# Patient Record
Sex: Male | Born: 1955 | Race: Black or African American | Hispanic: No | Marital: Single | Smoking: Former smoker
Health system: Southern US, Community
[De-identification: ages and names within clinical notes are randomized; demographics above are authoritative.]

## PROBLEM LIST (undated history)

## (undated) ENCOUNTER — Emergency Department (HOSPITAL_COMMUNITY): Admission: EM

## (undated) DIAGNOSIS — K219 Gastro-esophageal reflux disease without esophagitis: Secondary | ICD-10-CM

## (undated) DIAGNOSIS — G8929 Other chronic pain: Secondary | ICD-10-CM

## (undated) DIAGNOSIS — E785 Hyperlipidemia, unspecified: Secondary | ICD-10-CM

## (undated) DIAGNOSIS — R51 Headache: Secondary | ICD-10-CM

## (undated) DIAGNOSIS — E559 Vitamin D deficiency, unspecified: Secondary | ICD-10-CM

## (undated) DIAGNOSIS — M1990 Unspecified osteoarthritis, unspecified site: Secondary | ICD-10-CM

## (undated) DIAGNOSIS — I10 Essential (primary) hypertension: Secondary | ICD-10-CM

## (undated) DIAGNOSIS — E119 Type 2 diabetes mellitus without complications: Secondary | ICD-10-CM

## (undated) HISTORY — PX: LUMBAR FUSION: SHX111

## (undated) HISTORY — PX: BACK SURGERY: SHX140

## (undated) HISTORY — PX: ROTATOR CUFF REPAIR: SHX139

## (undated) HISTORY — DX: Vitamin D deficiency, unspecified: E55.9

## (undated) HISTORY — DX: Hyperlipidemia, unspecified: E78.5

---

## 1997-06-12 HISTORY — PX: CERVICAL DISC SURGERY: SHX588

## 1997-11-17 ENCOUNTER — Observation Stay (HOSPITAL_COMMUNITY): Admission: RE | Admit: 1997-11-17 | Discharge: 1997-11-18 | Payer: Self-pay | Admitting: Neurosurgery

## 1998-01-11 ENCOUNTER — Ambulatory Visit (HOSPITAL_COMMUNITY): Admission: RE | Admit: 1998-01-11 | Discharge: 1998-01-11 | Payer: Self-pay | Admitting: Neurosurgery

## 1998-04-30 ENCOUNTER — Encounter: Payer: Self-pay | Admitting: Neurosurgery

## 1998-04-30 ENCOUNTER — Ambulatory Visit (HOSPITAL_COMMUNITY): Admission: RE | Admit: 1998-04-30 | Discharge: 1998-04-30 | Payer: Self-pay | Admitting: Neurosurgery

## 1998-12-07 ENCOUNTER — Ambulatory Visit (HOSPITAL_COMMUNITY): Admission: RE | Admit: 1998-12-07 | Discharge: 1998-12-07 | Payer: Self-pay | Admitting: *Deleted

## 1998-12-16 ENCOUNTER — Ambulatory Visit (HOSPITAL_COMMUNITY): Admission: RE | Admit: 1998-12-16 | Discharge: 1998-12-16 | Payer: Self-pay | Admitting: *Deleted

## 1999-05-30 ENCOUNTER — Emergency Department (HOSPITAL_COMMUNITY): Admission: EM | Admit: 1999-05-30 | Discharge: 1999-05-30 | Payer: Self-pay | Admitting: Emergency Medicine

## 1999-05-31 ENCOUNTER — Encounter: Payer: Self-pay | Admitting: Emergency Medicine

## 1999-07-19 ENCOUNTER — Emergency Department (HOSPITAL_COMMUNITY): Admission: EM | Admit: 1999-07-19 | Discharge: 1999-07-19 | Payer: Self-pay | Admitting: Emergency Medicine

## 1999-10-24 ENCOUNTER — Emergency Department (HOSPITAL_COMMUNITY): Admission: EM | Admit: 1999-10-24 | Discharge: 1999-10-24 | Payer: Self-pay | Admitting: Emergency Medicine

## 2000-04-11 ENCOUNTER — Ambulatory Visit (HOSPITAL_COMMUNITY): Admission: RE | Admit: 2000-04-11 | Discharge: 2000-04-11 | Payer: Self-pay | Admitting: *Deleted

## 2000-10-06 ENCOUNTER — Emergency Department (HOSPITAL_COMMUNITY): Admission: EM | Admit: 2000-10-06 | Discharge: 2000-10-06 | Payer: Self-pay

## 2001-09-12 ENCOUNTER — Encounter: Payer: Self-pay | Admitting: Neurosurgery

## 2001-09-12 ENCOUNTER — Encounter: Admission: RE | Admit: 2001-09-12 | Discharge: 2001-09-12 | Payer: Self-pay | Admitting: Neurosurgery

## 2002-08-25 ENCOUNTER — Emergency Department (HOSPITAL_COMMUNITY): Admission: EM | Admit: 2002-08-25 | Discharge: 2002-08-25 | Payer: Self-pay | Admitting: Emergency Medicine

## 2002-12-06 ENCOUNTER — Emergency Department (HOSPITAL_COMMUNITY): Admission: EM | Admit: 2002-12-06 | Discharge: 2002-12-06 | Payer: Self-pay | Admitting: Emergency Medicine

## 2005-01-20 ENCOUNTER — Emergency Department (HOSPITAL_COMMUNITY): Admission: EM | Admit: 2005-01-20 | Discharge: 2005-01-20 | Payer: Self-pay | Admitting: Emergency Medicine

## 2006-10-03 ENCOUNTER — Emergency Department (HOSPITAL_COMMUNITY): Admission: EM | Admit: 2006-10-03 | Discharge: 2006-10-03 | Payer: Self-pay | Admitting: Family Medicine

## 2007-03-20 ENCOUNTER — Encounter: Admission: RE | Admit: 2007-03-20 | Discharge: 2007-03-20 | Payer: Self-pay | Admitting: Nephrology

## 2007-08-29 ENCOUNTER — Emergency Department (HOSPITAL_COMMUNITY): Admission: EM | Admit: 2007-08-29 | Discharge: 2007-08-29 | Payer: Self-pay | Admitting: Family Medicine

## 2008-05-23 ENCOUNTER — Emergency Department (HOSPITAL_COMMUNITY): Admission: EM | Admit: 2008-05-23 | Discharge: 2008-05-23 | Payer: Self-pay | Admitting: Emergency Medicine

## 2008-06-12 ENCOUNTER — Emergency Department (HOSPITAL_COMMUNITY): Admission: EM | Admit: 2008-06-12 | Discharge: 2008-06-12 | Payer: Self-pay | Admitting: Emergency Medicine

## 2009-06-30 ENCOUNTER — Encounter: Admission: RE | Admit: 2009-06-30 | Discharge: 2009-06-30 | Payer: Self-pay | Admitting: Nephrology

## 2010-04-07 ENCOUNTER — Emergency Department (HOSPITAL_COMMUNITY): Admission: EM | Admit: 2010-04-07 | Discharge: 2010-04-07 | Payer: Self-pay | Admitting: Emergency Medicine

## 2010-08-09 ENCOUNTER — Ambulatory Visit
Admission: RE | Admit: 2010-08-09 | Discharge: 2010-08-09 | Disposition: A | Discharge status: Home / Self Care | Payer: Medicare Other | Source: Ambulatory Visit | Attending: Nephrology | Admitting: Nephrology

## 2010-08-09 ENCOUNTER — Other Ambulatory Visit: Payer: Self-pay | Admitting: Nephrology

## 2010-08-09 DIAGNOSIS — F17200 Nicotine dependence, unspecified, uncomplicated: Secondary | ICD-10-CM

## 2011-03-07 ENCOUNTER — Ambulatory Visit
Admission: RE | Admit: 2011-03-07 | Discharge: 2011-03-07 | Disposition: A | Discharge status: Home / Self Care | Payer: Medicare Other | Source: Ambulatory Visit | Attending: Nephrology | Admitting: Nephrology

## 2011-03-07 ENCOUNTER — Other Ambulatory Visit: Payer: Self-pay | Admitting: Nephrology

## 2011-03-07 DIAGNOSIS — R52 Pain, unspecified: Secondary | ICD-10-CM

## 2011-10-31 ENCOUNTER — Other Ambulatory Visit: Payer: Self-pay | Admitting: Nephrology

## 2011-10-31 ENCOUNTER — Ambulatory Visit
Admission: RE | Admit: 2011-10-31 | Discharge: 2011-10-31 | Disposition: A | Discharge status: Home / Self Care | Payer: Medicare Other | Source: Ambulatory Visit | Attending: Nephrology | Admitting: Nephrology

## 2011-10-31 DIAGNOSIS — I10 Essential (primary) hypertension: Secondary | ICD-10-CM

## 2012-04-11 ENCOUNTER — Other Ambulatory Visit: Payer: Self-pay | Admitting: Neurosurgery

## 2012-04-11 DIAGNOSIS — M48061 Spinal stenosis, lumbar region without neurogenic claudication: Secondary | ICD-10-CM

## 2012-04-18 ENCOUNTER — Ambulatory Visit
Admission: RE | Admit: 2012-04-18 | Discharge: 2012-04-18 | Disposition: A | Discharge status: Home / Self Care | Payer: Medicare Other | Source: Ambulatory Visit | Attending: Neurosurgery | Admitting: Neurosurgery

## 2012-04-18 DIAGNOSIS — M48061 Spinal stenosis, lumbar region without neurogenic claudication: Secondary | ICD-10-CM

## 2012-08-07 ENCOUNTER — Other Ambulatory Visit: Payer: Self-pay | Admitting: Neurosurgery

## 2012-08-07 ENCOUNTER — Encounter (HOSPITAL_COMMUNITY): Payer: Self-pay | Admitting: Pharmacy Technician

## 2012-08-12 ENCOUNTER — Encounter (HOSPITAL_COMMUNITY): Payer: Self-pay

## 2012-08-12 ENCOUNTER — Encounter (HOSPITAL_COMMUNITY): Payer: Self-pay | Admitting: Vascular Surgery

## 2012-08-12 ENCOUNTER — Encounter (HOSPITAL_COMMUNITY)
Admission: RE | Admit: 2012-08-12 | Discharge: 2012-08-12 | Disposition: A | Discharge status: Home / Self Care | Payer: Medicare Other | Source: Ambulatory Visit | Attending: Neurosurgery | Admitting: Neurosurgery

## 2012-08-12 DIAGNOSIS — Z01810 Encounter for preprocedural cardiovascular examination: Secondary | ICD-10-CM | POA: Insufficient documentation

## 2012-08-12 DIAGNOSIS — Z01818 Encounter for other preprocedural examination: Secondary | ICD-10-CM | POA: Insufficient documentation

## 2012-08-12 DIAGNOSIS — Z01812 Encounter for preprocedural laboratory examination: Secondary | ICD-10-CM | POA: Insufficient documentation

## 2012-08-12 HISTORY — DX: Essential (primary) hypertension: I10

## 2012-08-12 HISTORY — DX: Unspecified osteoarthritis, unspecified site: M19.90

## 2012-08-12 HISTORY — DX: Type 2 diabetes mellitus without complications: E11.9

## 2012-08-12 HISTORY — DX: Headache: R51

## 2012-08-12 LAB — CBC WITH DIFFERENTIAL/PLATELET
Basophils Absolute: 0 10*3/uL (ref 0.0–0.1)
Basophils Relative: 1 % (ref 0–1)
Eosinophils Absolute: 0.2 10*3/uL (ref 0.0–0.7)
Eosinophils Relative: 3 % (ref 0–5)
HCT: 43 % (ref 39.0–52.0)
Hemoglobin: 14.8 g/dL (ref 13.0–17.0)
Lymphocytes Relative: 23 % (ref 12–46)
Lymphs Abs: 1.7 10*3/uL (ref 0.7–4.0)
MCH: 29.9 pg (ref 26.0–34.0)
MCHC: 34.4 g/dL (ref 30.0–36.0)
MCV: 86.9 fL (ref 78.0–100.0)
Monocytes Absolute: 0.6 10*3/uL (ref 0.1–1.0)
Monocytes Relative: 8 % (ref 3–12)
Neutro Abs: 4.9 10*3/uL (ref 1.7–7.7)
Neutrophils Relative %: 66 % (ref 43–77)
Platelets: 164 10*3/uL (ref 150–400)
RBC: 4.95 MIL/uL (ref 4.22–5.81)
RDW: 12.7 % (ref 11.5–15.5)
WBC: 7.3 10*3/uL (ref 4.0–10.5)

## 2012-08-12 LAB — BASIC METABOLIC PANEL
BUN: 13 mg/dL (ref 6–23)
CO2: 29 mEq/L (ref 19–32)
Calcium: 9.7 mg/dL (ref 8.4–10.5)
Chloride: 94 mEq/L — ABNORMAL LOW (ref 96–112)
Creatinine, Ser: 0.92 mg/dL (ref 0.50–1.35)
GFR calc Af Amer: >90 mL/min (ref >90)
GFR calc non Af Amer: >90 mL/min (ref >90)
Glucose, Bld: 338 mg/dL — ABNORMAL HIGH (ref 70–99)
Potassium: 3.5 mEq/L (ref 3.5–5.1)
Sodium: 135 mEq/L (ref 135–145)

## 2012-08-12 LAB — SURGICAL PCR SCREEN
MRSA, PCR: NEGATIVE
Staphylococcus aureus: NEGATIVE

## 2012-08-12 MED ORDER — DEXAMETHASONE SODIUM PHOSPHATE 10 MG/ML IJ SOLN: 10.0000 mg | INTRAMUSCULAR | Status: DC

## 2012-08-12 MED ORDER — CEFAZOLIN SODIUM-DEXTROSE 2-3 GM-% IV SOLR: 2.0000 g | INTRAVENOUS | Status: DC

## 2012-08-12 NOTE — Pre-Procedure Instructions (Addendum)
Riley Clarke  08/12/2012   Your procedure is scheduled on:  Tuesday August 13, 2012  Report to [facility] Stay [facility] at 5:30AM.  Call this number if you have problems the morning of surgery: [phone]   Remember:   Do not eat food or drink liquids after midnight.   Take these medicines the morning of surgery with A SIP OF WATER: nexium, tramadol   Do not wear jewelry, make-up or nail polish.  Do not wear lotions, powders, or perfumes.  Do not shave 48 hours prior to surgery. Men may shave face and neck.  Do not bring valuables to the hospital.  Contacts, dentures or bridgework may not be worn into surgery.  Leave suitcase in the car. After surgery it may be brought to your room.  For patients admitted to the hospital, checkout time is 11:00 AM the day of  discharge.   Patients discharged the day of surgery will not be allowed to drive  home.  Name and phone number of your driver: family / friend  Special Instructions: Shower using CHG 2 nights before surgery and the night before surgery.  If you shower the day of surgery use CHG.  Use special wash - you have one bottle of CHG for all showers.  You should use approximately 1/3 of the bottle for each shower.   Please read over the following fact sheets that you were given: Pain Booklet, Coughing and Deep Breathing, Blood Transfusion Information, MRSA Information and Surgical Site Infection Prevention

## 2012-08-12 NOTE — Progress Notes (Signed)
Anesthesia Chart Review:  Patient is a 57 year old male posted for a one level PLIF by Dr. Jordan Likes on 08/13/12.  His PAT appointment was earlier today.  (Procedure was posted last week.)  I was not asked to evaluate patient at his PAT visit, but was given his EKG to review after he had already left his PAT visit.  History includes HTN, DM2, obesity, smoking, arthritis, cervical disk surgery.  PCP is Dr. Jeri Cos, whose office is closed for the day.    Preoperative labs noted.  Non-fasting glucose is elevated at 338.  Cr 0.92.  CBC WNL.  T&S is in process.  CXR on 10/31/11 showed no acute cardiopulmonary findings.  EKG on 08/12/12 showed NSR, inferior T wave abnormality, consider ischemia--which was confirmed by the reading cardiologist Dr. Mayford Knife.  (He is not followed by a cardiologist, however.) Currently there are no comparison EKGs available in Epic or Muse.  His PCP office is closed today.  I reviewed history and EKG with Anesthesiologist Dr. Michelle Piper.  Recommend cardiology evaluation pre-operatively due to abnormal EKG and multiple risk factors for CAD.  I have notified Erie Noe at [facility].  I also told her his glucose was > 300 today.  She will let me know if she finds out any additional information, otherwise she will arrange a pre-operative cardiology evaluation.  Shonna Chock, PA-C 08/12/12 1409

## 2012-08-13 ENCOUNTER — Encounter (HOSPITAL_COMMUNITY): Admission: RE | Payer: Self-pay | Source: Ambulatory Visit

## 2012-08-13 ENCOUNTER — Inpatient Hospital Stay (HOSPITAL_COMMUNITY): Admission: RE | Admit: 2012-08-13 | Payer: Medicare Other | Source: Ambulatory Visit | Admitting: Neurosurgery

## 2012-08-13 SURGERY — POSTERIOR LUMBAR FUSION 1 LEVEL
Anesthesia: General | Site: Back

## 2012-08-16 LAB — TYPE AND SCREEN
ABO/RH(D): AB POS
Antibody Screen: POSITIVE
DAT, IgG: NEGATIVE
Donor AG Type: NEGATIVE
Donor AG Type: NEGATIVE
PT AG Type: NEGATIVE
Unit division: 0
Unit division: 0

## 2012-08-21 ENCOUNTER — Other Ambulatory Visit (HOSPITAL_COMMUNITY): Payer: Self-pay | Admitting: Cardiovascular Disease

## 2012-08-21 DIAGNOSIS — Z01818 Encounter for other preprocedural examination: Secondary | ICD-10-CM

## 2012-08-23 ENCOUNTER — Ambulatory Visit (HOSPITAL_COMMUNITY)
Admission: RE | Admit: 2012-08-23 | Discharge: 2012-08-23 | Disposition: A | Discharge status: Home / Self Care | Payer: Medicare Other | Source: Ambulatory Visit | Attending: Cardiovascular Disease | Admitting: Cardiovascular Disease

## 2012-08-23 DIAGNOSIS — Z01818 Encounter for other preprocedural examination: Secondary | ICD-10-CM

## 2012-08-23 DIAGNOSIS — Z01810 Encounter for preprocedural cardiovascular examination: Secondary | ICD-10-CM | POA: Insufficient documentation

## 2012-08-23 MED ORDER — TECHNETIUM TC 99M SESTAMIBI GENERIC - CARDIOLITE
10.0000 | Freq: Once | INTRAVENOUS | Status: AC | PRN
  Administered 2012-08-23: 10 via INTRAVENOUS

## 2012-08-23 MED ORDER — TECHNETIUM TC 99M SESTAMIBI GENERIC - CARDIOLITE
30.0000 | Freq: Once | INTRAVENOUS | Status: AC | PRN
  Administered 2012-08-23: 30 via INTRAVENOUS

## 2012-08-23 MED ORDER — REGADENOSON 0.4 MG/5ML IV SOLN
0.4000 mg | Freq: Once | INTRAVENOUS | Status: AC
  Administered 2012-08-23: 0.4 mg via INTRAVENOUS

## 2012-08-23 NOTE — Procedures (Addendum)
[facility] [address] [place] [address] [phone]  Cardiology Nuclear Med Study  Riley Clarke is a 57 y.o. male     MRN : [id number]     DOB: 30-Dec-1955  Procedure Date: 08/23/2012  Nuclear Med Background Indication for Stress Test:  Surgical Clearance History:  NO PRIOR CARDIAC HISTORY Cardiac Risk Factors: Family History - CAD, Hypertension, NIDDM, Overweight and Smoker  Symptoms:  Dizziness and Light-Headedness   Nuclear Pre-Procedure Caffeine/Decaff Intake:  12:00am NPO After: 10 AM   IV Site: R Hand  IV 0.9% NS with Angio Cath:  22g  Chest Size (in):  46" IV Started by: Emmit Pomfret, RN  Height: 6\' 1"  (1.854 m)  Cup Size: n/a  BMI:  Body mass index is 32.2 kg/(m^2). Weight:  244 lb (110.678 kg)   Tech Comments:  N/A    Nuclear Med Study 1 or 2 day study: 1 day  Stress Test Type:  Lexiscan  Order Authorizing Provider:  Obie Dredge   Resting Radionuclide: Technetium 4m Sestamibi  Resting Radionuclide Dose: 10.9 mCi   Stress Radionuclide:  Technetium 37m Sestamibi  Stress Radionuclide Dose: 30.8 mCi           Stress Protocol Rest HR: 70 Stress HR:105  Rest BP: 144/91 Stress BP: 127/71  Exercise Time (min): n/a METS: n/a          Dose of Adenosine (mg):  n/a Dose of Lexiscan: 0.4 mg  Dose of Atropine (mg): n/a Dose of Dobutamine: n/a mcg/kg/min (at max HR)  Stress Test Technologist: Ernestene Mention, CCT Nuclear Technologist: Koren Shiver, CNMT   Rest Procedure:  Myocardial perfusion imaging was performed at rest 45 minutes following the intravenous administration of Technetium 53m Sestamibi. Stress Procedure:  The patient received IV Lexiscan 0.4 mg over 15-seconds.  Technetium 50m Sestamibi injected at 30-seconds.  There were no significant changes with Lexiscan.  Quantitative spect images were obtained after a 45 minute delay.  Transient Ischemic Dilatation (Normal <1.22):   1.20 Lung/Heart Ratio (Normal <0.45):  0.31 QGS EDV:  81 ml QGS ESV:  32 ml LV Ejection Fraction: 61%  Rest ECG: NSR - Normal EKG  Stress ECG: No significant change from baseline ECG  QPS Raw Data Images:  There is interference from nuclear activity from structures below the diaphragm. This does not affect the ability to read the study. Stress Images:  Normal homogeneous uptake in all areas of the myocardium. Rest Images:  Normal homogeneous uptake in all areas of the myocardium. Subtraction (SDS):  No evidence of ischemia.  Impression Exercise Capacity:  Lexiscan with no exercise. BP Response:  Hypotensive blood pressure response. Clinical Symptoms:  Mild dyspnea ECG Impression:  No significant ECG changes with Lexiscan. Comparison with Prior Nuclear Study: No previous nuclear study performed  Overall Impression:  Normal stress nuclear study.  LV Wall Motion:  NL LV Function; NL Wall Motion  Chrystie Nose, MD, [facility] Board Certified in Nuclear Cardiology Attending Cardiologist [facility]  Chrystie Nose, MD  08/24/2012 2:41 PM

## 2012-09-02 ENCOUNTER — Other Ambulatory Visit: Payer: Self-pay | Admitting: Neurosurgery

## 2012-09-02 ENCOUNTER — Encounter (HOSPITAL_COMMUNITY): Payer: Self-pay | Admitting: Pharmacy Technician

## 2012-09-05 NOTE — Progress Notes (Signed)
Anesthesia Note: See my note from 08/12/12.  Since then patient's PLIF has been rescheduled for 09/10/12.  He has seen cardiologist Dr. Allyson Sabal [facility]) who ordered a stress test preoperatively.  He felt patient could have PRN cardiology follow-up and be cleared for surgery if it was low risk--which it was.  Nuclear stress test on 08/25/11 showed: Normal stress nuclear study. LV Wall Motion: NL LV Function; EF 61%. NL Wall Motion.  Preoperative labs are pending his PAT appointment on 09/06/12.  Velna Ochs [facility]/Anesthesiology Phone [phone] 09/05/2012 2:06 PM

## 2012-09-06 ENCOUNTER — Institutional Professional Consult (permissible substitution): Payer: Medicare Other | Admitting: Cardiology

## 2012-09-06 ENCOUNTER — Encounter (HOSPITAL_COMMUNITY): Payer: Self-pay

## 2012-09-06 ENCOUNTER — Encounter (HOSPITAL_COMMUNITY)
Admission: RE | Admit: 2012-09-06 | Discharge: 2012-09-06 | Disposition: A | Discharge status: Home / Self Care | Payer: Medicare Other | Source: Ambulatory Visit | Attending: Neurosurgery | Admitting: Neurosurgery

## 2012-09-06 HISTORY — DX: Gastro-esophageal reflux disease without esophagitis: K21.9

## 2012-09-06 LAB — BASIC METABOLIC PANEL
BUN: 14 mg/dL (ref 6–23)
CO2: 22 mEq/L (ref 19–32)
Calcium: 9.2 mg/dL (ref 8.4–10.5)
Chloride: 95 mEq/L — ABNORMAL LOW (ref 96–112)
Creatinine, Ser: 0.74 mg/dL (ref 0.50–1.35)
GFR calc Af Amer: >90 mL/min (ref >90)
GFR calc non Af Amer: >90 mL/min (ref >90)
Glucose, Bld: 380 mg/dL — ABNORMAL HIGH (ref 70–99)
Potassium: 3.9 mEq/L (ref 3.5–5.1)
Sodium: 131 mEq/L — ABNORMAL LOW (ref 135–145)

## 2012-09-06 LAB — CBC WITH DIFFERENTIAL/PLATELET
Basophils Absolute: 0 10*3/uL (ref 0.0–0.1)
Basophils Relative: 0 % (ref 0–1)
Eosinophils Absolute: 0.2 10*3/uL (ref 0.0–0.7)
Eosinophils Relative: 2 % (ref 0–5)
HCT: 41.7 % (ref 39.0–52.0)
Hemoglobin: 14.6 g/dL (ref 13.0–17.0)
Lymphocytes Relative: 24 % (ref 12–46)
Lymphs Abs: 2 10*3/uL (ref 0.7–4.0)
MCH: 30.5 pg (ref 26.0–34.0)
MCHC: 35 g/dL (ref 30.0–36.0)
MCV: 87.1 fL (ref 78.0–100.0)
Monocytes Absolute: 0.5 10*3/uL (ref 0.1–1.0)
Monocytes Relative: 6 % (ref 3–12)
Neutro Abs: 5.9 10*3/uL (ref 1.7–7.7)
Neutrophils Relative %: 68 % (ref 43–77)
Platelets: 177 10*3/uL (ref 150–400)
RBC: 4.79 MIL/uL (ref 4.22–5.81)
RDW: 12.4 % (ref 11.5–15.5)
WBC: 8.7 10*3/uL (ref 4.0–10.5)

## 2012-09-06 NOTE — Progress Notes (Signed)
Primary Physician - Dr. Jeri Cos Cardiologist - Dr. Allyson Sabal EKG and stress test in chart

## 2012-09-06 NOTE — Pre-Procedure Instructions (Signed)
Fount Bahe Maricle  09/06/2012   Your procedure is scheduled on:  Tuesday, April 1st  Report to [facility] at 0845 AM.  Call this number if you have problems the morning of surgery: [phone]   Remember:   Do not eat food or drink liquids after midnight.    Take these medicines the morning of surgery with A SIP OF WATER: Nexium, Ultram if needed   Do not wear jewelry.  Do not wear lotions, powders, or perfumes,deodorant.  Do not shave 48 hours prior to surgery. Men may shave face and neck.   Do not bring valuables to the hospital.  Contacts, dentures or bridgework may not be worn into surgery.  Leave suitcase in the car. After surgery it may be brought to your room.  For patients admitted to the hospital, checkout time is 11:00 AM the day of discharge.   Patients discharged the day of surgery will not be allowed to drive home.   Special Instructions: Shower using CHG 2 nights before surgery and the night before surgery.  If you shower the day of surgery use CHG.  Use special wash - you have one bottle of CHG for all showers.  You should use approximately 1/3 of the bottle for each shower.   Please read over the following fact sheets that you were given: Pain Booklet, Coughing and Deep Breathing, Blood Transfusion Information, MRSA Information and Surgical Site Infection Prevention

## 2012-09-09 MED ORDER — CEFAZOLIN SODIUM-DEXTROSE 2-3 GM-% IV SOLR
2.0000 g | INTRAVENOUS | Status: AC
  Administered 2012-09-10: 2 g via INTRAVENOUS

## 2012-09-10 ENCOUNTER — Encounter (HOSPITAL_COMMUNITY): Payer: Self-pay | Admitting: Anesthesiology

## 2012-09-10 ENCOUNTER — Inpatient Hospital Stay (HOSPITAL_COMMUNITY)
Admission: RE | Admit: 2012-09-10 | Discharge: 2012-09-12 | DRG: 460 | Disposition: A | Discharge status: Home / Self Care | Payer: Medicare Other | Source: Ambulatory Visit | Attending: Neurosurgery | Admitting: Neurosurgery

## 2012-09-10 ENCOUNTER — Encounter (HOSPITAL_COMMUNITY): Payer: Self-pay | Admitting: Vascular Surgery

## 2012-09-10 ENCOUNTER — Encounter (HOSPITAL_COMMUNITY)
Admission: RE | Disposition: A | Discharge status: Home / Self Care | Payer: Self-pay | Source: Ambulatory Visit | Attending: Neurosurgery

## 2012-09-10 ENCOUNTER — Inpatient Hospital Stay (HOSPITAL_COMMUNITY): Payer: Medicare Other

## 2012-09-10 ENCOUNTER — Inpatient Hospital Stay (HOSPITAL_COMMUNITY): Payer: Medicare Other | Admitting: Anesthesiology

## 2012-09-10 DIAGNOSIS — I10 Essential (primary) hypertension: Secondary | ICD-10-CM | POA: Diagnosis present

## 2012-09-10 DIAGNOSIS — K219 Gastro-esophageal reflux disease without esophagitis: Secondary | ICD-10-CM | POA: Diagnosis present

## 2012-09-10 DIAGNOSIS — Z01812 Encounter for preprocedural laboratory examination: Secondary | ICD-10-CM

## 2012-09-10 DIAGNOSIS — F17200 Nicotine dependence, unspecified, uncomplicated: Secondary | ICD-10-CM | POA: Diagnosis present

## 2012-09-10 DIAGNOSIS — M48061 Spinal stenosis, lumbar region without neurogenic claudication: Secondary | ICD-10-CM | POA: Diagnosis present

## 2012-09-10 DIAGNOSIS — Z79899 Other long term (current) drug therapy: Secondary | ICD-10-CM

## 2012-09-10 DIAGNOSIS — E119 Type 2 diabetes mellitus without complications: Secondary | ICD-10-CM | POA: Diagnosis present

## 2012-09-10 DIAGNOSIS — M48062 Spinal stenosis, lumbar region with neurogenic claudication: Principal | ICD-10-CM | POA: Diagnosis present

## 2012-09-10 LAB — GLUCOSE, CAPILLARY
Glucose-Capillary: 281 mg/dL — ABNORMAL HIGH (ref 70–99)
Glucose-Capillary: 280 mg/dL — ABNORMAL HIGH (ref 70–99)
Glucose-Capillary: 270 mg/dL — ABNORMAL HIGH (ref 70–99)
Glucose-Capillary: 229 mg/dL — ABNORMAL HIGH (ref 70–99)

## 2012-09-10 SURGERY — POSTERIOR LUMBAR FUSION 2 LEVEL
Anesthesia: General | Site: Back | Wound class: Clean

## 2012-09-10 MED ORDER — HYDROCHLOROTHIAZIDE 25 MG PO TABS
25.0000 mg | ORAL_TABLET | Freq: Every day | ORAL | Status: DC
Start: 2012-09-10 — End: 2012-09-12
  Administered 2012-09-10 – 2012-09-12 (×3): 25 mg via ORAL
  Filled 2012-09-10 (×3): qty 1

## 2012-09-10 MED ORDER — HYDROMORPHONE HCL PF 1 MG/ML IJ SOLN
INTRAMUSCULAR | Status: AC
  Filled 2012-09-10: qty 1

## 2012-09-10 MED ORDER — DEXAMETHASONE SODIUM PHOSPHATE 10 MG/ML IJ SOLN: 10.0000 mg | INTRAMUSCULAR | Status: DC

## 2012-09-10 MED ORDER — HYDROCODONE-ACETAMINOPHEN 5-325 MG PO TABS: 1.0000 | ORAL_TABLET | ORAL | Status: DC | PRN

## 2012-09-10 MED ORDER — BUPIVACAINE HCL (PF) 0.25 % IJ SOLN
INTRAMUSCULAR | Status: DC | PRN
  Administered 2012-09-10: 30 mL

## 2012-09-10 MED ORDER — MENTHOL 3 MG MT LOZG: 1.0000 | LOZENGE | OROMUCOSAL | Status: DC | PRN

## 2012-09-10 MED ORDER — ROCURONIUM BROMIDE 100 MG/10ML IV SOLN
INTRAVENOUS | Status: DC | PRN
  Administered 2012-09-10: 50 mg via INTRAVENOUS

## 2012-09-10 MED ORDER — CEFAZOLIN SODIUM-DEXTROSE 2-3 GM-% IV SOLR
INTRAVENOUS | Status: AC
  Filled 2012-09-10: qty 50

## 2012-09-10 MED ORDER — DEXAMETHASONE SODIUM PHOSPHATE 10 MG/ML IJ SOLN
INTRAMUSCULAR | Status: AC
  Filled 2012-09-10: qty 1

## 2012-09-10 MED ORDER — POLYETHYLENE GLYCOL 3350 17 G PO PACK
17.0000 g | PACK | Freq: Every day | ORAL | Status: DC | PRN
  Filled 2012-09-10: qty 1

## 2012-09-10 MED ORDER — HYDROMORPHONE HCL PF 1 MG/ML IJ SOLN
0.5000 mg | INTRAMUSCULAR | Status: DC | PRN
  Administered 2012-09-10: 1 mg via INTRAVENOUS
  Filled 2012-09-10: qty 1

## 2012-09-10 MED ORDER — FENTANYL CITRATE 0.05 MG/ML IJ SOLN
INTRAMUSCULAR | Status: DC | PRN
  Administered 2012-09-10 (×6): 50 ug via INTRAVENOUS
  Administered 2012-09-10: 100 ug via INTRAVENOUS
  Administered 2012-09-10: 150 ug via INTRAVENOUS

## 2012-09-10 MED ORDER — LOSARTAN POTASSIUM-HCTZ 100-25 MG PO TABS: 1.0000 | ORAL_TABLET | Freq: Every day | ORAL | Status: DC

## 2012-09-10 MED ORDER — INSULIN ASPART 100 UNIT/ML ~~LOC~~ SOLN
0.0000 [IU] | Freq: Three times a day (TID) | SUBCUTANEOUS | Status: DC
  Administered 2012-09-10: 11 [IU] via SUBCUTANEOUS
  Administered 2012-09-11: 7 [IU] via SUBCUTANEOUS
  Administered 2012-09-11: 15 [IU] via SUBCUTANEOUS
  Administered 2012-09-11 – 2012-09-12 (×3): 11 [IU] via SUBCUTANEOUS

## 2012-09-10 MED ORDER — DIAZEPAM 5 MG PO TABS
5.0000 mg | ORAL_TABLET | Freq: Four times a day (QID) | ORAL | Status: DC | PRN
  Administered 2012-09-10 – 2012-09-12 (×5): 10 mg via ORAL
  Administered 2012-09-12: 5 mg via ORAL
  Filled 2012-09-10 (×3): qty 2
  Filled 2012-09-10: qty 1
  Filled 2012-09-10 (×2): qty 2

## 2012-09-10 MED ORDER — ACETAMINOPHEN 325 MG PO TABS: 650.0000 mg | ORAL_TABLET | ORAL | Status: DC | PRN

## 2012-09-10 MED ORDER — LACTATED RINGERS IV SOLN
INTRAVENOUS | Status: DC | PRN
  Administered 2012-09-10 (×3): via INTRAVENOUS

## 2012-09-10 MED ORDER — SODIUM CHLORIDE 0.9 % IV SOLN
INTRAVENOUS | Status: AC
  Filled 2012-09-10: qty 500

## 2012-09-10 MED ORDER — ONDANSETRON HCL 4 MG/2ML IJ SOLN: 4.0000 mg | Freq: Once | INTRAMUSCULAR | Status: DC | PRN

## 2012-09-10 MED ORDER — ALUM & MAG HYDROXIDE-SIMETH 200-200-20 MG/5ML PO SUSP: 30.0000 mL | Freq: Four times a day (QID) | ORAL | Status: DC | PRN

## 2012-09-10 MED ORDER — MEPERIDINE HCL 25 MG/ML IJ SOLN: 6.2500 mg | INTRAMUSCULAR | Status: DC | PRN

## 2012-09-10 MED ORDER — BISACODYL 10 MG RE SUPP: 10.0000 mg | Freq: Every day | RECTAL | Status: DC | PRN

## 2012-09-10 MED ORDER — ONDANSETRON HCL 4 MG/2ML IJ SOLN: 4.0000 mg | INTRAMUSCULAR | Status: DC | PRN

## 2012-09-10 MED ORDER — INSULIN ASPART 100 UNIT/ML ~~LOC~~ SOLN: 0.0000 [IU] | Freq: Three times a day (TID) | SUBCUTANEOUS | Status: DC

## 2012-09-10 MED ORDER — 0.9 % SODIUM CHLORIDE (POUR BTL) OPTIME
TOPICAL | Status: DC | PRN
  Administered 2012-09-10: 1000 mL

## 2012-09-10 MED ORDER — OXYCODONE HCL 5 MG/5ML PO SOLN: 5.0000 mg | Freq: Once | ORAL | Status: DC | PRN

## 2012-09-10 MED ORDER — FLEET ENEMA 7-19 GM/118ML RE ENEM
1.0000 | ENEMA | Freq: Once | RECTAL | Status: AC | PRN
  Filled 2012-09-10: qty 1

## 2012-09-10 MED ORDER — LIDOCAINE HCL (CARDIAC) 20 MG/ML IV SOLN
INTRAVENOUS | Status: DC | PRN
  Administered 2012-09-10: 100 mg via INTRAVENOUS

## 2012-09-10 MED ORDER — HYDROMORPHONE HCL PF 1 MG/ML IJ SOLN
0.2500 mg | INTRAMUSCULAR | Status: DC | PRN
  Administered 2012-09-10 (×4): 0.5 mg via INTRAVENOUS

## 2012-09-10 MED ORDER — OXYCODONE-ACETAMINOPHEN 5-325 MG PO TABS
1.0000 | ORAL_TABLET | ORAL | Status: DC | PRN
  Administered 2012-09-10 – 2012-09-12 (×9): 2 via ORAL
  Filled 2012-09-10 (×9): qty 2

## 2012-09-10 MED ORDER — LOSARTAN POTASSIUM 50 MG PO TABS
100.0000 mg | ORAL_TABLET | Freq: Every day | ORAL | Status: DC
  Administered 2012-09-10 – 2012-09-12 (×3): 100 mg via ORAL
  Filled 2012-09-10 (×3): qty 2

## 2012-09-10 MED ORDER — PHENOL 1.4 % MT LIQD: 1.0000 | OROMUCOSAL | Status: DC | PRN

## 2012-09-10 MED ORDER — THROMBIN 20000 UNITS EX SOLR
CUTANEOUS | Status: DC | PRN
  Administered 2012-09-10: 13:00:00 via TOPICAL

## 2012-09-10 MED ORDER — PROPOFOL 10 MG/ML IV BOLUS
INTRAVENOUS | Status: DC | PRN
  Administered 2012-09-10: 200 mg via INTRAVENOUS
  Administered 2012-09-10: 50 mg via INTRAVENOUS

## 2012-09-10 MED ORDER — ARTIFICIAL TEARS OP OINT
TOPICAL_OINTMENT | OPHTHALMIC | Status: DC | PRN
  Administered 2012-09-10: 1 via OPHTHALMIC

## 2012-09-10 MED ORDER — PANTOPRAZOLE SODIUM 40 MG PO TBEC
80.0000 mg | DELAYED_RELEASE_TABLET | Freq: Every day | ORAL | Status: DC
  Administered 2012-09-11: 80 mg via ORAL
  Filled 2012-09-10: qty 2

## 2012-09-10 MED ORDER — BACITRACIN 50000 UNITS IM SOLR
INTRAMUSCULAR | Status: AC
  Filled 2012-09-10: qty 1

## 2012-09-10 MED ORDER — ACETAMINOPHEN 650 MG RE SUPP: 650.0000 mg | RECTAL | Status: DC | PRN

## 2012-09-10 MED ORDER — ACETAMINOPHEN 10 MG/ML IV SOLN
1000.0000 mg | Freq: Once | INTRAVENOUS | Status: AC
  Administered 2012-09-10: 1000 mg via INTRAVENOUS

## 2012-09-10 MED ORDER — NEOSTIGMINE METHYLSULFATE 1 MG/ML IJ SOLN
INTRAMUSCULAR | Status: DC | PRN
  Administered 2012-09-10: 4 mg via INTRAVENOUS

## 2012-09-10 MED ORDER — SODIUM CHLORIDE 0.9 % IJ SOLN: 3.0000 mL | INTRAMUSCULAR | Status: DC | PRN

## 2012-09-10 MED ORDER — ACETAMINOPHEN 10 MG/ML IV SOLN
INTRAVENOUS | Status: AC
  Filled 2012-09-10: qty 100

## 2012-09-10 MED ORDER — LINAGLIPTIN 5 MG PO TABS
5.0000 mg | ORAL_TABLET | Freq: Every day | ORAL | Status: DC
  Administered 2012-09-10 – 2012-09-12 (×3): 5 mg via ORAL
  Filled 2012-09-10 (×3): qty 1

## 2012-09-10 MED ORDER — SENNA 8.6 MG PO TABS
1.0000 | ORAL_TABLET | Freq: Two times a day (BID) | ORAL | Status: DC
  Administered 2012-09-10 – 2012-09-12 (×4): 8.6 mg via ORAL
  Filled 2012-09-10 (×5): qty 1

## 2012-09-10 MED ORDER — GLYCOPYRROLATE 0.2 MG/ML IJ SOLN
INTRAMUSCULAR | Status: DC | PRN
  Administered 2012-09-10: .7 mg via INTRAVENOUS

## 2012-09-10 MED ORDER — ZOLPIDEM TARTRATE 5 MG PO TABS: 5.0000 mg | ORAL_TABLET | Freq: Every evening | ORAL | Status: DC | PRN

## 2012-09-10 MED ORDER — CEFAZOLIN SODIUM 1-5 GM-% IV SOLN
1.0000 g | Freq: Three times a day (TID) | INTRAVENOUS | Status: AC
  Administered 2012-09-10 – 2012-09-11 (×2): 1 g via INTRAVENOUS
  Filled 2012-09-10 (×2): qty 50

## 2012-09-10 MED ORDER — SODIUM CHLORIDE 0.9 % IJ SOLN
3.0000 mL | Freq: Two times a day (BID) | INTRAMUSCULAR | Status: DC
  Administered 2012-09-11 – 2012-09-12 (×3): 3 mL via INTRAVENOUS

## 2012-09-10 MED ORDER — OXYCODONE HCL 5 MG PO TABS: 5.0000 mg | ORAL_TABLET | Freq: Once | ORAL | Status: DC | PRN

## 2012-09-10 MED ORDER — SODIUM CHLORIDE 0.9 % IR SOLN
Status: DC | PRN
  Administered 2012-09-10: 13:00:00

## 2012-09-10 MED ORDER — VECURONIUM BROMIDE 10 MG IV SOLR
INTRAVENOUS | Status: DC | PRN
  Administered 2012-09-10: 4 mg via INTRAVENOUS
  Administered 2012-09-10: 2 mg via INTRAVENOUS
  Administered 2012-09-10: 3 mg via INTRAVENOUS
  Administered 2012-09-10: 2 mg via INTRAVENOUS

## 2012-09-10 MED ORDER — SULINDAC 200 MG PO TABS
200.0000 mg | ORAL_TABLET | Freq: Two times a day (BID) | ORAL | Status: DC
  Administered 2012-09-10 – 2012-09-12 (×4): 200 mg via ORAL
  Filled 2012-09-10 (×5): qty 1

## 2012-09-10 MED ORDER — ONDANSETRON HCL 4 MG/2ML IJ SOLN
INTRAMUSCULAR | Status: DC | PRN
  Administered 2012-09-10: 4 mg via INTRAVENOUS

## 2012-09-10 MED ORDER — MIDAZOLAM HCL 5 MG/5ML IJ SOLN
INTRAMUSCULAR | Status: DC | PRN
  Administered 2012-09-10: 2 mg via INTRAVENOUS

## 2012-09-10 SURGICAL SUPPLY — 69 items
ADH SKN CLS APL DERMABOND .7 (GAUZE/BANDAGES/DRESSINGS) ×1
APL SKNCLS STERI-STRIP NONHPOA (GAUZE/BANDAGES/DRESSINGS) ×1
BAG DECANTER FOR FLEXI CONT (MISCELLANEOUS) ×2 IMPLANT
BENZOIN TINCTURE PRP APPL 2/3 (GAUZE/BANDAGES/DRESSINGS) ×2 IMPLANT
BLADE SURG ROTATE 9660 (MISCELLANEOUS) IMPLANT
BRUSH SCRUB EZ PLAIN DRY (MISCELLANEOUS) ×2 IMPLANT
BUR MATCHSTICK NEURO 3.0 LAGG (BURR) ×2 IMPLANT
CAGE 10X22 (Cage) ×2 IMPLANT
CANISTER SUCTION 2500CC (MISCELLANEOUS) ×2 IMPLANT
CAP LCK SPNE (Orthopedic Implant) ×6 IMPLANT
CAP LOCK SPINE RADIUS (Orthopedic Implant) IMPLANT
CAP LOCKING (Orthopedic Implant) ×12 IMPLANT
CLOTH BEACON ORANGE TIMEOUT ST (SAFETY) ×2 IMPLANT
CONT SPEC 4OZ CLIKSEAL STRL BL (MISCELLANEOUS) ×4 IMPLANT
COVER BACK TABLE 24X17X13 BIG (DRAPES) IMPLANT
COVER TABLE BACK 60X90 (DRAPES) ×2 IMPLANT
CROSSLINK VARIABLE M-A (Orthopedic Implant) ×1 IMPLANT
DECANTER SPIKE VIAL GLASS SM (MISCELLANEOUS) ×2 IMPLANT
DERMABOND ADVANCED (GAUZE/BANDAGES/DRESSINGS) ×1
DERMABOND ADVANCED .7 DNX12 (GAUZE/BANDAGES/DRESSINGS) ×1 IMPLANT
DRAPE C-ARM 42X72 X-RAY (DRAPES) ×4 IMPLANT
DRAPE LAPAROTOMY 100X72X124 (DRAPES) ×2 IMPLANT
DRAPE POUCH INSTRU U-SHP 10X18 (DRAPES) ×2 IMPLANT
DRAPE PROXIMA HALF (DRAPES) IMPLANT
DRAPE SURG 17X23 STRL (DRAPES) ×8 IMPLANT
DURAPREP 26ML APPLICATOR (WOUND CARE) ×2 IMPLANT
ELECT REM PT RETURN 9FT ADLT (ELECTROSURGICAL) ×2
ELECTRODE REM PT RTRN 9FT ADLT (ELECTROSURGICAL) ×1 IMPLANT
EVACUATOR 1/8 PVC DRAIN (DRAIN) ×2 IMPLANT
GAUZE SPONGE 4X4 16PLY XRAY LF (GAUZE/BANDAGES/DRESSINGS) IMPLANT
GLOVE BIOGEL M 8.0 STRL (GLOVE) ×1 IMPLANT
GLOVE BIOGEL PI IND STRL 7.5 (GLOVE) IMPLANT
GLOVE BIOGEL PI INDICATOR 7.5 (GLOVE) ×1
GLOVE ECLIPSE 8.5 STRL (GLOVE) ×4 IMPLANT
GLOVE EXAM NITRILE LRG STRL (GLOVE) IMPLANT
GLOVE EXAM NITRILE MD LF STRL (GLOVE) IMPLANT
GLOVE EXAM NITRILE XL STR (GLOVE) IMPLANT
GLOVE EXAM NITRILE XS STR PU (GLOVE) IMPLANT
GLOVE SS BIOGEL STRL SZ 6.5 (GLOVE) IMPLANT
GLOVE SUPERSENSE BIOGEL SZ 6.5 (GLOVE) ×3
GOWN BRE IMP SLV AUR LG STRL (GOWN DISPOSABLE) ×2 IMPLANT
GOWN BRE IMP SLV AUR XL STRL (GOWN DISPOSABLE) ×4 IMPLANT
GOWN STRL REIN 2XL LVL4 (GOWN DISPOSABLE) IMPLANT
KIT BASIN OR (CUSTOM PROCEDURE TRAY) ×2 IMPLANT
KIT ROOM TURNOVER OR (KITS) ×2 IMPLANT
MILL MEDIUM DISP (BLADE) ×1 IMPLANT
NEEDLE HYPO 22GX1.5 SAFETY (NEEDLE) ×2 IMPLANT
NS IRRIG 1000ML POUR BTL (IV SOLUTION) ×2 IMPLANT
PACK LAMINECTOMY NEURO (CUSTOM PROCEDURE TRAY) ×2 IMPLANT
ROD 5.5X60MM GREEN (Rod) ×1 IMPLANT
ROD 70MM (Rod) ×2 IMPLANT
ROD SPNL 70X5.5XNS TI RDS (Rod) IMPLANT
SCREW 6.75X40MM (Screw) ×2 IMPLANT
SCREW 6.75X45MM (Screw) ×2 IMPLANT
SCREW 6.75X50MM (Screw) ×2 IMPLANT
SPONGE GAUZE 4X4 12PLY (GAUZE/BANDAGES/DRESSINGS) ×2 IMPLANT
SPONGE SURGIFOAM ABS GEL 100 (HEMOSTASIS) ×2 IMPLANT
STRIP CLOSURE SKIN 1/2X4 (GAUZE/BANDAGES/DRESSINGS) ×3 IMPLANT
SUT VIC AB 0 CT1 18XCR BRD8 (SUTURE) ×2 IMPLANT
SUT VIC AB 0 CT1 8-18 (SUTURE) ×6
SUT VIC AB 2-0 CT1 18 (SUTURE) ×2 IMPLANT
SUT VIC AB 3-0 SH 8-18 (SUTURE) ×5 IMPLANT
SYR 20ML ECCENTRIC (SYRINGE) ×2 IMPLANT
TAPE CLOTH SURG 4X10 WHT LF (GAUZE/BANDAGES/DRESSINGS) ×1 IMPLANT
TOWEL OR 17X24 6PK STRL BLUE (TOWEL DISPOSABLE) ×2 IMPLANT
TOWEL OR 17X26 10 PK STRL BLUE (TOWEL DISPOSABLE) ×2 IMPLANT
TRAY FOLEY CATH 14FRSI W/METER (CATHETERS) ×2 IMPLANT
WATER STERILE IRR 1000ML POUR (IV SOLUTION) ×2 IMPLANT
WEDGE TANGENT 10X26MM ×2 IMPLANT

## 2012-09-10 NOTE — Brief Op Note (Signed)
09/10/2012  3:51 PM  PATIENT:  Riley Clarke  57 y.o. male  PRE-OPERATIVE DIAGNOSIS:  stenosis  POST-OPERATIVE DIAGNOSIS:  stenosis  PROCEDURE:  Procedure(s) with comments: POSTERIOR LUMBAR FUSION 2 LEVEL (N/A) - Lumbar four-five, lumbar five-sacral one decompression lumbar laminectomy, posterior lumbar interbody fusion, tangent/telamon cage, posterior lateral arthrodesis with pedicle screws  SURGEON:  Surgeon(s) and Role:    * Temple Pacini, MD - Primary    * Karn Cassis, MD - Assisting  PHYSICIAN ASSISTANT:   ASSISTANTS:    ANESTHESIA:   general  EBL:  Total I/O In: 2230 [I.V.:2000; Blood:230] Out: 825 [Urine:225; Blood:600]  BLOOD ADMINISTERED:none  DRAINS: (Medium) Hemovact drain(s) in the Epidural space with  Suction Open   LOCAL MEDICATIONS USED:  MARCAINE     SPECIMEN:  No Specimen  DISPOSITION OF SPECIMEN:  N/A  COUNTS:  YES  TOURNIQUET:  * No tourniquets in log *  DICTATION: .Dragon Dictation  PLAN OF CARE: Admit to inpatient   PATIENT DISPOSITION:  PACU - hemodynamically stable.   Delay start of Pharmacological VTE agent (>24hrs) due to surgical blood loss or risk of bleeding: yes

## 2012-09-10 NOTE — H&P (Signed)
Riley Clarke is an 57 y.o. male.   Chief Complaint: Back and bilateral leg pain HPI: 57 year old male with severe back and bilateral leg pain left greater than right. Symptoms have failed conservative management. 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 Patient symptoms are worsened by standing or ambulation are and are quite consistent with neurogenic claudication. Patient has been counseled as to his options. He is failed conservative management. He presents now for decompression and fusion surgery in hopes of improving his symptoms.  Past Medical History  Diagnosis Date  . Hypertension   . Diabetes mellitus without complication   . Headache   . Arthritis   . GERD (gastroesophageal reflux disease)     Past Surgical History  Procedure Laterality Date  . Cervical disc surgery Left 99    No family history on file. Social History:  reports that he has been smoking Cigarettes.  He has a 6.25 pack-year smoking history. He does not have any smokeless tobacco history on file. He reports that he drinks about 2.4 ounces of alcohol per week. He reports that he does not use illicit drugs.  Allergies: No Known Allergies  Medications Prior to Admission  Medication Sig Dispense Refill  . esomeprazole (NEXIUM) 40 MG capsule Take 40 mg by mouth daily before breakfast.      . linagliptin (TRADJENTA) 5 MG TABS tablet Take 5 mg by mouth daily.      Marland Kitchen losartan-hydrochlorothiazide (HYZAAR) 100-25 MG per tablet Take 1 tablet by mouth daily.      . sulindac (CLINORIL) 200 MG tablet Take 200 mg by mouth 2 (two) times daily.      . traMADol (ULTRAM) 50 MG tablet Take 50 mg by mouth every 6 (six) hours as needed for pain.        Results for orders placed during the hospital encounter of 09/10/12 (from the past 48 hour(s))   GLUCOSE, CAPILLARY     Status: Abnormal   Collection Time    09/10/12  9:04 AM      Result Value Range   Glucose-Capillary 281 (*) 70 - 99 mg/dL   No results found.  Review of Systems  Constitutional: Negative.   HENT: Negative.   Eyes: Negative.   Respiratory: Negative.   Cardiovascular: Negative.   Gastrointestinal: Negative.   Genitourinary: Negative.   Musculoskeletal: Negative.   Skin: Negative.   Neurological: Negative.   Endo/Heme/Allergies: Negative.   Psychiatric/Behavioral: Negative.     Blood pressure 157/95, pulse 81, temperature 98.3 F (36.8 C), temperature source Oral, resp. rate 20, SpO2 97.00%. Physical Exam  Constitutional: He is oriented to person, place, and time. He appears well-developed and well-nourished. No distress.  HENT:  Head: Normocephalic and atraumatic.  Right Ear: External ear normal.  Left Ear: External ear normal.  Mouth/Throat: Oropharynx is clear and moist. No oropharyngeal exudate.  Eyes: Conjunctivae and EOM are normal. Pupils are equal, round, and reactive to light. Right eye exhibits no discharge. Left eye exhibits no discharge.  Neck: Normal range of motion. Neck supple. No tracheal deviation present. No thyromegaly present.  Cardiovascular: Normal rate, regular rhythm, normal heart sounds and intact distal pulses.  Exam reveals no friction rub.   No murmur heard. Respiratory: Effort normal and breath sounds normal. No respiratory distress. He has no wheezes.  GI: Soft. Bowel sounds are  normal. He exhibits no distension. There is no tenderness.  Musculoskeletal: Normal range of motion. He exhibits no edema and no tenderness.  Neurological: He is alert and oriented to person, place, and time. He has normal reflexes. No cranial nerve deficit. Coordination normal.  Skin: Skin is warm and dry. No rash noted. He is not diaphoretic. No erythema. No pallor.  Psychiatric: He has a normal mood and affect. His behavior is normal. Judgment and  thought content normal.     Assessment/Plan L4-5 and L5-S1 stenosis with neurogenic claudication affecting the L4-L5 and S1 nerve roots bilaterally. Plan L4-5 and L5-S1 decompressive laminectomy with foraminotomies followed by posterior lumbar interbody fusion utilizing tangent interbody allograft wedge Telamon interbody peek cage and local autograft. This will be coupled with posterior lateral arthrodesis utilizing nonsegmental pedicle screw instrumentation and local autograft. Risks and benefits have been explained. Patient wishes to proceed.  Riley Clarke A 09/10/2012, 11:29 AM

## 2012-09-10 NOTE — Anesthesia Preprocedure Evaluation (Addendum)
Anesthesia Evaluation  Patient identified by MRN, date of birth, ID band Patient awake    Reviewed: Allergy & Precautions, H&P , NPO status , Patient's Chart, lab work & pertinent test results  Airway Mallampati: I TM Distance: >3 FB Neck ROM: Full    Dental  (+) Edentulous Upper, Dental Advisory Given and Missing   Pulmonary          Cardiovascular hypertension, Pt. on medications     Neuro/Psych    GI/Hepatic GERD-  Medicated and Controlled,  Endo/Other  diabetes, Type 2, Oral Hypoglycemic Agents  Renal/GU      Musculoskeletal   Abdominal   Peds  Hematology   Anesthesia Other Findings   Reproductive/Obstetrics                          Anesthesia Physical Anesthesia Plan  ASA: II  Anesthesia Plan: General   Post-op Pain Management:    Induction: Intravenous  Airway Management Planned: Oral ETT  Additional Equipment:   Intra-op Plan:   Post-operative Plan: Extubation in OR  Informed Consent: I have reviewed the patients History and Physical, chart, labs and discussed the procedure including the risks, benefits and alternatives for the proposed anesthesia with the patient or authorized representative who has indicated his/her understanding and acceptance.     Plan Discussed with: CRNA and Surgeon  Anesthesia Plan Comments:         Anesthesia Quick Evaluation

## 2012-09-10 NOTE — Preoperative (Signed)
Beta Blockers   Reason not to administer Beta Blockers:Not Applicable 

## 2012-09-10 NOTE — Op Note (Signed)
Date of procedure: 09/10/2012  Date of dictation: Same  Service: Neurosurgery  Preoperative diagnosis: L4-5 and L5-S1 stenosis with neurogenic claudication affecting bilateral L4-L5 and S1 nerve roots.  Postoperative diagnosis: Same  Procedure Name: L4-5 and L5-S1 decompressive laminectomy with bilateral L4, L5, S1 decompressive foraminotomies, more than would be required for simple interbody fusion alone  L4-5, L5-S1 posterior lumbar interbody fusion utilizing tangent interbody allograft wedge Telamon interbody peek cage and local autograft  L4-5 S1 posterior lateral arthrodesis utilizing segmental pedicle screw station and local autograft.   Surgeon:Sabrinna Yearwood A.Crispin Vogel, M.D.  Asst. Surgeon:Botero   Anesthesia: General  Indication: 57 year old male with severe back and lower chamois pain consistent with neurogenic claudication and radiculopathy. Workup demonstrates evidence of severe stenosis at the L4-5 and L5-S1 levels. Patient presents now for decompression and fusion surgery in hopes of improving his symptoms.  Operative note: After induction of anesthesia, patient positioned prone on the Wilson frame and appropriately padded. Incision made overlying L4-5 and S1 and dissection proceeded in a subperiosteal fashion bilaterally exposing lamina and facet joints L4-5 and S1. Self-retaining traction placed intraoperative fluoroscopy used levels were confirmed. Decompressive laminectomies then performed using Leksell rongeurs Kerrison years high-speed drill to remove the entire lamina of L4 interlaminar of L5 superior aspect of lamina of S1. Inferior facetectomies of L3 and L4 performed bilaterally. Superior facetectomies of L4 and L5-4 bilateral. Ligament flavum was elevated resected piecemeal fashion. Wide decompressive foraminotomies were then performed on course exiting L4-L5 and S1 nerve roots. Bilateral discectomies and performed at L4-5 and L5-S1. Disc space and distracted starting first at  L4-5. A 10 mm distractor less than patient's left side thecal sac or respect on the right side. The spaces and reamed and cut with 10 mm tangent instruments on the right side. Soft tissues removed and interspace. A 10 x 20 6 mm tangent wedges and packed into place recessed roughly 1 mm from the posterior cortical margin of L4. Distractor was removed and patient's left side. Thecal sac or respect to the side. The space was again reamed and cut with 10 mm tangent instruments. Soft tissue interspace. The spaces further curettage using curettes. Morselized autograft was packed in the interspace for later fusion. A 10 x 22 mm Telamon cage was then impacted in place and recessed roughly 1-2 mm and posterior cortical margin of L4. Procedure then repeated at L5-S1 again without complication. Pedicles of L4-5 and S1 were then identified using surface landmarks and intraoperative fluoroscopy or temperature bone overlying the pedicles and removed using high-speed drill pedicles and probed using pedicle awl each pedicle awl track was then tapped with a 5.25 m screw tap. Each screw tap hole was probed and found to be solidly within bone. 6.75 mm radius screws were placed bilaterally at L4-L5 and S1. Transverse processes of L4-5 and S1 were then decorticated using high-speed drill. Morselized autograft packed posterior laterally for later fusion. Short segment of titanium rods and posterior the screw is L4-5 and S1. Locking caps and posterior screws were locking caps and engaged with the construct under compression. Transverse connector was placed. Final images real good position bone graft and hardware proper upper level with normal alignment of the spine. Gelfoam was placed off for hemostasis. Medium Hemovac drain was left at per space. Wounds and close in layers with Vicryl sutures. Steri-Strips and sterile dressing were applied. There were no apparent complications. Patient tolerated the procedure well and he returns they're  covering postop.

## 2012-09-10 NOTE — Anesthesia Postprocedure Evaluation (Signed)
Anesthesia Post Note  Patient: Riley Clarke  Procedure(s) Performed: Procedure(s) (LRB): POSTERIOR LUMBAR FUSION 2 LEVEL (N/A)  Anesthesia type: general  Patient location: PACU  Post pain: Pain level controlled  Post assessment: Patient's Cardiovascular Status Stable  Last Vitals:  Filed Vitals:   09/10/12 1655  BP:   Pulse: 80  Temp:   Resp: 20    Post vital signs: Reviewed and stable  Level of consciousness: sedated  Complications: No apparent anesthesia complications

## 2012-09-10 NOTE — Transfer of Care (Signed)
Immediate Anesthesia Transfer of Care Note  Patient: Riley Clarke  Procedure(s) Performed: Procedure(s) with comments: POSTERIOR LUMBAR FUSION 2 LEVEL (N/A) - Lumbar four-five, lumbar five-sacral one decompression lumbar laminectomy, posterior lumbar interbody fusion, tangent/telamon cage, posterior lateral arthrodesis with pedicle screws  Patient Location: PACU  Anesthesia Type:General  Level of Consciousness: awake, alert  and oriented  Airway & Oxygen Therapy: Patient Spontanous Breathing and Patient connected to nasal cannula oxygen  Post-op Assessment: Report given to PACU RN, Post -op Vital signs reviewed and stable and Patient moving all extremities  Post vital signs: Reviewed and stable  Complications: No apparent anesthesia complications

## 2012-09-10 NOTE — Anesthesia Procedure Notes (Signed)
Procedure Name: Intubation Date/Time: 09/10/2012 12:03 PM Performed by: Lovie Chol Pre-anesthesia Checklist: Patient identified, Emergency Drugs available, Suction available, Patient being monitored and Timeout performed Patient Re-evaluated:Patient Re-evaluated prior to inductionOxygen Delivery Method: Circle system utilized Preoxygenation: Pre-oxygenation with 100% oxygen Intubation Type: IV induction Ventilation: Mask ventilation without difficulty Laryngoscope Size: Miller and 2 Grade View: Grade I Tube type: Oral Tube size: 7.5 mm Number of attempts: 1 Airway Equipment and Method: Stylet Placement Confirmation: ETT inserted through vocal cords under direct vision,  positive ETCO2,  CO2 detector and breath sounds checked- equal and bilateral Secured at: 22 cm Tube secured with: Tape Dental Injury: Teeth and Oropharynx as per pre-operative assessment

## 2012-09-10 NOTE — Plan of Care (Signed)
Problem: Consults Goal: Diagnosis - Spinal Surgery Outcome: Completed/Met Date Met:  09/10/12 Thoraco/Lumbar Spine Fusion

## 2012-09-11 LAB — GLUCOSE, CAPILLARY
Glucose-Capillary: 259 mg/dL — ABNORMAL HIGH (ref 70–99)
Glucose-Capillary: 317 mg/dL — ABNORMAL HIGH (ref 70–99)
Glucose-Capillary: 246 mg/dL — ABNORMAL HIGH (ref 70–99)
Glucose-Capillary: 239 mg/dL — ABNORMAL HIGH (ref 70–99)

## 2012-09-11 NOTE — Progress Notes (Signed)
Inpatient Diabetes Program Recommendations  AACE/ADA: New Consensus Statement on Inpatient Glycemic Control (2013)  Target Ranges:  Prepandial:   less than 140 mg/dL      Peak postprandial:   less than 180 mg/dL (1-2 hours)      Critically ill patients:  140 - 180 mg/dL  Results for JERRARD, BRADBURN (MRN [id number]) as of 09/11/2012 15:22  Ref. Range 09/10/2012 16:16 09/10/2012 19:25 09/10/2012 21:46 09/11/2012 07:58 09/11/2012 11:51  Glucose-Capillary Latest Range: 70-99 mg/dL 409 (H) 811 (H) 914 (H) 259 (H) 317 (H)   Inpatient Diabetes Program Recommendations Insulin - Basal: start basal insulin Levemir 10 units while in the hospital  HgbA1C: order to assess prehospital glucose control Thank you  Piedad Climes BSN, RN,CDE Inpatient Diabetes Coordinator [phone] (team pager)

## 2012-09-11 NOTE — Care Management Note (Signed)
CARE MANAGEMENT NOTE 09/11/2012  Patient:  Riley Clarke, Riley Clarke   Account Number:  [id number]  Date Initiated:  09/11/2012  Documentation initiated by:  Vance Peper  Subjective/Objective Assessment:   57 yr old male s/p L4-5, L5-S1 decompression laminectomy with foraminotomies.     Action/Plan:   patient will need RW. No HH needs identified.   Anticipated DC Date:  09/11/2012   Anticipated DC Plan:  HOME/SELF CARE      DC Planning Services  CM consult      PAC Choice  DURABLE MEDICAL EQUIPMENT   Choice offered to / List presented to:     DME arranged  [facility] Lovina Reach      DME agency  [facility]     [facility] arranged  NA      Status of service:  Completed, signed off Medicare Important Message given?   (If response is "NO", the following Medicare IM given date fields will be blank) Date Medicare IM given:   Date Additional Medicare IM given:    Discharge Disposition:  HOME/SELF CARE  Per UR Regulation:    If discussed at Long Length of Stay Meetings, dates discussed:    Comments:

## 2012-09-11 NOTE — Evaluation (Signed)
Physical Therapy Evaluation Patient Details Name: Riley Clarke MRN: [id number] DOB: 11-03-1955 Today's Date: 09/11/2012 Time: 7829-5621 PT Time Calculation (min): 27 min  PT Assessment / Plan / Recommendation Clinical Impression  Pt s/p lumbar fusion.  Pt moving slowly but steadily with good understanding of precautions.  Should be able to return home.    PT Assessment  Patient needs continued PT services    Follow Up Recommendations  No PT follow up    Does the patient have the potential to tolerate intense rehabilitation      Barriers to Discharge        Equipment Recommendations  Rolling walker with 5" wheels    Recommendations for Other Services     Frequency Min 5X/week    Precautions / Restrictions Precautions Precautions: Back Required Braces or Orthoses: Spinal Brace Spinal Brace: Lumbar corset Restrictions Weight Bearing Restrictions: No   Pertinent Vitals/Pain 8/10 back pain      Mobility  Transfers Transfers: Sit to Stand;Stand to Sit Sit to Stand: 4: Min guard;With upper extremity assist;With armrests;From chair/3-in-1 Stand to Sit: 4: Min guard;With upper extremity assist;With armrests;To chair/3-in-1 Ambulation/Gait Ambulation/Gait Assistance: 5: Supervision Ambulation Distance (Feet): 175 Feet Assistive device: Rolling walker Ambulation/Gait Assistance Details: Slow but steady Gait Pattern: Step-through pattern;Decreased stride length;Decreased step length - left;Decreased step length - right Gait velocity: very slow    Exercises     PT Diagnosis: Difficulty walking;Acute pain  PT Problem List: Decreased mobility;Decreased knowledge of use of DME;Pain PT Treatment Interventions: DME instruction;Gait training;Patient/family education;Functional mobility training;Therapeutic activities   PT Goals Acute Rehab PT Goals PT Goal Formulation: With patient Time For Goal Achievement: 09/18/12 Potential to Achieve Goals: Good Pt will go  Supine/Side to Sit: with modified independence PT Goal: Supine/Side to Sit - Progress: Goal set today Pt will go Sit to Supine/Side: with modified independence PT Goal: Sit to Supine/Side - Progress: Goal set today Pt will go Sit to Stand: with modified independence PT Goal: Sit to Stand - Progress: Goal set today Pt will go Stand to Sit: with modified independence PT Goal: Stand to Sit - Progress: Goal set today Pt will Ambulate: 51 - 150 feet;with modified independence;with least restrictive assistive device PT Goal: Ambulate - Progress: Goal set today Pt will Go Up / Down Stairs: 1-2 stairs;with min assist PT Goal: Up/Down Stairs - Progress: Goal set today  Visit Information  Last PT Received On: 09/11/12 Assistance Needed: +1    Subjective Data  Subjective: Pt states he walked twice last night. Patient Stated Goal: Return home   Prior Functioning  Home Living Lives With: Alone Available Help at Discharge: Family;Personal care attendant;Available PRN/intermittently Home Access: Stairs to enter Entrance Stairs-Number of Steps: 2 Entrance Stairs-Rails: None Home Layout: One level Bathroom Shower/Tub: Engineer, manufacturing systems: Standard Home Adaptive Equipment: Grab bars in shower;Raised toilet seat with rails;Shower chair with back;Walker - standard Prior Function Able to Take Stairs?: Yes Vocation: On disability Communication Communication: No difficulties    Cognition  Cognition Overall Cognitive Status: Appears within functional limits for tasks assessed/performed Arousal/Alertness: Awake/alert Orientation Level: Appears intact for tasks assessed Behavior During Session: [facility] for tasks performed    Extremity/Trunk Assessment Right Lower Extremity Assessment RLE ROM/Strength/Tone: [facility] for tasks assessed Left Lower Extremity Assessment LLE ROM/Strength/Tone: [facility] for tasks assessed   Balance Balance Balance Assessed: Yes Static Standing Balance Static  Standing - Balance Support: Bilateral upper extremity supported Static Standing - Level of Assistance: 5: Stand by assistance  End  of Session PT - End of Session Equipment Utilized During Treatment: Gait belt;Back brace Activity Tolerance: Patient tolerated treatment well Patient left: in chair Nurse Communication: Mobility status  GP     [facility] 09/11/2012, 9:58 AM  [facility] PT [phone]

## 2012-09-11 NOTE — Progress Notes (Signed)
Occupational Therapy Discharge Patient Details Name: IGNACE MANDIGO MRN: [id number] DOB: 15-Mar-1956 Today's Date: 09/11/2012 Time: 4098-1191 OT Time Calculation (min): 12 min  Patient discharged from OT services secondary to goals met and no further OT needs identified.  Please see latest therapy progress note for current level of functioning and progress toward goals.    Progress and discharge plan discussed with patient and/or caregiver: Patient/Caregiver agrees with plan  GO     Harrel Carina [facility] 09/11/2012, 10:12 AM

## 2012-09-11 NOTE — Progress Notes (Signed)
UR COMPLETED  

## 2012-09-11 NOTE — Progress Notes (Signed)
Postop day 1.  No issues overnight. Back pain well controlled. Legs feel much better. No other complaints.  Afebrile. Vital stable. Urine Good. Drain output moderately high. Awake and alert. Oriented and appropriate. Motor and sensory function of the extremities normal. Dressing clean and dry. Chest and abdomen benign.  Overall doing well. Mobilize with therapy today. Possibly consider discharge as early as tomorrow.

## 2012-09-11 NOTE — Evaluation (Addendum)
Occupational Therapy Evaluation Patient Details Name: Riley Clarke MRN: [id number] DOB: Dec 12, 1955 Today's Date: 09/11/2012 Time: 6387-5643 OT Time Calculation (min): 12 min  OT Assessment / Plan / Recommendation Clinical Impression  57 yo male s/p L4-S1 PLIF that does not require skilled Ot acutely. Recommend HHOT for follow up. Ot to sign off    OT Assessment  All further OT needs can be met in the next venue of care    Follow Up Recommendations   NO Home health OT    Barriers to Discharge      Equipment Recommendations  None recommended by OT    Recommendations for Other Services    Frequency       Precautions / Restrictions Precautions Precautions: Back Required Braces or Orthoses: Spinal Brace Spinal Brace: Lumbar corset;Applied in sitting position Restrictions Weight Bearing Restrictions: No   Pertinent Vitals/Pain No pain reported during session    ADL  Eating/Feeding: Independent Where Assessed - Eating/Feeding: Chair Lower Body Dressing: Modified independent (with AE ; without AE will be total (A)) Where Assessed - Lower Body Dressing: Unsupported sitting (pt is unable to cross bil LE) Toilet Transfer: Min Pension scheme manager Method: Sit to Barista: Raised toilet seat with arms (or 3-in-1 over toilet) Transfers/Ambulation Related to ADLs: observed with PT session  ADL Comments: Pt educated on the use of Reacher for LB dressing and to pick up objects dropped on the floor when home alone. pt has aid to help with Adls. Pt concerned about exiting bed on the left side so bed mobility practiced. Pt has long handle sponge for LB bathing in the shower on a seat    OT Diagnosis:    OT Problem List:   OT Treatment Interventions:     OT Goals    Visit Information  Last OT Received On: 09/11/12 Assistance Needed: +1    Subjective Data  Subjective: "Oh can I get that where I got my shower seat?" - pt requesting where to purchase a  reacher Patient Stated Goal: to return home with aide assistance   Prior Functioning     Home Living Lives With: Alone Available Help at Discharge: Family;Personal care attendant;Available PRN/intermittently Home Access: Stairs to enter Entrance Stairs-Number of Steps: 2 Entrance Stairs-Rails: None Home Layout: One level Bathroom Shower/Tub: Engineer, manufacturing systems: Standard Home Adaptive Equipment: Grab bars in shower;Raised toilet seat with rails;Shower chair with back;Walker - standard Additional Comments: Aid (A) with tub transfers, shaving and meals if needed Prior Function Able to Take Stairs?: Yes Vocation: On disability Communication Communication: No difficulties Dominant Hand: Right         Vision/Perception Vision - History Baseline Vision: No visual deficits Patient Visual Report: No change from baseline   Cognition  Cognition Overall Cognitive Status: Appears within functional limits for tasks assessed/performed Arousal/Alertness: Awake/alert Orientation Level: Appears intact for tasks assessed Behavior During Session: [facility] for tasks performed    Extremity/Trunk Assessment Right Upper Extremity Assessment RUE ROM/Strength/Tone: WFL for tasks assessed;Due to precautions RUE Coordination: WFL - gross motor Left Upper Extremity Assessment LUE ROM/Strength/Tone: WFL for tasks assessed;Due to precautions LUE Coordination: WFL - gross motor Right Lower Extremity Assessment RLE ROM/Strength/Tone: WFL for tasks assessed Left Lower Extremity Assessment LLE ROM/Strength/Tone: [facility] for tasks assessed Trunk Assessment Trunk Assessment: Normal     Mobility Bed Mobility Bed Mobility: Supine to Sit;Sitting - Scoot to Edge of Bed;Sit to Supine Supine to Sit: 6: Modified independent (Device/Increase time);HOB flat Sitting - Scoot  to Edge of Bed: 6: Modified independent (Device/Increase time);With rail Sit to Supine: 6: Modified independent (Device/Increase  time);HOB flat Transfers Sit to Stand: 4: Min guard;With upper extremity assist;With armrests;From chair/3-in-1 Stand to Sit: 4: Min guard;With upper extremity assist;With armrests;To chair/3-in-1     Exercise     Balance Balance Balance Assessed: Yes Static Standing Balance Static Standing - Balance Support: Bilateral upper extremity supported Static Standing - Level of Assistance: 5: Stand by assistance   End of Session OT - End of Session Activity Tolerance: Patient tolerated treatment well Patient left: in chair;with call bell/phone within reach Nurse Communication: Mobility status;Precautions  GO  Pt demonstrated MOD I don / doff brace sitting unsupported. Pt educated on never on bare skin and never sleep in aspen brace.   Harrel Carina [facility] 09/11/2012, 10:11 AM Pager: [phone]

## 2012-09-12 DIAGNOSIS — E119 Type 2 diabetes mellitus without complications: Secondary | ICD-10-CM | POA: Diagnosis present

## 2012-09-12 LAB — GLUCOSE, CAPILLARY
Glucose-Capillary: 287 mg/dL — ABNORMAL HIGH (ref 70–99)
Glucose-Capillary: 271 mg/dL — ABNORMAL HIGH (ref 70–99)

## 2012-09-12 MED ORDER — OXYCODONE-ACETAMINOPHEN 5-325 MG PO TABS: 1.0000 | ORAL_TABLET | ORAL | Status: DC | PRN

## 2012-09-12 MED ORDER — DIAZEPAM 5 MG PO TABS: 5.0000 mg | ORAL_TABLET | Freq: Four times a day (QID) | ORAL | Status: DC | PRN

## 2012-09-12 MED FILL — Heparin Sodium (Porcine) Inj 1000 Unit/ML: INTRAMUSCULAR | Qty: 30 | Status: AC

## 2012-09-12 MED FILL — Sodium Chloride IV Soln 0.9%: INTRAVENOUS | Qty: 1000 | Status: AC

## 2012-09-12 NOTE — Discharge Summary (Signed)
Physician Discharge Summary  Patient ID: Riley Clarke MRN: [id number] DOB/AGE: 55/27/1957 55 y.o.  Admit date: 09/10/2012 Discharge date: 09/12/2012  Admission Diagnoses:  Discharge Diagnoses:  Principal Problem:   Spinal stenosis, lumbar region, with neurogenic claudication Active Problems:   Diabetes   Discharged Condition: good  Hospital Course: Patient admitted to the hospital where he underwent uncomplicated L4-5 and L5-S1 decompression and fusion with instrumentation. Postoperatively is done very well. Preoperative back and leg pain much improved. Mobilizing without difficulty ready for discharge home.  Consults:   Significant Diagnostic Studies:   Treatments:   Discharge Exam: Blood pressure 133/84, pulse 92, temperature 98.6 F (37 C), temperature source Oral, resp. rate 16, SpO2 96.00%. Awake and alert. Oriented and appropriate. Cranial nerve function intact. Motor and sensory function of the extremities normal. Wound clean and dry. Chest and abdomen benign.  Disposition:      Medication List    TAKE these medications       diazepam 5 MG tablet  Commonly known as:  VALIUM  Take 1-2 tablets (5-10 mg total) by mouth every 6 (six) hours as needed.     esomeprazole 40 MG capsule  Commonly known as:  NEXIUM  Take 40 mg by mouth daily before breakfast.     linagliptin 5 MG Tabs tablet  Commonly known as:  TRADJENTA  Take 5 mg by mouth daily.     losartan-hydrochlorothiazide 100-25 MG per tablet  Commonly known as:  HYZAAR  Take 1 tablet by mouth daily.     oxyCODONE-acetaminophen 5-325 MG per tablet  Commonly known as:  PERCOCET/ROXICET  Take 1-2 tablets by mouth every 4 (four) hours as needed.     sulindac 200 MG tablet  Commonly known as:  CLINORIL  Take 200 mg by mouth 2 (two) times daily.     traMADol 50 MG tablet  Commonly known as:  ULTRAM  Take 50 mg by mouth every 6 (six) hours as needed for pain.           Follow-up Information   Follow up with Mianna Iezzi A, MD. Call in 1 week. (Ask for Lurena Joiner)    Contact information:   [address] [place] [address] [phone]       Signed: Arihana Ambrocio A 09/12/2012, 1:10 PM

## 2012-09-12 NOTE — Progress Notes (Signed)
Pt doing very well. Pt given D/C instructions & Rx's with verbal feedback. Pt D/C'd home via wheelchair @ 1430 per MD order. Pt received RW prior to D/C. Rema Fendt, RN

## 2012-09-12 NOTE — Progress Notes (Signed)
Physical Therapy Discharge Patient Details Name: DARAN FAVARO MRN: [id number] DOB: 1955-08-01 Today's Date: 09/12/2012 Time: 4098-1191 PT Time Calculation (min): 17 min  Patient discharged from PT services secondary to goals met and no further PT needs identified.  Please see latest therapy progress note for current level of functioning and progress toward goals.    Progress and discharge plan discussed with patient and/or caregiver: Patient/Caregiver agrees with plan  GP     [facility] 09/12/2012, 9:38 AM

## 2012-09-12 NOTE — Progress Notes (Signed)
Physical Therapy Treatment Patient Details Name: Riley Clarke MRN: [id number] DOB: Jul 25, 1955 Today's Date: 09/12/2012 Time: 0865-7846 PT Time Calculation (min): 17 min  PT Assessment / Plan / Recommendation Comments on Treatment Session  Pt s/p lumbar fusion.  Pt doing well with mobility and ready for dc home from PT standpoint.  Discussed with pt to gradually decr use of walker as post-op pain and soreness decr.  Pt demonstrates/verbalizes good understanding of back precautions.    Follow Up Recommendations  No PT follow up     Does the patient have the potential to tolerate intense rehabilitation     Barriers to Discharge        Equipment Recommendations  Rolling walker with 5" wheels    Recommendations for Other Services    Frequency     Plan All goals met and education completed, patient dischaged from PT services    Precautions / Restrictions Precautions Precautions: Back Required Braces or Orthoses: Spinal Brace Spinal Brace: Lumbar corset;Applied in sitting position Restrictions Weight Bearing Restrictions: No   Pertinent Vitals/Pain 4/10 in back.    Mobility  Transfers Sit to Stand: 6: Modified independent (Device/Increase time);With upper extremity assist;With armrests;From chair/3-in-1 Stand to Sit: 6: Modified independent (Device/Increase time);With upper extremity assist;With armrests;To chair/3-in-1 Ambulation/Gait Ambulation/Gait Assistance: 6: Modified independent (Device/Increase time) Ambulation Distance (Feet): 800 Feet Assistive device: Rolling walker Gait Pattern: Step-through pattern;Decreased stride length Gait velocity: faster than yesterday Stairs: Yes Stairs Assistance: 4: Min assist Stairs Assistance Details (indicate cue type and reason): hand-held on rt. Stair Management Technique: Step to pattern;No rails;Forwards Number of Stairs: 2    Exercises     PT Diagnosis:    PT Problem List:   PT Treatment Interventions:     PT  Goals Acute Rehab PT Goals PT Goal: Supine/Side to Sit - Progress: Met (with OT yesterday) PT Goal: Sit to Supine/Side - Progress: Met (with OT yesterday) PT Goal: Sit to Stand - Progress: Met PT Goal: Stand to Sit - Progress: Met PT Goal: Ambulate - Progress: Met PT Goal: Up/Down Stairs - Progress: Met  Visit Information  Last PT Received On: 09/12/12 Assistance Needed: +1    Subjective Data  Subjective: Pt states he is doing okay.   Cognition  Cognition Overall Cognitive Status: Appears within functional limits for tasks assessed/performed Arousal/Alertness: Awake/alert Orientation Level: Appears intact for tasks assessed Behavior During Session: [facility] for tasks performed    Balance  Static Standing Balance Static Standing - Balance Support: No upper extremity supported Static Standing - Level of Assistance: 7: Independent  End of Session PT - End of Session Equipment Utilized During Treatment: Back brace Activity Tolerance: Patient tolerated treatment well Patient left: in chair Nurse Communication: Mobility status   GP     [facility] 09/12/2012, 9:37 AM  [facility] PT [phone]

## 2012-09-13 LAB — TYPE AND SCREEN
ABO/RH(D): AB POS
Antibody Screen: POSITIVE
DAT, IgG: NEGATIVE
Donor AG Type: NEGATIVE
Donor AG Type: NEGATIVE
Unit division: 0
Unit division: 0

## 2012-12-27 ENCOUNTER — Telehealth: Payer: Self-pay | Admitting: *Deleted

## 2012-12-27 NOTE — Telephone Encounter (Signed)
Form received asking for surgical clearance.  Dr Allyson Sabal gave cardiac clearance and form was faxed back to [facility]

## 2013-04-30 DIAGNOSIS — M5126 Other intervertebral disc displacement, lumbar region: Secondary | ICD-10-CM | POA: Insufficient documentation

## 2013-05-14 ENCOUNTER — Other Ambulatory Visit: Payer: Self-pay | Admitting: Neurosurgery

## 2013-05-14 DIAGNOSIS — M5126 Other intervertebral disc displacement, lumbar region: Secondary | ICD-10-CM

## 2013-05-23 ENCOUNTER — Ambulatory Visit
Admission: RE | Admit: 2013-05-23 | Discharge: 2013-05-23 | Disposition: A | Discharge status: Home / Self Care | Payer: Medicare HMO | Source: Ambulatory Visit | Attending: Neurosurgery | Admitting: Neurosurgery

## 2013-05-23 DIAGNOSIS — M5126 Other intervertebral disc displacement, lumbar region: Secondary | ICD-10-CM

## 2013-05-23 MED ORDER — GADOBENATE DIMEGLUMINE 529 MG/ML IV SOLN
20.0000 mL | Freq: Once | INTRAVENOUS | Status: AC | PRN
  Administered 2013-05-23: 20 mL via INTRAVENOUS

## 2014-08-04 ENCOUNTER — Encounter: Payer: Self-pay | Admitting: Dietician

## 2014-08-04 ENCOUNTER — Encounter: Payer: Medicare Other | Attending: Internal Medicine | Admitting: Dietician

## 2014-08-04 VITALS — Ht 73.0 in | Wt 240.0 lb

## 2014-08-04 DIAGNOSIS — E119 Type 2 diabetes mellitus without complications: Secondary | ICD-10-CM | POA: Diagnosis not present

## 2014-08-04 DIAGNOSIS — Z6831 Body mass index (BMI) 31.0-31.9, adult: Secondary | ICD-10-CM | POA: Insufficient documentation

## 2014-08-04 DIAGNOSIS — Z713 Dietary counseling and surveillance: Secondary | ICD-10-CM | POA: Diagnosis present

## 2014-08-04 DIAGNOSIS — I10 Essential (primary) hypertension: Secondary | ICD-10-CM | POA: Insufficient documentation

## 2014-08-04 NOTE — Patient Instructions (Signed)
Plan:  Aim for 4 Carb Choices per meal (60 grams) +/- 1 either way  Aim for 0-2 Carbs per snack if hungry  Include protein in moderation with your meals and snacks Consider reading food labels for Total Carbohydrate and portion sizes Continue to walk every day.   Consider checking BG at alternate times per day as directed by MD  Consider taking medication as directed by MD  Rethink what you drink.  Choose water, unsweetened tea, sugar sub for coffee, diet soda or avoid. Bake rather fry.  Lean meat/skinnless chicken

## 2014-08-04 NOTE — Progress Notes (Signed)
Diabetes Self-Management Education  Visit Type:  Type 2 DM with HTN Appt. Start Time: 1415 Appt. End Time: 1530  08/04/2014  Mr. Riley Clarke, identified by name and date of birth, is a 59 y.o. male with a diagnosis of Diabetes: Type 2.  Patient is here alone.  He lives alone and does his own shopping and cooking.  ASSESSMENT Height  (1.854 m), weight 240 lb (108.863 kg). Body mass index is 31.67 kg/(m^2).  Initial Visit Information: Are you currently following a meal plan?: No   Are you taking your medications as prescribed?: Yes (Meds also include Jardiance and Metformin, and Trulicity).  Patient reports that he ran out of the Trulicity and has not refilled this.  Are you checking your feet?: No   How often do you need to have someone help you when you read instructions, pamphlets, or other written materials from your doctor or pharmacy?: 1 - Never What is the last grade level you completed in school?: 11th grade of high school  Psychosocial:   Patient Belief/Attitude about Diabetes: Other (comment) (struggling with diabetes, Neuropathy in both legs are getting worse) Self-care barriers: None Self-management support: Doctor's office Other persons present: Patient Patient Concerns: Nutrition/Meal planning, Glycemic Control Special Needs: None Preferred Learning Style: Hands on Learning Readiness: Contemplating  Complications:  Last HgB A1C per patient/outside source: 11.1 mg/dL (1/61/09) How often do you check your blood sugar?: 1-2 times/day (before breakfast and after lunch) Fasting Blood glucose range (mg/dL): 604-540 Postprandial Blood glucose range (mg/dL): >981 Number of hypoglycemic episodes per month: 1 Can you tell when your blood sugar is low?: No Number of hyperglycemic episodes per week: 30 Can you tell when your blood sugar is high?: No Have you had a dilated eye exam in the past 12 months?: No Have you had a dental exam in the past 12 months?:  Yes  Diet Intake:  Breakfast: 1 cup grits, 2 eggs, 2 sausage, 2 white toast or biscuit with butter and jelly, coffee with 2 Tablespoons and whole milk OR 2 cups cornflakes with whole milk (2 pm because it takes that long to get hungry) Lunch: none Snack (afternoon): popcorn and/or fruit Dinner: baked or fried chicken, greens, brown or white rice (6 or 7) Snack (evening):  potato chips Beverage(s): Lipton peach sweet iced tea, coffee with 2 tablespoons of sugar and whole milk, water, rare regular pepsi, OJ occasionally  Exercise: Exercise: Light (walking / raking leaves) Light Exercise amount of time (min / week): 15  Individualized Plan for Diabetes Self-Management Training:   Learning Objective:  Patient will have a greater understanding of diabetes self-management.  Patient education plan per assessed needs and concerns is to attend individual sessions for     Education Topics Reviewed with Patient Today:  Definition of diabetes, type 1 and 2, and the diagnosis of diabetes Role of diet in the treatment of diabetes and the relationship between the three main macronutrients and blood glucose level, Food label reading, portion sizes and measuring food., Carbohydrate counting, Information on hints to eating out and maintain blood glucose control. Role of exercise on diabetes management, blood pressure control and cardiac health.         Role of stress on diabetes     PATIENTS GOALS/Plan (Developed by the patient):  Nutrition: Follow meal plan discussed, General guidelines for healthy choices and portions discussed Physical Activity: Exercise 3-5 times per week Medications: take my medication as prescribed  Plan:   Patient Instructions  Plan:  Aim for 4 Carb Choices per meal (60 grams) +/- 1 either way  Aim for 0-2 Carbs per snack if hungry  Include protein in moderation with your meals and snacks Consider reading food labels for Total Carbohydrate and portion  sizes Continue to walk every day.   Consider checking BG at alternate times per day as directed by MD  Consider taking medication as directed by MD  Rethink what you drink.  Choose water, unsweetened tea, sugar sub for coffee, diet soda or avoid. Bake rather fry.  Lean meat/skinnless chicken  Expected Outcomes:  Other (comment) (Interest in learning but outcome unknown at this time.)  Education material provided: Living Well with Diabetes, Food label handouts, A1C conversion sheet, Meal plan card, My Plate and Snack sheet  If problems or questions, patient to contact team via:  Phone and Email  Future DSME appointment: PRN

## 2014-09-06 ENCOUNTER — Emergency Department (HOSPITAL_COMMUNITY): Payer: Medicare Other

## 2014-09-06 ENCOUNTER — Emergency Department (HOSPITAL_COMMUNITY)
Admission: EM | Admit: 2014-09-06 | Discharge: 2014-09-06 | Disposition: A | Discharge status: Home / Self Care | Payer: Medicare Other | Attending: Emergency Medicine | Admitting: Emergency Medicine

## 2014-09-06 ENCOUNTER — Encounter (HOSPITAL_COMMUNITY): Payer: Self-pay | Admitting: Emergency Medicine

## 2014-09-06 DIAGNOSIS — Z720 Tobacco use: Secondary | ICD-10-CM | POA: Insufficient documentation

## 2014-09-06 DIAGNOSIS — S86819A Strain of other muscle(s) and tendon(s) at lower leg level, unspecified leg, initial encounter: Secondary | ICD-10-CM | POA: Diagnosis not present

## 2014-09-06 DIAGNOSIS — K219 Gastro-esophageal reflux disease without esophagitis: Secondary | ICD-10-CM | POA: Insufficient documentation

## 2014-09-06 DIAGNOSIS — Y929 Unspecified place or not applicable: Secondary | ICD-10-CM | POA: Diagnosis not present

## 2014-09-06 DIAGNOSIS — S86112A Strain of other muscle(s) and tendon(s) of posterior muscle group at lower leg level, left leg, initial encounter: Secondary | ICD-10-CM

## 2014-09-06 DIAGNOSIS — I10 Essential (primary) hypertension: Secondary | ICD-10-CM | POA: Insufficient documentation

## 2014-09-06 DIAGNOSIS — Y939 Activity, unspecified: Secondary | ICD-10-CM | POA: Insufficient documentation

## 2014-09-06 DIAGNOSIS — S81802A Unspecified open wound, left lower leg, initial encounter: Secondary | ICD-10-CM | POA: Insufficient documentation

## 2014-09-06 DIAGNOSIS — Z79899 Other long term (current) drug therapy: Secondary | ICD-10-CM | POA: Insufficient documentation

## 2014-09-06 DIAGNOSIS — W3400XA Accidental discharge from unspecified firearms or gun, initial encounter: Secondary | ICD-10-CM | POA: Diagnosis not present

## 2014-09-06 DIAGNOSIS — Z8739 Personal history of other diseases of the musculoskeletal system and connective tissue: Secondary | ICD-10-CM | POA: Diagnosis not present

## 2014-09-06 DIAGNOSIS — E119 Type 2 diabetes mellitus without complications: Secondary | ICD-10-CM | POA: Insufficient documentation

## 2014-09-06 DIAGNOSIS — Y999 Unspecified external cause status: Secondary | ICD-10-CM | POA: Diagnosis not present

## 2014-09-06 LAB — CBC WITH DIFFERENTIAL/PLATELET
Basophils Absolute: 0 10*3/uL (ref 0.0–0.1)
Basophils Relative: 0 % (ref 0–1)
Eosinophils Absolute: 0.3 10*3/uL (ref 0.0–0.7)
Eosinophils Relative: 3 % (ref 0–5)
HCT: 43 % (ref 39.0–52.0)
Hemoglobin: 14.3 g/dL (ref 13.0–17.0)
Lymphocytes Relative: 36 % (ref 12–46)
Lymphs Abs: 3.4 10*3/uL (ref 0.7–4.0)
MCH: 30.4 pg (ref 26.0–34.0)
MCHC: 33.3 g/dL (ref 30.0–36.0)
MCV: 91.3 fL (ref 78.0–100.0)
Monocytes Absolute: 0.6 10*3/uL (ref 0.1–1.0)
Monocytes Relative: 6 % (ref 3–12)
Neutro Abs: 5.2 10*3/uL (ref 1.7–7.7)
Neutrophils Relative %: 55 % (ref 43–77)
Platelets: 167 10*3/uL (ref 150–400)
RBC: 4.71 MIL/uL (ref 4.22–5.81)
RDW: 13.3 % (ref 11.5–15.5)
WBC: 9.5 10*3/uL (ref 4.0–10.5)

## 2014-09-06 LAB — COMPREHENSIVE METABOLIC PANEL
ALT: 34 U/L (ref 0–53)
AST: 32 U/L (ref 0–37)
Albumin: 4 g/dL (ref 3.5–5.2)
Alkaline Phosphatase: 94 U/L (ref 39–117)
Anion gap: 13 (ref 5–15)
BUN: 11 mg/dL (ref 6–23)
CO2: 19 mmol/L (ref 19–32)
Calcium: 9.1 mg/dL (ref 8.4–10.5)
Chloride: 104 mmol/L (ref 96–112)
Creatinine, Ser: 0.94 mg/dL (ref 0.50–1.35)
GFR calc Af Amer: >90 mL/min (ref >90)
GFR calc non Af Amer: >90 mL/min (ref >90)
Glucose, Bld: 148 mg/dL — ABNORMAL HIGH (ref 70–99)
Potassium: 3.7 mmol/L (ref 3.5–5.1)
Sodium: 136 mmol/L (ref 135–145)
Total Bilirubin: 0.6 mg/dL (ref 0.3–1.2)
Total Protein: 7 g/dL (ref 6.0–8.3)

## 2014-09-06 LAB — PROTIME-INR
INR: 1 (ref 0.00–1.49)
Prothrombin Time: 13.3 seconds (ref 11.6–15.2)

## 2014-09-06 LAB — I-STAT CG4 LACTIC ACID, ED
Lactic Acid, Venous: 3.54 mmol/L (ref 0.5–2.0)
Lactic Acid, Venous: 2.95 mmol/L (ref 0.5–2.0)

## 2014-09-06 LAB — SAMPLE TO BLOOD BANK

## 2014-09-06 LAB — ETHANOL: Alcohol, Ethyl (B): 274 mg/dL — ABNORMAL HIGH (ref 0–9)

## 2014-09-06 LAB — CDS SEROLOGY

## 2014-09-06 MED ORDER — SODIUM CHLORIDE 0.9 % IV BOLUS (SEPSIS)
1000.0000 mL | Freq: Once | INTRAVENOUS | Status: AC
  Administered 2014-09-06: 1000 mL via INTRAVENOUS

## 2014-09-06 MED ORDER — OXYCODONE-ACETAMINOPHEN 5-325 MG PO TABS: 1.0000 | ORAL_TABLET | Freq: Four times a day (QID) | ORAL | Status: DC | PRN

## 2014-09-06 MED ORDER — SODIUM CHLORIDE 0.9 % IV SOLN
INTRAVENOUS | Status: DC
  Administered 2014-09-06: 01:00:00 via INTRAVENOUS

## 2014-09-06 MED ORDER — IOHEXOL 350 MG/ML SOLN
100.0000 mL | Freq: Once | INTRAVENOUS | Status: AC | PRN
  Administered 2014-09-06: 100 mL via INTRAVENOUS

## 2014-09-06 MED ORDER — FENTANYL CITRATE 0.05 MG/ML IJ SOLN
50.0000 ug | Freq: Once | INTRAMUSCULAR | Status: AC
  Administered 2014-09-06: 50 ug via INTRAVENOUS
  Filled 2014-09-06: qty 2

## 2014-09-06 MED ORDER — TETANUS-DIPHTH-ACELL PERTUSSIS 5-2.5-18.5 LF-MCG/0.5 IM SUSP
0.5000 mL | Freq: Once | INTRAMUSCULAR | Status: AC
  Administered 2014-09-06: 0.5 mL via INTRAMUSCULAR
  Filled 2014-09-06: qty 0.5

## 2014-09-06 NOTE — ED Notes (Signed)
Dr Lynelle DoctorKnapp given a copy of lactic acid results 2.95

## 2014-09-06 NOTE — Progress Notes (Signed)
Orthopedic Tech Progress Note Patient Details:  Pam DrownBryant D Xxxbarrett 1956/04/20 [id number]  Ortho Devices Type of Ortho Device: CAM walker Ortho Device/Splint Interventions: Application   Haskell Flirtewsome, Janesa Dockery M 09/06/2014, 6:59 AM

## 2014-09-06 NOTE — ED Notes (Signed)
[employer] at bedside completing report

## 2014-09-06 NOTE — ED Notes (Signed)
Riley SearingJaclyn Smith [phone]
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
## 2014-09-06 NOTE — ED Notes (Signed)
Dr Lynelle DoctorKnapp given a copy of lactic acid 3.54
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
## 2014-09-06 NOTE — ED Notes (Signed)
Family at beside. Family given emotional support. 

## 2014-09-06 NOTE — ED Notes (Signed)
MD at bedside. 
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
## 2014-11-02 ENCOUNTER — Ambulatory Visit: Payer: Medicare HMO | Admitting: Dietician

## 2014-12-31 ENCOUNTER — Encounter: Payer: Self-pay | Admitting: *Deleted

## 2015-02-04 ENCOUNTER — Encounter: Payer: Self-pay | Admitting: Cardiovascular Disease

## 2015-02-25 DIAGNOSIS — M4806 Spinal stenosis, lumbar region: Secondary | ICD-10-CM | POA: Diagnosis not present

## 2015-02-25 DIAGNOSIS — Z6833 Body mass index (BMI) 33.0-33.9, adult: Secondary | ICD-10-CM | POA: Diagnosis not present

## 2015-04-01 ENCOUNTER — Ambulatory Visit (INDEPENDENT_AMBULATORY_CARE_PROVIDER_SITE_OTHER): Payer: Medicare Other | Admitting: Family Medicine

## 2015-04-01 ENCOUNTER — Encounter: Payer: Self-pay | Admitting: Family Medicine

## 2015-04-01 VITALS — BP 143/80 | HR 87 | Temp 98.2°F | Ht 73.0 in | Wt 248.0 lb

## 2015-04-01 DIAGNOSIS — Z1159 Encounter for screening for other viral diseases: Secondary | ICD-10-CM

## 2015-04-01 DIAGNOSIS — E138 Other specified diabetes mellitus with unspecified complications: Secondary | ICD-10-CM

## 2015-04-01 DIAGNOSIS — Z129 Encounter for screening for malignant neoplasm, site unspecified: Secondary | ICD-10-CM

## 2015-04-01 DIAGNOSIS — E559 Vitamin D deficiency, unspecified: Secondary | ICD-10-CM | POA: Diagnosis not present

## 2015-04-01 DIAGNOSIS — Z23 Encounter for immunization: Secondary | ICD-10-CM

## 2015-04-01 DIAGNOSIS — Z125 Encounter for screening for malignant neoplasm of prostate: Secondary | ICD-10-CM | POA: Diagnosis not present

## 2015-04-01 LAB — COMPLETE METABOLIC PANEL WITH GFR
ALT: 23 U/L (ref 9–46)
AST: 20 U/L (ref 10–35)
Albumin: 4.5 g/dL (ref 3.6–5.1)
Alkaline Phosphatase: 120 U/L — ABNORMAL HIGH (ref 40–115)
BUN: 13 mg/dL (ref 7–25)
CO2: 21 mmol/L (ref 20–31)
Calcium: 9.3 mg/dL (ref 8.6–10.3)
Chloride: 105 mmol/L (ref 98–110)
Creat: 0.89 mg/dL (ref 0.70–1.33)
GFR, Est African American: >89 mL/min (ref >=60)
GFR, Est Non African American: >89 mL/min (ref >=60)
Glucose, Bld: 147 mg/dL — ABNORMAL HIGH (ref 65–99)
Potassium: 4.2 mmol/L (ref 3.5–5.3)
Sodium: 138 mmol/L (ref 135–146)
Total Bilirubin: 0.3 mg/dL (ref 0.2–1.2)
Total Protein: 7.3 g/dL (ref 6.1–8.1)

## 2015-04-01 LAB — CBC WITH DIFFERENTIAL/PLATELET
Basophils Absolute: 0.1 10*3/uL (ref 0.0–0.1)
Basophils Relative: 1 % (ref 0–1)
Eosinophils Absolute: 0.4 10*3/uL (ref 0.0–0.7)
Eosinophils Relative: 5 % (ref 0–5)
HCT: 44.6 % (ref 39.0–52.0)
Hemoglobin: 15.4 g/dL (ref 13.0–17.0)
Lymphocytes Relative: 26 % (ref 12–46)
Lymphs Abs: 1.9 10*3/uL (ref 0.7–4.0)
MCH: 30.1 pg (ref 26.0–34.0)
MCHC: 34.5 g/dL (ref 30.0–36.0)
MCV: 87.1 fL (ref 78.0–100.0)
MPV: 10.7 fL (ref 8.6–12.4)
Monocytes Absolute: 0.6 10*3/uL (ref 0.1–1.0)
Monocytes Relative: 8 % (ref 3–12)
Neutro Abs: 4.4 10*3/uL (ref 1.7–7.7)
Neutrophils Relative %: 60 % (ref 43–77)
Platelets: 174 10*3/uL (ref 150–400)
RBC: 5.12 MIL/uL (ref 4.22–5.81)
RDW: 13.9 % (ref 11.5–15.5)
WBC: 7.3 10*3/uL (ref 4.0–10.5)

## 2015-04-01 LAB — LIPID PANEL
Cholesterol: 135 mg/dL (ref 125–200)
HDL: 52 mg/dL (ref >=40)
LDL Cholesterol: 51 mg/dL (ref <130)
Total CHOL/HDL Ratio: 2.6 Ratio (ref <=5.0)
Triglycerides: 160 mg/dL — ABNORMAL HIGH (ref <150)
VLDL: 32 mg/dL — ABNORMAL HIGH (ref <30)

## 2015-04-01 MED ORDER — GLUCOSE BLOOD VI STRP: ORAL_STRIP | Status: DC

## 2015-04-01 NOTE — Patient Instructions (Signed)
Continue with current medications. Let me know when you need refills Come back in 3 months for a a recheck. Be carefull of sweet foods as well as non sweet carbs like potatoes, rice, pasta in your diet. Eats lots of fruits and vegetables and lean meats that are baked, boiled for broiled. Try to walk daily.

## 2015-04-01 NOTE — Progress Notes (Signed)
Patient ID: Riley Clarke, male   DOB: April 23, 1956, 59 y.o.   MRN: [id number]   Riley Clarke, is a 59 y.o. male  [id number]:[id number]  [id number]:[id number]  DOB - Dec 13, 1955  CC:  Chief Complaint  Patient presents with  . Establish Care       HPI: Riley Clarke is a 59 y.o. male here to establish care. His primary care has been most recently managed at the [facility]  Has been mostly cared for at ED or urgent care.  He has a history of type II Diabetes and is on metformin 1000 tid and Tradjenta 5. It is unclear whether he might be on something else as well. He has hypertension and is on lisinopril 10 and losartan/hctz 100-25. He has a history of arthritis, GERD, headaches and Vitamin D deficiency. He has a number of health maintenance items that need addressing including influenza and tetanus updates, screening for prostate cancer. He reports having a colonoscopy within the last couple of years. He needs a diabetic eye exam, a diabetic foot exam, a urine micral and an A1C today.  He does not follow a diabetic diet and does not exercise regularly. He smokes 1-2 times a week and uses 40 ounces of alcohol a couple of times a week.   Allergies  Allergen Reactions  . Shrimp [Shellfish Allergy]     intolerant   Past Medical History  Diagnosis Date  . Hypertension   . Diabetes mellitus without complication (HCC)   . Headache(784.0)   . Arthritis   . GERD (gastroesophageal reflux disease)    Current Outpatient Prescriptions on File Prior to Visit  Medication Sig Dispense Refill  . esomeprazole (NEXIUM) 40 MG capsule Take 40 mg by mouth daily before breakfast.    . metFORMIN (GLUCOPHAGE) 1000 MG tablet Take 1,000 mg by mouth 2 (two) times daily with a meal.    . sulindac (CLINORIL) 200 MG tablet Take 200 mg by mouth 2 (two) times daily.    . diazepam (VALIUM) 5 MG tablet Take 1-2 tablets (5-10 mg total) by mouth every 6 (six) hours as needed. (Patient not taking: Reported on  04/01/2015) 60 tablet 1  . linagliptin (TRADJENTA) 5 MG TABS tablet Take 5 mg by mouth daily.    Marland Kitchen lisinopril (PRINIVIL,ZESTRIL) 10 MG tablet Take 10 mg by mouth daily.    Marland Kitchen losartan-hydrochlorothiazide (HYZAAR) 100-25 MG per tablet Take 1 tablet by mouth daily.    Marland Kitchen oxyCODONE-acetaminophen (PERCOCET/ROXICET) 5-325 MG per tablet Take 1 tablet by mouth every 6 (six) hours as needed for moderate pain or severe pain. (Patient not taking: Reported on 04/01/2015) 20 tablet 0  . traMADol (ULTRAM) 50 MG tablet Take 50 mg by mouth every 6 (six) hours as needed for pain.    . Vitamin D, Ergocalciferol, (DRISDOL) 50000 UNITS CAPS capsule Take 50,000 Units by mouth every 7 (seven) days.     No current facility-administered medications on file prior to visit.   Family History  Problem Relation Age of Onset  . Heart disease Mother   . Diabetes Mother    Social History   Social History  . Marital Status: Single    Spouse Name: N/A  . Number of Children: N/A  . Years of Education: N/A   Occupational History  . Not on file.   Social History Main Topics  . Smoking status: Current Every Day Smoker -- 0.25 packs/day for 25 years    Types: Cigarettes  . Smokeless tobacco: Not on  file     Comment:     . Alcohol Use: 2.4 oz/week    4 Cans of beer per week     Comment: twice a week  . Drug Use: No  . Sexual Activity: Not on file   Other Topics Concern  . Not on file   Social History Narrative    Review of Systems: Constitutional: Negative for fever, chills, appetite change, weight loss,  Fatigue. Skin: Negative for rashes or lesions of concern. HENT: Negative for ear pain, ear discharge.nose bleeds Eyes: Negative for pain, discharge, redness, itching and visual disturbance. Wears glasses Neck: Negative for pain, stiffness Respiratory: Negative for cough, shortness of breath. Has occassional swelling of feet and legs  Cardiovascular: Negative for chest pain, palpitations and leg  swelling. Gastrointestinal: Negative for abdominal pain, nausea, vomiting, diarrhea, constipations, heartburn. He sometimes feels that his food gets stuck. In the esophagtus Genitourinary: Negative for dysuria, urgency, frequency, hematuria,  Musculoskeletal: Positive for back pain, knee pain, lower leg and foot pain. Denies  joint  swelling, and gait problem.Negative for weakness. Neurological: Negative for tremors, seizures, syncope,   light-headedness, numbness. Reports occassional dizziness and headaches.  Hematological: Negative for easy bruising or bleeding Psychiatric/Behavioral: Negative for depression, anxiety, decreased concentration, confusion   Objective:   Filed Vitals:   04/01/15 1404  BP: 143/80  Pulse: 87  Temp: 98.2 F (36.8 C)    Physical Exam: Constitutional: Patient appears well-developed and well-nourished. No distress. HENT: Normocephalic, atraumatic, External right and left ear normal. Oropharynx is clear and moist.  Eyes: Conjunctivae and EOM are normal. PERRLA, no scleral icterus. Neck: Normal ROM. Neck supple. No lymphadenopathy, No thyromegaly. CVS: RRR, S1/S2 +, no murmurs, no gallops, no rubs Pulmonary: Effort and breath sounds normal, no stridor, rhonchi, wheezes, rales.  Abdominal: Soft. Normoactive BS,, no distension, tenderness, rebound or guarding.  Musculoskeletal: Normal range of motion. No edema and no tenderness.  Neuro: Alert.Normal muscle tone coordination. Non-focal Skin: Skin is warm and dry. No rash noted. Not diaphoretic. No erythema. No pallor. Psychiatric: Normal mood and affect. Behavior, judgment, thought content normal. Foot Exam: Gross normal. Monofilament within normal limits.  Lab Results  Component Value Date   WBC 7.3 04/01/2015   HGB 15.4 04/01/2015   HCT 44.6 04/01/2015   MCV 87.1 04/01/2015   PLT 174 04/01/2015   Lab Results  Component Value Date   CREATININE 0.89 04/01/2015   BUN 13 04/01/2015   NA 138 04/01/2015    K 4.2 04/01/2015   CL 105 04/01/2015   CO2 21 04/01/2015    Lab Results  Component Value Date   HGBA1C 7.3* 04/01/2015   Lipid Panel     Component Value Date/Time   CHOL 135 04/01/2015 1512   TRIG 160* 04/01/2015 1512   HDL 52 04/01/2015 1512   CHOLHDL 2.6 04/01/2015 1512   VLDL 32* 04/01/2015 1512   LDLCALC 51 04/01/2015 1512       Assessment and plan:   1. Diabetes mellitus of other type with complication (HCC) - COMPLETE METABOLIC PANEL WITH GFR - CBC with Differential - Lipid panel - Hemoglobin A1c - Microalbumin / creatinine urine ratio - TSH - Vitamin D 1,25 dihydroxy  2. Need for prophylactic vaccination with combined diphtheria-tetanus-pertussis (DTP) vaccine  - Tdap vaccine greater than or equal to 7yo IM - Pneumococcal conjugate vaccine 13-valent    3. Vitamin D deficiency  - Vitamin D 1,25 dihydroxy  4. Screening for prostate cancer  - PSA, Medicare  5. Screening for viral disease   6. Encounter for screening for other viral diseases   7. Need for immunization against influenza  - Flu Vaccine QUAD 36+ mos PF IM (Fluarix & Fluzone Quad PF)   Return in about 3 months (around 07/02/2015) for HTN, Diabetes, A1C.  The patient was given clear instructions to go to ER or return to medical center if symptoms don't improve, worsen or new problems develop. The patient verbalized understanding.    Henrietta Hoover FNP  04/05/2015, 7:34 AM

## 2015-04-02 LAB — PSA, MEDICARE: PSA: 0.64 ng/mL (ref <=4.00)

## 2015-04-02 LAB — MICROALBUMIN / CREATININE URINE RATIO
Creatinine, Urine: 93 mg/dL (ref 20–370)
Microalb Creat Ratio: 20 mcg/mg creat (ref <30)
Microalb, Ur: 1.9 mg/dL

## 2015-04-02 LAB — TSH: TSH: 1.115 u[IU]/mL (ref 0.350–4.500)

## 2015-04-02 LAB — HEMOGLOBIN A1C
Hgb A1c MFr Bld: 7.3 % — ABNORMAL HIGH (ref <5.7)
Mean Plasma Glucose: 163 mg/dL — ABNORMAL HIGH (ref <117)

## 2015-04-04 LAB — VITAMIN D 1,25 DIHYDROXY
Vitamin D 1, 25 (OH)2 Total: 67 pg/mL (ref 18–72)
Vitamin D2 1, 25 (OH)2: <8 pg/mL
Vitamin D3 1, 25 (OH)2: 67 pg/mL

## 2015-04-05 MED ORDER — GLUCOSE BLOOD VI STRP: ORAL_STRIP | Status: DC

## 2015-04-08 ENCOUNTER — Telehealth: Payer: Self-pay | Admitting: Family Medicine

## 2015-04-08 ENCOUNTER — Other Ambulatory Visit: Payer: Self-pay | Admitting: Family Medicine

## 2015-04-08 DIAGNOSIS — E138 Other specified diabetes mellitus with unspecified complications: Secondary | ICD-10-CM

## 2015-04-08 NOTE — Telephone Encounter (Signed)
Patient called requesting referral to podiatrist.

## 2015-04-08 NOTE — Telephone Encounter (Signed)
Bonita QuinLinda,  Did you need to refer patient to podiatry? Please advise. Thanks!

## 2015-04-30 ENCOUNTER — Ambulatory Visit (INDEPENDENT_AMBULATORY_CARE_PROVIDER_SITE_OTHER): Payer: Medicare Other

## 2015-04-30 ENCOUNTER — Encounter: Payer: Self-pay | Admitting: Podiatry

## 2015-04-30 ENCOUNTER — Ambulatory Visit (INDEPENDENT_AMBULATORY_CARE_PROVIDER_SITE_OTHER): Payer: Medicare Other | Admitting: Podiatry

## 2015-04-30 VITALS — BP 133/75 | HR 66 | Resp 16

## 2015-04-30 DIAGNOSIS — M722 Plantar fascial fibromatosis: Secondary | ICD-10-CM

## 2015-04-30 DIAGNOSIS — L309 Dermatitis, unspecified: Secondary | ICD-10-CM

## 2015-04-30 MED ORDER — TRIAMCINOLONE ACETONIDE 10 MG/ML IJ SUSP
10.0000 mg | Freq: Once | INTRAMUSCULAR | Status: AC
  Administered 2015-04-30: 10 mg

## 2015-04-30 NOTE — Patient Instructions (Signed)

## 2015-04-30 NOTE — Progress Notes (Signed)
   Subjective:    Patient ID: Riley Clarke, male    DOB: 1956/05/21, 59 y.o.   MRN: [id number]  HPI Comments: "I have heel pain in both feet and I'm diabetic"  Patient presents with: Foot Pain: Plantar heel bilateral (L>R) - aching for about 6 months-1 year, AM pain, he is diabetic and takes Lyrica    Foot Pain Associated symptoms include arthralgias, headaches, myalgias and numbness.      Review of Systems  Musculoskeletal: Positive for myalgias, back pain, arthralgias and gait problem.  Neurological: Positive for numbness and headaches.  Hematological: Bruises/bleeds easily.  All other systems reviewed and are negative.      Objective:   Physical Exam        Assessment & Plan:

## 2015-05-01 NOTE — Progress Notes (Signed)
Subjective:     Patient ID: Riley Clarke, male   DOB: 1955/09/14, 59 y.o.   MRN: [id number]  HPI patient states I'm having a lot of pain in both heels at the insertional point tendon calcaneus and I also have dry skin which can become irritated. States it's been present for around 6 months   Review of Systems  All other systems reviewed and are negative.      Objective:   Physical Exam  Constitutional: He is oriented to person, place, and time.  Cardiovascular: Intact distal pulses.   Musculoskeletal: Normal range of motion.  Neurological: He is oriented to person, place, and time.  Skin: Skin is warm.  Nursing note and vitals reviewed.  neurovascular status was found to be intact muscle strength was adequate with range of motion mildly diminished subtalar midtarsal joint and equinus condition noted bilateral. Patient is noted to have exquisite discomfort plantar aspect heel bilateral at the insertion tendon into the calcaneus is noted to have moderate dry skin formation plantar aspect both feet with no drainage or cracking noted. Good digital perfusion and patient well oriented 3     Assessment:     Acute plantar fasciitis bilateral heel with dermatitis-like condition that he needs to use moisturizer on    Plan:     H&P x-rays and condition reviewed with patient. Today injected the plantar fascia bilateral 3 mg Kenalog 5 mg Xylocaine and applied fascial brace bilateral with instructions on usage and along with physical therapy supportive shoes. We utilize Vaseline with Saran wrap on his feet 2 times a week and over-the-counter moisturizer and will be reevaluated in the next several weeks

## 2015-07-01 ENCOUNTER — Ambulatory Visit: Payer: Medicare Other | Admitting: Podiatry

## 2015-07-01 DIAGNOSIS — M4806 Spinal stenosis, lumbar region: Secondary | ICD-10-CM | POA: Diagnosis not present

## 2015-07-05 ENCOUNTER — Ambulatory Visit (INDEPENDENT_AMBULATORY_CARE_PROVIDER_SITE_OTHER): Payer: Medicare Other | Admitting: Family Medicine

## 2015-07-05 ENCOUNTER — Encounter: Payer: Self-pay | Admitting: Family Medicine

## 2015-07-05 VITALS — BP 135/74 | HR 66 | Temp 98.1°F | Ht 73.0 in | Wt 250.0 lb

## 2015-07-05 DIAGNOSIS — K219 Gastro-esophageal reflux disease without esophagitis: Secondary | ICD-10-CM

## 2015-07-05 DIAGNOSIS — R61 Generalized hyperhidrosis: Secondary | ICD-10-CM | POA: Diagnosis not present

## 2015-07-05 DIAGNOSIS — E138 Other specified diabetes mellitus with unspecified complications: Secondary | ICD-10-CM

## 2015-07-05 DIAGNOSIS — Z1159 Encounter for screening for other viral diseases: Secondary | ICD-10-CM | POA: Diagnosis not present

## 2015-07-05 DIAGNOSIS — Z125 Encounter for screening for malignant neoplasm of prostate: Secondary | ICD-10-CM

## 2015-07-05 LAB — COMPLETE METABOLIC PANEL WITH GFR
ALT: 20 U/L (ref 9–46)
AST: 16 U/L (ref 10–35)
Albumin: 4.1 g/dL (ref 3.6–5.1)
Alkaline Phosphatase: 116 U/L — ABNORMAL HIGH (ref 40–115)
BUN: 11 mg/dL (ref 7–25)
CO2: 20 mmol/L (ref 20–31)
Calcium: 9.1 mg/dL (ref 8.6–10.3)
Chloride: 105 mmol/L (ref 98–110)
Creat: 0.87 mg/dL (ref 0.70–1.33)
GFR, Est African American: >89 mL/min (ref >=60)
GFR, Est Non African American: >89 mL/min (ref >=60)
Glucose, Bld: 155 mg/dL — ABNORMAL HIGH (ref 65–99)
Potassium: 3.9 mmol/L (ref 3.5–5.3)
Sodium: 140 mmol/L (ref 135–146)
Total Bilirubin: 0.3 mg/dL (ref 0.2–1.2)
Total Protein: 6.6 g/dL (ref 6.1–8.1)

## 2015-07-05 LAB — LIPID PANEL
Cholesterol: 118 mg/dL — ABNORMAL LOW (ref 125–200)
HDL: 45 mg/dL (ref >=40)
LDL Cholesterol: 24 mg/dL (ref <130)
Total CHOL/HDL Ratio: 2.6 Ratio (ref <=5.0)
Triglycerides: 247 mg/dL — ABNORMAL HIGH (ref <150)
VLDL: 49 mg/dL — ABNORMAL HIGH (ref <30)

## 2015-07-05 LAB — CBC WITH DIFFERENTIAL/PLATELET
Basophils Absolute: 0 10*3/uL (ref 0.0–0.1)
Basophils Relative: 0 % (ref 0–1)
Eosinophils Absolute: 0.4 10*3/uL (ref 0.0–0.7)
Eosinophils Relative: 5 % (ref 0–5)
HCT: 42.8 % (ref 39.0–52.0)
Hemoglobin: 14.5 g/dL (ref 13.0–17.0)
Lymphocytes Relative: 35 % (ref 12–46)
Lymphs Abs: 3 10*3/uL (ref 0.7–4.0)
MCH: 29.2 pg (ref 26.0–34.0)
MCHC: 33.9 g/dL (ref 30.0–36.0)
MCV: 86.3 fL (ref 78.0–100.0)
MPV: 11.4 fL (ref 8.6–12.4)
Monocytes Absolute: 0.6 10*3/uL (ref 0.1–1.0)
Monocytes Relative: 7 % (ref 3–12)
Neutro Abs: 4.5 10*3/uL (ref 1.7–7.7)
Neutrophils Relative %: 53 % (ref 43–77)
Platelets: 216 10*3/uL (ref 150–400)
RBC: 4.96 MIL/uL (ref 4.22–5.81)
RDW: 13.9 % (ref 11.5–15.5)
WBC: 8.5 10*3/uL (ref 4.0–10.5)

## 2015-07-05 MED ORDER — LINAGLIPTIN 5 MG PO TABS: 5.0000 mg | ORAL_TABLET | Freq: Every day | ORAL | Status: DC

## 2015-07-05 MED ORDER — PREGABALIN 300 MG PO CAPS: 300.0000 mg | ORAL_CAPSULE | Freq: Two times a day (BID) | ORAL | Status: DC

## 2015-07-05 MED ORDER — DULAGLUTIDE 1.5 MG/0.5ML ~~LOC~~ SOAJ: 0.5000 mL | SUBCUTANEOUS | Status: DC

## 2015-07-05 MED ORDER — ESOMEPRAZOLE MAGNESIUM 40 MG PO CPDR: 40.0000 mg | DELAYED_RELEASE_CAPSULE | Freq: Every day | ORAL | Status: DC

## 2015-07-05 MED ORDER — METFORMIN HCL 1000 MG PO TABS: 1000.0000 mg | ORAL_TABLET | Freq: Two times a day (BID) | ORAL | Status: DC

## 2015-07-05 MED ORDER — LISINOPRIL 10 MG PO TABS: 10.0000 mg | ORAL_TABLET | Freq: Every day | ORAL | Status: DC

## 2015-07-06 LAB — HEPATITIS C ANTIBODY: HCV Ab: NEGATIVE

## 2015-07-06 LAB — TSH: TSH: 2.164 u[IU]/mL (ref 0.350–4.500)

## 2015-07-06 NOTE — Patient Instructions (Signed)
Continue current regimine Follow-up 3 months and as needed.

## 2015-07-06 NOTE — Progress Notes (Signed)
Patient ID: Riley Clarke, male   DOB: 1955/08/02, 60 y.o.   MRN: [id number]   Chadd Tollison, is a 60 y.o. male  [id number]  [id number]  DOB - Jun 19, 1955  CC:  Chief Complaint  Patient presents with  . Diabetes    follow up for HTN, and Diabetes glucose runnning betwween 175-245 B/P 130/74 most of the time at home       HPI: Riley Clarke is a 60 y.o. male here for follow-up chronic conditions, especially diabetes and hypertension. He reports doing well. He just needs refills and follow-up bloodwork. He does complain of unexplained night sweats and multiple joint pain. He reports his blood sugars at home generally run between 175-245. He reports taking medications as prescribed but does not practice a strict diabetic diet and does not exercise regularly.He is a little vague about what medications he actually takes and it took [place] a while to get this straightened out. He reports having been screened for HIV but not Hep C. His immunizations are up to date. He reports having a neg. Colonoscopy about 3 years ago. Allergies  Allergen Reactions  . Shrimp [Shellfish Allergy]     intolerant   Past Medical History  Diagnosis Date  . Hypertension   . Diabetes mellitus without complication (HCC)   . Headache(784.0)   . Arthritis   . GERD (gastroesophageal reflux disease)    Current Outpatient Prescriptions on File Prior to Visit  Medication Sig Dispense Refill  . glucose blood (ACCU-CHEK AVIVA) test strip Use as instructed 100 each 12  . oxycodone (OXY-IR) 5 MG capsule Take 5 mg by mouth every 4 (four) hours as needed. Reported on 07/05/2015    . traMADol (ULTRAM) 50 MG tablet Take 50 mg by mouth every 6 (six) hours as needed for pain.    . diazepam (VALIUM) 5 MG tablet Take 1-2 tablets (5-10 mg total) by mouth every 6 (six) hours as needed. (Patient not taking: Reported on 04/01/2015) 60 tablet 1  . oxyCODONE-acetaminophen (PERCOCET/ROXICET) 5-325 MG per tablet Take 1 tablet by  mouth every 6 (six) hours as needed for moderate pain or severe pain. (Patient not taking: Reported on 04/01/2015) 20 tablet 0  . sulindac (CLINORIL) 200 MG tablet Take 200 mg by mouth 2 (two) times daily. Reported on 07/05/2015    . Vitamin D, Ergocalciferol, (DRISDOL) 50000 UNITS CAPS capsule Take 50,000 Units by mouth every 7 (seven) days. Reported on 07/05/2015     No current facility-administered medications on file prior to visit.   Family History  Problem Relation Age of Onset  . Heart disease Mother   . Diabetes Mother    Social History   Social History  . Marital Status: Single    Spouse Name: N/A  . Number of Children: N/A  . Years of Education: N/A   Occupational History  . Not on file.   Social History Main Topics  . Smoking status: Current Every Day Smoker -- 0.25 packs/day for 25 years    Types: Cigarettes  . Smokeless tobacco: Not on file     Comment:     . Alcohol Use: 2.4 oz/week    4 Cans of beer per week     Comment: twice a week  . Drug Use: No  . Sexual Activity: Not on file   Other Topics Concern  . Not on file   Social History Narrative    Review of Systems: Constitutional: Negative for fever, chills, appetite change, weight loss,  Fatigue. Positive for night sweats Skin: Negative for rashes or lesions of concern. HENT: Negative for ear pain, ear discharge.nose bleeds Eyes: Negative for pain, discharge, redness, itching and visual disturbance. Neck: Negative for pain, stiffness Respiratory: Negative for cough, shortness of breath,   Cardiovascular: Negative for chest pain, palpitations and leg swelling. Gastrointestinal: Negative for abdominal pain, nausea, vomiting, diarrhea, constipations Genitourinary: Negative for dysuria, urgency, frequency, hematuria,  Musculoskeletal: Positive for multiple joint pain Neurological: Negative for dizziness, tremors, seizures, syncope,   light-headedness, numbness and headaches.  Hematological: Negative for  easy bruising or bleeding Psychiatric/Behavioral: Negative for depression, anxiety, decreased concentration, confusion   Objective:   Filed Vitals:   07/05/15 1511  BP: 135/74  Pulse: 66  Temp: 98.1 F (36.7 C)    Physical Exam: Constitutional: Patient appears well-developed and well-nourished. No distress. HENT: Normocephalic, atraumatic, External right and left ear normal. Oropharynx is clear and moist.  Eyes: Conjunctivae and EOM are normal. PERRLA, no scleral icterus. Neck: Normal ROM. Neck supple. No lymphadenopathy, No thyromegaly. CVS: RRR, S1/S2 +, no murmurs, no gallops, no rubs Pulmonary: Effort and breath sounds normal, no stridor, rhonchi, wheezes, rales.  Abdominal: Soft. Normoactive BS,, no distension, tenderness, rebound or guarding.  Musculoskeletal: Normal range of motion. No edema and no tenderness.  Neuro: Alert.Normal muscle tone coordination. Non-focal Skin: Skin is warm and dry. No rash noted. Not diaphoretic. No erythema. No pallor. Psychiatric: Normal mood and affect. Behavior, judgment, thought content normal.  Lab Results  Component Value Date   WBC 8.5 07/05/2015   HGB 14.5 07/05/2015   HCT 42.8 07/05/2015   MCV 86.3 07/05/2015   PLT 216 07/05/2015   Lab Results  Component Value Date   CREATININE 0.87 07/05/2015   BUN 11 07/05/2015   NA 140 07/05/2015   K 3.9 07/05/2015   CL 105 07/05/2015   CO2 20 07/05/2015    Lab Results  Component Value Date   HGBA1C 7.3* 04/01/2015   Lipid Panel     Component Value Date/Time   CHOL 118* 07/05/2015 1547   TRIG 247* 07/05/2015 1547   HDL 45 07/05/2015 1547   CHOLHDL 2.6 07/05/2015 1547   VLDL 49* 07/05/2015 1547   LDLCALC 24 07/05/2015 1547       Assessment and plan:   1. Diabetes mellitus of other type with complication (HCC)  - COMPLETE METABOLIC PANEL WITH GFR - CBC w/Diff - Lipid panel - metFORMIN (GLUCOPHAGE) 1000 MG tablet; Take 1 tablet (1,000 mg total) by mouth 2 (two) times  daily with a meal. Reported on 07/05/2015  Dispense: 180 tablet; Refill: 1 - lisinopril (PRINIVIL,ZESTRIL) 10 MG tablet; Take 1 tablet (10 mg total) by mouth daily.  Dispense: 90 tablet; Refill: 1 - pregabalin (LYRICA) 300 MG capsule; Take 1 capsule (300 mg total) by mouth 2 (two) times daily.  Dispense: 60 capsule; Refill: 3 - Dulaglutide (TRULICITY) 1.5 MG/0.5ML SOPN; Inject 0.5 mLs into the skin once a week.  Dispense: 4 pen; Refill: 2 - linagliptin (TRADJENTA) 5 MG TABS tablet; Take 1 tablet (5 mg total) by mouth daily.  Dispense: 30 tablet; Refill: 3  2. Need for hepatitis C screening test  - Hepatitis C Antibody  3. Night sweats  - TSH  5. Prostate cancer screening PSA  6. Gastroesophageal reflux disease, esophagitis presence not specified  - esomeprazole (NEXIUM) 40 MG capsule; Take 1 capsule (40 mg total) by mouth daily before breakfast.  Dispense: 90 capsule; Refill: 1   Return in about 3  months (around 10/03/2015) for HTN, Diabetes, A1C.  The patient was given clear instructions to go to ER or return to medical center if symptoms don't improve, worsen or new problems develop. The patient verbalized understanding.    Henrietta Hoover FNP  07/06/2015, 6:35 PM

## 2015-07-08 ENCOUNTER — Ambulatory Visit: Payer: Medicare Other | Admitting: Podiatry

## 2015-07-08 ENCOUNTER — Ambulatory Visit (INDEPENDENT_AMBULATORY_CARE_PROVIDER_SITE_OTHER): Payer: Medicare Other | Admitting: Podiatry

## 2015-07-08 ENCOUNTER — Encounter: Payer: Self-pay | Admitting: Podiatry

## 2015-07-08 VITALS — BP 155/77 | HR 96 | Resp 16

## 2015-07-08 DIAGNOSIS — M722 Plantar fascial fibromatosis: Secondary | ICD-10-CM | POA: Diagnosis not present

## 2015-07-08 MED ORDER — TRIAMCINOLONE ACETONIDE 10 MG/ML IJ SUSP
10.0000 mg | Freq: Once | INTRAMUSCULAR | Status: AC
  Administered 2015-07-08: 10 mg

## 2015-07-08 NOTE — Progress Notes (Signed)
Subjective:     Patient ID: Riley Clarke, male   DOB: March 24, 1956, 60 y.o.   MRN: [id number]  HPI patient states my heels are still hurting on both feet and I know my health is poor there is not a lot of other things we can do and I have bad back   Review of Systems     Objective:   Physical Exam Neurovascular status intact with continued discomfort plantar heel bilateral which did improve for several months followed by reoccurrence    Assessment:     Plantar fasciitis that does have a chronic nature to it bilateral    Plan:     I went ahead today and I injected the plantar fascia bilateral 3 mg Kenalog 5 mill grams Xylocaine and instructed on physical therapy. Reappoint to recheck

## 2015-07-16 ENCOUNTER — Other Ambulatory Visit (INDEPENDENT_AMBULATORY_CARE_PROVIDER_SITE_OTHER): Payer: Medicare Other

## 2015-07-16 DIAGNOSIS — R739 Hyperglycemia, unspecified: Secondary | ICD-10-CM | POA: Diagnosis not present

## 2015-07-16 DIAGNOSIS — R7309 Other abnormal glucose: Secondary | ICD-10-CM | POA: Diagnosis not present

## 2015-07-16 LAB — HEMOGLOBIN A1C
Hgb A1c MFr Bld: 8.1 % — ABNORMAL HIGH (ref <5.7)
Mean Plasma Glucose: 186 mg/dL — ABNORMAL HIGH (ref <117)

## 2015-08-04 ENCOUNTER — Encounter (HOSPITAL_COMMUNITY): Payer: Self-pay | Admitting: *Deleted

## 2015-08-04 DIAGNOSIS — E119 Type 2 diabetes mellitus without complications: Secondary | ICD-10-CM | POA: Diagnosis not present

## 2015-08-04 DIAGNOSIS — G43809 Other migraine, not intractable, without status migrainosus: Secondary | ICD-10-CM | POA: Diagnosis not present

## 2015-08-04 DIAGNOSIS — Z7984 Long term (current) use of oral hypoglycemic drugs: Secondary | ICD-10-CM | POA: Insufficient documentation

## 2015-08-04 DIAGNOSIS — Z79899 Other long term (current) drug therapy: Secondary | ICD-10-CM | POA: Diagnosis not present

## 2015-08-04 DIAGNOSIS — K219 Gastro-esophageal reflux disease without esophagitis: Secondary | ICD-10-CM | POA: Diagnosis not present

## 2015-08-04 DIAGNOSIS — M1990 Unspecified osteoarthritis, unspecified site: Secondary | ICD-10-CM | POA: Diagnosis not present

## 2015-08-04 DIAGNOSIS — H53149 Visual discomfort, unspecified: Secondary | ICD-10-CM | POA: Diagnosis present

## 2015-08-04 DIAGNOSIS — F17210 Nicotine dependence, cigarettes, uncomplicated: Secondary | ICD-10-CM | POA: Diagnosis not present

## 2015-08-04 DIAGNOSIS — I10 Essential (primary) hypertension: Secondary | ICD-10-CM | POA: Insufficient documentation

## 2015-08-04 NOTE — ED Notes (Signed)
Pt c/o migraine x 1 week. States he has taken ibuprofen.

## 2015-08-05 ENCOUNTER — Emergency Department (HOSPITAL_COMMUNITY)
Admission: EM | Admit: 2015-08-05 | Discharge: 2015-08-05 | Disposition: A | Discharge status: Home / Self Care | Payer: Medicare Other | Attending: Emergency Medicine | Admitting: Emergency Medicine

## 2015-08-05 DIAGNOSIS — G43809 Other migraine, not intractable, without status migrainosus: Secondary | ICD-10-CM

## 2015-08-05 MED ORDER — PROCHLORPERAZINE EDISYLATE 5 MG/ML IJ SOLN
10.0000 mg | Freq: Four times a day (QID) | INTRAMUSCULAR | Status: DC | PRN
  Administered 2015-08-05: 10 mg via INTRAVENOUS
  Filled 2015-08-05: qty 2

## 2015-08-05 MED ORDER — MORPHINE SULFATE (PF) 4 MG/ML IV SOLN
6.0000 mg | Freq: Once | INTRAVENOUS | Status: AC
  Administered 2015-08-05: 6 mg via INTRAVENOUS
  Filled 2015-08-05: qty 2

## 2015-08-05 MED ORDER — KETOROLAC TROMETHAMINE 30 MG/ML IJ SOLN
30.0000 mg | Freq: Once | INTRAMUSCULAR | Status: AC
  Administered 2015-08-05: 30 mg via INTRAVENOUS
  Filled 2015-08-05: qty 1

## 2015-08-05 MED ORDER — SODIUM CHLORIDE 0.9 % IV BOLUS (SEPSIS)
1000.0000 mL | Freq: Once | INTRAVENOUS | Status: AC
Start: 2015-08-05 — End: 2015-08-05
  Administered 2015-08-05: 1000 mL via INTRAVENOUS

## 2015-08-05 NOTE — ED Notes (Signed)
MD at bedside. 

## 2015-08-05 NOTE — ED Provider Notes (Signed)
CSN: [id number]     Arrival date & time 08/04/15  2219 History   First MD Initiated Contact with Patient 08/05/15 [phone]     Chief Complaint  Patient presents with  . Migraine     HPI Patient reports headache over the past week.  He states this feels much like his migraines he's had before in the past.  Spent several years has he had a migraine.  He denies nausea vomiting.  No diarrhea.  He does report photophobia and phonophobia.  His pain is moderate in severity.  He's tried over-the-counter pain medications without improvement in his symptoms.   Past Medical History  Diagnosis Date  . Hypertension   . Diabetes mellitus without complication (HCC)   . Headache(784.0)   . Arthritis   . GERD (gastroesophageal reflux disease)    Past Surgical History  Procedure Laterality Date  . Cervical disc surgery Left 99  . Back surgery     Family History  Problem Relation Age of Onset  . Heart disease Mother   . Diabetes Mother    Social History  Substance Use Topics  . Smoking status: Current Every Day Smoker -- 0.25 packs/day for 25 years    Types: Cigarettes  . Smokeless tobacco: None     Comment:     . Alcohol Use: 2.4 oz/week    4 Cans of beer per week     Comment: twice a week    Review of Systems  All other systems reviewed and are negative.     Allergies  Shrimp  Home Medications   Prior to Admission medications   Medication Sig Start Date End Date Taking? Authorizing Provider  diazepam (VALIUM) 5 MG tablet Take 1-2 tablets (5-10 mg total) by mouth every 6 (six) hours as needed. 09/12/12  Yes Julio Sicks, MD  Dulaglutide (TRULICITY) 1.5 MG/0.5ML SOPN Inject 0.5 mLs into the skin once a week. 07/05/15  Yes Henrietta Hoover, NP  esomeprazole (NEXIUM) 40 MG capsule Take 1 capsule (40 mg total) by mouth daily before breakfast. 07/05/15  Yes Henrietta Hoover, NP  glucose blood (ACCU-CHEK AVIVA) test strip Use as instructed 04/05/15   Henrietta Hoover, NP  linagliptin  (TRADJENTA) 5 MG TABS tablet Take 1 tablet (5 mg total) by mouth daily. 07/05/15  Yes Henrietta Hoover, NP  lisinopril (PRINIVIL,ZESTRIL) 10 MG tablet Take 1 tablet (10 mg total) by mouth daily. 07/05/15  Yes Henrietta Hoover, NP  metFORMIN (GLUCOPHAGE) 1000 MG tablet Take 1 tablet (1,000 mg total) by mouth 2 (two) times daily with a meal. Reported on 07/05/2015 07/05/15  Yes Henrietta Hoover, NP  oxycodone (OXY-IR) 5 MG capsule Take 5 mg by mouth every 4 (four) hours as needed. Reported on 07/05/2015   Yes Historical Provider, MD  pregabalin (LYRICA) 300 MG capsule Take 1 capsule (300 mg total) by mouth 2 (two) times daily. 07/05/15  Yes Henrietta Hoover, NP  sulindac (CLINORIL) 200 MG tablet Take 200 mg by mouth 2 (two) times daily. Reported on 07/05/2015   Yes Historical Provider, MD  traMADol (ULTRAM) 50 MG tablet Take 50 mg by mouth every 6 (six) hours as needed for pain.   Yes Historical Provider, MD  Vitamin D, Ergocalciferol, (DRISDOL) 50000 UNITS CAPS capsule Take 50,000 Units by mouth every 7 (seven) days. Reported on 07/05/2015   Yes Historical Provider, MD   BP 124/75 mmHg  Pulse 73  Temp(Src) 98.1 F (36.7 C) (Oral)  Resp 16  Ht  (1.854 m)  Wt 245 lb (111.131 kg)  BMI 32.33 kg/m2  SpO2 94% Physical Exam  Constitutional: He is oriented to person, place, and time. He appears well-developed and well-nourished.  HENT:  Head: Normocephalic and atraumatic.  Eyes: EOM are normal. Pupils are equal, round, and reactive to light.  Neck: Normal range of motion.  Cardiovascular: Normal rate, regular rhythm, normal heart sounds and intact distal pulses.   Pulmonary/Chest: Effort normal and breath sounds normal. No respiratory distress.  Abdominal: Soft. He exhibits no distension. There is no tenderness.  Musculoskeletal: Normal range of motion.  Neurological: He is alert and oriented to person, place, and time.  5/5 strength in major muscle groups of  bilateral upper and lower  extremities. Speech normal. No facial asymetry.   Skin: Skin is warm and dry.  Psychiatric: He has a normal mood and affect. Judgment normal.  Nursing note and vitals reviewed.   ED Course  Procedures (including critical care time) Labs Review Labs Reviewed - No data to display  Imaging Review No results found. I have personally reviewed and evaluated these images and lab results as part of my medical decision-making.   EKG Interpretation None      MDM   Final diagnoses:  None   Typical migraine.  Improved with headache cocktail.  Nonfocal neuro exam.  No indication for imaging.  Well appearing.  Discharge home in good condition.  Primary care follow-up.     Azalia Bilis, MD 08/05/15 [phone]

## 2015-08-05 NOTE — ED Notes (Signed)
Pt left at this time with all belongings.  

## 2015-09-01 ENCOUNTER — Encounter: Payer: Self-pay | Admitting: Podiatry

## 2015-09-01 ENCOUNTER — Ambulatory Visit (INDEPENDENT_AMBULATORY_CARE_PROVIDER_SITE_OTHER): Payer: Medicare Other | Admitting: Podiatry

## 2015-09-01 VITALS — BP 125/80 | HR 91 | Resp 16

## 2015-09-01 DIAGNOSIS — M722 Plantar fascial fibromatosis: Secondary | ICD-10-CM

## 2015-09-01 MED ORDER — TRIAMCINOLONE ACETONIDE 10 MG/ML IJ SUSP
10.0000 mg | Freq: Once | INTRAMUSCULAR | Status: AC
  Administered 2015-09-01: 10 mg

## 2015-09-02 NOTE — Progress Notes (Signed)
Subjective:     Patient ID: Riley Clarke D Bohall, male   DOB: 1955-06-23, 60 y.o.   MRN: [id number]  HPI patient states I been very active and it's flared up again and I'm desperate for medication. I know which earlier than usual but I need something to help   Review of Systems     Objective:   Physical Exam Neurovascular status intact with exquisite discomfort plantar aspect heel bilateral    Assessment:     Acute plantar fasciitis bilateral    Plan:     Injections administered 3 [place] Kenalog 5 g Xylocaine and discussed that like to wait 4-6 months between injections in future

## 2015-10-07 ENCOUNTER — Ambulatory Visit: Payer: Medicare Other | Admitting: Family Medicine

## 2015-10-14 ENCOUNTER — Encounter: Payer: Self-pay | Admitting: Family Medicine

## 2015-10-14 ENCOUNTER — Ambulatory Visit (INDEPENDENT_AMBULATORY_CARE_PROVIDER_SITE_OTHER): Payer: Medicare Other | Admitting: Family Medicine

## 2015-10-14 VITALS — BP 140/75 | HR 89 | Temp 98.4°F | Resp 16 | Ht 73.0 in | Wt 243.0 lb

## 2015-10-14 DIAGNOSIS — E138 Other specified diabetes mellitus with unspecified complications: Secondary | ICD-10-CM

## 2015-10-14 DIAGNOSIS — G629 Polyneuropathy, unspecified: Secondary | ICD-10-CM

## 2015-10-14 LAB — CBC WITH DIFFERENTIAL/PLATELET
Basophils Absolute: 0 cells/uL (ref 0–200)
Basophils Relative: 0 %
Eosinophils Absolute: 146 cells/uL (ref 15–500)
Eosinophils Relative: 2 %
HCT: 46 % (ref 38.5–50.0)
Hemoglobin: 15.6 g/dL (ref 13.2–17.1)
Lymphocytes Relative: 24 %
Lymphs Abs: 1752 cells/uL (ref 850–3900)
MCH: 29.5 pg (ref 27.0–33.0)
MCHC: 33.9 g/dL (ref 32.0–36.0)
MCV: 87.1 fL (ref 80.0–100.0)
MPV: 11 fL (ref 7.5–12.5)
Monocytes Absolute: 584 cells/uL (ref 200–950)
Monocytes Relative: 8 %
Neutro Abs: 4818 cells/uL (ref 1500–7800)
Neutrophils Relative %: 66 %
Platelets: 200 10*3/uL (ref 140–400)
RBC: 5.28 MIL/uL (ref 4.20–5.80)
RDW: 14.5 % (ref 11.0–15.0)
WBC: 7.3 10*3/uL (ref 3.8–10.8)

## 2015-10-14 LAB — COMPLETE METABOLIC PANEL WITH GFR
ALT: 38 U/L (ref 9–46)
AST: 34 U/L (ref 10–35)
Albumin: 4.4 g/dL (ref 3.6–5.1)
Alkaline Phosphatase: 107 U/L (ref 40–115)
BUN: 9 mg/dL (ref 7–25)
CO2: 24 mmol/L (ref 20–31)
Calcium: 9.9 mg/dL (ref 8.6–10.3)
Chloride: 100 mmol/L (ref 98–110)
Creat: 0.78 mg/dL (ref 0.70–1.33)
GFR, Est African American: >89 mL/min (ref >=60)
GFR, Est Non African American: >89 mL/min (ref >=60)
Glucose, Bld: 162 mg/dL — ABNORMAL HIGH (ref 65–99)
Potassium: 4 mmol/L (ref 3.5–5.3)
Sodium: 135 mmol/L (ref 135–146)
Total Bilirubin: 0.4 mg/dL (ref 0.2–1.2)
Total Protein: 7.2 g/dL (ref 6.1–8.1)

## 2015-10-14 LAB — LIPID PANEL
Cholesterol: 151 mg/dL (ref 125–200)
HDL: 53 mg/dL (ref >=40)
LDL Cholesterol: 42 mg/dL (ref <130)
Total CHOL/HDL Ratio: 2.8 Ratio (ref <=5.0)
Triglycerides: 280 mg/dL — ABNORMAL HIGH (ref <150)
VLDL: 56 mg/dL — ABNORMAL HIGH (ref <30)

## 2015-10-14 MED ORDER — GABAPENTIN 300 MG PO CAPS: 300.0000 mg | ORAL_CAPSULE | Freq: Three times a day (TID) | ORAL | Status: DC

## 2015-10-14 NOTE — Progress Notes (Signed)
Patient ID: Riley Clarke D Prest, male   DOB: 1955/10/24, 60 y.o.   MRN: [id number]   Riley NoseBryant Vitiello, is a 60 y.o. male  [id number]  [id number]  DOB - 1955/10/24  CC:  Chief Complaint  Patient presents with  . Diabetes       HPI: Riley Clarke is a 60 y.o. male here to follow-up on diabetes and hypertension. His complaints today are of lower back and pelvic pain, as well as pain in legs and feet.  He is seeing a podiatrist about is feet. He reports the numbness and tingling of his feet and legs is not being controlled with Lyrica. He says he has never been tried on gabapentin. His other complaint is of a frequent cough. He reports eating a health diet and walking daily.  Health Maintenance:  He is in need of diabetic eye exam, diabetic foot exam. Urine micral is UTD, colonoscopy is UTD, will need 2nd pneumonia in October. HIV will be done today  Allergies  Allergen Reactions  . Shrimp [Shellfish Allergy]     intolerant   Past Medical History  Diagnosis Date  . Hypertension   . Diabetes mellitus without complication (HCC)   . Headache(784.0)   . Arthritis   . GERD (gastroesophageal reflux disease)    Current Outpatient Prescriptions on File Prior to Visit  Medication Sig Dispense Refill  . diazepam (VALIUM) 5 MG tablet Take 1-2 tablets (5-10 mg total) by mouth every 6 (six) hours as needed. 60 tablet 1  . Dulaglutide (TRULICITY) 1.5 MG/0.5ML SOPN Inject 0.5 mLs into the skin once a week. 4 pen 2  . esomeprazole (NEXIUM) 40 MG capsule Take 1 capsule (40 mg total) by mouth daily before breakfast. 90 capsule 1  . glucose blood (ACCU-CHEK AVIVA) test strip Use as instructed 100 each 12  . linagliptin (TRADJENTA) 5 MG TABS tablet Take 1 tablet (5 mg total) by mouth daily. 30 tablet 3  . lisinopril (PRINIVIL,ZESTRIL) 10 MG tablet Take 1 tablet (10 mg total) by mouth daily. 90 tablet 1  . metFORMIN (GLUCOPHAGE) 1000 MG tablet Take 1 tablet (1,000 mg total) by mouth 2 (two) times  daily with a meal. Reported on 07/05/2015 180 tablet 1  . oxycodone (OXY-IR) 5 MG capsule Take 5 mg by mouth every 4 (four) hours as needed. Reported on 07/05/2015    . Vitamin D, Ergocalciferol, (DRISDOL) 50000 UNITS CAPS capsule Take 50,000 Units by mouth every 7 (seven) days. Reported on 07/05/2015    . sulindac (CLINORIL) 200 MG tablet Take 200 mg by mouth 2 (two) times daily. Reported on 10/14/2015    . traMADol (ULTRAM) 50 MG tablet Take 50 mg by mouth every 6 (six) hours as needed for pain. Reported on 10/14/2015     No current facility-administered medications on file prior to visit.   Family History  Problem Relation Age of Onset  . Heart disease Mother   . Diabetes Mother    Social History   Social History  . Marital Status: Single    Spouse Name: N/A  . Number of Children: N/A  . Years of Education: N/A   Occupational History  . Not on file.   Social History Main Topics  . Smoking status: Current Every Day Smoker -- 0.25 packs/day for 25 years    Types: Cigarettes  . Smokeless tobacco: Not on file     Comment:     . Alcohol Use: 2.4 oz/week    4 Cans of beer per week  Comment: twice a week  . Drug Use: No  . Sexual Activity: Not on file   Other Topics Concern  . Not on file   Social History Narrative    Review of Systems: Constitutional: Negative for fever, chills, appetite change, weight loss,  Fatigue. Skin: Negative for rashes or lesions of concern. HENT: Negative for ear pain, ear discharge.nose bleeds Eyes: Negative for pain, discharge, redness, itching and visual disturbance. Neck: Negative for pain, stiffness Respiratory: Negative for shortness of breath, wheezing. Positive for frequent cough  Cardiovascular: Negative for chest pain, palpitations. Some swelling of feet and legs. Gastrointestinal: Negative for abdominal pain, nausea, vomiting, diarrhea, constipations Genitourinary: Negative for dysuria, urgency, frequency, hematuria,  Musculoskeletal:  Positive for back pain, joint pain, joint  swelling, and gait problem.Negative for weakness.Positive for pain in feet and legs Neurological: Negative for dizziness, tremors, seizures, syncope,   light-headedness, numbness and headaches.  Hematological: Negative for easy bruising or bleeding Psychiatric/Behavioral: Negative for depression, anxiety, decreased concentration, confusion   Objective:   Filed Vitals:   10/14/15 1323  BP: 140/75  Pulse: 89  Temp: 98.4 F (36.9 C)  Resp: 16    Physical Exam: Constitutional: Patient appears well-developed and well-nourished. No distress. HENT: Normocephalic, atraumatic, External right and left ear normal. Oropharynx is clear and moist.  Eyes: Conjunctivae and EOM are normal. PERRLA, no scleral icterus. Neck: Normal ROM. Neck supple. No lymphadenopathy, No thyromegaly. CVS: RRR, S1/S2 +, no murmurs, no gallops, no rubs Pulmonary: Effort and breath sounds normal, no stridor, rhonchi, wheezes, rales.  Abdominal: Soft. Normoactive BS,, no distension, tenderness, rebound or guarding.  Musculoskeletal: Normal range of motion. No edema and no tenderness.  Neuro: Alert.Normal muscle tone coordination. Non-focal Skin: Skin is warm and dry. No rash noted. Not diaphoretic. No erythema. No pallor. Psychiatric: Normal mood and affect. Behavior, judgment, thought content normal.  Lab Results  Component Value Date   WBC 8.5 07/05/2015   HGB 14.5 07/05/2015   HCT 42.8 07/05/2015   MCV 86.3 07/05/2015   PLT 216 07/05/2015   Lab Results  Component Value Date   CREATININE 0.87 07/05/2015   BUN 11 07/05/2015   NA 140 07/05/2015   K 3.9 07/05/2015   CL 105 07/05/2015   CO2 20 07/05/2015    Lab Results  Component Value Date   HGBA1C 8.1* 07/16/2015   Lipid Panel     Component Value Date/Time   CHOL 118* 07/05/2015 1547   TRIG 247* 07/05/2015 1547   HDL 45 07/05/2015 1547   CHOLHDL 2.6 07/05/2015 1547   VLDL 49* 07/05/2015 1547   LDLCALC  24 07/05/2015 1547       Assessment and plan:   1. Diabetes mellitus of other type with complication (HCC)  - Hemoglobin A1c - COMPLETE METABOLIC PANEL WITH GFR - CBC with Differential - Lipid panel - HIV antibody (with reflex)  2. Neuropathy (HCC) -Switch from Lyrica to gabapentin. New prescription for 300 tid placed.   Return in about 3 months (around 01/14/2016) for HTN, Diabetes, A1C.  The patient was given clear instructions to go to ER or return to medical center if symptoms don't improve, worsen or new problems develop. The patient verbalized understanding.    Henrietta Hoover FNP  10/14/2015, 4:03 PM

## 2015-10-14 NOTE — Patient Instructions (Signed)
We will let you know if any change needed in diabetes treatment.

## 2015-10-15 LAB — HIV ANTIBODY (ROUTINE TESTING W REFLEX): HIV 1&2 Ab, 4th Generation: NONREACTIVE

## 2015-10-15 LAB — HEMOGLOBIN A1C
Hgb A1c MFr Bld: 7.7 % — ABNORMAL HIGH (ref <5.7)
Mean Plasma Glucose: 174 mg/dL

## 2015-10-20 ENCOUNTER — Telehealth: Payer: Self-pay | Admitting: *Deleted

## 2015-10-20 NOTE — Telephone Encounter (Signed)
Medical Assistant left message on patient's home and cell voicemail. Voicemail states to give a call back to Annah Jasko with [facility] at [phone].  

## 2015-10-20 NOTE — Telephone Encounter (Signed)
Patient returned nurse phone call.

## 2015-10-20 NOTE — Telephone Encounter (Signed)
-----   Message from Henrietta HooverLinda C Bernhardt, NP sent at 10/15/2015  8:15 AM EDT ----- HIV negative. A1C 7.7. Little better than 3 months ago. Are you taking trulicity, trajenda and metformin regularly as prescribed.Triglycerides 280 but not fasting.CBC

## 2015-11-01 ENCOUNTER — Other Ambulatory Visit: Payer: Self-pay | Admitting: Family Medicine

## 2015-11-10 DIAGNOSIS — M4806 Spinal stenosis, lumbar region: Secondary | ICD-10-CM | POA: Diagnosis not present

## 2015-11-18 ENCOUNTER — Ambulatory Visit (INDEPENDENT_AMBULATORY_CARE_PROVIDER_SITE_OTHER): Payer: Medicare Other | Admitting: Podiatry

## 2015-11-18 ENCOUNTER — Encounter: Payer: Self-pay | Admitting: Podiatry

## 2015-11-18 DIAGNOSIS — M722 Plantar fascial fibromatosis: Secondary | ICD-10-CM

## 2015-11-18 MED ORDER — TRIAMCINOLONE ACETONIDE 10 MG/ML IJ SUSP
10.0000 mg | Freq: Once | INTRAMUSCULAR | Status: AC
  Administered 2015-11-18: 10 mg

## 2015-11-19 NOTE — Progress Notes (Signed)
Subjective:     Patient ID: Riley Clarke, male   DOB: 1956/05/21, 60 y.o.   MRN: [id number]  HPI patient presents with significant pain in the plantar fascial bilateral. He would like to get 1 more injection prior to the summer and then is can to try to wait 4-6 months for next injection. Overall tolerates injections well with no issues   Review of Systems     Objective:   Physical Exam Neurovascular status intact with continued discomfort plantar heel region bilateral with good digital perfusion noted    Assessment:     Acute plantar fasciitis bilateral    Plan:     Reinjected the plantar fascia bilateral 3 mg Kenalog 5 mill grams Xylocaine advised on physical therapy shoe gear modification not going barefoot and will be seen back to recheck

## 2015-11-29 DIAGNOSIS — H2513 Age-related nuclear cataract, bilateral: Secondary | ICD-10-CM | POA: Diagnosis not present

## 2015-11-29 DIAGNOSIS — E119 Type 2 diabetes mellitus without complications: Secondary | ICD-10-CM | POA: Diagnosis not present

## 2015-11-29 DIAGNOSIS — H35033 Hypertensive retinopathy, bilateral: Secondary | ICD-10-CM | POA: Diagnosis not present

## 2015-11-29 DIAGNOSIS — H25013 Cortical age-related cataract, bilateral: Secondary | ICD-10-CM | POA: Diagnosis not present

## 2015-12-03 ENCOUNTER — Telehealth: Payer: Self-pay | Admitting: *Deleted

## 2015-12-03 NOTE — Telephone Encounter (Signed)
Medical Assistant left message on patient's home and cell voicemail. Voicemail states to give a call back to [place] [facility] at [phone].  !!!Communication letter has been sent!!!

## 2015-12-15 ENCOUNTER — Encounter: Payer: Self-pay | Admitting: Family Medicine

## 2016-01-14 ENCOUNTER — Ambulatory Visit: Payer: Medicare Other | Admitting: Family Medicine

## 2016-02-04 ENCOUNTER — Ambulatory Visit (INDEPENDENT_AMBULATORY_CARE_PROVIDER_SITE_OTHER): Payer: Medicare Other | Admitting: Family Medicine

## 2016-02-04 ENCOUNTER — Encounter: Payer: Self-pay | Admitting: Family Medicine

## 2016-02-04 VITALS — BP 139/72 | HR 90 | Temp 98.4°F | Resp 14 | Ht 73.0 in | Wt 231.0 lb

## 2016-02-04 DIAGNOSIS — E119 Type 2 diabetes mellitus without complications: Secondary | ICD-10-CM | POA: Diagnosis not present

## 2016-02-04 DIAGNOSIS — K219 Gastro-esophageal reflux disease without esophagitis: Secondary | ICD-10-CM

## 2016-02-04 DIAGNOSIS — Z794 Long term (current) use of insulin: Secondary | ICD-10-CM | POA: Diagnosis not present

## 2016-02-04 DIAGNOSIS — Z23 Encounter for immunization: Secondary | ICD-10-CM

## 2016-02-04 DIAGNOSIS — E138 Other specified diabetes mellitus with unspecified complications: Secondary | ICD-10-CM

## 2016-02-04 LAB — POCT GLYCOSYLATED HEMOGLOBIN (HGB A1C): Hemoglobin A1C: 6.6

## 2016-02-04 LAB — LIPID PANEL
Cholesterol: 116 mg/dL — ABNORMAL LOW (ref 125–200)
HDL: 59 mg/dL (ref >=40)
LDL Cholesterol: 32 mg/dL (ref <130)
Total CHOL/HDL Ratio: 2 Ratio (ref <=5.0)
Triglycerides: 124 mg/dL (ref <150)
VLDL: 25 mg/dL (ref <30)

## 2016-02-04 LAB — COMPLETE METABOLIC PANEL WITH GFR
ALT: 40 U/L (ref 9–46)
AST: 28 U/L (ref 10–35)
Albumin: 4 g/dL (ref 3.6–5.1)
Alkaline Phosphatase: 83 U/L (ref 40–115)
BUN: 12 mg/dL (ref 7–25)
CO2: 24 mmol/L (ref 20–31)
Calcium: 9.6 mg/dL (ref 8.6–10.3)
Chloride: 106 mmol/L (ref 98–110)
Creat: 0.76 mg/dL (ref 0.70–1.33)
GFR, Est African American: >89 mL/min (ref >=60)
GFR, Est Non African American: >89 mL/min (ref >=60)
Glucose, Bld: 121 mg/dL — ABNORMAL HIGH (ref 65–99)
Potassium: 4 mmol/L (ref 3.5–5.3)
Sodium: 140 mmol/L (ref 135–146)
Total Bilirubin: 0.3 mg/dL (ref 0.2–1.2)
Total Protein: 6.5 g/dL (ref 6.1–8.1)

## 2016-02-04 MED ORDER — LISINOPRIL 10 MG PO TABS: 10.0000 mg | ORAL_TABLET | Freq: Every day | ORAL | 1 refills | Status: DC

## 2016-02-04 MED ORDER — LINAGLIPTIN 5 MG PO TABS: 5.0000 mg | ORAL_TABLET | Freq: Every day | ORAL | 3 refills | Status: DC

## 2016-02-04 MED ORDER — DULAGLUTIDE 1.5 MG/0.5ML ~~LOC~~ SOAJ: SUBCUTANEOUS | 2 refills | Status: DC

## 2016-02-04 MED ORDER — ESOMEPRAZOLE MAGNESIUM 40 MG PO CPDR: 40.0000 mg | DELAYED_RELEASE_CAPSULE | Freq: Every day | ORAL | 1 refills | Status: DC

## 2016-02-04 MED ORDER — METFORMIN HCL 1000 MG PO TABS: 1000.0000 mg | ORAL_TABLET | Freq: Two times a day (BID) | ORAL | 1 refills | Status: DC

## 2016-02-07 ENCOUNTER — Ambulatory Visit (INDEPENDENT_AMBULATORY_CARE_PROVIDER_SITE_OTHER): Payer: Medicare Other | Admitting: Podiatry

## 2016-02-07 ENCOUNTER — Encounter: Payer: Self-pay | Admitting: Podiatry

## 2016-02-07 VITALS — BP 154/80 | HR 61 | Resp 16

## 2016-02-07 DIAGNOSIS — M722 Plantar fascial fibromatosis: Secondary | ICD-10-CM

## 2016-02-07 MED ORDER — TRIAMCINOLONE ACETONIDE 10 MG/ML IJ SUSP
10.0000 mg | Freq: Once | INTRAMUSCULAR | Status: AC
  Administered 2016-02-07: 10 mg

## 2016-02-07 NOTE — Progress Notes (Signed)
Riley Clarke, is a 60 y.o. male  [id number]:[id number]  ION:[id number]  DOB - Oct 17, 1955  CC:  Chief Complaint  Patient presents with  . Diabetes  . Peripheral Neuropathy       HPI: Riley Clarke is a 60 y.o. male here for follow-up diabetes. He is currently on metformin, Tradjenta and Truliant. His last A1C in May was 7.7. Today is 6.6. He reports watching carbs except for occassional sweets. He does a "little" walking. He does have back problems and walks with a cane. He reports smoking 1-2 cigarettes a week and having 1-2 alcoholic  drinks a week. He does need some refills today.  Health Maintenance:He is in need of flu shot. He reports having a diabetic eye exam earlier this year. He is in need of colon cancer screening and has not been screened for prostate cancer.  Allergies  Allergen Reactions  . Shrimp [Shellfish Allergy]     intolerant   Past Medical History:  Diagnosis Date  . Arthritis   . Diabetes mellitus without complication (HCC)   . GERD (gastroesophageal reflux disease)   . Headache(784.0)   . Hypertension    Current Outpatient Prescriptions on File Prior to Visit  Medication Sig Dispense Refill  . oxycodone (OXY-IR) 5 MG capsule Take 5 mg by mouth every 4 (four) hours as needed. Reported on 07/05/2015    . diazepam (VALIUM) 5 MG tablet Take 1-2 tablets (5-10 mg total) by mouth every 6 (six) hours as needed. (Patient not taking: Reported on 02/04/2016) 60 tablet 1  . gabapentin (NEURONTIN) 300 MG capsule Take 1 capsule (300 mg total) by mouth 3 (three) times daily. (Patient not taking: Reported on 02/04/2016) 90 capsule 3  . glucose blood (ACCU-CHEK AVIVA) test strip Use as instructed (Patient not taking: Reported on 02/04/2016) 100 each 12  . sulindac (CLINORIL) 200 MG tablet Take 200 mg by mouth 2 (two) times daily. Reported on 10/14/2015    . traMADol (ULTRAM) 50 MG tablet Take 50 mg by mouth every 6 (six) hours as needed for pain. Reported on 10/14/2015    . Vitamin  D, Ergocalciferol, (DRISDOL) 50000 UNITS CAPS capsule Take 50,000 Units by mouth every 7 (seven) days. Reported on 07/05/2015     No current facility-administered medications on file prior to visit.    Family History  Problem Relation Age of Onset  . Heart disease Mother   . Diabetes Mother    Social History   Social History  . Marital status: Single    Spouse name: N/A  . Number of children: N/A  . Years of education: N/A   Occupational History  . Not on file.   Social History Main Topics  . Smoking status: Current Every Day Smoker    Packs/day: 0.25    Years: 25.00    Types: Cigarettes  . Smokeless tobacco: Not on file     Comment:     . Alcohol use 2.4 oz/week    4 Cans of beer per week     Comment: twice a week  . Drug use: No  . Sexual activity: Not on file   Other Topics Concern  . Not on file   Social History Narrative  . No narrative on file    Review of Systems: Constitutional: Negative for fever, chills, appetite change, weight loss,  Fatigue. Skin: Negative for rashes or lesions of concern. HENT: Negative for ear pain, ear discharge.nose bleeds Eyes: Negative for pain, discharge, redness, itching and visual disturbance.  Neck: Negative for pain, stiffness Respiratory: Negative for cough, shortness of breath,   Cardiovascular: Negative for chest pain, palpitations and leg swelling. Gastrointestinal: Negative for abdominal pain, nausea, vomiting, diarrhea, constipations. Positive for heartburn Genitourinary: Negative for dysuria, urgency, frequency, hematuria,  Musculoskeletal: Positive for back pain, negative for other joint pain, joint  swelling, and gait problem.Negative for weakness. Neurological: Negative for dizziness, tremors, seizures, syncope,   light-headedness, numbness and headaches.  Hematological: Negative for easy bruising or bleeding Psychiatric/Behavioral: Negative for depression, anxiety, decreased concentration, confusion   Objective:    Vitals:   02/04/16 1311  BP: 139/72  Pulse: 90  Resp: 14  Temp: 98.4 F (36.9 C)    Physical Exam: Constitutional: Patient appears well-developed and well-nourished. No distress. HENT: Normocephalic, atraumatic, External right and left ear normal. Oropharynx is clear and moist.  Eyes: Conjunctivae and EOM are normal. PERRLA, no scleral icterus. Neck: Normal ROM. Neck supple. No lymphadenopathy, No thyromegaly. CVS: RRR, S1/S2 +, no murmurs, no gallops, no rubs Pulmonary: Effort and breath sounds normal, no stridor, rhonchi, wheezes, rales.  Abdominal: Soft. Normoactive BS,, no distension, tenderness, rebound or guarding.  Musculoskeletal: Normal range of motion. No edema and no tenderness.  Neuro: Alert.Normal muscle tone coordination. Non-focal Skin: Skin is warm and dry. No rash noted. Not diaphoretic. No erythema. No pallor. Psychiatric: Normal mood and affect. Behavior, judgment, thought content normal.  Lab Results  Component Value Date   WBC 7.3 10/14/2015   HGB 15.6 10/14/2015   HCT 46.0 10/14/2015   MCV 87.1 10/14/2015   PLT 200 10/14/2015   Lab Results  Component Value Date   CREATININE 0.76 02/04/2016   BUN 12 02/04/2016   NA 140 02/04/2016   K 4.0 02/04/2016   CL 106 02/04/2016   CO2 24 02/04/2016    Lab Results  Component Value Date   HGBA1C 6.6 02/04/2016   Lipid Panel     Component Value Date/Time   CHOL 116 (L) 02/04/2016 1351   TRIG 124 02/04/2016 1351   HDL 59 02/04/2016 1351   CHOLHDL 2.0 02/04/2016 1351   VLDL 25 02/04/2016 1351   LDLCALC 32 02/04/2016 1351       Assessment and plan:   1. Type 2 diabetes mellitus without complication, with long-term current use of insulin (HCC)  - HgB A1c - COMPLETE METABOLIC PANEL WITH GFR - Lipid panel  metFORMIN (GLUCOPHAGE) 1000 MG tablet; Take 1 tablet (1,000 mg total) by mouth 2 (two) times daily with a meal. Reported on 07/05/2015  Dispense: 180 tablet; Refill: 1 - lisinopril  (PRINIVIL,ZESTRIL) 10 MG tablet; Take 1 tablet (10 mg total) by mouth daily.  Dispense: 90 tablet; Refill: 1 - linagliptin (TRADJENTA) 5 MG TABS tablet; Take 1 tablet (5 mg total) by mouth daily.  Dispense: 30 tablet; Refill: 3   - 3. Gastroesophageal reflux disease, esophagitis presence not specified  - esomeprazole (NEXIUM) 40 MG capsule; Take 1 capsule (40 mg total) by mouth daily before breakfast.  Dispense: 90 capsule; Refill: 1  4. Encounter for immunization  - Was given flu shot tody.   No Follow-up on file.  The patient was given clear instructions to go to ER or return to medical center if symptoms don't improve, worsen or new problems develop. The patient verbalized understanding.    Henrietta Hoover FNP  02/07/2016, 8:32 AM

## 2016-02-07 NOTE — Patient Instructions (Signed)
Continue working on diet and exercise.

## 2016-02-09 NOTE — Progress Notes (Signed)
Subjective:     Patient ID: Riley Clarke, male   DOB: 10-Jan-1956, 60 y.o.   MRN: [id number]  HPI patient presents stating that his heels are bothering him again   Review of Systems     Objective:   Physical Exam Neurovascular status intact with pain in the plantar fascia both feet    Assessment:     Plantar fasciitis bilateral with inflammation    Plan:     Patient will need to go longer periods of time but since she's an acute pain and he needs to be on his feet I did go ahead today and I injected the plantar fascia bilateral 3 mg Kenalog 5 mg Xylocaine

## 2016-02-21 DIAGNOSIS — M4806 Spinal stenosis, lumbar region: Secondary | ICD-10-CM | POA: Diagnosis not present

## 2016-02-21 DIAGNOSIS — M47816 Spondylosis without myelopathy or radiculopathy, lumbar region: Secondary | ICD-10-CM | POA: Diagnosis not present

## 2016-02-21 DIAGNOSIS — M5416 Radiculopathy, lumbar region: Secondary | ICD-10-CM | POA: Diagnosis not present

## 2016-02-21 DIAGNOSIS — F1120 Opioid dependence, uncomplicated: Secondary | ICD-10-CM | POA: Insufficient documentation

## 2016-02-21 DIAGNOSIS — M5412 Radiculopathy, cervical region: Secondary | ICD-10-CM | POA: Insufficient documentation

## 2016-02-23 ENCOUNTER — Other Ambulatory Visit: Payer: Self-pay | Admitting: Physical Medicine and Rehabilitation

## 2016-02-23 DIAGNOSIS — M5412 Radiculopathy, cervical region: Secondary | ICD-10-CM

## 2016-02-28 ENCOUNTER — Other Ambulatory Visit: Payer: Self-pay | Admitting: Family Medicine

## 2016-02-28 MED ORDER — EMPAGLIFLOZIN 25 MG PO TABS: 25.0000 mg | ORAL_TABLET | Freq: Every day | ORAL | 1 refills | Status: DC

## 2016-03-01 DIAGNOSIS — Z5181 Encounter for therapeutic drug level monitoring: Secondary | ICD-10-CM | POA: Diagnosis not present

## 2016-03-01 DIAGNOSIS — Z79899 Other long term (current) drug therapy: Secondary | ICD-10-CM | POA: Diagnosis not present

## 2016-03-01 DIAGNOSIS — M5416 Radiculopathy, lumbar region: Secondary | ICD-10-CM | POA: Diagnosis not present

## 2016-03-01 DIAGNOSIS — M4806 Spinal stenosis, lumbar region: Secondary | ICD-10-CM | POA: Diagnosis not present

## 2016-03-02 ENCOUNTER — Ambulatory Visit
Admission: RE | Admit: 2016-03-02 | Discharge: 2016-03-02 | Disposition: A | Discharge status: Home / Self Care | Payer: Medicare Other | Source: Ambulatory Visit | Attending: Physical Medicine and Rehabilitation | Admitting: Physical Medicine and Rehabilitation

## 2016-03-02 DIAGNOSIS — M50222 Other cervical disc displacement at C5-C6 level: Secondary | ICD-10-CM | POA: Diagnosis not present

## 2016-03-02 DIAGNOSIS — M5412 Radiculopathy, cervical region: Secondary | ICD-10-CM

## 2016-03-09 DIAGNOSIS — M542 Cervicalgia: Secondary | ICD-10-CM | POA: Diagnosis not present

## 2016-03-21 DIAGNOSIS — M4802 Spinal stenosis, cervical region: Secondary | ICD-10-CM | POA: Diagnosis not present

## 2016-03-27 DIAGNOSIS — M542 Cervicalgia: Secondary | ICD-10-CM | POA: Diagnosis not present

## 2016-03-27 DIAGNOSIS — M47816 Spondylosis without myelopathy or radiculopathy, lumbar region: Secondary | ICD-10-CM | POA: Diagnosis not present

## 2016-03-27 DIAGNOSIS — M4802 Spinal stenosis, cervical region: Secondary | ICD-10-CM | POA: Diagnosis not present

## 2016-03-27 DIAGNOSIS — Z79899 Other long term (current) drug therapy: Secondary | ICD-10-CM | POA: Diagnosis not present

## 2016-05-02 DIAGNOSIS — R030 Elevated blood-pressure reading, without diagnosis of hypertension: Secondary | ICD-10-CM | POA: Diagnosis not present

## 2016-05-02 DIAGNOSIS — M4802 Spinal stenosis, cervical region: Secondary | ICD-10-CM | POA: Diagnosis not present

## 2016-05-08 ENCOUNTER — Encounter: Payer: Self-pay | Admitting: Family Medicine

## 2016-05-08 ENCOUNTER — Ambulatory Visit (INDEPENDENT_AMBULATORY_CARE_PROVIDER_SITE_OTHER): Payer: Medicare Other | Admitting: Family Medicine

## 2016-05-08 VITALS — BP 143/69 | HR 71 | Temp 97.7°F | Resp 12 | Ht 73.0 in | Wt 235.0 lb

## 2016-05-08 DIAGNOSIS — Z23 Encounter for immunization: Secondary | ICD-10-CM | POA: Diagnosis not present

## 2016-05-08 MED ORDER — PNEUMOCOCCAL VAC POLYVALENT 25 MCG/0.5ML IJ INJ
0.5000 mL | INJECTION | INTRAMUSCULAR | Status: AC | PRN
  Administered 2016-05-08: 0.5 mL via INTRAMUSCULAR

## 2016-05-08 MED ORDER — PNEUMOCOCCAL VAC POLYVALENT 25 MCG/0.5ML IJ INJ: 0.5000 mL | INJECTION | INTRAMUSCULAR | Status: DC

## 2016-05-08 MED ORDER — TRAMADOL HCL 50 MG PO TABS: 50.0000 mg | ORAL_TABLET | Freq: Two times a day (BID) | ORAL | 0 refills | Status: DC | PRN

## 2016-05-08 NOTE — Progress Notes (Signed)
Riley Clarke, is a 60 y.o. male  [id number]  [id number]:[id number]:[id number]  MRN:[id number]  DOB - 1955-06-27  CC:  Chief Complaint  Patient presents with  . follow up    3 month follow up for diabetes, blood sugar at home is no higher than 175, would like refill tramodol for back pain       HPI: Riley NoseBryant Hutchins is a 60 y.o. male here with a complaint of low back pain. He has had surgery in past and has 6 screws and a plate in his back. He reports being in daily pain and request a refill of Tramadol which he has used in the past. He was seen her about 3 months ago and his A1C was 6.6 so we will not address his diabetes today. He does need a pneumonia 23 and needs screening for colon cancer. He has a diabetic eye exam scheduled.    Allergies  Allergen Reactions  . Shrimp [Shellfish Allergy]     intolerant   Past Medical History:  Diagnosis Date  . Arthritis   . Diabetes mellitus without complication (HCC)   . GERD (gastroesophageal reflux disease)   . Headache(784.0)   . Hypertension    Current Outpatient Prescriptions on File Prior to Visit  Medication Sig Dispense Refill  . Dulaglutide (TRULICITY) 1.5 MG/0.5ML SOPN INJECT 0.5MLS INTO THE SKIN ONCE A WEEK 2 mL 2  . empagliflozin (JARDIANCE) 25 MG TABS tablet Take 25 mg by mouth daily. 90 tablet 1  . esomeprazole (NEXIUM) 40 MG capsule Take 1 capsule (40 mg total) by mouth daily before breakfast. 90 capsule 1  . gabapentin (NEURONTIN) 300 MG capsule Take 1 capsule (300 mg total) by mouth 3 (three) times daily. 90 capsule 3  . glucose blood (ACCU-CHEK AVIVA) test strip Use as instructed 100 each 12  . linagliptin (TRADJENTA) 5 MG TABS tablet Take 1 tablet (5 mg total) by mouth daily. 30 tablet 3  . lisinopril (PRINIVIL,ZESTRIL) 10 MG tablet Take 1 tablet (10 mg total) by mouth daily. 90 tablet 1  . metFORMIN (GLUCOPHAGE) 1000 MG tablet Take 1 tablet (1,000 mg total) by mouth 2 (two) times daily with a meal. Reported on 07/05/2015 180 tablet 1  .  oxycodone (OXY-IR) 5 MG capsule Take 5 mg by mouth every 4 (four) hours as needed. Reported on 07/05/2015    . sulindac (CLINORIL) 200 MG tablet Take 200 mg by mouth 2 (two) times daily. Reported on 10/14/2015    . Vitamin D, Ergocalciferol, (DRISDOL) 50000 UNITS CAPS capsule Take 50,000 Units by mouth every 7 (seven) days. Reported on 07/05/2015    . diazepam (VALIUM) 5 MG tablet Take 1-2 tablets (5-10 mg total) by mouth every 6 (six) hours as needed. (Patient not taking: Reported on 05/08/2016) 60 tablet 1   No current facility-administered medications on file prior to visit.    Family History  Problem Relation Age of Onset  . Heart disease Mother   . Diabetes Mother    Social History   Social History  . Marital status: Single    Spouse name: N/A  . Number of children: N/A  . Years of education: N/A   Occupational History  . Not on file.   Social History Main Topics  . Smoking status: Current Every Day Smoker    Packs/day: 0.25    Years: 25.00    Types: Cigarettes  . Smokeless tobacco: Never Used     Comment:     . Alcohol use 2.4 oz/week  4 Cans of beer per week     Comment: twice a week  . Drug use: No  . Sexual activity: Not on file   Other Topics Concern  . Not on file   Social History Narrative  . No narrative on file    Review of Systems: Constitutional: Negative Skin: Negative HENT: Negative  Eyes: Negative  Neck: Negative Respiratory: Negative Cardiovascular: Negative Gastrointestinal: Negative Genitourinary: Negative  Musculoskeletal: + for low back pain Neurological: Negative for Hematological: Negative  Psychiatric/Behavioral: Negative    Objective:   Vitals:   05/08/16 1500  BP: (!) 143/69  Pulse: 71  Resp: 12  Temp: 97.7 F (36.5 C)    Physical Exam: Constitutional: Patient appears well-developed and well-nourished. No distress. HENT: Normocephalic, atraumatic, External right and left ear normal. Oropharynx is clear and moist.   Eyes: Conjunctivae and EOM are normal. PERRLA, no scleral icterus. Neck: Normal ROM. Neck supple. No lymphadenopathy, No thyromegaly. CVS: RRR, S1/S2 +, no murmurs, no gallops, no rubs Pulmonary: Effort and breath sounds normal, no stridor, rhonchi, wheezes, rales.  Abdominal: Soft. Normoactive BS,, no distension, tenderness, rebound or guarding.  Musculoskeletal: Normal range of motion. No edema. There is tenderness over the lower back Neuro: Alert.Normal muscle tone coordination. Non-focal Skin: Skin is warm and dry. No rash noted. Not diaphoretic. No erythema. No pallor. Psychiatric: Normal mood and affect. Behavior, judgment, thought content normal.  Lab Results  Component Value Date   WBC 7.3 10/14/2015   HGB 15.6 10/14/2015   HCT 46.0 10/14/2015   MCV 87.1 10/14/2015   PLT 200 10/14/2015   Lab Results  Component Value Date   CREATININE 0.76 02/04/2016   BUN 12 02/04/2016   NA 140 02/04/2016   K 4.0 02/04/2016   CL 106 02/04/2016   CO2 24 02/04/2016    Lab Results  Component Value Date   HGBA1C 6.6 02/04/2016   Lipid Panel     Component Value Date/Time   CHOL 116 (L) 02/04/2016 1351   TRIG 124 02/04/2016 1351   HDL 59 02/04/2016 1351   CHOLHDL 2.0 02/04/2016 1351   VLDL 25 02/04/2016 1351   LDLCALC 32 02/04/2016 1351        Assessment and plan:   1. Need for prophylactic vaccination against Streptococcus pneumoniae (pneumococcus)  - pneumococcal 23 valent vaccine (PNU-IMMUNE) injection 0.5 mL; Inject 0.5 mLs into the muscle Prior to discharge for immunization.  2. Low back pain -Tramadom 50 mg, #60, one po bid prn. No refills.   Return in about 3 months (around 08/08/2016) for Diabetes, A1C.  The patient was given clear instructions to go to ER or return to medical center if symptoms don't improve, worsen or new problems develop. The patient verbalized understanding.    Henrietta HooverLinda C Bernhardt FNP  05/08/2016, 3:46 PM

## 2016-05-08 NOTE — Patient Instructions (Signed)
Continue current treatment for chronic conditions Come back in 3 months for recheck diabetes

## 2016-05-10 ENCOUNTER — Ambulatory Visit (INDEPENDENT_AMBULATORY_CARE_PROVIDER_SITE_OTHER): Payer: Medicare Other | Admitting: Podiatry

## 2016-05-10 ENCOUNTER — Encounter: Payer: Self-pay | Admitting: Podiatry

## 2016-05-10 DIAGNOSIS — M722 Plantar fascial fibromatosis: Secondary | ICD-10-CM | POA: Diagnosis not present

## 2016-05-10 MED ORDER — TRIAMCINOLONE ACETONIDE 10 MG/ML IJ SUSP
10.0000 mg | Freq: Once | INTRAMUSCULAR | Status: AC
  Administered 2016-05-10: 10 mg

## 2016-05-11 NOTE — Progress Notes (Signed)
Subjective:     Patient ID: Riley Clarke D Potenza, male   DOB: 1955/10/23, 60 y.o.   MRN: [id number]  HPI patient states my heels have started to hurt me again on both feet and the injections are so helpful forming   Review of Systems     Objective:   Physical Exam Neurovascular status intact with patient's heel doing much better with discomfort noted medial and central band that's just become intense over the last week    Assessment:     Chronic plantar fasciitis the response well to injections    Plan:     I again explained the risk of these injections and patient is comfortable with this and I reviewed consideration of surgical intervention which patient does not want due to health issues. Today I injected each fascial band 3 mg Kenalog 5 mg Xylocaine which was tolerated well

## 2016-06-07 ENCOUNTER — Telehealth: Payer: Self-pay

## 2016-06-07 NOTE — Telephone Encounter (Signed)
Refill request for tramadol. LOV 05/08/2016. Patient is aware that you will not be back until 06/19/2016. Thanks! Please advise.

## 2016-06-19 ENCOUNTER — Ambulatory Visit (INDEPENDENT_AMBULATORY_CARE_PROVIDER_SITE_OTHER): Payer: Medicare Other | Admitting: Podiatry

## 2016-06-19 ENCOUNTER — Other Ambulatory Visit: Payer: Self-pay | Admitting: Family Medicine

## 2016-06-19 ENCOUNTER — Encounter: Payer: Self-pay | Admitting: Podiatry

## 2016-06-19 DIAGNOSIS — M722 Plantar fascial fibromatosis: Secondary | ICD-10-CM | POA: Diagnosis not present

## 2016-06-19 MED ORDER — TRAMADOL HCL 50 MG PO TABS: 50.0000 mg | ORAL_TABLET | Freq: Two times a day (BID) | ORAL | 0 refills | Status: DC | PRN

## 2016-06-19 MED ORDER — TRIAMCINOLONE ACETONIDE 10 MG/ML IJ SUSP
10.0000 mg | Freq: Once | INTRAMUSCULAR | Status: AC
  Administered 2016-06-19: 10 mg

## 2016-06-21 NOTE — Progress Notes (Signed)
Subjective:     Patient ID: Riley Clarke, male   DOB: 10/31/1955, 61 y.o.   MRN: [id number]  HPI patient presents stating that the heels are really bothering him and he knows been sooner but if he can get 1 injection he feels like the last this for a long period of time   Review of Systems     Objective:   Physical Exam Neurovascular status intact with exquisite discomfort plantar aspect heel region bilateral with inflammation and fluid around the medial band    Assessment:     Acute plantar fasciitis bilateral heels    Plan:     Instructed that we can only do the injections so much but today I did go ahead and I reinjected 3 mg Kenalog 5 mill grams Xylocaine and instructed on physical therapy anti-inflammatories and will see back in approximately 4-6 months

## 2016-06-22 ENCOUNTER — Other Ambulatory Visit: Payer: Self-pay

## 2016-06-22 MED ORDER — LINAGLIPTIN 5 MG PO TABS: 5.0000 mg | ORAL_TABLET | Freq: Every day | ORAL | 3 refills | Status: DC

## 2016-07-19 ENCOUNTER — Telehealth: Payer: Self-pay

## 2016-07-24 ENCOUNTER — Other Ambulatory Visit: Payer: Self-pay | Admitting: Family Medicine

## 2016-07-24 MED ORDER — TRAMADOL HCL 50 MG PO TABS: 50.0000 mg | ORAL_TABLET | Freq: Two times a day (BID) | ORAL | 0 refills | Status: DC | PRN

## 2016-08-14 ENCOUNTER — Encounter: Payer: Self-pay | Admitting: Family Medicine

## 2016-08-14 ENCOUNTER — Ambulatory Visit (INDEPENDENT_AMBULATORY_CARE_PROVIDER_SITE_OTHER): Payer: Medicare Other | Admitting: Family Medicine

## 2016-08-14 VITALS — BP 142/77 | HR 80 | Temp 97.9°F | Resp 14 | Ht 73.0 in | Wt 229.0 lb

## 2016-08-14 DIAGNOSIS — R52 Pain, unspecified: Secondary | ICD-10-CM

## 2016-08-14 DIAGNOSIS — K219 Gastro-esophageal reflux disease without esophagitis: Secondary | ICD-10-CM | POA: Diagnosis not present

## 2016-08-14 DIAGNOSIS — E119 Type 2 diabetes mellitus without complications: Secondary | ICD-10-CM

## 2016-08-14 LAB — CBC WITH DIFFERENTIAL/PLATELET
Basophils Absolute: 64 cells/uL (ref 0–200)
Basophils Relative: 1 %
Eosinophils Absolute: 192 cells/uL (ref 15–500)
Eosinophils Relative: 3 %
HCT: 45.7 % (ref 38.5–50.0)
Hemoglobin: 15.5 g/dL (ref 13.2–17.1)
Lymphocytes Relative: 27 %
Lymphs Abs: 1728 cells/uL (ref 850–3900)
MCH: 30.3 pg (ref 27.0–33.0)
MCHC: 33.9 g/dL (ref 32.0–36.0)
MCV: 89.3 fL (ref 80.0–100.0)
MPV: 10.5 fL (ref 7.5–12.5)
Monocytes Absolute: 512 cells/uL (ref 200–950)
Monocytes Relative: 8 %
Neutro Abs: 3904 cells/uL (ref 1500–7800)
Neutrophils Relative %: 61 %
Platelets: 184 10*3/uL (ref 140–400)
RBC: 5.12 MIL/uL (ref 4.20–5.80)
RDW: 13.6 % (ref 11.0–15.0)
WBC: 6.4 10*3/uL (ref 3.8–10.8)

## 2016-08-14 LAB — COMPLETE METABOLIC PANEL WITH GFR
ALT: 22 U/L (ref 9–46)
AST: 21 U/L (ref 10–35)
Albumin: 4.1 g/dL (ref 3.6–5.1)
Alkaline Phosphatase: 94 U/L (ref 40–115)
BUN: 7 mg/dL (ref 7–25)
CO2: 23 mmol/L (ref 20–31)
Calcium: 9.5 mg/dL (ref 8.6–10.3)
Chloride: 106 mmol/L (ref 98–110)
Creat: 0.72 mg/dL (ref 0.70–1.33)
GFR, Est African American: >89 mL/min (ref >=60)
GFR, Est Non African American: >89 mL/min (ref >=60)
Glucose, Bld: 103 mg/dL — ABNORMAL HIGH (ref 65–99)
Potassium: 3.9 mmol/L (ref 3.5–5.3)
Sodium: 140 mmol/L (ref 135–146)
Total Bilirubin: 0.3 mg/dL (ref 0.2–1.2)
Total Protein: 6.5 g/dL (ref 6.1–8.1)

## 2016-08-14 LAB — LIPID PANEL
Cholesterol: 129 mg/dL (ref <200)
HDL: 60 mg/dL (ref >40)
LDL Cholesterol: 37 mg/dL (ref <100)
Total CHOL/HDL Ratio: 2.2 Ratio (ref <5.0)
Triglycerides: 160 mg/dL — ABNORMAL HIGH (ref <150)
VLDL: 32 mg/dL — ABNORMAL HIGH (ref <30)

## 2016-08-14 MED ORDER — LISINOPRIL 10 MG PO TABS: 10.0000 mg | ORAL_TABLET | Freq: Every day | ORAL | 1 refills | Status: DC

## 2016-08-14 MED ORDER — DULAGLUTIDE 1.5 MG/0.5ML ~~LOC~~ SOAJ: SUBCUTANEOUS | 2 refills | Status: DC

## 2016-08-14 MED ORDER — GABAPENTIN 300 MG PO CAPS: 300.0000 mg | ORAL_CAPSULE | Freq: Three times a day (TID) | ORAL | 3 refills | Status: DC

## 2016-08-14 MED ORDER — ESOMEPRAZOLE MAGNESIUM 40 MG PO CPDR: 40.0000 mg | DELAYED_RELEASE_CAPSULE | Freq: Every day | ORAL | 1 refills | Status: DC

## 2016-08-14 MED ORDER — METFORMIN HCL 1000 MG PO TABS: 1000.0000 mg | ORAL_TABLET | Freq: Two times a day (BID) | ORAL | 1 refills | Status: DC

## 2016-08-14 MED ORDER — LINAGLIPTIN 5 MG PO TABS: 5.0000 mg | ORAL_TABLET | Freq: Every day | ORAL | 3 refills | Status: DC

## 2016-08-14 MED ORDER — EMPAGLIFLOZIN 25 MG PO TABS: 25.0000 mg | ORAL_TABLET | Freq: Every day | ORAL | 1 refills | Status: DC

## 2016-08-15 LAB — MICROALBUMIN, URINE: Microalb, Ur: 0.8 mg/dL

## 2016-08-18 ENCOUNTER — Telehealth: Payer: Self-pay

## 2016-08-18 NOTE — Telephone Encounter (Signed)
Bonita QuinLinda, Please advise if tramadol can be refilled for patient. LOV:05/08/2016

## 2016-08-21 ENCOUNTER — Other Ambulatory Visit: Payer: Self-pay | Admitting: Family Medicine

## 2016-08-21 MED ORDER — TRAMADOL HCL 50 MG PO TABS: 50.0000 mg | ORAL_TABLET | Freq: Two times a day (BID) | ORAL | 0 refills | Status: DC | PRN

## 2016-08-23 NOTE — Patient Instructions (Signed)
Continue current medications. Watch sweets and carbs.

## 2016-08-23 NOTE — Progress Notes (Signed)
Riley Clarke, is a 61 y.o. male  RUE:[id number]  [id number]:[id number]  DOB - 1956/05/08  CC:  Chief Complaint  Patient presents with  . Diabetes    blood sugar at home has been around 175       HPI: Riley Clarke is a 61 y.o. male here for follow-up diabetes. He reports blood sugars at home have been around 175. He is currently on trulicity, jardiance, tradjenta, metformin. His dosages are in his medication list. His last A1C was 6.6 six months ago. Today is 6.2. He reports eating a low sugar diet, walks a lot, smokes 1-2 cigarettes, drinks an occassional beer.    Allergies  Allergen Reactions  . Shrimp [Shellfish Allergy]     intolerant  . Valium [Diazepam] Itching   Past Medical History:  Diagnosis Date  . Arthritis   . Diabetes mellitus without complication (HCC)   . GERD (gastroesophageal reflux disease)   . Headache(784.0)   . Hypertension    Current Outpatient Prescriptions on File Prior to Visit  Medication Sig Dispense Refill  . glucose blood (ACCU-CHEK AVIVA) test strip Use as instructed 100 each 12  . sulindac (CLINORIL) 200 MG tablet Take 200 mg by mouth 2 (two) times daily. Reported on 10/14/2015    . Vitamin D, Ergocalciferol, (DRISDOL) 50000 UNITS CAPS capsule Take 50,000 Units by mouth every 7 (seven) days. Reported on 07/05/2015     No current facility-administered medications on file prior to visit.    Family History  Problem Relation Age of Onset  . Heart disease Mother   . Diabetes Mother    Social History   Social History  . Marital status: Single    Spouse name: N/A  . Number of children: N/A  . Years of education: N/A   Occupational History  . Not on file.   Social History Main Topics  . Smoking status: Current Every Day Smoker    Packs/day: 0.25    Years: 25.00    Types: Cigarettes  . Smokeless tobacco: Never Used     Comment:     . Alcohol use 2.4 oz/week    4 Cans of beer per week     Comment: twice a week  . Drug use: No  .  Sexual activity: Not on file   Other Topics Concern  . Not on file   Social History Narrative  . No narrative on file    Review of Systems: Constitutional: Negative Skin: Negative HENT: Negative  Eyes: Negative  Neck: Negative Respiratory: Negative Cardiovascular: Negative Gastrointestinal: Negative Genitourinary: Negative  Musculoskeletal: + chronic back and knee pain Neurological: Negative for Hematological: Negative  Psychiatric/Behavioral: Negative    Objective:   Vitals:   08/14/16 1511  BP: (!) 142/77  Pulse: 80  Resp: 14  Temp: 97.9 F (36.6 C)    Physical Exam: Constitutional: Patient appears well-developed and well-nourished. No distress. HENT: Normocephalic, atraumatic, External right and left ear normal. Oropharynx is clear and moist.  Eyes: Conjunctivae and EOM are normal. PERRLA, no scleral icterus. Neck: Normal ROM. Neck supple. No lymphadenopathy, No thyromegaly. CVS: RRR, S1/S2 +, no murmurs, no gallops, no rubs Pulmonary: Effort and breath sounds normal, no stridor, rhonchi, wheezes, rales.  Abdominal: Soft. Normoactive BS,, no distension, tenderness, rebound or guarding.  Musculoskeletal: Normal range of motion. No edema and no tenderness.  Neuro: Alert.Normal muscle tone coordination. Non-focal Skin: Skin is warm and dry. No rash noted. Not diaphoretic. No erythema. No pallor. Psychiatric: Normal mood and affect.  Behavior, judgment, thought content normal.  Lab Results  Component Value Date   WBC 6.4 08/14/2016   HGB 15.5 08/14/2016   HCT 45.7 08/14/2016   MCV 89.3 08/14/2016   PLT 184 08/14/2016   Lab Results  Component Value Date   CREATININE 0.72 08/14/2016   BUN 7 08/14/2016   NA 140 08/14/2016   K 3.9 08/14/2016   CL 106 08/14/2016   CO2 23 08/14/2016    Lab Results  Component Value Date   HGBA1C 6.6 02/04/2016   Lipid Panel     Component Value Date/Time   CHOL 129 08/14/2016 1546   TRIG 160 (H) 08/14/2016 1546   HDL  60 08/14/2016 1546   CHOLHDL 2.2 08/14/2016 1546   VLDL 32 (H) 08/14/2016 1546   LDLCALC 37 08/14/2016 1546        Assessment and plan:   1. Diabetes mellitus without complication (HCC)  - Microalbumin, urine - CBC with Differential - COMPLETE METABOLIC PANEL WITH GFR - Lipid panel  2. Pain  - DG Wrist Complete Right; Future  3. Gastroesophageal reflux disease, esophagitis presence not specified  Return in about 6 months (around 02/14/2017).  The patient was given clear instructions to go to ER or return to medical center if symptoms don't improve, worsen or new problems develop. The patient verbalized understanding.    Henrietta HooverLinda C Prophet Renwick FNP  08/23/2016, 11:09 AM

## 2016-08-24 ENCOUNTER — Encounter: Payer: Self-pay | Admitting: Podiatry

## 2016-08-24 ENCOUNTER — Ambulatory Visit (INDEPENDENT_AMBULATORY_CARE_PROVIDER_SITE_OTHER): Payer: Medicare Other | Admitting: Podiatry

## 2016-08-24 DIAGNOSIS — M722 Plantar fascial fibromatosis: Secondary | ICD-10-CM

## 2016-08-24 MED ORDER — TRIAMCINOLONE ACETONIDE 10 MG/ML IJ SUSP
10.0000 mg | Freq: Once | INTRAMUSCULAR | Status: AC
  Administered 2016-08-24: 10 mg

## 2016-08-25 ENCOUNTER — Ambulatory Visit: Payer: Medicare Other | Admitting: Podiatry

## 2016-08-25 NOTE — Progress Notes (Signed)
Subjective:     Patient ID: Riley Clarke, male   DOB: 1956-04-16, 61 y.o.   MRN: [id number]  HPI patient states that the injections really helped his heels and it is not wants surgery on his feet and he understands the risk of injections but wants to do it as to the relief it gives him and the avoidance of surgery   Review of Systems     Objective:   Physical Exam Neurovascular status intact with inflammation and pain around the medial band of the heel bilateral at its insertion into the calcaneus    Assessment:     Chronic plantar fasciitis bilateral    Plan:     I discussed injections and that were trying to avoid them as much as possible but they're the only way he gets relief and he states he is willing to accept risk and ice at the shortest I can go the next time would be for months and he is scheduled for 4 months and today I did go ahead and I reinjected with 3 mg dexamethasone Kenalog 5 mg Xylocaine into each plantar heel

## 2016-08-30 ENCOUNTER — Ambulatory Visit (HOSPITAL_COMMUNITY)
Admission: RE | Admit: 2016-08-30 | Discharge: 2016-08-30 | Disposition: A | Discharge status: Home / Self Care | Payer: Medicare Other | Source: Ambulatory Visit | Attending: Family Medicine | Admitting: Family Medicine

## 2016-08-30 DIAGNOSIS — R52 Pain, unspecified: Secondary | ICD-10-CM | POA: Insufficient documentation

## 2016-08-30 DIAGNOSIS — W19XXXA Unspecified fall, initial encounter: Secondary | ICD-10-CM | POA: Insufficient documentation

## 2016-08-30 DIAGNOSIS — M25531 Pain in right wrist: Secondary | ICD-10-CM | POA: Diagnosis not present

## 2016-09-19 ENCOUNTER — Telehealth: Payer: Self-pay

## 2016-09-19 NOTE — Telephone Encounter (Signed)
Patient scheduled a appointment for this week

## 2016-09-19 NOTE — Telephone Encounter (Signed)
I see Bonita Quin prescribed this to him less than 1 month ago for wrist pain I think as her note is unclear. This was a short-term medication and if he is still experiencing pain, he will need return to the office to be evaluated. I will be happy to prescribe Meloxicam until he can come into the office to be seen.  Godfrey Pick. Tiburcio Pea, MSN, [facility] [address] [place], [place] [address] [phone]

## 2016-09-21 ENCOUNTER — Ambulatory Visit: Payer: Medicare Other | Admitting: Family Medicine

## 2016-09-22 ENCOUNTER — Other Ambulatory Visit: Payer: Self-pay | Admitting: Family Medicine

## 2016-09-22 MED ORDER — MELOXICAM 15 MG PO TABS: 15.0000 mg | ORAL_TABLET | Freq: Every day | ORAL | 1 refills | Status: DC

## 2016-09-22 NOTE — Progress Notes (Signed)
Meloxicam has ordered and e-prescribed.

## 2016-09-28 ENCOUNTER — Encounter: Payer: Self-pay | Admitting: Family Medicine

## 2016-09-28 ENCOUNTER — Ambulatory Visit (INDEPENDENT_AMBULATORY_CARE_PROVIDER_SITE_OTHER): Payer: Medicare Other | Admitting: Family Medicine

## 2016-09-28 VITALS — BP 150/96 | HR 72 | Temp 98.6°F | Resp 16 | Ht 73.0 in | Wt 230.0 lb

## 2016-09-28 DIAGNOSIS — G8928 Other chronic postprocedural pain: Secondary | ICD-10-CM

## 2016-09-28 DIAGNOSIS — M48062 Spinal stenosis, lumbar region with neurogenic claudication: Secondary | ICD-10-CM | POA: Diagnosis not present

## 2016-09-28 DIAGNOSIS — E119 Type 2 diabetes mellitus without complications: Secondary | ICD-10-CM

## 2016-09-28 LAB — POCT GLYCOSYLATED HEMOGLOBIN (HGB A1C): Hemoglobin A1C: 6.7

## 2016-09-28 MED ORDER — TRAMADOL HCL 50 MG PO TABS: 50.0000 mg | ORAL_TABLET | Freq: Three times a day (TID) | ORAL | 0 refills | Status: DC | PRN
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
## 2016-10-27 ENCOUNTER — Telehealth: Payer: Self-pay

## 2016-10-27 ENCOUNTER — Telehealth: Payer: Self-pay | Admitting: Family Medicine

## 2016-10-27 MED ORDER — TRAMADOL HCL 50 MG PO TABS: 50.0000 mg | ORAL_TABLET | Freq: Three times a day (TID) | ORAL | 0 refills | Status: DC | PRN

## 2016-10-27 NOTE — Telephone Encounter (Signed)
Referral sent to Dr. Thyra BreedMark Phillips office

## 2016-10-27 NOTE — Telephone Encounter (Signed)
Please inform patient that prescription is ready for pick-up and we are in process of scheduling him with pain management. Agreed during last visit to fill Tramadol until he could be established with pain management.   [name], MSN, FNP-C [facility]  [address] [place], [place] [address] [phone]

## 2016-10-27 NOTE — Telephone Encounter (Signed)
Patient notified script is ready for pick up

## 2016-10-27 NOTE — Telephone Encounter (Signed)
I placed a referral for patient 09/28/16 for pain management. He requested Dr. Thyra BreedMark Phillips office and [facility] Please follow-up with Dr. Chyrl CivatteMarch Phillips office and see if we can have this patient schedule for an upcoming appointment. Please review prior progress notes for diagnosis and symptoms associated with referral.

## 2016-10-27 NOTE — Telephone Encounter (Signed)
Tried to call but they close at 1pm on Friday

## 2016-11-02 NOTE — Telephone Encounter (Signed)
Left a vm with [facility] to see if they can get patient scheduled.

## 2016-11-02 NOTE — Telephone Encounter (Signed)
Sp[facility] Alycia [facility]yan [facility] and they have referral it's been reviewed by doctor to see if they would be able to help patient. Alycia RossettiRyan will updated us one he receives ut back from provider.

## 2016-11-14 ENCOUNTER — Ambulatory Visit: Payer: Medicare Other | Admitting: Family Medicine

## 2016-11-20 ENCOUNTER — Telehealth: Payer: Self-pay

## 2016-11-20 ENCOUNTER — Other Ambulatory Visit: Payer: Self-pay | Admitting: Family Medicine

## 2016-11-20 MED ORDER — TRAMADOL HCL 50 MG PO TABS: 50.0000 mg | ORAL_TABLET | Freq: Four times a day (QID) | ORAL | 0 refills | Status: DC

## 2016-11-20 NOTE — Telephone Encounter (Signed)
Riley Clarke,  Please follow-up on [name], pain management.  If [facility] will not take, please send to other pain management facilities.   [name], MSN, FNP-C [facility]  [address] [place], [place] [address] [phone]

## 2016-11-20 NOTE — Progress Notes (Signed)
Call patient to advise of tramadol prescription is ready for pick-up.   [name], MSN, FNP-C [facility]  [address] [place], [place] [address] [phone]

## 2016-11-20 NOTE — Telephone Encounter (Signed)
Patient notified that script was ready and. Referral was [facility] out to [facility]

## 2016-12-08 ENCOUNTER — Other Ambulatory Visit: Payer: Self-pay | Admitting: Family Medicine

## 2016-12-19 DIAGNOSIS — M5416 Radiculopathy, lumbar region: Secondary | ICD-10-CM | POA: Diagnosis not present

## 2016-12-19 DIAGNOSIS — M48062 Spinal stenosis, lumbar region with neurogenic claudication: Secondary | ICD-10-CM | POA: Diagnosis not present

## 2016-12-20 ENCOUNTER — Ambulatory Visit (INDEPENDENT_AMBULATORY_CARE_PROVIDER_SITE_OTHER): Payer: Medicare Other | Admitting: Podiatry

## 2016-12-20 ENCOUNTER — Other Ambulatory Visit: Payer: Self-pay | Admitting: Hematology

## 2016-12-20 DIAGNOSIS — M722 Plantar fascial fibromatosis: Secondary | ICD-10-CM

## 2016-12-20 MED ORDER — TRIAMCINOLONE ACETONIDE 10 MG/ML IJ SUSP
10.0000 mg | Freq: Once | INTRAMUSCULAR | Status: AC
  Administered 2016-12-20: 10 mg

## 2016-12-20 NOTE — Telephone Encounter (Signed)
Refill request for tramadol.  LOV 09-28-16

## 2016-12-20 NOTE — Progress Notes (Signed)
Subjective:    Patient ID: Riley Clarke, male   DOB: 61 y.o.   MRN: [id number]   HPI patient presents to his heels it just started to hurt again and the injections really help    ROS      Objective:  Physical Exam neurovascular status intact with exquisite discomfort plantar heel region bilateral     Assessment:  Plantar fasciitis bilateral with inflammation fluid around the medial band       Plan:   Injection plantar fascia bilateral 3 mg Kenalog 5 mill grams Xylocaine and instructed on physical therapy

## 2016-12-21 MED ORDER — TRAMADOL HCL 50 MG PO TABS: 50.0000 mg | ORAL_TABLET | Freq: Four times a day (QID) | ORAL | 0 refills | Status: DC

## 2016-12-21 NOTE — Telephone Encounter (Signed)
Patient had a appointment with neuro surgery and was given a injection yesterday. Patient states that he is still in a lot pain.

## 2016-12-21 NOTE — Addendum Note (Signed)
Addended by: Bing NeighborsHARRIS, Shayden Bobier S on: 12/21/2016 06:51 PM   Modules accepted: Orders

## 2016-12-21 NOTE — Telephone Encounter (Signed)
Lyla SonCarrie, could you verify if Mr. Raford PitcherBarrett has had his first visit with pain management? If so, I will not refill his tramadol. If his appointment is scheduled out, I will refill until he establishes with pain management.

## 2016-12-21 NOTE — Telephone Encounter (Signed)
Call patient to pick-up prescription for tramadol

## 2016-12-27 ENCOUNTER — Ambulatory Visit: Payer: Medicare Other | Admitting: Podiatry

## 2017-01-11 ENCOUNTER — Other Ambulatory Visit: Payer: Self-pay

## 2017-01-11 NOTE — Patient Outreach (Signed)
[facility]  01/11/2017  Alinda DoomsBryant D Clarke Sep 04, 1955 [id number]   Medication Adherence call to Mr. Beverely PaceBryant Clarke the reason for the call is because he is showing past due under [facility]. On his lisinopril 10 mg call the patient  and left a message for patient to call back also [facility] they said prescription is ready for him to pick up.    Lillia AbedAna Ollison-Moran CPhT Pharmacy Technician [facility] Direct Dial [phone]  Fax [phone] [email or web address]

## 2017-01-15 ENCOUNTER — Other Ambulatory Visit: Payer: Self-pay | Admitting: Family Medicine

## 2017-01-15 ENCOUNTER — Encounter: Payer: Self-pay | Admitting: Family Medicine

## 2017-01-15 ENCOUNTER — Ambulatory Visit (INDEPENDENT_AMBULATORY_CARE_PROVIDER_SITE_OTHER): Payer: Medicare Other | Admitting: Family Medicine

## 2017-01-15 VITALS — BP 122/78 | HR 86 | Temp 98.5°F | Resp 16 | Ht 73.0 in | Wt 221.0 lb

## 2017-01-15 DIAGNOSIS — E119 Type 2 diabetes mellitus without complications: Secondary | ICD-10-CM

## 2017-01-15 DIAGNOSIS — I10 Essential (primary) hypertension: Secondary | ICD-10-CM

## 2017-01-15 DIAGNOSIS — G894 Chronic pain syndrome: Secondary | ICD-10-CM

## 2017-01-15 LAB — CBC WITH DIFFERENTIAL/PLATELET
Basophils Absolute: 0 cells/uL (ref 0–200)
Basophils Relative: 0 %
Eosinophils Absolute: 204 cells/uL (ref 15–500)
Eosinophils Relative: 3 %
HCT: 47 % (ref 38.5–50.0)
Hemoglobin: 15.8 g/dL (ref 13.2–17.1)
Lymphocytes Relative: 31 %
Lymphs Abs: 2108 cells/uL (ref 850–3900)
MCH: 30.3 pg (ref 27.0–33.0)
MCHC: 33.6 g/dL (ref 32.0–36.0)
MCV: 90 fL (ref 80.0–100.0)
MPV: 10.4 fL (ref 7.5–12.5)
Monocytes Absolute: 476 cells/uL (ref 200–950)
Monocytes Relative: 7 %
Neutro Abs: 4012 cells/uL (ref 1500–7800)
Neutrophils Relative %: 59 %
Platelets: 192 10*3/uL (ref 140–400)
RBC: 5.22 MIL/uL (ref 4.20–5.80)
RDW: 13.6 % (ref 11.0–15.0)
WBC: 6.8 10*3/uL (ref 3.8–10.8)

## 2017-01-15 LAB — POCT GLYCOSYLATED HEMOGLOBIN (HGB A1C): Hemoglobin A1C: 6.5

## 2017-01-15 MED ORDER — TRAMADOL HCL 50 MG PO TABS: 50.0000 mg | ORAL_TABLET | Freq: Four times a day (QID) | ORAL | 0 refills | Status: DC

## 2017-01-15 NOTE — Progress Notes (Signed)
Patient ID: Riley Clarke, male    DOB: 15-Mar-1956, 61 y.o.   MRN: [id number]  PCP: Bing Neighbors, FNP  Chief Complaint  Patient presents with  . Follow-up    3 month on diabetes    Subjective:  HPI Riley Clarke is a 61 y.o. male presents for 3 month follow-up of diabetes. Medical problems include: Type 2 Diabetes Mellitus, Chronic Back Pain, and Hypertension.  Riley Clarke doesn't monitor glucose at home. He adheres to current medication regimen. 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 He chronically takes Gabapentin. Denies any associated urinary frequency, visual disturbances, episodic diaphoresis or hot flashes. Last A1C 6.7, 3 months prior. He has losed 9lbs within the last 3 months, current Body mass index is 29.16 kg/m. Riley Clarke is current smoker. He also suffers from hypertension which has remainedconsistently controlled. He denies chronic cough,  chest pain, shortness of breath, dizziness, or headaches.Riley Clarke suffers from chronic back pain resulting spinal stenosis and has an upcoming appointment with pain management mid August. He has been treated for back pain with chronic tramadol and he requests a refill of pain medication.  Social History   Social History  . Marital status: Single    Spouse name: N/A  . Number of children: N/A  . Years of education: N/A   Occupational History  . Not on file.   Social History Main Topics  . Smoking status: Current Every Day Smoker    Packs/day: 0.25    Years: 25.00    Types: Cigarettes  . Smokeless tobacco: Never Used     Comment:     . Alcohol use 2.4 oz/week    4 Cans of beer per week     Comment: twice a week  . Drug use: No  . Sexual activity: Not on file   Other Topics Concern  . Not on file   Social History Narrative  . No narrative on file    Family History  Problem Relation Age of Onset   . Heart disease Mother   . Diabetes Mother    Review of Systems See HPI Patient Active Problem List   Diagnosis Date Noted  . Diabetes (HCC) 09/12/2012  . Spinal stenosis, lumbar region, with neurogenic claudication 09/10/2012    Allergies  Allergen Reactions  . Shrimp [Shellfish Allergy]     intolerant  . Valium [Diazepam] Itching    Prior to Admission medications   Medication Sig Start Date End Date Taking? Authorizing Provider  Dulaglutide (TRULICITY) 1.5 MG/0.5ML SOPN INJECT 0.5MLS INTO THE SKIN ONCE A WEEK 08/14/16  Yes Henrietta Hoover, NP  empagliflozin (JARDIANCE) 25 MG TABS tablet Take 25 mg by mouth daily. 08/14/16  Yes Henrietta Hoover, NP  esomeprazole (NEXIUM) 40 MG capsule Take 1 capsule (40 mg total) by mouth daily before breakfast. 08/14/16  Yes Henrietta Hoover, NP  gabapentin (NEURONTIN) 300 MG capsule Take 1 capsule (300 mg total) by mouth 3 (three) times daily. 08/14/16  Yes Henrietta Hoover, NP  glucose blood (ACCU-CHEK AVIVA) test strip Use as instructed 04/05/15  Yes Henrietta Hoover, NP  linagliptin (TRADJENTA) 5 MG TABS tablet Take 1 tablet (5 mg total) by mouth daily. 08/14/16  Yes Henrietta Hoover, NP  lisinopril (PRINIVIL,ZESTRIL) 10 MG tablet Take 1 tablet (10 mg total) by mouth daily. 08/14/16  Yes Henrietta Hoover, NP  meloxicam (MOBIC) 15  MG tablet TAKE ONE (1) TABLET BY MOUTH EVERY DAY 12/08/16  Yes Massie MaroonHollis, Lachina M, FNP  metFORMIN (GLUCOPHAGE) 1000 MG tablet Take 1 tablet (1,000 mg total) by mouth 2 (two) times daily with a meal. Reported on 07/05/2015 08/14/16  Yes Henrietta HooverBernhardt, Linda C, NP  sulindac (CLINORIL) 200 MG tablet Take 200 mg by mouth 2 (two) times daily. Reported on 10/14/2015   Yes [provider]  traMADol (ULTRAM) 50 MG tablet Take 1 tablet (50 mg total) by mouth 4 (four) times daily. 12/21/16  Yes Bing NeighborsHarris, Asher Babilonia S, FNP  Vitamin D, Ergocalciferol, (DRISDOL) 50000 UNITS CAPS capsule Take 50,000 Units by mouth every 7 (seven) days.  Reported on 07/05/2015   Yes [provider]    Past Medical, Surgical Family and Social History reviewed and updated.    Objective:   Today's Vitals   01/15/17 1451  BP: 122/78  Pulse: 86  Resp: 16  Temp: 98.5 F (36.9 C)  TempSrc: Oral  SpO2: 99%  Weight: 221 lb (100.2 kg)  Height: 6\' 1"  (1.854 m)    Wt Readings from Last 3 Encounters:  01/15/17 221 lb (100.2 kg)  09/28/16 230 lb (104.3 kg)  08/14/16 229 lb (103.9 kg)   Physical Exam  Constitutional: He is oriented to person, place, and time. He appears well-developed and well-nourished.  HENT:  Head: Normocephalic and atraumatic.  Eyes: Pupils are equal, round, and reactive to light. Conjunctivae and EOM are normal.  Neck: Normal range of motion. Neck supple.  Cardiovascular: Normal rate, regular rhythm, normal heart sounds and intact distal pulses.   Pulmonary/Chest: Effort normal and breath sounds normal.  Musculoskeletal: Normal range of motion.  Neurological: He is alert and oriented to person, place, and time.  Skin: Skin is warm and dry.  Psychiatric: He has a normal mood and affect. His behavior is normal. Judgment and thought content normal.   Assessment & Plan:  1. Type 2 diabetes mellitus without complication, without long-term current use of insulin (HCC), controlld  - CBC with Differential/Platelet - COMPLETE METABOLIC PANEL WITH GFR - POCT glycosylated hemoglobin (Hb A1C)6.5 -Continue current regimen  2. Chronic pain syndrome -Continue follow-up with pain management -Tramadol 50 mg, 4 times daily as needed for pain   3. Essential hypertension, Controlled  -Continue lisinopril 5 mg daily   RTC: 6 months follow-up diabetes and hypertension   [name], MSN, FNP-C [facility]  [address] [place], [place] [address] [phone]

## 2017-01-15 NOTE — Patient Instructions (Addendum)
Your A1C is 6.5.  Goal A1C is <7.0%.  Continue to increase activity as tolerated.  There are no medication changes today.  Diabetes Mellitus and Food It is important for you to manage your blood sugar (glucose) level. Your blood glucose level can be greatly affected by what you eat. Eating healthier foods in the appropriate amounts throughout the day at about the same time each day will help you control your blood glucose level. It can also help slow or prevent worsening of your diabetes mellitus. Healthy eating may even help you improve the level of your blood pressure and reach or maintain a healthy weight. General recommendations for healthful eating and cooking habits include:  Eating meals and snacks regularly. Avoid going long periods of time without eating to lose weight.  Eating a diet that consists mainly of plant-based foods, such as fruits, vegetables, nuts, legumes, and whole grains.  Using low-heat cooking methods, such as baking, instead of high-heat cooking methods, such as deep frying.  Work with your dietitian to make sure you understand how to use the Nutrition Facts information on food labels. How can food affect me? Carbohydrates Carbohydrates affect your blood glucose level more than any other type of food. Your dietitian will help you determine how many carbohydrates to eat at each meal and teach you how to count carbohydrates. Counting carbohydrates is important to keep your blood glucose at a healthy level, especially if you are using insulin or taking certain medicines for diabetes mellitus. Alcohol Alcohol can cause sudden decreases in blood glucose (hypoglycemia), especially if you use insulin or take certain medicines for diabetes mellitus. Hypoglycemia can be a life-threatening condition. Symptoms of hypoglycemia (sleepiness, dizziness, and disorientation) are similar to symptoms of having too much alcohol. If your health care provider has given you approval to drink  alcohol, do so in moderation and use the following guidelines:  Women should not have more than one drink per day, and men should not have more than two drinks per day. One drink is equal to: ? 12 oz of beer. ? 5 oz of wine. ? 1 oz of hard liquor.  Do not drink on an empty stomach.  Keep yourself hydrated. Have water, diet soda, or unsweetened iced tea.  Regular soda, juice, and other mixers might contain a lot of carbohydrates and should be counted.  What foods are not recommended? As you make food choices, it is important to remember that all foods are not the same. Some foods have fewer nutrients per serving than other foods, even though they might have the same number of calories or carbohydrates. It is difficult to get your body what it needs when you eat foods with fewer nutrients. Examples of foods that you should avoid that are high in calories and carbohydrates but low in nutrients include:  Trans fats (most processed foods list trans fats on the Nutrition Facts label).  Regular soda.  Juice.  Candy.  Sweets, such as cake, pie, doughnuts, and cookies.  Fried foods.  What foods can I eat? Eat nutrient-rich foods, which will nourish your body and keep you healthy. The food you should eat also will depend on several factors, including:  The calories you need.  The medicines you take.  Your weight.  Your blood glucose level.  Your blood pressure level.  Your cholesterol level.  You should eat a variety of foods, including:  Protein. ? Lean cuts of meat. ? Proteins low in saturated fats, such as fish, egg  whites, and beans. Avoid processed meats.  Fruits and vegetables. ? Fruits and vegetables that may help control blood glucose levels, such as apples, mangoes, and yams.  Dairy products. ? Choose fat-free or low-fat dairy products, such as milk, yogurt, and cheese.  Grains, bread, pasta, and rice. ? Choose whole grain products, such as multigrain bread,  whole oats, and brown rice. These foods may help control blood pressure.  Fats. ? Foods containing healthful fats, such as nuts, avocado, olive oil, canola oil, and fish.  Does everyone with diabetes mellitus have the same meal plan? Because every person with diabetes mellitus is different, there is not one meal plan that works for everyone. It is very important that you meet with a dietitian who will help you create a meal plan that is just right for you. This information is not intended to replace advice given to you by your health care provider. Make sure you discuss any questions you have with your health care provider. Document Released: 02/23/2005 Document Revised: 11/04/2015 Document Reviewed: 04/25/2013 Elsevier Interactive Patient Education  2017 [employer] Inc.   Chronic Pain, Adult Chronic pain is a type of pain that lasts or keeps coming back (recurs) for at least six months. You may have chronic headaches, abdominal pain, or body pain. Chronic pain may be related to an illness, such as fibromyalgia or complex regional pain syndrome. Sometimes the cause of chronic pain is not known. Chronic pain can make it hard for you to do daily activities. If not treated, chronic pain can lead to other health problems, including anxiety and depression. Treatment depends on the cause and severity of your pain. You may need to work with a pain specialist to come up with a treatment plan. The plan may include medicine, counseling, and physical therapy. Many people benefit from a combination of two or more types of treatment to control their pain. Follow these instructions at home: Lifestyle  Consider keeping a pain diary to share with your health care providers.  Consider talking with a mental health care provider (psychologist) about how to cope with chronic pain.  Consider joining a chronic pain support group.  Try to control or lower your stress levels. Talk to your health care provider about  strategies to do this. General instructions   Take over-the-counter and prescription medicines only as told by your health care provider.  Follow your treatment plan as told by your health care provider. This may include: ? Gentle, regular exercise. ? Eating a healthy diet that includes foods such as vegetables, fruits, fish, and lean meats. ? Cognitive or behavioral therapy. ? Working with a Adult nurse. ? Meditation or yoga. ? Acupuncture or massage therapy. ? Aroma, color, light, or sound therapy. ? Local electrical stimulation. ? Shots (injections) of numbing or pain-relieving medicines into the spine or the area of pain.  Check your pain level as told by your health care provider. Ask your health care provider if you should use a pain scale.  Learn as much as you can about how to manage your chronic pain. Ask your health care provider if an intensive pain rehabilitation program or a chronic pain specialist would be helpful.  Keep all follow-up visits as told by your health care provider. This is important. Contact a health care provider if:  Your pain gets worse.  You have new pain.  You have trouble sleeping.  You have trouble doing your normal activities.  Your pain is not controlled with treatment.  Your  have side effects from pain medicine.  You feel weak. Get help right away if:  You lose feeling or have numbness in your body.  You lose control of bowel or bladder function.  Your pain suddenly gets much worse.  You develop shaking or chills.  You develop confusion.  You develop chest pain.  You have trouble breathing or shortness of breath.  You pass out.  You have thoughts about hurting yourself or others. This information is not intended to replace advice given to you by your health care provider. Make sure you discuss any questions you have with your health care provider. Document Released: 02/18/2002 Document Revised: 01/27/2016 Document  Reviewed: 11/16/2015 Elsevier Interactive Patient Education  2017 [employer]

## 2017-01-16 LAB — COMPLETE METABOLIC PANEL WITH GFR
ALT: 26 U/L (ref 9–46)
AST: 21 U/L (ref 10–35)
Albumin: 4.1 g/dL (ref 3.6–5.1)
Alkaline Phosphatase: 92 U/L (ref 40–115)
BUN: 7 mg/dL (ref 7–25)
CO2: 20 mmol/L (ref 20–32)
Calcium: 9.1 mg/dL (ref 8.6–10.3)
Chloride: 103 mmol/L (ref 98–110)
Creat: 0.86 mg/dL (ref 0.70–1.33)
GFR, Est African American: >89 mL/min (ref >=60)
GFR, Est Non African American: >89 mL/min (ref >=60)
Glucose, Bld: 132 mg/dL — ABNORMAL HIGH (ref 65–99)
Potassium: 3.9 mmol/L (ref 3.5–5.3)
Sodium: 140 mmol/L (ref 135–146)
Total Bilirubin: 0.3 mg/dL (ref 0.2–1.2)
Total Protein: 6.8 g/dL (ref 6.1–8.1)

## 2017-01-22 DIAGNOSIS — M48061 Spinal stenosis, lumbar region without neurogenic claudication: Secondary | ICD-10-CM | POA: Diagnosis not present

## 2017-01-22 DIAGNOSIS — M961 Postlaminectomy syndrome, not elsewhere classified: Secondary | ICD-10-CM | POA: Diagnosis not present

## 2017-01-22 DIAGNOSIS — M79604 Pain in right leg: Secondary | ICD-10-CM | POA: Diagnosis not present

## 2017-01-22 DIAGNOSIS — G894 Chronic pain syndrome: Secondary | ICD-10-CM | POA: Diagnosis not present

## 2017-01-22 DIAGNOSIS — Z79891 Long term (current) use of opiate analgesic: Secondary | ICD-10-CM | POA: Diagnosis not present

## 2017-01-22 DIAGNOSIS — Z79899 Other long term (current) drug therapy: Secondary | ICD-10-CM | POA: Diagnosis not present

## 2017-01-23 ENCOUNTER — Other Ambulatory Visit: Payer: Self-pay | Admitting: Pain Medicine

## 2017-01-23 ENCOUNTER — Other Ambulatory Visit: Payer: Self-pay

## 2017-01-23 DIAGNOSIS — M545 Low back pain: Secondary | ICD-10-CM

## 2017-01-23 MED ORDER — DULAGLUTIDE 1.5 MG/0.5ML ~~LOC~~ SOAJ: SUBCUTANEOUS | 2 refills | Status: DC

## 2017-01-31 DIAGNOSIS — M545 Low back pain: Secondary | ICD-10-CM | POA: Diagnosis not present

## 2017-01-31 DIAGNOSIS — M961 Postlaminectomy syndrome, not elsewhere classified: Secondary | ICD-10-CM | POA: Diagnosis not present

## 2017-01-31 DIAGNOSIS — M48061 Spinal stenosis, lumbar region without neurogenic claudication: Secondary | ICD-10-CM | POA: Diagnosis not present

## 2017-02-01 ENCOUNTER — Ambulatory Visit
Admission: RE | Admit: 2017-02-01 | Discharge: 2017-02-01 | Disposition: A | Discharge status: Home / Self Care | Payer: Medicare Other | Source: Ambulatory Visit | Attending: Pain Medicine | Admitting: Pain Medicine

## 2017-02-01 DIAGNOSIS — M48061 Spinal stenosis, lumbar region without neurogenic claudication: Secondary | ICD-10-CM | POA: Diagnosis not present

## 2017-02-01 DIAGNOSIS — M545 Low back pain: Secondary | ICD-10-CM

## 2017-02-06 DIAGNOSIS — M545 Low back pain: Secondary | ICD-10-CM | POA: Diagnosis not present

## 2017-02-06 DIAGNOSIS — M79609 Pain in unspecified limb: Secondary | ICD-10-CM | POA: Diagnosis not present

## 2017-02-09 ENCOUNTER — Other Ambulatory Visit: Payer: Self-pay

## 2017-02-09 MED ORDER — GABAPENTIN 300 MG PO CAPS: 300.0000 mg | ORAL_CAPSULE | Freq: Three times a day (TID) | ORAL | 3 refills | Status: DC

## 2017-02-09 NOTE — Telephone Encounter (Signed)
Refill for gabapentin sent into pharmacy.

## 2017-02-13 DIAGNOSIS — Z79899 Other long term (current) drug therapy: Secondary | ICD-10-CM | POA: Diagnosis not present

## 2017-02-13 DIAGNOSIS — Z79891 Long term (current) use of opiate analgesic: Secondary | ICD-10-CM | POA: Diagnosis not present

## 2017-02-13 DIAGNOSIS — M792 Neuralgia and neuritis, unspecified: Secondary | ICD-10-CM | POA: Diagnosis not present

## 2017-02-13 DIAGNOSIS — G894 Chronic pain syndrome: Secondary | ICD-10-CM | POA: Diagnosis not present

## 2017-02-15 ENCOUNTER — Telehealth: Payer: Self-pay | Admitting: Family Medicine

## 2017-02-15 DIAGNOSIS — H547 Unspecified visual loss: Secondary | ICD-10-CM

## 2017-02-15 DIAGNOSIS — Z1211 Encounter for screening for malignant neoplasm of colon: Secondary | ICD-10-CM

## 2017-02-15 NOTE — Telephone Encounter (Signed)
Patient needs referral placed to [facility] for a colonoscopy. Patient also needs referral placed to eye doctor on  [address] Please advise

## 2017-02-16 NOTE — Telephone Encounter (Signed)
Referrals sent for colonoscopy and opthalmology

## 2017-02-19 ENCOUNTER — Ambulatory Visit (INDEPENDENT_AMBULATORY_CARE_PROVIDER_SITE_OTHER): Payer: Medicare Other

## 2017-02-19 DIAGNOSIS — Z23 Encounter for immunization: Secondary | ICD-10-CM | POA: Diagnosis not present

## 2017-02-20 ENCOUNTER — Other Ambulatory Visit: Payer: Self-pay

## 2017-02-20 MED ORDER — EMPAGLIFLOZIN 25 MG PO TABS: 25.0000 mg | ORAL_TABLET | Freq: Every day | ORAL | 1 refills | Status: DC

## 2017-02-20 NOTE — Telephone Encounter (Signed)
Refill for jardiance sent into pharmacy. Thanks!

## 2017-02-28 ENCOUNTER — Other Ambulatory Visit: Payer: Self-pay

## 2017-02-28 DIAGNOSIS — K219 Gastro-esophageal reflux disease without esophagitis: Secondary | ICD-10-CM

## 2017-02-28 MED ORDER — ESOMEPRAZOLE MAGNESIUM 40 MG PO CPDR: 40.0000 mg | DELAYED_RELEASE_CAPSULE | Freq: Every day | ORAL | 1 refills | Status: DC

## 2017-03-01 ENCOUNTER — Encounter: Payer: Self-pay | Admitting: Podiatry

## 2017-03-01 ENCOUNTER — Ambulatory Visit (INDEPENDENT_AMBULATORY_CARE_PROVIDER_SITE_OTHER): Payer: Medicare Other | Admitting: Podiatry

## 2017-03-01 DIAGNOSIS — M722 Plantar fascial fibromatosis: Secondary | ICD-10-CM | POA: Diagnosis not present

## 2017-03-01 MED ORDER — TRIAMCINOLONE ACETONIDE 10 MG/ML IJ SUSP
10.0000 mg | Freq: Once | INTRAMUSCULAR | Status: AC
  Administered 2017-03-01: 10 mg

## 2017-03-01 NOTE — Progress Notes (Signed)
Subjective:    Patient ID: Riley Clarke, male   DOB: 61 y.o.   MRN: [id number]   HPI patient presents stating my heels are starting hurting but the left being worse and I need something to help me after I been sitting or when I get up in the morning    ROS      Objective:  Physical Exam neurovascular status intact with patient found to have inflammation of the left plantar fascia at the insertional point tendon calcaneus localized in nature with moderate depression of the arch     Assessment:    Acute plantar fasciitis bilateral     Plan:  Injected the plantar fascia bilateral 3 mg Kenalog 5 mill grams Xylocaine advised on physical therapy anti-inflammatories and dispensed night splint in order to support the plantar foot and gave instructions for ice therapy

## 2017-03-05 DIAGNOSIS — M79604 Pain in right leg: Secondary | ICD-10-CM | POA: Diagnosis not present

## 2017-03-05 DIAGNOSIS — M961 Postlaminectomy syndrome, not elsewhere classified: Secondary | ICD-10-CM | POA: Diagnosis not present

## 2017-03-05 DIAGNOSIS — M48061 Spinal stenosis, lumbar region without neurogenic claudication: Secondary | ICD-10-CM | POA: Diagnosis not present

## 2017-03-05 DIAGNOSIS — G894 Chronic pain syndrome: Secondary | ICD-10-CM | POA: Diagnosis not present

## 2017-03-16 DIAGNOSIS — H2513 Age-related nuclear cataract, bilateral: Secondary | ICD-10-CM | POA: Diagnosis not present

## 2017-03-16 DIAGNOSIS — M48062 Spinal stenosis, lumbar region with neurogenic claudication: Secondary | ICD-10-CM | POA: Diagnosis not present

## 2017-03-16 DIAGNOSIS — H35033 Hypertensive retinopathy, bilateral: Secondary | ICD-10-CM | POA: Diagnosis not present

## 2017-03-16 DIAGNOSIS — H25013 Cortical age-related cataract, bilateral: Secondary | ICD-10-CM | POA: Diagnosis not present

## 2017-03-16 DIAGNOSIS — E119 Type 2 diabetes mellitus without complications: Secondary | ICD-10-CM | POA: Diagnosis not present

## 2017-03-21 DIAGNOSIS — M961 Postlaminectomy syndrome, not elsewhere classified: Secondary | ICD-10-CM | POA: Diagnosis not present

## 2017-03-21 DIAGNOSIS — M79604 Pain in right leg: Secondary | ICD-10-CM | POA: Diagnosis not present

## 2017-03-21 DIAGNOSIS — M48061 Spinal stenosis, lumbar region without neurogenic claudication: Secondary | ICD-10-CM | POA: Diagnosis not present

## 2017-03-21 DIAGNOSIS — G894 Chronic pain syndrome: Secondary | ICD-10-CM | POA: Diagnosis not present

## 2017-03-27 ENCOUNTER — Other Ambulatory Visit: Payer: Self-pay

## 2017-03-27 MED ORDER — LINAGLIPTIN 5 MG PO TABS: 5.0000 mg | ORAL_TABLET | Freq: Every day | ORAL | 3 refills | Status: DC

## 2017-04-03 ENCOUNTER — Other Ambulatory Visit: Payer: Self-pay | Admitting: Family Medicine

## 2017-04-05 ENCOUNTER — Other Ambulatory Visit: Payer: Self-pay

## 2017-04-05 MED ORDER — METFORMIN HCL 1000 MG PO TABS: 1000.0000 mg | ORAL_TABLET | Freq: Two times a day (BID) | ORAL | 1 refills | Status: DC

## 2017-04-05 MED ORDER — LISINOPRIL 10 MG PO TABS: 10.0000 mg | ORAL_TABLET | Freq: Every day | ORAL | 1 refills | Status: DC

## 2017-04-18 DIAGNOSIS — G894 Chronic pain syndrome: Secondary | ICD-10-CM | POA: Diagnosis not present

## 2017-04-18 DIAGNOSIS — M79604 Pain in right leg: Secondary | ICD-10-CM | POA: Diagnosis not present

## 2017-04-18 DIAGNOSIS — M961 Postlaminectomy syndrome, not elsewhere classified: Secondary | ICD-10-CM | POA: Diagnosis not present

## 2017-04-18 DIAGNOSIS — M48061 Spinal stenosis, lumbar region without neurogenic claudication: Secondary | ICD-10-CM | POA: Diagnosis not present

## 2017-04-25 ENCOUNTER — Ambulatory Visit: Payer: Medicare Other | Admitting: Podiatry

## 2017-04-30 DIAGNOSIS — M961 Postlaminectomy syndrome, not elsewhere classified: Secondary | ICD-10-CM | POA: Diagnosis not present

## 2017-04-30 DIAGNOSIS — M48061 Spinal stenosis, lumbar region without neurogenic claudication: Secondary | ICD-10-CM | POA: Diagnosis not present

## 2017-04-30 DIAGNOSIS — Z87898 Personal history of other specified conditions: Secondary | ICD-10-CM | POA: Diagnosis not present

## 2017-05-16 ENCOUNTER — Other Ambulatory Visit: Payer: Self-pay | Admitting: Family Medicine

## 2017-05-16 DIAGNOSIS — G894 Chronic pain syndrome: Secondary | ICD-10-CM | POA: Diagnosis not present

## 2017-05-16 DIAGNOSIS — M961 Postlaminectomy syndrome, not elsewhere classified: Secondary | ICD-10-CM | POA: Diagnosis not present

## 2017-05-16 DIAGNOSIS — Z79891 Long term (current) use of opiate analgesic: Secondary | ICD-10-CM | POA: Diagnosis not present

## 2017-05-16 DIAGNOSIS — M545 Low back pain: Secondary | ICD-10-CM | POA: Diagnosis not present

## 2017-05-16 DIAGNOSIS — Z79899 Other long term (current) drug therapy: Secondary | ICD-10-CM | POA: Diagnosis not present

## 2017-05-16 DIAGNOSIS — M79604 Pain in right leg: Secondary | ICD-10-CM | POA: Diagnosis not present

## 2017-06-15 DIAGNOSIS — M961 Postlaminectomy syndrome, not elsewhere classified: Secondary | ICD-10-CM | POA: Diagnosis not present

## 2017-06-15 DIAGNOSIS — M545 Low back pain: Secondary | ICD-10-CM | POA: Diagnosis not present

## 2017-06-15 DIAGNOSIS — M48061 Spinal stenosis, lumbar region without neurogenic claudication: Secondary | ICD-10-CM | POA: Diagnosis not present

## 2017-06-15 DIAGNOSIS — G894 Chronic pain syndrome: Secondary | ICD-10-CM | POA: Diagnosis not present

## 2017-06-18 ENCOUNTER — Other Ambulatory Visit: Payer: Self-pay | Admitting: Family Medicine

## 2017-06-18 DIAGNOSIS — M79604 Pain in right leg: Secondary | ICD-10-CM | POA: Diagnosis not present

## 2017-06-18 DIAGNOSIS — G894 Chronic pain syndrome: Secondary | ICD-10-CM | POA: Diagnosis not present

## 2017-06-18 DIAGNOSIS — M545 Low back pain: Secondary | ICD-10-CM | POA: Diagnosis not present

## 2017-06-18 DIAGNOSIS — Z79899 Other long term (current) drug therapy: Secondary | ICD-10-CM | POA: Diagnosis not present

## 2017-06-18 DIAGNOSIS — Z79891 Long term (current) use of opiate analgesic: Secondary | ICD-10-CM | POA: Diagnosis not present

## 2017-06-18 DIAGNOSIS — M961 Postlaminectomy syndrome, not elsewhere classified: Secondary | ICD-10-CM | POA: Diagnosis not present

## 2017-06-25 DIAGNOSIS — M545 Low back pain: Secondary | ICD-10-CM | POA: Diagnosis not present

## 2017-06-25 DIAGNOSIS — G894 Chronic pain syndrome: Secondary | ICD-10-CM | POA: Diagnosis not present

## 2017-06-25 DIAGNOSIS — M48061 Spinal stenosis, lumbar region without neurogenic claudication: Secondary | ICD-10-CM | POA: Diagnosis not present

## 2017-06-25 DIAGNOSIS — M961 Postlaminectomy syndrome, not elsewhere classified: Secondary | ICD-10-CM | POA: Diagnosis not present

## 2017-06-28 ENCOUNTER — Ambulatory Visit: Payer: Medicare Other | Admitting: Podiatry

## 2017-07-04 ENCOUNTER — Ambulatory Visit (INDEPENDENT_AMBULATORY_CARE_PROVIDER_SITE_OTHER): Payer: Medicare Other

## 2017-07-04 ENCOUNTER — Ambulatory Visit (INDEPENDENT_AMBULATORY_CARE_PROVIDER_SITE_OTHER): Payer: Medicare Other | Admitting: Podiatry

## 2017-07-04 ENCOUNTER — Other Ambulatory Visit: Payer: Self-pay | Admitting: Podiatry

## 2017-07-04 ENCOUNTER — Encounter: Payer: Self-pay | Admitting: Podiatry

## 2017-07-04 DIAGNOSIS — M7752 Other enthesopathy of left foot: Secondary | ICD-10-CM

## 2017-07-04 DIAGNOSIS — M79671 Pain in right foot: Secondary | ICD-10-CM

## 2017-07-04 DIAGNOSIS — M722 Plantar fascial fibromatosis: Secondary | ICD-10-CM | POA: Diagnosis not present

## 2017-07-04 DIAGNOSIS — M79672 Pain in left foot: Principal | ICD-10-CM

## 2017-07-04 DIAGNOSIS — M7750 Other enthesopathy of unspecified foot: Secondary | ICD-10-CM | POA: Diagnosis not present

## 2017-07-04 DIAGNOSIS — M7751 Other enthesopathy of right foot: Secondary | ICD-10-CM

## 2017-07-04 MED ORDER — TRIAMCINOLONE ACETONIDE 10 MG/ML IJ SUSP
10.0000 mg | Freq: Once | INTRAMUSCULAR | Status: AC
  Administered 2017-07-04: 10 mg

## 2017-07-05 NOTE — Progress Notes (Signed)
Subjective:   Patient ID: Riley Clarke D Sciandra, male   DOB: 62 y.o.   MRN: [id number]   HPI Patient presents with exquisite discomfort plantar aspect heel region bilateral.  States had 4 months of relief   ROS      Objective:  Physical Exam  Patient has just started in the heel region bilateral     Assessment:  Chronic fasciitis that we try to keep under control     Plan:  We injected the plantar fascial bilateral 3 mg Kenalog 5 mg Xylocaine and instructed on night splint supportive shoe gear usage

## 2017-07-11 ENCOUNTER — Other Ambulatory Visit: Payer: Self-pay | Admitting: Family Medicine

## 2017-07-12 DIAGNOSIS — Z79899 Other long term (current) drug therapy: Secondary | ICD-10-CM | POA: Diagnosis not present

## 2017-07-12 DIAGNOSIS — G894 Chronic pain syndrome: Secondary | ICD-10-CM | POA: Diagnosis not present

## 2017-07-12 DIAGNOSIS — Z79891 Long term (current) use of opiate analgesic: Secondary | ICD-10-CM | POA: Diagnosis not present

## 2017-07-12 DIAGNOSIS — M961 Postlaminectomy syndrome, not elsewhere classified: Secondary | ICD-10-CM | POA: Diagnosis not present

## 2017-07-12 DIAGNOSIS — M545 Low back pain: Secondary | ICD-10-CM | POA: Diagnosis not present

## 2017-07-12 DIAGNOSIS — M79604 Pain in right leg: Secondary | ICD-10-CM | POA: Diagnosis not present

## 2017-07-18 ENCOUNTER — Ambulatory Visit: Payer: Medicare Other | Admitting: Family Medicine

## 2017-07-25 ENCOUNTER — Ambulatory Visit (INDEPENDENT_AMBULATORY_CARE_PROVIDER_SITE_OTHER): Payer: Medicare Other | Admitting: Family Medicine

## 2017-07-25 ENCOUNTER — Encounter: Payer: Self-pay | Admitting: Family Medicine

## 2017-07-25 VITALS — BP 138/74 | HR 80 | Temp 98.7°F | Resp 18 | Ht 73.0 in | Wt 233.0 lb

## 2017-07-25 DIAGNOSIS — E786 Lipoprotein deficiency: Secondary | ICD-10-CM | POA: Diagnosis not present

## 2017-07-25 DIAGNOSIS — I10 Essential (primary) hypertension: Secondary | ICD-10-CM

## 2017-07-25 DIAGNOSIS — E119 Type 2 diabetes mellitus without complications: Secondary | ICD-10-CM | POA: Diagnosis not present

## 2017-07-25 LAB — POCT URINALYSIS DIP (DEVICE)
Bilirubin Urine: NEGATIVE
Glucose, UA: 500 mg/dL — AB
Ketones, ur: NEGATIVE mg/dL
Leukocytes, UA: NEGATIVE
Nitrite: NEGATIVE
Protein, ur: NEGATIVE mg/dL
Specific Gravity, Urine: 1.015 (ref 1.005–1.030)
Urobilinogen, UA: 1 mg/dL (ref 0.0–1.0)
pH: 7 (ref 5.0–8.0)

## 2017-07-25 LAB — POCT GLYCOSYLATED HEMOGLOBIN (HGB A1C): Hemoglobin A1C: 7.2

## 2017-07-25 NOTE — Patient Instructions (Addendum)
Continue to engage in physical activity as tolerated. Notify the office when medication refills are needed.   I am referring you for a diabetic eye exam.

## 2017-07-25 NOTE — Progress Notes (Signed)
Patient ID: Riley Clarke, male    DOB: 07-15-1955, 62 y.o.   MRN: [id number]  PCP: Bing Neighbors, FNP  Chief Complaint  Patient presents with  . Follow-up    6 month on diabetes and HTN    Subjective:  HPI Riley Clarke is a 62 y.o. male with type 2 diabetes, chronic pain, hypertension presents for 63-month follow-up of chronic conditions.  Riley Clarke does not routinely monitor his blood sugar at home.  His last A1c 6.5. He continues to remain inactive a routine physical activity due to chronic pain mostly localized to his back He has gained weight since his last follow-up.  Current Body mass index is 30.74 kg/m. Reports no routine monitoring of his blood pressure.  Although he denies any associated dizziness, headaches, chest pain, new weakness, or shortness of breath.  He continues to smoke although reports efforts to reduce the number of cigarettes he smokes per day.  He reports no other complaints today. Social History   Socioeconomic History  . Marital status: Single    Spouse name: Not on file  . Number of children: Not on file  . Years of education: Not on file  . Highest education level: Not on file  Social Needs  . Financial resource strain: Not on file  . Food insecurity - worry: Not on file  . Food insecurity - inability: Not on file  . Transportation needs - medical: Not on file  . Transportation needs - non-medical: Not on file  Occupational History  . Not on file  Tobacco Use  . Smoking status: Current Every Day Smoker    Packs/day: 0.25    Years: 25.00    Pack years: 6.25    Types: Cigarettes  . Smokeless tobacco: Never Used  . Tobacco comment:     Substance and Sexual Activity  . Alcohol use: Yes    Alcohol/week: 2.4 oz    Types: 4 Cans of beer per week    Comment: twice a week  . Drug use: No  . Sexual activity: Not on file  Other Topics Concern  . Not on file  Social History Narrative  . Not on file    Family History  Problem Relation Age  of Onset  . Heart disease Mother   . Diabetes Mother      Review of Systems Constitutional: Negative for fever, chills, diaphoresis, activity change, appetite change and fatigue. HENT: Negative for ear pain, nosebleeds, congestion, facial swelling, rhinorrhea, neck pain, neck stiffness and ear discharge.  Eyes: Negative for pain, discharge, redness, itching and visual disturbance. Respiratory: Negative for cough, choking, chest tightness, shortness of breath, wheezing and stridor.  Cardiovascular: Negative for chest pain, palpitations and leg swelling. Gastrointestinal: Negative for abdominal distention. Genitourinary: Negative for dysuria, urgency, frequency, hematuria, flank pain, decreased urine volume, difficulty urinating and dyspareunia.  Musculoskeletal: Negative for back pain, joint swelling, arthralgia and gait problem. Neurological: Negative for dizziness, tremors, seizures, syncope, facial asymmetry, speech difficulty, weakness, light-headedness, numbness and headaches.  Hematological: Negative for adenopathy. Does not bruise/bleed easily. Psychiatric/Behavioral: Negative for hallucinations, behavioral problems, confusion, dysphoric mood, decreased concentration and agitation.   Patient Active Problem List   Diagnosis Date Noted  . Diabetes (HCC) 09/12/2012  . Spinal stenosis, lumbar region, with neurogenic claudication 09/10/2012    Allergies  Allergen Reactions  . Shrimp [Shellfish Allergy]     intolerant  . Valium [Diazepam] Itching    Prior to Admission medications   Medication Sig Start Date  End Date Taking? Authorizing Provider  empagliflozin (JARDIANCE) 25 MG TABS tablet Take 25 mg by mouth daily. 02/20/17  Yes Massie MaroonHollis, Lachina M, FNP  esomeprazole (NEXIUM) 40 MG capsule Take 1 capsule (40 mg total) by mouth daily before breakfast. 02/28/17  Yes Bing NeighborsHarris, Shritha Bresee S, FNP  gabapentin (NEURONTIN) 300 MG capsule TAKE 1 CAPSULE BY MOUTH THREE TIMES A DAY 07/11/17  Yes  Bing NeighborsHarris, Yamilette Garretson S, FNP  glucose blood (ACCU-CHEK AVIVA) test strip Use as instructed 04/05/15  Yes Henrietta HooverBernhardt, Linda C, NP  HYDROcodone-acetaminophen [facility](NORCO) 10-325 MG tablet  02/23/17  Yes [provider]  linagliptin (TRADJENTA) 5 MG TABS tablet Take 1 tablet (5 mg total) by mouth daily. 03/27/17  Yes Bing NeighborsHarris, Patton Swisher S, FNP  lisinopril (PRINIVIL,ZESTRIL) 10 MG tablet Take 1 tablet (10 mg total) by mouth daily. 04/05/17  Yes Bing NeighborsHarris, Ariadne Rissmiller S, FNP  meloxicam (MOBIC) 15 MG tablet TAKE ONE (1) TABLET BY MOUTH EVERY DAY 07/11/17  Yes Bing NeighborsHarris, Fotios Amos S, FNP  metFORMIN (GLUCOPHAGE) 1000 MG tablet Take 1 tablet (1,000 mg total) by mouth 2 (two) times daily with a meal. Reported on 07/05/2015 04/05/17  Yes Bing NeighborsHarris, Mykah Shin S, FNP  sulindac (CLINORIL) 200 MG tablet Take 200 mg by mouth 2 (two) times daily. Reported on 10/14/2015   Yes [provider]  TRULICITY 1.5 MG/0.5ML SOPN INJECT 0.5 MILLILITERS INTO THE SKIN ONCE A WEEK 04/03/17  Yes Bing NeighborsHarris, Toria Monte S, FNP  Vitamin D, Ergocalciferol, (DRISDOL) 50000 UNITS CAPS capsule Take 50,000 Units by mouth every 7 (seven) days. Reported on 07/05/2015   Yes [provider]  traMADol (ULTRAM) 50 MG tablet Take 1 tablet (50 mg total) by mouth 4 (four) times daily. Patient not taking: Reported on 07/25/2017 01/15/17   Bing NeighborsHarris, Ronnie Doo S, FNP    Past Medical, Surgical Family and Social History reviewed and updated.    Objective:   Today's Vitals   07/25/17 1508  BP: 138/74  Pulse: 80  Resp: 18  Temp: 98.7 F (37.1 C)  TempSrc: Oral  SpO2: 98%  Weight: 233 lb (105.7 kg)  Height: 6\' 1"  (1.854 m)    Wt Readings from Last 3 Encounters:  07/25/17 233 lb (105.7 kg)  01/15/17 221 lb (100.2 kg)  09/28/16 230 lb (104.3 kg)    Physical Exam Constitutional: Patient appears well-developed and well-nourished. No distress. HENT: Normocephalic, atraumatic, External right and left ear normal. Oropharynx is clear and moist.  Eyes:  Conjunctivae and EOM are normal. PERRLA, no scleral icterus. Neck: Normal ROM. Neck supple. No JVD. No tracheal deviation. No thyromegaly. CVS: RRR, S1/S2 +, no murmurs, no gallops, no carotid bruit.  Pulmonary: Effort and breath sounds normal, no stridor, rhonchi, wheezes, rales.  Abdominal: Soft. BS +, no distension, tenderness, rebound or guarding.  Musculoskeletal: Normal range of motion. No edema and no tenderness.  Lymphadenopathy: No lymphadenopathy noted, cervical, inguinal or axillary Neuro: Alert. Normal reflexes, muscle tone coordination.  Skin: Skin is warm and dry. No rash noted. Not diaphoretic. No erythema. No pallor. Psychiatric: Normal mood and affect. Behavior, judgment, thought content normal.   Assessment & Plan:  1. Type 2 diabetes mellitus without complication, unspecified whether long term insulin use (HCC), 7.2, slightly increased since last follow-up.  Encourage dietary modifications with an 1800 consistent carb diet plan.  Patient provided a paper copy here in office today.  No changes to medication therapy today.  Goal A1c less than 7 at next follow-up.   2. Hypertension, unspecified type, well controlled today.  No changes  We have discussed target BP range and blood pressure goal. I have advised patient to check BP regularly and to call us back or report to clinic if the numbers are consistently higher than 140/90. We discussed the importance of compliance with medical therapy and DASH diet recommended, consequences of uncontrolled hypertension discussed.    3. Hypocholesteremia, checking a fasting lipid panel today. Encouraged patient to adhere to a DASH diet rich in saturated animal fat.    Orders Placed This Encounter  Procedures  . Lipid panel    Order Specific Question:   Has the patient fasted?    Answer:   No  . CBC with Differential  . Comprehensive metabolic panel    Order Specific Question:   Has the patient fasted?    Answer:   No  .  Microalbumin/Creatinine Ratio, Urine  . Ambulatory referral to Ophthalmology    Referral Priority:   Routine    Referral Type:   Consultation    Referral Reason:   Specialty Services Required    Requested Specialty:   Ophthalmology    Number of Visits Requested:   1  . POCT glycosylated hemoglobin (Hb A1C)  . POCT urinalysis dip (device)     Godfrey Pick. Tiburcio Pea, MSN, FNP-C [facility]  [address] [place] Mar[address] [place], [place] [address] [phone]

## 2017-07-26 LAB — COMPREHENSIVE METABOLIC PANEL
ALT: 24 IU/L (ref 0–44)
AST: 19 IU/L (ref 0–40)
Albumin/Globulin Ratio: 1.7 (ref 1.2–2.2)
Albumin: 4.1 g/dL (ref 3.5–5.5)
Alkaline Phosphatase: 100 IU/L (ref 39–117)
BUN/Creatinine Ratio: 11 (ref 9–20)
BUN: 8 mg/dL (ref 6–24)
Bilirubin Total: 0.3 mg/dL (ref 0.0–1.2)
CO2: 23 mmol/L (ref 20–29)
Calcium: 9 mg/dL (ref 8.7–10.2)
Chloride: 102 mmol/L (ref 96–106)
Creatinine, Ser: 0.76 mg/dL (ref 0.76–1.27)
GFR calc Af Amer: 115 mL/min/{1.73_m2} (ref >59)
GFR calc non Af Amer: 100 mL/min/{1.73_m2} (ref >59)
Globulin, Total: 2.4 g/dL (ref 1.5–4.5)
Glucose: 137 mg/dL — ABNORMAL HIGH (ref 65–99)
Potassium: 4.2 mmol/L (ref 3.5–5.2)
Sodium: 140 mmol/L (ref 134–144)
Total Protein: 6.5 g/dL (ref 6.0–8.5)

## 2017-07-26 LAB — CBC WITH DIFFERENTIAL/PLATELET
Basophils Absolute: 0 10*3/uL (ref 0.0–0.2)
Basos: 0 %
EOS (ABSOLUTE): 0.2 10*3/uL (ref 0.0–0.4)
Eos: 2 %
Hematocrit: 43.8 % (ref 37.5–51.0)
Hemoglobin: 14.7 g/dL (ref 13.0–17.7)
Immature Grans (Abs): 0.1 10*3/uL (ref 0.0–0.1)
Immature Granulocytes: 1 %
Lymphocytes Absolute: 1.4 10*3/uL (ref 0.7–3.1)
Lymphs: 19 %
MCH: 30.2 pg (ref 26.6–33.0)
MCHC: 33.6 g/dL (ref 31.5–35.7)
MCV: 90 fL (ref 79–97)
Monocytes Absolute: 0.8 10*3/uL (ref 0.1–0.9)
Monocytes: 10 %
Neutrophils Absolute: 5.1 10*3/uL (ref 1.4–7.0)
Neutrophils: 68 %
Platelets: 153 10*3/uL (ref 150–379)
RBC: 4.86 x10E6/uL (ref 4.14–5.80)
RDW: 13.8 % (ref 12.3–15.4)
WBC: 7.5 10*3/uL (ref 3.4–10.8)

## 2017-07-26 LAB — MICROALBUMIN / CREATININE URINE RATIO
Creatinine, Urine: 119.7 mg/dL
Microalb/Creat Ratio: 13 mg/g creat (ref 0.0–30.0)
Microalbumin, Urine: 15.6 ug/mL

## 2017-07-26 LAB — LIPID PANEL
Chol/HDL Ratio: 2 ratio (ref 0.0–5.0)
Cholesterol, Total: 135 mg/dL (ref 100–199)
HDL: 68 mg/dL (ref >39)
LDL Calculated: 39 mg/dL (ref 0–99)
Triglycerides: 139 mg/dL (ref 0–149)
VLDL Cholesterol Cal: 28 mg/dL (ref 5–40)

## 2017-08-01 ENCOUNTER — Other Ambulatory Visit: Payer: Self-pay | Admitting: Family Medicine

## 2017-08-10 ENCOUNTER — Other Ambulatory Visit: Payer: Self-pay | Admitting: Family Medicine

## 2017-08-14 NOTE — Progress Notes (Deleted)
[facility] - Clinic Note  08/15/2017     CHIEF COMPLAINT Patient presents for No chief complaint on file.   HISTORY OF PRESENT ILLNESS: Riley Clarke is a 62 y.o. male who presents to the clinic today for:     Referring physician: Bing Neighbors, FNP [id number] [place], [place] [address]  HISTORICAL INFORMATION:   Selected notes from the MEDICAL RECORD NUMBER Referred by K. Tiburcio Pea, FNP for DM exam;  LEE-  Ocular Hx-  PMH- DM, HTN, hypocholesteremia    CURRENT MEDICATIONS: No current outpatient medications on file. (Ophthalmic Drugs)   No current facility-administered medications for this visit.  (Ophthalmic Drugs)   Current Outpatient Medications (Other)  Medication Sig   empagliflozin (JARDIANCE) 25 MG TABS tablet Take 25 mg by mouth daily.   esomeprazole (NEXIUM) 40 MG capsule Take 1 capsule (40 mg total) by mouth daily before breakfast.   gabapentin (NEURONTIN) 300 MG capsule TAKE 1 CAPSULE BY MOUTH THREE TIMES A DAY   glucose blood (ACCU-CHEK AVIVA) test strip Use as instructed   HYDROcodone-acetaminophen (NORCO) 10-325 MG tablet    lisinopril (PRINIVIL,ZESTRIL) 10 MG tablet Take 1 tablet (10 mg total) by mouth daily.   meloxicam (MOBIC) 15 MG tablet TAKE ONE (1) TABLET BY MOUTH EVERY DAY   metFORMIN (GLUCOPHAGE) 1000 MG tablet Take 1 tablet (1,000 mg total) by mouth 2 (two) times daily with a meal. Reported on 07/05/2015   sulindac (CLINORIL) 200 MG tablet Take 200 mg by mouth 2 (two) times daily. Reported on 10/14/2015   TRADJENTA 5 MG TABS tablet TAKE ONE (1) TABLET BY MOUTH EVERY DAY   traMADol (ULTRAM) 50 MG tablet Take 1 tablet (50 mg total) by mouth 4 (four) times daily. (Patient not taking: Reported on 07/25/2017)   TRULICITY 1.5 MG/0.5ML SOPN INJECT 0.5 MILLILITERS INTO THE SKIN ONCE A WEEK   Vitamin D, Ergocalciferol, (DRISDOL) 50000 UNITS CAPS capsule Take 50,000 Units by mouth every 7 (seven) days. Reported on 07/05/2015    No current facility-administered medications for this visit.  (Other)      REVIEW OF SYSTEMS:    ALLERGIES Allergies  Allergen Reactions   Shrimp [Shellfish Allergy]     intolerant   Valium [Diazepam] Itching    PAST MEDICAL HISTORY Past Medical History:  Diagnosis Date   Arthritis    Diabetes mellitus without complication (HCC)    GERD (gastroesophageal reflux disease)    Headache(784.0)    Hypertension    Past Surgical History:  Procedure Laterality Date   BACK SURGERY     CERVICAL DISC SURGERY Left 99    FAMILY HISTORY Family History  Problem Relation Age of Onset   Heart disease Mother    Diabetes Mother     SOCIAL HISTORY Social History   Tobacco Use   Smoking status: Current Every Day Smoker    Packs/day: 0.25    Years: 25.00    Pack years: 6.25    Types: Cigarettes   Smokeless tobacco: Never Used   Tobacco comment:     Substance Use Topics   Alcohol use: Yes    Alcohol/week: 2.4 oz    Types: 4 Cans of beer per week    Comment: twice a week   Drug use: No         OPHTHALMIC EXAM:   Not recorded      IMAGING AND PROCEDURES  Imaging and Procedures for 08/14/17  ASSESSMENT/PLAN:    ICD-10-CM   1. Retinal edema H35.81 OCT, Retina - OU - Both Eyes    1.  2.  3.  Ophthalmic Meds Ordered this visit:  No orders of the defined types were placed in this encounter.      No Follow-up on file.  There are no Patient Instructions on file for this visit.   Explained the diagnoses, plan, and follow up with the patient and they expressed understanding.  Patient expressed understanding of the importance of proper follow up care.   This document serves as a record of services personally performed by Karie ChimeraBrian G. Zamora, MD, PhD. It was created on their behalf by Virgilio BellingMeredith Fabian, COA, a certified ophthalmic assistant. The creation of this record is the provider's dictation and/or activities during the  visit.  Electronically signed by: Virgilio BellingMeredith Fabian, COA  08/14/17 4:12 PM    Karie ChimeraBrian G. Zamora, M.D., Ph.D. Diseases & Surgery of the Retina and Vitreous [facility] 08/14/17     Abbreviations: M myopia (nearsighted); A astigmatism; H hyperopia (farsighted); P presbyopia; Mrx spectacle prescription;  CTL contact lenses; OD right eye; OS left eye; OU both eyes  XT exotropia; ET esotropia; PEK punctate epithelial keratitis; PEE punctate epithelial erosions; DES dry eye syndrome; MGD meibomian gland dysfunction; ATs artificial tears; PFAT's preservative free artificial tears; NSC nuclear sclerotic cataract; PSC posterior subcapsular cataract; ERM epi-retinal membrane; PVD posterior vitreous detachment; RD retinal detachment; DM diabetes mellitus; DR diabetic retinopathy; NPDR non-proliferative diabetic retinopathy; PDR proliferative diabetic retinopathy; CSME clinically significant macular edema; DME diabetic macular edema; dbh dot blot hemorrhages; CWS cotton wool spot; POAG primary open angle glaucoma; C/D cup-to-disc ratio; HVF humphrey visual field; GVF goldmann visual field; OCT optical coherence tomography; IOP intraocular pressure; BRVO Branch retinal vein occlusion; CRVO central retinal vein occlusion; CRAO central retinal artery occlusion; BRAO branch retinal artery occlusion; RT retinal tear; SB scleral buckle; PPV pars plana vitrectomy; VH Vitreous hemorrhage; PRP panretinal laser photocoagulation; IVK intravitreal kenalog; VMT vitreomacular traction; MH Macular hole;  NVD neovascularization of the disc; NVE neovascularization elsewhere; AREDS age related eye disease study; ARMD age related macular degeneration; POAG primary open angle glaucoma; EBMD epithelial/anterior basement membrane dystrophy; ACIOL anterior chamber intraocular lens; IOL intraocular lens; PCIOL posterior chamber intraocular lens; Phaco/IOL phacoemulsification with intraocular lens placement; PRK  photorefractive keratectomy; LASIK laser assisted in situ keratomileusis; HTN hypertension; DM diabetes mellitus; COPD chronic obstructive pulmonary disease

## 2017-08-15 ENCOUNTER — Encounter (INDEPENDENT_AMBULATORY_CARE_PROVIDER_SITE_OTHER): Payer: Medicare Other | Admitting: Ophthalmology

## 2017-08-16 DIAGNOSIS — G894 Chronic pain syndrome: Secondary | ICD-10-CM | POA: Diagnosis not present

## 2017-08-16 DIAGNOSIS — M545 Low back pain: Secondary | ICD-10-CM | POA: Diagnosis not present

## 2017-08-16 DIAGNOSIS — M961 Postlaminectomy syndrome, not elsewhere classified: Secondary | ICD-10-CM | POA: Diagnosis not present

## 2017-08-16 DIAGNOSIS — M79604 Pain in right leg: Secondary | ICD-10-CM | POA: Diagnosis not present

## 2017-08-20 ENCOUNTER — Other Ambulatory Visit: Payer: Self-pay | Admitting: Family Medicine

## 2017-08-20 ENCOUNTER — Telehealth: Payer: Self-pay

## 2017-08-20 NOTE — Telephone Encounter (Signed)
Patient notified that it's ok to exercise as tolerated

## 2017-09-04 ENCOUNTER — Other Ambulatory Visit: Payer: Self-pay | Admitting: Family Medicine

## 2017-09-06 ENCOUNTER — Other Ambulatory Visit: Payer: Self-pay

## 2017-09-06 MED ORDER — TRAMADOL HCL 50 MG PO TABS: 50.0000 mg | ORAL_TABLET | Freq: Four times a day (QID) | ORAL | 0 refills | Status: DC

## 2017-09-06 NOTE — Telephone Encounter (Signed)
Patient notified

## 2017-09-06 NOTE — Telephone Encounter (Signed)
Received a refill request for Tramadol. Erroneously approved. Tramadol will not be filled. Patient is established with pain management. Carrie please discard prescription as I am out of the office and notify patient.  [name], MSN, FNP-C [facility]  [address] [place], [place] [address] [phone]

## 2017-09-10 ENCOUNTER — Other Ambulatory Visit: Payer: Self-pay | Admitting: Family Medicine

## 2017-09-10 DIAGNOSIS — K219 Gastro-esophageal reflux disease without esophagitis: Secondary | ICD-10-CM

## 2017-09-21 NOTE — Progress Notes (Signed)
[facility] - Clinic Note  09/24/2017     CHIEF COMPLAINT Patient presents for Diabetic Eye Exam   HISTORY OF PRESENT ILLNESS: Riley Clarke is a 62 y.o. male who presents to the clinic today for:   HPI    Diabetic Eye Exam    Vision is stable.  Associated Symptoms Negative for Flashes, Distortion, Pain, Photophobia, Trauma, Jaw Claudication, Fever, Fatigue, Weight Loss, Shoulder/Hip pain, Scalp Tenderness, Glare, Redness, Blind Spot and Floaters.  Diabetes characteristics include Type 2.  This started years ago.  Blood sugar level is controlled.  Last Blood Glucose 145.  Last A1C 7.2.  I, the attending physician,  performed the HPI with the patient and updated documentation appropriately.          Comments    Pt presents for DM exam; Pt states he has been diabetic "all my life"; Pt states CBG are stable; Pt states he wears specs off and on; Pt states he sees Dr. Mitzi Davenport, states last time he was seen there was "years ago"; Pt reports OU VA is stable; Pt denies floaters, denies flashes, denies wavy VA; Pt denies any gtts usage, denies taking any eye vits;        Last edited by Rennis Chris, MD on 09/24/2017  1:21 PM. (History)      Referring physician: Bing Neighbors, FNP [address] [place], [place] [address]  HISTORICAL INFORMATION:   Selected notes from the MEDICAL RECORD NUMBER Referred by K. Tiburcio Pea, FNP for DM exam;  LEE-  Ocular Hx-  PMH- DM, HTN, hypocholesteremia, current everyday smoker     CURRENT MEDICATIONS: No current outpatient medications on file. (Ophthalmic Drugs)   No current facility-administered medications for this visit.  (Ophthalmic Drugs)   Current Outpatient Medications (Other)  Medication Sig  . esomeprazole (NEXIUM) 40 MG capsule TAKE ONE (1) CAPSULE BY MOUTH EVERY MORNING BEFORE BREAKFAST  . gabapentin (NEURONTIN) 300 MG capsule TAKE 1 CAPSULE BY MOUTH THREE TIMES A DAY  . glucose blood (ACCU-CHEK AVIVA) test strip  Use as instructed  . HYDROcodone-acetaminophen (NORCO) 10-325 MG tablet   . JARDIANCE 25 MG TABS tablet TAKE ONE (1) TABLET BY MOUTH EVERY DAY  . lisinopril (PRINIVIL,ZESTRIL) 10 MG tablet Take 1 tablet (10 mg total) by mouth daily.  . meloxicam (MOBIC) 15 MG tablet TAKE ONE (1) TABLET BY MOUTH EVERY DAY  . metFORMIN (GLUCOPHAGE) 1000 MG tablet Take 1 tablet (1,000 mg total) by mouth 2 (two) times daily with a meal. Reported on 07/05/2015  . sulindac (CLINORIL) 200 MG tablet Take 200 mg by mouth 2 (two) times daily. Reported on 10/14/2015  . TRADJENTA 5 MG TABS tablet TAKE ONE (1) TABLET BY MOUTH EVERY DAY  . traMADol (ULTRAM) 50 MG tablet Take 1 tablet (50 mg total) by mouth 4 (four) times daily.  . TRULICITY 1.5 MG/0.5ML SOPN INJECT 0.5 MILLILITERS INTO THE SKIN ONCE A WEEK  . Vitamin D, Ergocalciferol, (DRISDOL) 50000 UNITS CAPS capsule Take 50,000 Units by mouth every 7 (seven) days. Reported on 07/05/2015   No current facility-administered medications for this visit.  (Other)      REVIEW OF SYSTEMS: ROS    Positive for: Endocrine, Eyes   Negative for: Constitutional, Gastrointestinal, Neurological, Skin, Genitourinary, Musculoskeletal, HENT, Cardiovascular, Respiratory, Psychiatric, Allergic/Imm, Heme/Lymph   Last edited by Concepcion Elk, COA on 09/24/2017 12:52 PM. (History)       ALLERGIES Allergies  Allergen Reactions  . Shrimp [Shellfish Allergy]  intolerant  . Valium [Diazepam] Itching    PAST MEDICAL HISTORY Past Medical History:  Diagnosis Date  . Arthritis   . Diabetes mellitus without complication (HCC)   . GERD (gastroesophageal reflux disease)   . Headache(784.0)   . Hypertension    Past Surgical History:  Procedure Laterality Date  . BACK SURGERY    . CERVICAL DISC SURGERY Left 99    FAMILY HISTORY Family History  Problem Relation Age of Onset  . Heart disease Mother   . Diabetes Mother   . Amblyopia Neg Hx   . Blindness Neg Hx   . Cataracts  Neg Hx   . Hypertension Neg Hx   . Macular degeneration Neg Hx   . Retinal detachment Neg Hx   . Strabismus Neg Hx     SOCIAL HISTORY Social History   Tobacco Use  . Smoking status: Current Every Day Smoker    Packs/day: 0.25    Years: 25.00    Pack years: 6.25    Types: Cigarettes  . Smokeless tobacco: Never Used  . Tobacco comment:     Substance Use Topics  . Alcohol use: Yes    Alcohol/week: 2.4 oz    Types: 4 Cans of beer per week    Comment: twice a week  . Drug use: No         OPHTHALMIC EXAM:  Base Eye Exam    Visual Acuity (Snellen - Linear)      Right Left   Dist [place] 20/20 20/20   Dist cc 20/30 20/25   Dist ph  20/20 20/20   Dist ph cc 20/25 20/20   Correction:  Glasses       Tonometry (Tonopen, 1:10 PM)      Right Left   Pressure 17 18       Pupils      Dark Light Shape React APD   Right 5 4 Round Brisk None   Left 5 4 Round Brisk None       Visual Fields (Counting fingers)      Left Right    Full Full       Extraocular Movement      Right Left    Full, Ortho Full, Ortho       Neuro/Psych    Oriented x3:  Yes   Mood/Affect:  Normal       Dilation    Both eyes:  1.0% Mydriacyl, 2.5% Phenylephrine @ 1:10 PM  Phen 10% instilled @ 1:24 PM        Slit Lamp and Fundus Exam    Slit Lamp Exam      Right Left   Lids/Lashes Dermatochalasis - upper lid, Meibomian gland dysfunction Dermatochalasis - upper lid, Meibomian gland dysfunction   Conjunctiva/Sclera Melanosis Melanosis   Cornea Arcus, otherwise clear Arcus, otherwise clear   Anterior Chamber Deep and quiet Deep and quiet   Iris Round and dilated, No NVI Round and reactive, No NVI   Lens 2+ Nuclear sclerosis, 2+ Cortical cataract 2+ Nuclear sclerosis, 2+ Cortical cataract   Vitreous Mild Vitreous syneresis Mild Vitreous syneresis       Fundus Exam      Right Left   Disc Pink and Sharp Pink and Sharp   C/D Ratio 0.3 0.5   Macula Intraretinal hemorrhage superior to fovea,  rare  Microaneurysms Good foveal reflex, mild Retinal pigment epithelial mottling, No heme or edema   Vessels Mild Copper wiring, mild AV crossing changes Mild Copper  wiring, mild AV crossing changes   Periphery Attached Attached        Refraction    Wearing Rx      Sphere Cylinder Axis Add   Right -1.00 +0.75 015 +2.50   Left -0.75 +0.50 180 +2.50   Age:  Years   Type:  Bifocal       Manifest Refraction      Sphere Dist VA   Right [place] 20/20   Left [place] 20/20          IMAGING AND PROCEDURES  Imaging and Procedures for 09/24/17  OCT, Retina - OU - Both Eyes       Right Eye Quality was good. Central Foveal Thickness: 282. Progression has no prior data. Findings include normal foveal contour, no IRF, no SRF.   Left Eye Quality was good. Central Foveal Thickness: 281. Progression has no prior data. Findings include normal foveal contour, no IRF, no SRF.   Notes *Images captured and stored on drive  Diagnosis / Impression:  No DME OU  Clinical management:  See below  Abbreviations: NFP - Normal foveal profile. CME - cystoid macular edema. PED - pigment epithelial detachment. IRF - intraretinal fluid. SRF - subretinal fluid. EZ - ellipsoid zone. ERM - epiretinal membrane. ORA - outer retinal atrophy. ORT - outer retinal tubulation. SRHM - subretinal hyper-reflective material                  ASSESSMENT/PLAN:    ICD-10-CM   1. Diabetes mellitus type 2 without retinopathy (HCC) E11.9   2. Retinal edema H35.81 OCT, Retina - OU - Both Eyes  3. 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 Diabetes mellitus, type 2 without retinopathy - The incidence, risk factors for progression, natural history and treatment options for diabetic retinopathy  were discussed with patient.   - The need for close monitoring of blood glucose, blood pressure, and serum lipids, avoiding cigarette or any type of tobacco, and the need for long term follow up was  also discussed with patient. - single IRH superior to fovea OD - f/u in 1 year, sooner prn  2,3. Hypertensive retinopathy OU - discussed importance of tight BP control - monitor  4. No retinal edema on exam or OCT  5. Combined form age-related cataract OU-  - The symptoms of cataract, surgical options, and treatments and risks were discussed with patient. - discussed diagnosis and progression - not yet visually significant - monitor for now   Ophthalmic Meds Ordered this visit:  No orders of the defined types were placed in this encounter.      Return in about 1 year (around 09/25/2018) for DM exam, Dilated exam, OCT.  There are no Patient Instructions on file for this visit.   Explained the diagnoses, plan, and follow up with the patient and they expressed understanding.  Patient expressed understanding of the importance of proper follow up care.   This document serves as a record of services personally performed by Karie Chimera, MD, PhD. It was created on their behalf by Virgilio Belling, COA, a certified ophthalmic assistant. The creation of this record is the provider's dictation and/or activities during the visit.  Electronically signed by: Virgilio Belling, COA  09/24/17 1:44 PM   Karie Chimera, M.D., Ph.D. Diseases & Surgery of the Retina and Vitreous [facility] 09/24/17   I have reviewed the above documentation for accuracy and completeness, and I  agree with the above. Karie ChimeraBrian G. Shivam Mestas, M.D., Ph.D. 09/24/17 1:44 PM    Abbreviations: M myopia (nearsighted); A astigmatism; H hyperopia (farsighted); P presbyopia; Mrx spectacle prescription;  CTL contact lenses; OD right eye; OS left eye; OU both eyes  XT exotropia; ET esotropia; PEK punctate epithelial keratitis; PEE punctate epithelial erosions; DES dry eye syndrome; MGD meibomian gland dysfunction; ATs artificial tears; PFAT's preservative free artificial tears; NSC nuclear sclerotic  cataract; PSC posterior subcapsular cataract; ERM epi-retinal membrane; PVD posterior vitreous detachment; RD retinal detachment; DM diabetes mellitus; DR diabetic retinopathy; NPDR non-proliferative diabetic retinopathy; PDR proliferative diabetic retinopathy; CSME clinically significant macular edema; DME diabetic macular edema; dbh dot blot hemorrhages; CWS cotton wool spot; POAG primary open angle glaucoma; C/D cup-to-disc ratio; HVF humphrey visual field; GVF goldmann visual field; OCT optical coherence tomography; IOP intraocular pressure; BRVO Branch retinal vein occlusion; CRVO central retinal vein occlusion; CRAO central retinal artery occlusion; BRAO branch retinal artery occlusion; RT retinal tear; SB scleral buckle; PPV pars plana vitrectomy; VH Vitreous hemorrhage; PRP panretinal laser photocoagulation; IVK intravitreal kenalog; VMT vitreomacular traction; MH Macular hole;  NVD neovascularization of the disc; NVE neovascularization elsewhere; AREDS age related eye disease study; ARMD age related macular degeneration; POAG primary open angle glaucoma; EBMD epithelial/anterior basement membrane dystrophy; ACIOL anterior chamber intraocular lens; IOL intraocular lens; PCIOL posterior chamber intraocular lens; Phaco/IOL phacoemulsification with intraocular lens placement; PRK photorefractive keratectomy; LASIK laser assisted in situ keratomileusis; HTN hypertension; DM diabetes mellitus; COPD chronic obstructive pulmonary disease

## 2017-09-24 ENCOUNTER — Encounter (INDEPENDENT_AMBULATORY_CARE_PROVIDER_SITE_OTHER): Payer: Self-pay | Admitting: Ophthalmology

## 2017-09-24 ENCOUNTER — Ambulatory Visit (INDEPENDENT_AMBULATORY_CARE_PROVIDER_SITE_OTHER): Payer: Medicare Other | Admitting: Ophthalmology

## 2017-09-24 DIAGNOSIS — H35033 Hypertensive retinopathy, bilateral: Secondary | ICD-10-CM

## 2017-09-24 DIAGNOSIS — I10 Essential (primary) hypertension: Secondary | ICD-10-CM | POA: Diagnosis not present

## 2017-09-24 DIAGNOSIS — E119 Type 2 diabetes mellitus without complications: Secondary | ICD-10-CM | POA: Diagnosis not present

## 2017-09-24 DIAGNOSIS — H25813 Combined forms of age-related cataract, bilateral: Secondary | ICD-10-CM | POA: Diagnosis not present

## 2017-09-24 DIAGNOSIS — H3581 Retinal edema: Secondary | ICD-10-CM | POA: Diagnosis not present

## 2017-10-10 ENCOUNTER — Other Ambulatory Visit: Payer: Self-pay | Admitting: Family Medicine

## 2017-11-12 ENCOUNTER — Other Ambulatory Visit: Payer: Self-pay | Admitting: Family Medicine

## 2017-11-26 ENCOUNTER — Ambulatory Visit: Payer: Medicare Other | Admitting: Podiatry

## 2017-11-29 ENCOUNTER — Ambulatory Visit (INDEPENDENT_AMBULATORY_CARE_PROVIDER_SITE_OTHER): Payer: Medicare Other | Admitting: Podiatry

## 2017-11-29 ENCOUNTER — Encounter: Payer: Self-pay | Admitting: Podiatry

## 2017-11-29 DIAGNOSIS — M722 Plantar fascial fibromatosis: Secondary | ICD-10-CM

## 2017-11-29 MED ORDER — TRIAMCINOLONE ACETONIDE 10 MG/ML IJ SUSP
10.0000 mg | Freq: Once | INTRAMUSCULAR | Status: AC
  Administered 2017-11-29: 10 mg

## 2017-11-29 NOTE — Progress Notes (Signed)
Subjective:   Patient ID: Riley Clarke, male   DOB: 62 y.o.   MRN: [id number]   HPI Patient presents with a lot of pain in his heels of both feet that are making it hard for him to be comfortable F2 neurovascular status intact with exquisite discomfort of the plantar heel bilateral   ROS      Objective:  Physical Exam  Plantar fasciitis with description above     Assessment:  Acute plantar fasciitis bilateral     Plan:  Injected the plantar fascial bilateral 3 mg Kenalog 5 mg Xylocaine under sterile technique and tolerated well.  Reappoint as needed

## 2017-12-10 ENCOUNTER — Other Ambulatory Visit: Payer: Self-pay | Admitting: Family Medicine

## 2017-12-28 ENCOUNTER — Other Ambulatory Visit: Payer: Self-pay | Admitting: Family Medicine

## 2017-12-28 NOTE — Telephone Encounter (Signed)
Patient has appointment with you in august is in okay to fill 30 days

## 2018-01-09 ENCOUNTER — Ambulatory Visit (INDEPENDENT_AMBULATORY_CARE_PROVIDER_SITE_OTHER): Payer: Medicare Other | Admitting: Family Medicine

## 2018-01-09 ENCOUNTER — Encounter: Payer: Self-pay | Admitting: Family Medicine

## 2018-01-09 VITALS — BP 130/72 | HR 74 | Temp 98.1°F | Ht 73.0 in | Wt 233.0 lb

## 2018-01-09 DIAGNOSIS — G6289 Other specified polyneuropathies: Secondary | ICD-10-CM | POA: Diagnosis not present

## 2018-01-09 DIAGNOSIS — I10 Essential (primary) hypertension: Secondary | ICD-10-CM | POA: Diagnosis not present

## 2018-01-09 DIAGNOSIS — E119 Type 2 diabetes mellitus without complications: Secondary | ICD-10-CM | POA: Diagnosis not present

## 2018-01-09 DIAGNOSIS — R232 Flushing: Secondary | ICD-10-CM | POA: Diagnosis not present

## 2018-01-09 DIAGNOSIS — G894 Chronic pain syndrome: Secondary | ICD-10-CM

## 2018-01-09 DIAGNOSIS — Z09 Encounter for follow-up examination after completed treatment for conditions other than malignant neoplasm: Secondary | ICD-10-CM

## 2018-01-09 LAB — POCT URINALYSIS DIP (MANUAL ENTRY)
Bilirubin, UA: NEGATIVE
Blood, UA: NEGATIVE
Glucose, UA: 500 mg/dL — AB
Ketones, POC UA: NEGATIVE mg/dL
Leukocytes, UA: NEGATIVE
Nitrite, UA: NEGATIVE
Protein Ur, POC: NEGATIVE mg/dL
Spec Grav, UA: 1.015 (ref 1.010–1.025)
Urobilinogen, UA: 0.2 E.U./dL
pH, UA: 5.5 (ref 5.0–8.0)

## 2018-01-09 LAB — GLUCOSE, POCT (MANUAL RESULT ENTRY): POC Glucose: 115 mg/dl — AB (ref 70–99)

## 2018-01-09 LAB — POCT GLYCOSYLATED HEMOGLOBIN (HGB A1C): Hemoglobin A1C: 6.9 % — AB (ref 4.0–5.6)

## 2018-01-09 MED ORDER — PREGABALIN 25 MG PO CAPS
25.0000 mg | ORAL_CAPSULE | Freq: Two times a day (BID) | ORAL | 1 refills | Status: DC
Start: 2018-01-09 — End: 2018-12-23

## 2018-01-09 NOTE — Patient Instructions (Signed)
Pregabalin capsules What is this medicine? PREGABALIN (pre GAB a lin) is used to treat nerve pain from diabetes, shingles, spinal cord injury, and fibromyalgia. It is also used to control seizures in epilepsy. This medicine may be used for other purposes; ask your health care provider or pharmacist if you have questions. COMMON BRAND NAME(S): Lyrica What should I tell my health care provider before I take this medicine? They need to know if you have any of these conditions: -bleeding problems -heart disease, including heart failure -history of alcohol or drug abuse -kidney disease -suicidal thoughts, plans, or attempt; a previous suicide attempt by you or a family member -an unusual or allergic reaction to pregabalin, gabapentin, other medicines, foods, dyes, or preservatives -pregnant or trying to get pregnant or trying to conceive with your partner -breast-feeding How should I use this medicine? Take this medicine by mouth with a glass of water. Follow the directions on the prescription label. You can take this medicine with or without food. Take your doses at regular intervals. Do not take your medicine more often than directed. Do not stop taking except on your doctor's advice. A special MedGuide will be given to you by the pharmacist with each prescription and refill. Be sure to read this information carefully each time. Talk to your pediatrician regarding the use of this medicine in children. Special care may be needed. Overdosage: If you think you have taken too much of this medicine contact a poison control center or emergency room at once. NOTE: This medicine is only for you. Do not share this medicine with others. What if I miss a dose? If you miss a dose, take it as soon as you can. If it is almost time for your next dose, take only that dose. Do not take double or extra doses. What may interact with this medicine? -alcohol -certain medicines for blood pressure like captopril,  enalapril, or lisinopril -certain medicines for diabetes, like pioglitazone or rosiglitazone -certain medicines for anxiety or sleep -narcotic medicines for pain This list may not describe all possible interactions. Give your health care provider a list of all the medicines, herbs, non-prescription drugs, or dietary supplements you use. Also tell them if you smoke, drink alcohol, or use illegal drugs. Some items may interact with your medicine. What should I watch for while using this medicine? Tell your doctor or healthcare professional if your symptoms do not start to get better or if they get worse. Visit your doctor or health care professional for regular checks on your progress. Do not stop taking except on your doctor's advice. You may develop a severe reaction. Your doctor will tell you how much medicine to take. Wear a medical identification bracelet or chain if you are taking this medicine for seizures, and carry a card that describes your disease and details of your medicine and dosage times. You may get drowsy or dizzy. Do not drive, use machinery, or do anything that needs mental alertness until you know how this medicine affects you. Do not stand or sit up quickly, especially if you are an older patient. This reduces the risk of dizzy or fainting spells. Alcohol may interfere with the effect of this medicine. Avoid alcoholic drinks. If you have a heart condition, like congestive heart failure, and notice that you are retaining water and have swelling in your hands or feet, contact your health care provider immediately. The use of this medicine may increase the chance of suicidal thoughts or actions. Pay special attention   to how you are responding while on this medicine. Any worsening of mood, or thoughts of suicide or dying should be reported to your health care professional right away. This medicine has caused reduced sperm counts in some men. This may interfere with the ability to father a  child. You should talk to your doctor or health care professional if you are concerned about your fertility. Women who become pregnant while using this medicine for seizures may enroll in the [place] American Antiepileptic Drug Pregnancy Registry by calling 1-888-233-2334. This registry collects information about the safety of antiepileptic drug use during pregnancy. What side effects may I notice from receiving this medicine? Side effects that you should report to your doctor or health care professional as soon as possible: -allergic reactions like skin rash, itching or hives, swelling of the face, lips, or tongue -breathing problems -changes in vision -chest pain -confusion -jerking or unusual movements of any part of your body -loss of memory -muscle pain, tenderness, or weakness -suicidal thoughts or other mood changes -swelling of the ankles, feet, hands -unusual bruising or bleeding Side effects that usually do not require medical attention (report to your doctor or health care professional if they continue or are bothersome): -dizziness -drowsiness -dry mouth -headache -nausea -tremors -trouble sleeping -weight gain This list may not describe all possible side effects. Call your doctor for medical advice about side effects. You may report side effects to FDA at 1-800-FDA-1088. Where should I keep my medicine? Keep out of the reach of children. This medicine can be abused. Keep your medicine in a safe place to protect it from theft. Do not share this medicine with anyone. Selling or giving away this medicine is dangerous and against the law. This medicine may cause accidental overdose and death if it taken by other adults, children, or pets. Mix any unused medicine with a substance like cat litter or coffee grounds. Then throw the medicine away in a sealed container like a sealed bag or a coffee can with a lid. Do not use the medicine after the expiration date. Store at room  temperature between 15 and 30 degrees C (59 and 86 degrees F). NOTE: This sheet is a summary. It may not cover all possible information. If you have questions about this medicine, talk to your doctor, pharmacist, or health care provider.  2018 Elsevier/Gold Standard (2015-07-01 10:26:12)  

## 2018-01-09 NOTE — Progress Notes (Signed)
Follow Up  Subjective:    Patient ID: Riley Clarke, male    DOB: 1955/09/25, 62 y.o.   MRN: [id number]   Chief Complaint  Patient presents with  . Follow-up    6months on diabetes  . Night Sweats    HPI  Past Medical History:  Diagnosis Date  . Arthritis   . Diabetes mellitus without complication (HCC)   . GERD (gastroesophageal reflux disease)   . Headache(784.0)   . Hypertension    Current Status: Since his last office visit, he is doing well with no complaints. He does continue to have chronic back pain and would like to be referred by pain management. He denies fevers, chills, fatigue, recent infections, weight loss, and night sweats. He has not had any headaches, visual changes, dizziness, and falls. No chest pain, heart palpitations, cough and shortness of breath reported. No reports of GI problems such as nausea, vomiting, diarrhea, and constipation. He has no reports of blood in stools, dysuria and hematuria. No depression or anxiety.   Family History  Problem Relation Age of Onset  . Heart disease Mother   . Diabetes Mother   . Amblyopia Neg Hx   . Blindness Neg Hx   . Cataracts Neg Hx   . Hypertension Neg Hx   . Macular degeneration Neg Hx   . Retinal detachment Neg Hx   . Strabismus Neg Hx     Social History   Socioeconomic History  . Marital status: Single    Spouse name: Not on file  . Number of children: Not on file  . Years of education: Not on file  . Highest education level: Not on file  Occupational History  . Not on file  Social Needs  . Financial resource strain: Not on file  . Food insecurity:    Worry: Not on file    Inability: Not on file  . Transportation needs:    Medical: Not on file    Non-medical: Not on file  Tobacco Use  . Smoking status: Current Every Day Smoker    Packs/day: 0.25    Years: 25.00    Pack years: 6.25    Types: Cigarettes  . Smokeless tobacco: Never Used  . Tobacco comment:     Substance and Sexual  Activity  . Alcohol use: Yes    Alcohol/week: 2.4 oz    Types: 4 Cans of beer per week    Comment: twice a week  . Drug use: No  . Sexual activity: Not on file  Lifestyle  . Physical activity:    Days per week: Not on file    Minutes per session: Not on file  . Stress: Not on file  Relationships  . Social connections:    Talks on phone: Not on file    Gets together: Not on file    Attends religious service: Not on file    Active member of club or organization: Not on file    Attends meetings of clubs or organizations: Not on file    Relationship status: Not on file  . Intimate partner violence:    Fear of current or ex partner: Not on file    Emotionally abused: Not on file    Physically abused: Not on file    Forced sexual activity: Not on file  Other Topics Concern  . Not on file  Social History Narrative  . Not on file    Past Surgical History:  Procedure Laterality Date  .  BACK SURGERY    . CERVICAL DISC SURGERY Left 99   Immunization History  Administered Date(s) Administered  . Influenza,inj,Quad PF,6+ Mos 04/01/2015, 02/04/2016, 02/19/2017  . Pneumococcal Conjugate-13 04/01/2015  . Pneumococcal Polysaccharide-23 05/08/2016  . Tdap 09/06/2014, 04/01/2015    Current Meds  Medication Sig  . esomeprazole (NEXIUM) 40 MG capsule TAKE ONE (1) CAPSULE BY MOUTH EVERY MORNING BEFORE BREAKFAST  . gabapentin (NEURONTIN) 300 MG capsule TAKE 1 CAPSULE BY MOUTH THREE TIMES A DAY  . HYDROcodone-acetaminophen (NORCO) 10-325 MG tablet   . JARDIANCE 25 MG TABS tablet TAKE ONE (1) TABLET BY MOUTH EVERY DAY  . meloxicam (MOBIC) 15 MG tablet TAKE ONE (1) TABLET BY MOUTH EVERY DAY  . metFORMIN (GLUCOPHAGE) 1000 MG tablet Take 1 tablet (1,000 mg total) by mouth 2 (two) times daily with a meal. Reported on 07/05/2015  . sulindac (CLINORIL) 200 MG tablet Take 200 mg by mouth 2 (two) times daily. Reported on 10/14/2015  . TRADJENTA 5 MG TABS tablet TAKE ONE TABLET BY MOUTH EVERY DAY  .  traMADol (ULTRAM) 50 MG tablet Take 1 tablet (50 mg total) by mouth 4 (four) times daily.  . TRULICITY 1.5 MG/0.5ML SOPN INJECT 0.5 MILLILITERS INTO THE SKIN ONCE A WEEK  . Vitamin D, Ergocalciferol, (DRISDOL) 50000 UNITS CAPS capsule Take 50,000 Units by mouth every 7 (seven) days. Reported on 07/05/2015   Allergies  Allergen Reactions  . Shrimp [Shellfish Allergy]     intolerant  . Valium [Diazepam] Itching    BP 130/72 (BP Location: Left Arm, Patient Position: Sitting, Cuff Size: Large)   Pulse 74   Temp 98.1 F (36.7 C) (Oral)   Ht 6\' 1"  (1.854 m)   Wt 233 lb (105.7 kg)   SpO2 98%   BMI 30.74 kg/m   Review of Systems  Constitutional: Negative.   HENT: Negative.   Eyes: Negative.   Respiratory: Negative.   Cardiovascular: Negative.   Gastrointestinal: Negative.   Endocrine: Negative.   Genitourinary: Negative.   Musculoskeletal: Positive for back pain (Moderate ).  Skin: Negative.   Allergic/Immunologic: Negative.   Neurological: Negative.   Hematological: Negative.   Psychiatric/Behavioral: Negative.    Objective:   Physical Exam  Constitutional: He is oriented to person, place, and time. He appears well-developed and well-nourished.  HENT:  Head: Normocephalic and atraumatic.  Right Ear: External ear normal.  Left Ear: External ear normal.  Nose: Nose normal.  Mouth/Throat: Oropharynx is clear and moist.  Eyes: Pupils are equal, round, and reactive to light. Conjunctivae are normal.  Neck: Normal range of motion. Neck supple.  Cardiovascular: Normal rate, regular rhythm, normal heart sounds and intact distal pulses.  Pulmonary/Chest: Effort normal and breath sounds normal.  Abdominal: Soft. Bowel sounds are normal.  Musculoskeletal:  Limited ROM in back  Neurological: He is alert and oriented to person, place, and time.  Skin: Skin is warm and dry. Capillary refill takes less than 2 seconds.  Psychiatric: He has a normal mood and affect. His behavior is  normal. Judgment and thought content normal.  Nursing note and vitals reviewed.  Assessment & Plan:   1. Type 2 diabetes mellitus without complication, unspecified whether long term insulin use (HCC) Glucose level is at 115 today. He will continue Metformin and Jardiance as prescribed.  - POCT glycosylated hemoglobin (Hb A1C) - POCT urinalysis dipstick - POCT glucose (manual entry)  2. Essential hypertension Blood pressure is stable at 130/72 today. Monitor.   3. Chronic pain syndrome -  Ambulatory referral to Pain Clinic  4. Other polyneuropathy Stable Continue Gabapentin as prescribed.   5. Encounter for diabetic foot exam (HCC) Positive foot sensation in both feet.   6. Hot flashes We will initiate Lyrica today.   7. Follow up He will follow up in 3 months.   Meds ordered this encounter  Medications  . pregabalin (LYRICA) 25 MG capsule    Sig: Take 1 capsule (25 mg total) by mouth 2 (two) times daily.    Dispense:  60 capsule    Refill:  1   Raliegh Ip,  MSN, FNP-C Patient [facility] [address] [place], [place] [address] [phone]

## 2018-01-16 ENCOUNTER — Other Ambulatory Visit: Payer: Self-pay | Admitting: Family Medicine

## 2018-01-22 ENCOUNTER — Ambulatory Visit: Payer: Medicare Other | Admitting: Family Medicine

## 2018-01-31 ENCOUNTER — Telehealth: Payer: Self-pay

## 2018-02-01 DIAGNOSIS — Z79899 Other long term (current) drug therapy: Secondary | ICD-10-CM | POA: Diagnosis not present

## 2018-02-01 DIAGNOSIS — M5441 Lumbago with sciatica, right side: Secondary | ICD-10-CM | POA: Diagnosis not present

## 2018-02-01 DIAGNOSIS — Z87891 Personal history of nicotine dependence: Secondary | ICD-10-CM | POA: Diagnosis not present

## 2018-02-01 DIAGNOSIS — M5442 Lumbago with sciatica, left side: Secondary | ICD-10-CM | POA: Diagnosis not present

## 2018-02-01 DIAGNOSIS — E119 Type 2 diabetes mellitus without complications: Secondary | ICD-10-CM | POA: Diagnosis not present

## 2018-02-04 NOTE — Telephone Encounter (Signed)
Patient states that he does not need to seen [facility] anymore because he found another clinic

## 2018-02-05 ENCOUNTER — Other Ambulatory Visit: Payer: Self-pay | Admitting: Family Medicine

## 2018-02-19 ENCOUNTER — Other Ambulatory Visit: Payer: Self-pay | Admitting: Family Medicine

## 2018-02-20 ENCOUNTER — Telehealth: Payer: Self-pay

## 2018-02-20 MED ORDER — MELOXICAM 15 MG PO TABS: ORAL_TABLET | ORAL | 0 refills | Status: DC

## 2018-02-20 NOTE — Telephone Encounter (Signed)
Medication sent to [facility]

## 2018-03-06 DIAGNOSIS — G8929 Other chronic pain: Secondary | ICD-10-CM | POA: Diagnosis not present

## 2018-03-06 DIAGNOSIS — M545 Low back pain: Secondary | ICD-10-CM | POA: Diagnosis not present

## 2018-03-06 DIAGNOSIS — M5442 Lumbago with sciatica, left side: Secondary | ICD-10-CM | POA: Diagnosis not present

## 2018-03-06 DIAGNOSIS — M5441 Lumbago with sciatica, right side: Secondary | ICD-10-CM | POA: Diagnosis not present

## 2018-03-06 DIAGNOSIS — Z79899 Other long term (current) drug therapy: Secondary | ICD-10-CM | POA: Diagnosis not present

## 2018-03-12 ENCOUNTER — Other Ambulatory Visit: Payer: Self-pay | Admitting: Family Medicine

## 2018-03-12 DIAGNOSIS — K219 Gastro-esophageal reflux disease without esophagitis: Secondary | ICD-10-CM

## 2018-03-19 ENCOUNTER — Other Ambulatory Visit: Payer: Self-pay

## 2018-03-19 DIAGNOSIS — K219 Gastro-esophageal reflux disease without esophagitis: Secondary | ICD-10-CM

## 2018-03-19 MED ORDER — ESOMEPRAZOLE MAGNESIUM 40 MG PO CPDR: DELAYED_RELEASE_CAPSULE | ORAL | 0 refills | Status: DC

## 2018-03-19 NOTE — Telephone Encounter (Signed)
Medication sent to pharmacy  

## 2018-03-20 ENCOUNTER — Other Ambulatory Visit: Payer: Self-pay | Admitting: Family Medicine

## 2018-03-22 ENCOUNTER — Other Ambulatory Visit: Payer: Self-pay | Admitting: Family Medicine

## 2018-03-22 DIAGNOSIS — H35033 Hypertensive retinopathy, bilateral: Secondary | ICD-10-CM | POA: Diagnosis not present

## 2018-03-22 DIAGNOSIS — H2513 Age-related nuclear cataract, bilateral: Secondary | ICD-10-CM | POA: Diagnosis not present

## 2018-03-22 DIAGNOSIS — H25013 Cortical age-related cataract, bilateral: Secondary | ICD-10-CM | POA: Diagnosis not present

## 2018-03-22 DIAGNOSIS — E119 Type 2 diabetes mellitus without complications: Secondary | ICD-10-CM | POA: Diagnosis not present

## 2018-03-26 ENCOUNTER — Encounter: Payer: Self-pay | Admitting: Podiatry

## 2018-03-26 ENCOUNTER — Ambulatory Visit (INDEPENDENT_AMBULATORY_CARE_PROVIDER_SITE_OTHER): Payer: Medicare Other | Admitting: Podiatry

## 2018-03-26 DIAGNOSIS — M722 Plantar fascial fibromatosis: Secondary | ICD-10-CM | POA: Diagnosis not present

## 2018-03-26 MED ORDER — TRIAMCINOLONE ACETONIDE 10 MG/ML IJ SUSP
10.0000 mg | Freq: Once | INTRAMUSCULAR | Status: AC
  Administered 2018-03-26: 10 mg

## 2018-04-02 ENCOUNTER — Encounter: Payer: Self-pay | Admitting: Family Medicine

## 2018-04-02 ENCOUNTER — Ambulatory Visit (INDEPENDENT_AMBULATORY_CARE_PROVIDER_SITE_OTHER): Payer: Medicare Other | Admitting: Family Medicine

## 2018-04-02 VITALS — BP 140/84 | HR 84 | Temp 98.0°F | Ht 73.0 in | Wt 223.4 lb

## 2018-04-02 DIAGNOSIS — Z23 Encounter for immunization: Secondary | ICD-10-CM

## 2018-04-02 DIAGNOSIS — Z09 Encounter for follow-up examination after completed treatment for conditions other than malignant neoplasm: Secondary | ICD-10-CM

## 2018-04-02 DIAGNOSIS — G8929 Other chronic pain: Secondary | ICD-10-CM

## 2018-04-02 DIAGNOSIS — G6289 Other specified polyneuropathies: Secondary | ICD-10-CM

## 2018-04-02 DIAGNOSIS — E119 Type 2 diabetes mellitus without complications: Secondary | ICD-10-CM

## 2018-04-02 DIAGNOSIS — I10 Essential (primary) hypertension: Secondary | ICD-10-CM | POA: Diagnosis not present

## 2018-04-02 DIAGNOSIS — M549 Dorsalgia, unspecified: Secondary | ICD-10-CM | POA: Diagnosis not present

## 2018-04-02 LAB — POCT URINALYSIS DIP (MANUAL ENTRY)
Bilirubin, UA: NEGATIVE
Blood, UA: NEGATIVE
Glucose, UA: >=1000 mg/dL — AB
Ketones, POC UA: NEGATIVE mg/dL
Leukocytes, UA: NEGATIVE
Nitrite, UA: NEGATIVE
Protein Ur, POC: NEGATIVE mg/dL
Spec Grav, UA: 1.01 (ref 1.010–1.025)
Urobilinogen, UA: 0.2 E.U./dL
pH, UA: 5.5 (ref 5.0–8.0)

## 2018-04-02 LAB — POCT GLYCOSYLATED HEMOGLOBIN (HGB A1C): Hemoglobin A1C: 7.3 % — AB (ref 4.0–5.6)

## 2018-04-02 LAB — GLUCOSE, POCT (MANUAL RESULT ENTRY): POC Glucose: 155 mg/dl — AB (ref 70–99)

## 2018-04-02 NOTE — Progress Notes (Signed)
Follow Up  Subjective:    Patient ID: Riley Clarke, male    DOB: April 25, 1956, 62 y.o.   MRN: [id number]   Chief Complaint  Patient presents with  . Follow-up    3 month on chronic condition    HPI  Mr Alkhatib is a 62 year old male with a past medical history of Hypertension, Headaches, GERD, Diabetes, and Arthritis. He is here today for follow up and assessment of chronic diseases.   Current Status: Since her last office visit, she is doing well. He states that he has mild pain in his right small finger for the last 3 weeks. He states that pain medications does not have do not relieve discomfort. He continues to follow up to Pain Management on 04/05/2018.   He denies fevers, chills, fatigue, recent infections, weight loss, and night sweats. He has not had any headaches, visual changes, dizziness, and falls. No chest pain, heart palpitations, cough and shortness of breath reported. No reports of GI problems such as nausea, vomiting, diarrhea, and constipation. He has no reports of blood in stools, dysuria and hematuria. No depression or anxiety reported. He denies pain today.   Past Medical History:  Diagnosis Date  . Arthritis   . Diabetes mellitus without complication (HCC)   . GERD (gastroesophageal reflux disease)   . Headache(784.0)   . Hypertension     Family History  Problem Relation Age of Onset  . Heart disease Mother   . Diabetes Mother   . Amblyopia Neg Hx   . Blindness Neg Hx   . Cataracts Neg Hx   . Hypertension Neg Hx   . Macular degeneration Neg Hx   . Retinal detachment Neg Hx   . Strabismus Neg Hx     Social History   Socioeconomic History  . Marital status: Single    Spouse name: Not on file  . Number of children: Not on file  . Years of education: Not on file  . Highest education level: Not on file  Occupational History  . Not on file  Social Needs  . Financial resource strain: Not on file  . Food insecurity:    Worry: Not on file     Inability: Not on file  . Transportation needs:    Medical: Not on file    Non-medical: Not on file  Tobacco Use  . Smoking status: Current Every Day Smoker    Packs/day: 0.25    Years: 25.00    Pack years: 6.25    Types: Cigarettes  . Smokeless tobacco: Never Used  . Tobacco comment:     Substance and Sexual Activity  . Alcohol use: Yes    Alcohol/week: 4.0 standard drinks    Types: 4 Cans of beer per week    Comment: twice a week  . Drug use: No  . Sexual activity: Not on file  Lifestyle  . Physical activity:    Days per week: Not on file    Minutes per session: Not on file  . Stress: Not on file  Relationships  . Social connections:    Talks on phone: Not on file    Gets together: Not on file    Attends religious service: Not on file    Active member of club or organization: Not on file    Attends meetings of clubs or organizations: Not on file    Relationship status: Not on file  . Intimate partner violence:    Fear of current or  ex partner: Not on file    Emotionally abused: Not on file    Physically abused: Not on file    Forced sexual activity: Not on file  Other Topics Concern  . Not on file  Social History Narrative  . Not on file    Past Surgical History:  Procedure Laterality Date  . BACK SURGERY    . CERVICAL DISC SURGERY Left 99    Immunization History  Administered Date(s) Administered  . Influenza,inj,Quad PF,6+ Mos 04/01/2015, 02/04/2016, 02/19/2017, 04/02/2018  . Pneumococcal Conjugate-13 04/01/2015  . Pneumococcal Polysaccharide-23 05/08/2016  . Tdap 09/06/2014, 04/01/2015    Current Meds  Medication Sig  . esomeprazole (NEXIUM) 40 MG capsule TAKE ONE (1) CAPSULE BY MOUTH EVERY MORNING BEFORE BREAKFAST  . gabapentin (NEURONTIN) 300 MG capsule TAKE 1 CAPSULE BY MOUTH THREE TIMES A DAY  . glucose blood (ACCU-CHEK AVIVA) test strip Use as instructed  . HYDROcodone-acetaminophen (NORCO) 10-325 MG tablet   . JARDIANCE 25 MG TABS tablet TAKE  ONE (1) TABLET BY MOUTH EVERY DAY  . meloxicam (MOBIC) 15 MG tablet TAKE ONE (1) TABLET BY MOUTH EVERY DAY  . metFORMIN (GLUCOPHAGE) 1000 MG tablet TAKE 1 TABLET BY MOUTH 2 TIMES DAILY WITH A MEAL.  . pregabalin (LYRICA) 25 MG capsule Take 1 capsule (25 mg total) by mouth 2 (two) times daily.  . sulindac (CLINORIL) 200 MG tablet Take 200 mg by mouth 2 (two) times daily. Reported on 10/14/2015  . TRADJENTA 5 MG TABS tablet TAKE ONE TABLET BY MOUTH EVERY DAY  . TRULICITY 1.5 MG/0.5ML SOPN INJECT 0.5 MILLILITERS INTO THE SKIN ONCE A WEEK  . Vitamin D, Ergocalciferol, (DRISDOL) 50000 UNITS CAPS capsule Take 50,000 Units by mouth every 7 (seven) days. Reported on 07/05/2015    Allergies  Allergen Reactions  . Shrimp [Shellfish Allergy]     intolerant  . Valium [Diazepam] Itching    BP 140/84 (BP Location: Left Arm, Patient Position: Sitting, Cuff Size: Large)   Pulse 84   Temp 98 F (36.7 C) (Oral)   Ht 6\' 1"  (1.854 m)   Wt 223 lb 6.4 oz (101.3 kg)   SpO2 100%   BMI 29.47 kg/m   Review of Systems  Constitutional: Negative.   HENT: Negative.   Respiratory: Negative.   Cardiovascular: Negative.   Genitourinary: Negative.   Musculoskeletal: Positive for back pain (chronic).  Skin: Negative.   Neurological: Negative.   Psychiatric/Behavioral: Negative.    Objective:   Physical Exam  Constitutional: He is oriented to person, place, and time. He appears well-developed and well-nourished.  HENT:  Head: Normocephalic and atraumatic.  Right Ear: External ear normal.  Left Ear: External ear normal.  Nose: Nose normal.  Mouth/Throat: Oropharynx is clear and moist.  Eyes: Pupils are equal, round, and reactive to light. Conjunctivae and EOM are normal.  Neck: Normal range of motion. Neck supple.  Cardiovascular: Normal rate, regular rhythm, normal heart sounds and intact distal pulses.  Pulmonary/Chest: Effort normal and breath sounds normal.  Abdominal: Soft. Bowel sounds are normal.   Musculoskeletal: Normal range of motion.  Neurological: He is alert and oriented to person, place, and time.  Skin: Skin is warm and dry.  Psychiatric: He has a normal mood and affect. His behavior is normal. Judgment and thought content normal.  Nursing note and vitals reviewed.  Assessment & Plan:   1. Type 2 diabetes mellitus without complication, unspecified whether long term insulin use (HCC) Hgb A1c is increased at 7.3 today,  from  - POCT urinalysis dipstick - POCT glycosylated hemoglobin (Hb A1C) - POCT glucose (manual entry)  2. Essential hypertension Antihypertensive medication is effective. Blood pressure is 140/84 today. He will continue Lisinopril as prescribed. She will continue to decrease high sodium intake, excessive alcohol intake, increase potassium intake, smoking cessation, and increase physical activity of at least 30 minutes of cardio activity daily. She will continue to follow Heart Healthy or DASH diet.   3. Chronic back pain, unspecified back location, unspecified back pain laterality He will continue to follow up with Pain Management.   4. Other polyneuropathy Stable. Continue Gabapentin as prescribed.   5. Need for immunization against influenza - Flu Vaccine QUAD 36+ mos IM  6. Follow up He will follow up in 6 months.   No orders of the defined types were placed in this encounter.  Raliegh Ip,  MSN, FNP-C [facility] [address] [place], [place] [address] [phone]

## 2018-04-03 NOTE — Progress Notes (Signed)
Subjective:   Patient ID: Riley Clarke, male   DOB: 62 y.o.   MRN: [id number]   HPI Patient states his heels of started to hurt again and again he is not a good candidate for surgery and the injections helped him for several months at a time   ROS      Objective:  Physical Exam  Neurovascular status unchanged with pain in the plantar aspect of the heels bilateral that has been chronic and gets better for periods of time with injection     Assessment:  Chronic plantar fasciitis bilateral     Plan:  Injected the plantar fascial bilateral 3 mg Kenalog 5 mg Xylocaine advised on supportive shoes and reappoint as needed

## 2018-04-05 DIAGNOSIS — R5383 Other fatigue: Secondary | ICD-10-CM | POA: Diagnosis not present

## 2018-04-05 DIAGNOSIS — M129 Arthropathy, unspecified: Secondary | ICD-10-CM | POA: Diagnosis not present

## 2018-04-05 DIAGNOSIS — M961 Postlaminectomy syndrome, not elsewhere classified: Secondary | ICD-10-CM | POA: Diagnosis not present

## 2018-04-05 DIAGNOSIS — G894 Chronic pain syndrome: Secondary | ICD-10-CM | POA: Diagnosis not present

## 2018-04-05 DIAGNOSIS — Z79899 Other long term (current) drug therapy: Secondary | ICD-10-CM | POA: Diagnosis not present

## 2018-04-08 ENCOUNTER — Other Ambulatory Visit: Payer: Self-pay | Admitting: Family Medicine

## 2018-04-09 ENCOUNTER — Other Ambulatory Visit: Payer: Self-pay

## 2018-04-09 MED ORDER — DULAGLUTIDE 1.5 MG/0.5ML ~~LOC~~ SOAJ: SUBCUTANEOUS | 3 refills | Status: DC

## 2018-04-09 NOTE — Telephone Encounter (Signed)
Medication sent to pharmacy  

## 2018-04-11 ENCOUNTER — Ambulatory Visit: Payer: Medicare Other | Admitting: Family Medicine

## 2018-04-18 ENCOUNTER — Other Ambulatory Visit: Payer: Self-pay | Admitting: Family Medicine

## 2018-04-22 ENCOUNTER — Other Ambulatory Visit: Payer: Self-pay

## 2018-04-22 ENCOUNTER — Ambulatory Visit (INDEPENDENT_AMBULATORY_CARE_PROVIDER_SITE_OTHER): Payer: Medicare Other | Admitting: Podiatry

## 2018-04-22 ENCOUNTER — Encounter: Payer: Self-pay | Admitting: Podiatry

## 2018-04-22 DIAGNOSIS — M722 Plantar fascial fibromatosis: Secondary | ICD-10-CM | POA: Diagnosis not present

## 2018-04-22 MED ORDER — TRIAMCINOLONE ACETONIDE 10 MG/ML IJ SUSP
10.0000 mg | Freq: Once | INTRAMUSCULAR | Status: AC
  Administered 2018-04-22: 10 mg

## 2018-04-22 MED ORDER — LINAGLIPTIN 5 MG PO TABS: 5.0000 mg | ORAL_TABLET | Freq: Every day | ORAL | 3 refills | Status: DC

## 2018-04-22 NOTE — Telephone Encounter (Signed)
Medication sent to pharmacy  

## 2018-04-25 NOTE — Progress Notes (Signed)
Subjective:   Patient ID: Riley Clarke, male   DOB: 62 y.o.   MRN: [id number]   HPI Patient states getting pain in the outside of both heels and the bottom and it does not seem that the medication help me very much   ROS      Objective:  Physical Exam  Neurovascular status intact with the plantar medial aspect of the heel showing improvement but the lateral side showing inflammation which may be compensatory in nature     Assessment:  Medial band fasciitis which seems improved with lateral band fasciitis     Plan:  Discussed injections to the lateral band explaining that at this point that would probably be the best chance we have to get this better.  I did do lateral band injections 3 mg Kenalog 5 mg Xylocaine advised on physical therapy anti-inflammatory supportive shoe and patient will be seen back to recheck again and we are continuing to try to avoid surgery if possible

## 2018-04-26 ENCOUNTER — Other Ambulatory Visit: Payer: Self-pay | Admitting: Family Medicine

## 2018-05-06 DIAGNOSIS — M961 Postlaminectomy syndrome, not elsewhere classified: Secondary | ICD-10-CM | POA: Diagnosis not present

## 2018-05-06 DIAGNOSIS — Z79899 Other long term (current) drug therapy: Secondary | ICD-10-CM | POA: Diagnosis not present

## 2018-05-06 DIAGNOSIS — G894 Chronic pain syndrome: Secondary | ICD-10-CM | POA: Diagnosis not present

## 2018-05-28 ENCOUNTER — Other Ambulatory Visit: Payer: Self-pay

## 2018-05-28 ENCOUNTER — Other Ambulatory Visit: Payer: Self-pay | Admitting: Family Medicine

## 2018-06-06 DIAGNOSIS — M961 Postlaminectomy syndrome, not elsewhere classified: Secondary | ICD-10-CM | POA: Diagnosis not present

## 2018-06-06 DIAGNOSIS — M48062 Spinal stenosis, lumbar region with neurogenic claudication: Secondary | ICD-10-CM | POA: Diagnosis not present

## 2018-06-06 DIAGNOSIS — R768 Other specified abnormal immunological findings in serum: Secondary | ICD-10-CM | POA: Diagnosis not present

## 2018-06-06 DIAGNOSIS — Z79899 Other long term (current) drug therapy: Secondary | ICD-10-CM | POA: Diagnosis not present

## 2018-06-11 ENCOUNTER — Other Ambulatory Visit: Payer: Self-pay | Admitting: Family Medicine

## 2018-06-11 DIAGNOSIS — K219 Gastro-esophageal reflux disease without esophagitis: Secondary | ICD-10-CM

## 2018-06-19 ENCOUNTER — Other Ambulatory Visit: Payer: Self-pay | Admitting: Family Medicine

## 2018-06-19 DIAGNOSIS — K219 Gastro-esophageal reflux disease without esophagitis: Secondary | ICD-10-CM

## 2018-06-21 ENCOUNTER — Telehealth: Payer: Self-pay

## 2018-06-21 ENCOUNTER — Other Ambulatory Visit: Payer: Self-pay | Admitting: Family Medicine

## 2018-06-21 DIAGNOSIS — K219 Gastro-esophageal reflux disease without esophagitis: Secondary | ICD-10-CM

## 2018-06-21 MED ORDER — ESOMEPRAZOLE MAGNESIUM 40 MG PO CPDR: DELAYED_RELEASE_CAPSULE | ORAL | 0 refills | Status: DC

## 2018-06-21 NOTE — Telephone Encounter (Signed)
Medication sent to pharmacy  

## 2018-06-27 ENCOUNTER — Other Ambulatory Visit: Payer: Self-pay

## 2018-06-27 DIAGNOSIS — K219 Gastro-esophageal reflux disease without esophagitis: Secondary | ICD-10-CM

## 2018-06-27 MED ORDER — METFORMIN HCL 1000 MG PO TABS: ORAL_TABLET | ORAL | 1 refills | Status: DC

## 2018-06-27 MED ORDER — ESOMEPRAZOLE MAGNESIUM 40 MG PO CPDR: DELAYED_RELEASE_CAPSULE | ORAL | 1 refills | Status: DC

## 2018-06-27 NOTE — Telephone Encounter (Signed)
Medication sent.

## 2018-06-28 ENCOUNTER — Other Ambulatory Visit: Payer: Self-pay

## 2018-06-28 DIAGNOSIS — K219 Gastro-esophageal reflux disease without esophagitis: Secondary | ICD-10-CM

## 2018-06-28 MED ORDER — ESOMEPRAZOLE MAGNESIUM 40 MG PO CPDR: DELAYED_RELEASE_CAPSULE | ORAL | 1 refills | Status: DC

## 2018-06-28 MED ORDER — METFORMIN HCL 1000 MG PO TABS: ORAL_TABLET | ORAL | 1 refills | Status: DC

## 2018-06-28 NOTE — Telephone Encounter (Signed)
Medication sent to pharmacy  

## 2018-07-03 DIAGNOSIS — G894 Chronic pain syndrome: Secondary | ICD-10-CM | POA: Diagnosis not present

## 2018-07-03 DIAGNOSIS — Z79899 Other long term (current) drug therapy: Secondary | ICD-10-CM | POA: Diagnosis not present

## 2018-07-03 DIAGNOSIS — M961 Postlaminectomy syndrome, not elsewhere classified: Secondary | ICD-10-CM | POA: Diagnosis not present

## 2018-07-03 DIAGNOSIS — E119 Type 2 diabetes mellitus without complications: Secondary | ICD-10-CM | POA: Diagnosis not present

## 2018-07-20 ENCOUNTER — Other Ambulatory Visit: Payer: Self-pay | Admitting: Family Medicine

## 2018-07-22 ENCOUNTER — Telehealth: Payer: Self-pay

## 2018-07-22 MED ORDER — DULAGLUTIDE 1.5 MG/0.5ML ~~LOC~~ SOAJ: SUBCUTANEOUS | 3 refills | Status: DC

## 2018-07-23 ENCOUNTER — Other Ambulatory Visit: Payer: Self-pay | Admitting: Family Medicine

## 2018-07-24 ENCOUNTER — Encounter: Payer: Self-pay | Admitting: Podiatry

## 2018-07-24 ENCOUNTER — Ambulatory Visit (INDEPENDENT_AMBULATORY_CARE_PROVIDER_SITE_OTHER): Payer: Medicare Other | Admitting: Podiatry

## 2018-07-24 DIAGNOSIS — M722 Plantar fascial fibromatosis: Secondary | ICD-10-CM

## 2018-07-24 MED ORDER — TRIAMCINOLONE ACETONIDE 10 MG/ML IJ SUSP
10.0000 mg | Freq: Once | INTRAMUSCULAR | Status: AC
  Administered 2018-07-24: 10 mg

## 2018-07-26 NOTE — Progress Notes (Signed)
Subjective:   Patient ID: Riley Clarke, male   DOB: 63 y.o.   MRN: [id number]   HPI Patient states I had several months of relief but it started to come back again and I still want to avoid surgery   ROS      Objective:  Physical Exam  Neurovascular status intact with patient found to have exquisite discomfort in the plantar fascial bilateral at the insertion of the tendon calcaneus     Assessment:  Acute plantar fasciitis bilateral     Plan:  Sterile prep applied bilateral and injected the fascia bilateral 3 mg Kenalog 5 mg Xylocaine and advised on supportive shoe therapy and reappoint to recheck

## 2018-08-02 DIAGNOSIS — M961 Postlaminectomy syndrome, not elsewhere classified: Secondary | ICD-10-CM | POA: Diagnosis not present

## 2018-08-02 DIAGNOSIS — M5136 Other intervertebral disc degeneration, lumbar region: Secondary | ICD-10-CM | POA: Diagnosis not present

## 2018-08-02 DIAGNOSIS — G894 Chronic pain syndrome: Secondary | ICD-10-CM | POA: Diagnosis not present

## 2018-08-02 DIAGNOSIS — Z79899 Other long term (current) drug therapy: Secondary | ICD-10-CM | POA: Diagnosis not present

## 2018-08-06 DIAGNOSIS — Z0000 Encounter for general adult medical examination without abnormal findings: Secondary | ICD-10-CM | POA: Diagnosis not present

## 2018-08-06 DIAGNOSIS — E7800 Pure hypercholesterolemia, unspecified: Secondary | ICD-10-CM | POA: Diagnosis not present

## 2018-08-06 DIAGNOSIS — E119 Type 2 diabetes mellitus without complications: Secondary | ICD-10-CM | POA: Diagnosis not present

## 2018-08-06 DIAGNOSIS — I10 Essential (primary) hypertension: Secondary | ICD-10-CM | POA: Diagnosis not present

## 2018-08-06 DIAGNOSIS — R5383 Other fatigue: Secondary | ICD-10-CM | POA: Diagnosis not present

## 2018-08-06 DIAGNOSIS — Z760 Encounter for issue of repeat prescription: Secondary | ICD-10-CM | POA: Diagnosis not present

## 2018-08-06 DIAGNOSIS — R768 Other specified abnormal immunological findings in serum: Secondary | ICD-10-CM | POA: Diagnosis not present

## 2018-08-06 DIAGNOSIS — Z87891 Personal history of nicotine dependence: Secondary | ICD-10-CM | POA: Diagnosis not present

## 2018-08-06 DIAGNOSIS — E559 Vitamin D deficiency, unspecified: Secondary | ICD-10-CM | POA: Diagnosis not present

## 2018-08-08 ENCOUNTER — Other Ambulatory Visit: Payer: Self-pay | Admitting: Internal Medicine

## 2018-08-08 DIAGNOSIS — Z122 Encounter for screening for malignant neoplasm of respiratory organs: Secondary | ICD-10-CM

## 2018-08-15 DIAGNOSIS — R0989 Other specified symptoms and signs involving the circulatory and respiratory systems: Secondary | ICD-10-CM | POA: Diagnosis not present

## 2018-08-15 DIAGNOSIS — M79605 Pain in left leg: Secondary | ICD-10-CM | POA: Diagnosis not present

## 2018-08-15 DIAGNOSIS — M79604 Pain in right leg: Secondary | ICD-10-CM | POA: Diagnosis not present

## 2018-08-19 ENCOUNTER — Other Ambulatory Visit: Payer: Self-pay | Admitting: Family Medicine

## 2018-08-27 DIAGNOSIS — E119 Type 2 diabetes mellitus without complications: Secondary | ICD-10-CM | POA: Diagnosis not present

## 2018-08-27 DIAGNOSIS — E559 Vitamin D deficiency, unspecified: Secondary | ICD-10-CM | POA: Diagnosis not present

## 2018-08-27 DIAGNOSIS — Z79899 Other long term (current) drug therapy: Secondary | ICD-10-CM | POA: Diagnosis not present

## 2018-08-30 DIAGNOSIS — Z79899 Other long term (current) drug therapy: Secondary | ICD-10-CM | POA: Diagnosis not present

## 2018-08-30 DIAGNOSIS — M961 Postlaminectomy syndrome, not elsewhere classified: Secondary | ICD-10-CM | POA: Diagnosis not present

## 2018-08-30 DIAGNOSIS — M5136 Other intervertebral disc degeneration, lumbar region: Secondary | ICD-10-CM | POA: Diagnosis not present

## 2018-08-30 DIAGNOSIS — G894 Chronic pain syndrome: Secondary | ICD-10-CM | POA: Diagnosis not present

## 2018-09-25 ENCOUNTER — Encounter (INDEPENDENT_AMBULATORY_CARE_PROVIDER_SITE_OTHER): Payer: Medicare Other | Admitting: Ophthalmology

## 2018-09-26 ENCOUNTER — Encounter: Payer: Self-pay | Admitting: Podiatry

## 2018-09-26 ENCOUNTER — Other Ambulatory Visit: Payer: Self-pay

## 2018-09-26 ENCOUNTER — Ambulatory Visit (INDEPENDENT_AMBULATORY_CARE_PROVIDER_SITE_OTHER): Payer: Medicare Other | Admitting: Podiatry

## 2018-09-26 DIAGNOSIS — M722 Plantar fascial fibromatosis: Secondary | ICD-10-CM

## 2018-09-26 MED ORDER — TRIAMCINOLONE ACETONIDE 10 MG/ML IJ SUSP
10.0000 mg | Freq: Once | INTRAMUSCULAR | Status: AC
  Administered 2018-09-26: 15:00:00 10 mg

## 2018-09-30 DIAGNOSIS — G894 Chronic pain syndrome: Secondary | ICD-10-CM | POA: Diagnosis not present

## 2018-09-30 DIAGNOSIS — Z79899 Other long term (current) drug therapy: Secondary | ICD-10-CM | POA: Diagnosis not present

## 2018-09-30 DIAGNOSIS — M5136 Other intervertebral disc degeneration, lumbar region: Secondary | ICD-10-CM | POA: Diagnosis not present

## 2018-09-30 NOTE — Progress Notes (Signed)
Subjective:   Patient ID: Riley Clarke, male   DOB: 63 y.o.   MRN: [id number]   HPI Patient presents stating his heels are still really hurting and at one point he wants to consider a more definitive solution   ROS      Objective:  Physical Exam  Neurovascular status intact with patient's plantar heels remaining chronically sore and we continue to work with this with conservative treatment but it seems that slowly is becoming less and less effective and wears we used to get a number of months relief now he is down to about 2 months     Assessment:  Plantar fasciitis bilateral that continues to give trouble     Plan:  Discussed possible surgery but will get a hold off currently due to the virus situation.  I did go ahead did sterile prep of each medial side injected the plantar fascia bilateral 3 mg Kenalog V nerve Xylocaine to try to reduce the acute inflammation explained we cannot continue to do this and will have to look at a more definitive solution and we will see him back 2 months or earlier if needed

## 2018-10-02 ENCOUNTER — Ambulatory Visit: Payer: Medicare Other | Admitting: Family Medicine

## 2018-10-11 NOTE — Telephone Encounter (Signed)
Message sent to provider 

## 2018-10-17 NOTE — Telephone Encounter (Signed)
Message sent to provider 

## 2018-10-30 DIAGNOSIS — Z79899 Other long term (current) drug therapy: Secondary | ICD-10-CM | POA: Diagnosis not present

## 2018-10-30 DIAGNOSIS — M5136 Other intervertebral disc degeneration, lumbar region: Secondary | ICD-10-CM | POA: Diagnosis not present

## 2018-10-30 DIAGNOSIS — G894 Chronic pain syndrome: Secondary | ICD-10-CM | POA: Diagnosis not present

## 2018-11-01 IMAGING — MR MR LUMBAR SPINE W/O CM
4 of 5 series · 26 of 48 positions shown · non-contrast
Comparison: 05/23/2013

CLINICAL DATA: Lower back pain radiating to both legs with numbness
and weakness.

EXAM:
MRI LUMBAR SPINE WITHOUT CONTRAST
TECHNIQUE: Multiplanar, multisequence MR imaging of the lumbar spine was
performed. No intravenous contrast was administered.

[Series 3: T2 post-contrast · sagittal · 4.0mm · 0.55mm/px · 6 of 15 slices shown]
[im 1/15]
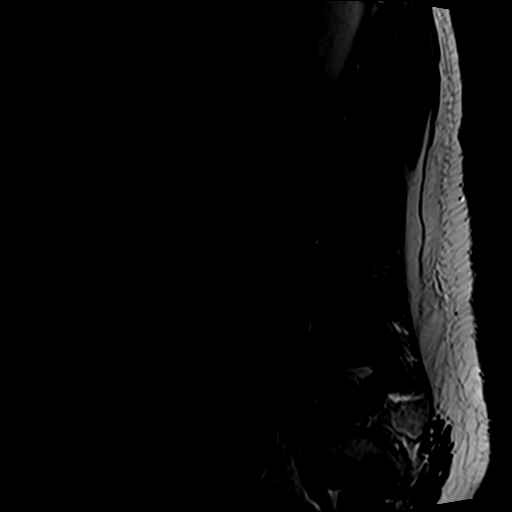
[im 3/15]
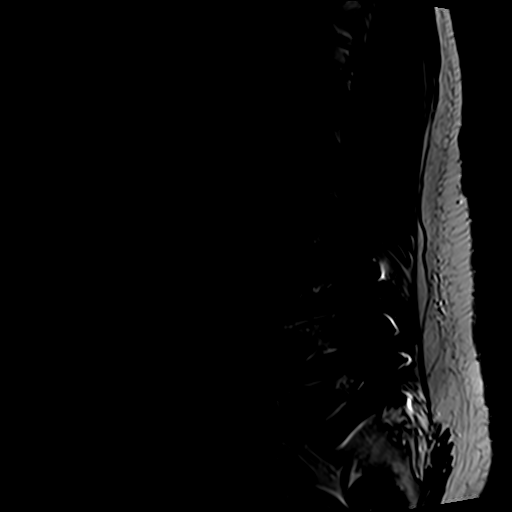
[im 6/15]
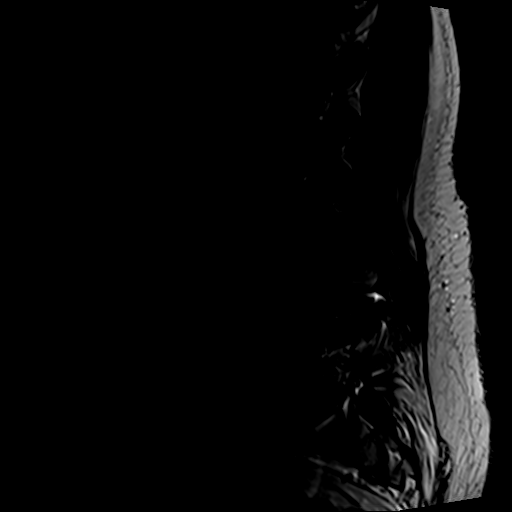
[im 9/15]
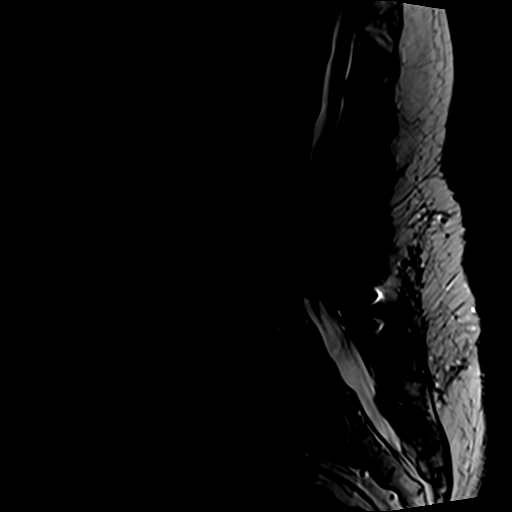
[im 12/15]
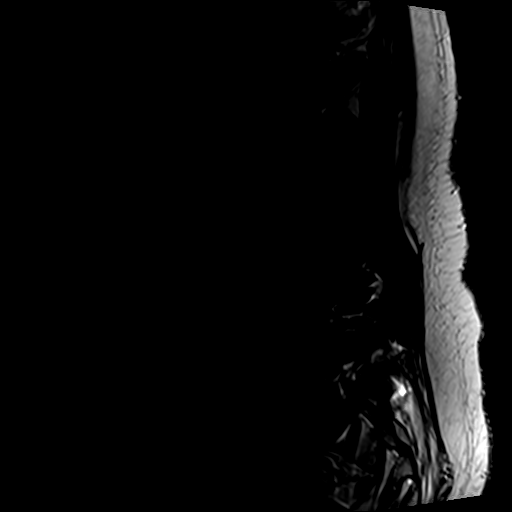
[im 15/15]
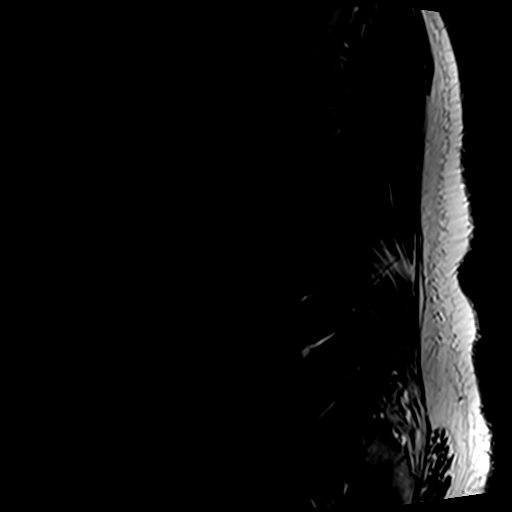

[Series 5: T1 · sagittal · 4.0mm · 0.55mm/px · 6 of 15 slices shown (1 of 2)]
[im 1/15]
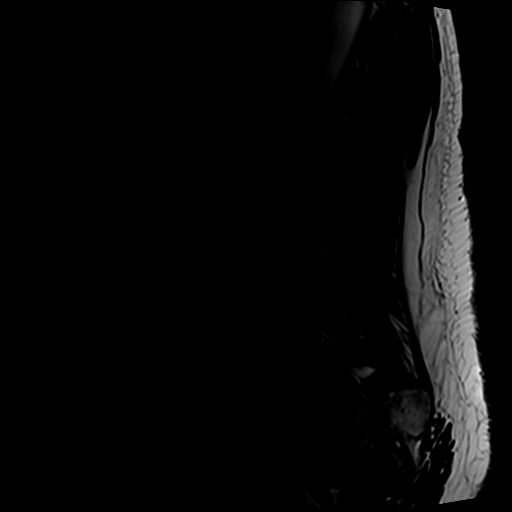
[im 3/15]
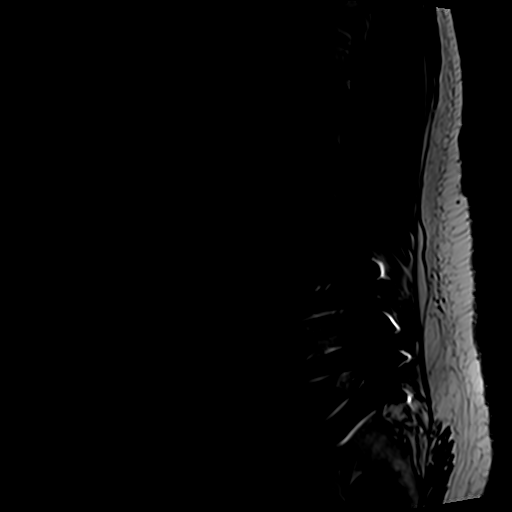
[im 6/15]
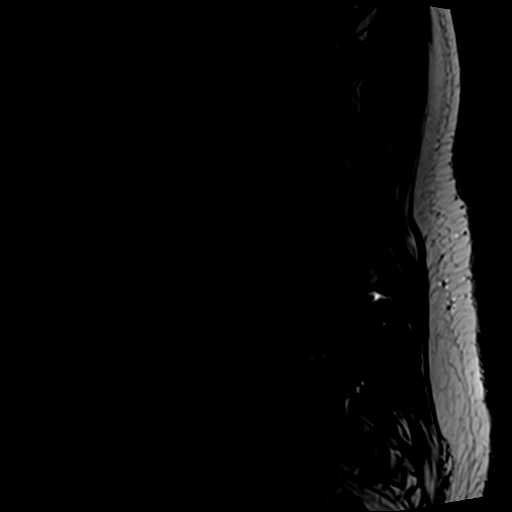
[im 9/15]
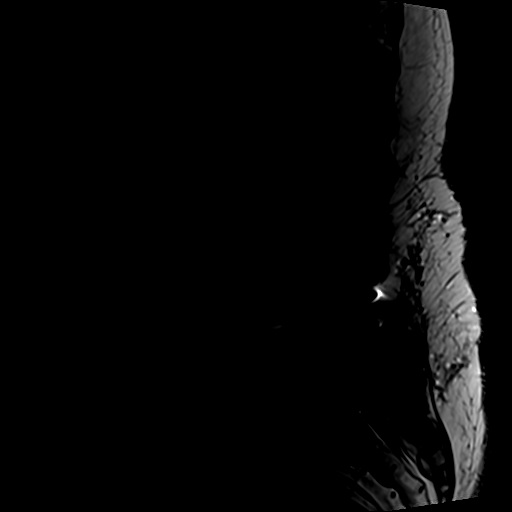
[im 12/15]
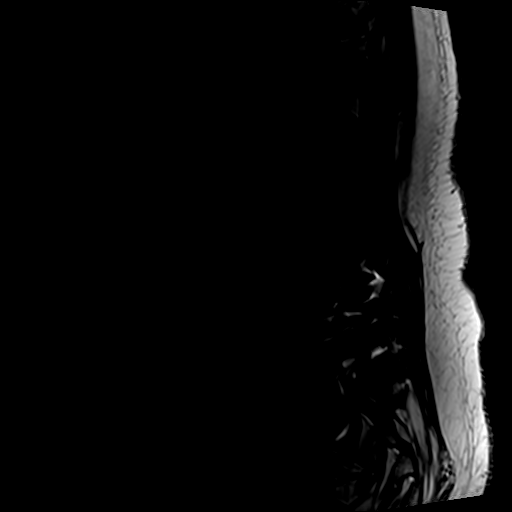
[im 15/15]
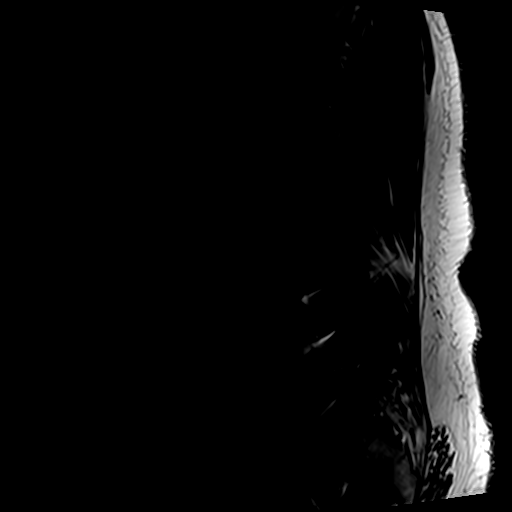

[Series 6: T1 · axial · 4.0mm · 0.35mm/px · z∈[-48,+134]mm · 5 of 38 slices shown (2 of 2)]
[im 1/38]
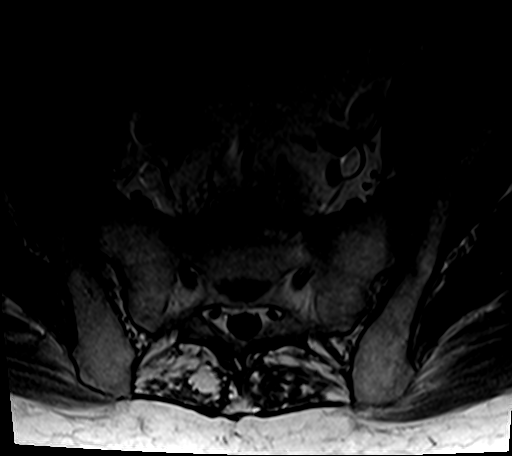
[im 6/38]
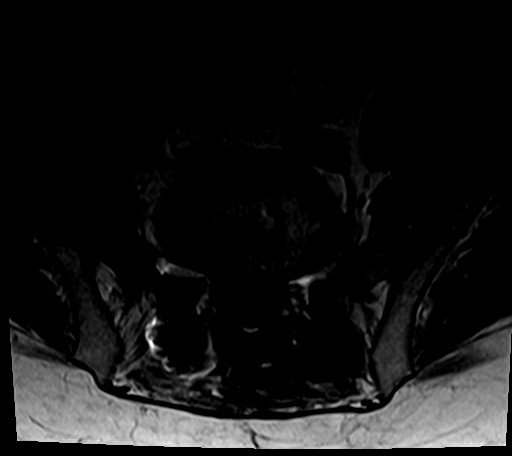
[im 11/38]
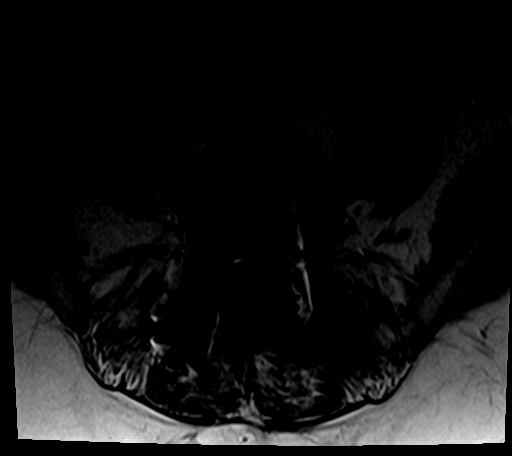
[im 19/38]
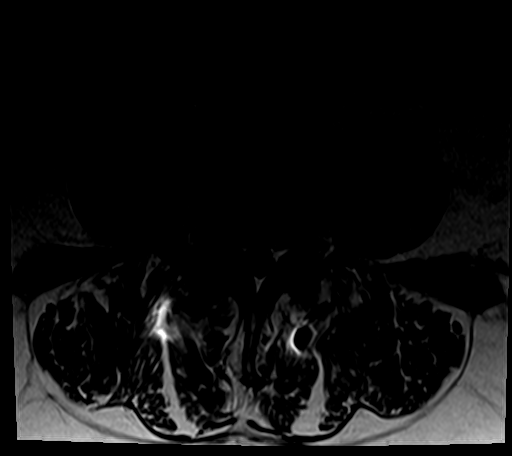
[im 32/38]
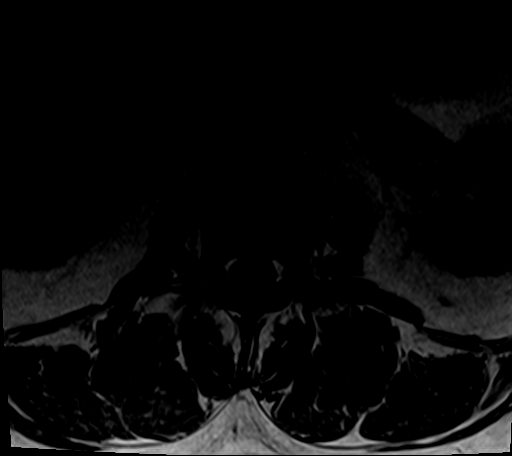

[Series 7: T2 · axial · 4.0mm · 0.70mm/px · z∈[-48,+165]mm · 9 of 38 slices shown]
[im 1/38]
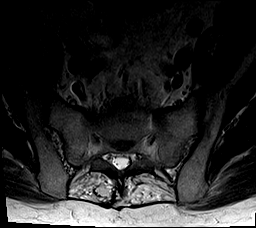
[im 6/38]
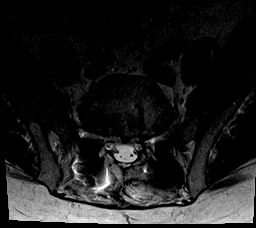
[im 11/38]
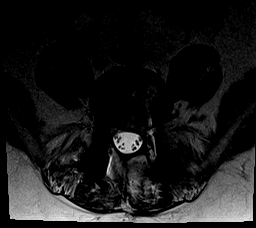
[im 16/38]
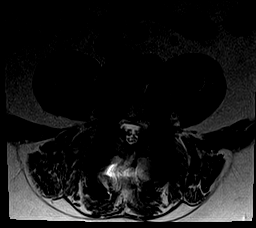
[im 19/38]
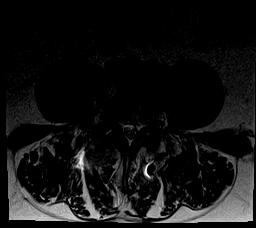
[im 22/38]
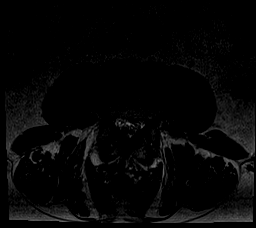
[im 27/38]
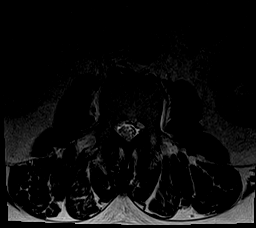
[im 32/38]
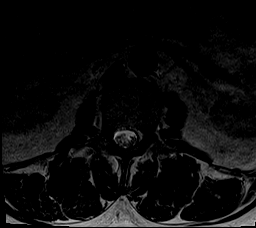
[im 38/38]
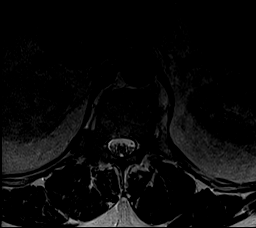

[26 of 48 positions shown; findings below may reference images not displayed]

FINDINGS: Segmentation:  Standard.

Alignment:  Slight L3-4 retrolisthesis.

Vertebrae: No fracture, evidence of discitis, or bone lesion.
Postoperative changes described below

Conus medullaris: Extends to the T12-L1 level and appears normal.

Paraspinal and other soft tissues: Expected postoperative distortion
of back muscles in the lower lumbar region.

Disc levels:

T12- L1: Spondylosis.  No impingement

L1-L2: Disc narrowing and mild bulging. Facet arthropathy with bony
and ligamentous hypertrophy. No significant change from prior. No
impingement

L2-L3: Disc narrowing and bulging and prominent posterior element
hypertrophy. Advanced spinal stenosis, progressed. Mild left and
high-grade right foraminal stenosis due to hypertrophic facets.

L3-L4: Disc narrowing and bulging with endplate spurs. Hypertrophic
facets with ligamentum flavum thickening. High-grade spinal
stenosis, progressed. There is right more than left subarticular
recess impingement. Right L3 foraminal compression. Left foraminal
stenosis is mild.

L4-L5: Discectomy with probable solid arthrodesis. Posterior
decompression. No impingement.

L5-S1:Discectomy with solid arthrodesis. Decompressive laminectomy.
No impingement.
IMPRESSION: 1. L2-3 and L3-4 advanced spinal and right foraminal stenosis from
disc bulge and facet hypertrophy, progressed from 4486.
2. L4-5 and L5-S1 discectomy with probable solid arthrodesis. No
residual impingement.

## 2018-11-06 ENCOUNTER — Encounter (HOSPITAL_COMMUNITY): Payer: Self-pay

## 2018-11-14 ENCOUNTER — Other Ambulatory Visit: Payer: Self-pay | Admitting: Family Medicine

## 2018-11-29 DIAGNOSIS — G894 Chronic pain syndrome: Secondary | ICD-10-CM | POA: Diagnosis not present

## 2018-11-29 DIAGNOSIS — Z79899 Other long term (current) drug therapy: Secondary | ICD-10-CM | POA: Diagnosis not present

## 2018-11-29 DIAGNOSIS — E119 Type 2 diabetes mellitus without complications: Secondary | ICD-10-CM | POA: Diagnosis not present

## 2018-11-29 DIAGNOSIS — M25511 Pain in right shoulder: Secondary | ICD-10-CM | POA: Diagnosis not present

## 2018-11-29 DIAGNOSIS — M5136 Other intervertebral disc degeneration, lumbar region: Secondary | ICD-10-CM | POA: Diagnosis not present

## 2018-12-03 DIAGNOSIS — E7800 Pure hypercholesterolemia, unspecified: Secondary | ICD-10-CM | POA: Diagnosis not present

## 2018-12-03 DIAGNOSIS — E119 Type 2 diabetes mellitus without complications: Secondary | ICD-10-CM | POA: Diagnosis not present

## 2018-12-03 DIAGNOSIS — S46011A Strain of muscle(s) and tendon(s) of the rotator cuff of right shoulder, initial encounter: Secondary | ICD-10-CM | POA: Diagnosis not present

## 2018-12-03 DIAGNOSIS — I10 Essential (primary) hypertension: Secondary | ICD-10-CM | POA: Diagnosis not present

## 2018-12-03 DIAGNOSIS — M48062 Spinal stenosis, lumbar region with neurogenic claudication: Secondary | ICD-10-CM | POA: Diagnosis not present

## 2018-12-03 DIAGNOSIS — Z79899 Other long term (current) drug therapy: Secondary | ICD-10-CM | POA: Diagnosis not present

## 2018-12-04 DIAGNOSIS — M25511 Pain in right shoulder: Secondary | ICD-10-CM | POA: Diagnosis not present

## 2018-12-09 ENCOUNTER — Encounter (INDEPENDENT_AMBULATORY_CARE_PROVIDER_SITE_OTHER): Payer: Medicare Other | Admitting: Ophthalmology

## 2018-12-10 DIAGNOSIS — M25511 Pain in right shoulder: Secondary | ICD-10-CM | POA: Diagnosis not present

## 2018-12-17 ENCOUNTER — Other Ambulatory Visit: Payer: Self-pay | Admitting: Family Medicine

## 2018-12-23 ENCOUNTER — Other Ambulatory Visit: Payer: Self-pay

## 2018-12-23 ENCOUNTER — Encounter (HOSPITAL_BASED_OUTPATIENT_CLINIC_OR_DEPARTMENT_OTHER): Payer: Self-pay | Admitting: *Deleted

## 2018-12-26 ENCOUNTER — Other Ambulatory Visit (HOSPITAL_COMMUNITY)
Admission: RE | Admit: 2018-12-26 | Discharge: 2018-12-26 | Disposition: A | Discharge status: Home / Self Care | Payer: Medicare Other | Source: Ambulatory Visit | Attending: Orthopaedic Surgery | Admitting: Orthopaedic Surgery

## 2018-12-26 ENCOUNTER — Encounter (HOSPITAL_BASED_OUTPATIENT_CLINIC_OR_DEPARTMENT_OTHER)
Admission: RE | Admit: 2018-12-26 | Discharge: 2018-12-26 | Disposition: A | Discharge status: Home / Self Care | Payer: Medicare Other | Source: Ambulatory Visit | Attending: Orthopaedic Surgery | Admitting: Orthopaedic Surgery

## 2018-12-26 ENCOUNTER — Other Ambulatory Visit: Payer: Self-pay

## 2018-12-26 DIAGNOSIS — E119 Type 2 diabetes mellitus without complications: Secondary | ICD-10-CM | POA: Diagnosis not present

## 2018-12-26 DIAGNOSIS — Z8249 Family history of ischemic heart disease and other diseases of the circulatory system: Secondary | ICD-10-CM | POA: Diagnosis not present

## 2018-12-26 DIAGNOSIS — Z1159 Encounter for screening for other viral diseases: Secondary | ICD-10-CM | POA: Diagnosis not present

## 2018-12-26 DIAGNOSIS — I10 Essential (primary) hypertension: Secondary | ICD-10-CM | POA: Diagnosis not present

## 2018-12-26 DIAGNOSIS — R51 Headache: Secondary | ICD-10-CM | POA: Diagnosis not present

## 2018-12-26 DIAGNOSIS — Z91013 Allergy to seafood: Secondary | ICD-10-CM | POA: Diagnosis not present

## 2018-12-26 DIAGNOSIS — G8929 Other chronic pain: Secondary | ICD-10-CM | POA: Diagnosis not present

## 2018-12-26 DIAGNOSIS — K219 Gastro-esophageal reflux disease without esophagitis: Secondary | ICD-10-CM | POA: Diagnosis not present

## 2018-12-26 DIAGNOSIS — M7541 Impingement syndrome of right shoulder: Secondary | ICD-10-CM | POA: Diagnosis not present

## 2018-12-26 DIAGNOSIS — F17210 Nicotine dependence, cigarettes, uncomplicated: Secondary | ICD-10-CM | POA: Diagnosis not present

## 2018-12-26 DIAGNOSIS — Z791 Long term (current) use of non-steroidal anti-inflammatories (NSAID): Secondary | ICD-10-CM | POA: Diagnosis not present

## 2018-12-26 DIAGNOSIS — M7581 Other shoulder lesions, right shoulder: Secondary | ICD-10-CM | POA: Diagnosis not present

## 2018-12-26 DIAGNOSIS — Z888 Allergy status to other drugs, medicaments and biological substances status: Secondary | ICD-10-CM | POA: Diagnosis not present

## 2018-12-26 DIAGNOSIS — M549 Dorsalgia, unspecified: Secondary | ICD-10-CM | POA: Diagnosis not present

## 2018-12-26 DIAGNOSIS — M19011 Primary osteoarthritis, right shoulder: Secondary | ICD-10-CM | POA: Diagnosis not present

## 2018-12-26 DIAGNOSIS — Z79899 Other long term (current) drug therapy: Secondary | ICD-10-CM | POA: Diagnosis not present

## 2018-12-26 DIAGNOSIS — Z833 Family history of diabetes mellitus: Secondary | ICD-10-CM | POA: Diagnosis not present

## 2018-12-26 DIAGNOSIS — Z7984 Long term (current) use of oral hypoglycemic drugs: Secondary | ICD-10-CM | POA: Diagnosis not present

## 2018-12-26 DIAGNOSIS — M75101 Unspecified rotator cuff tear or rupture of right shoulder, not specified as traumatic: Secondary | ICD-10-CM | POA: Diagnosis not present

## 2018-12-26 LAB — BASIC METABOLIC PANEL
Anion gap: 11 (ref 5–15)
BUN: 8 mg/dL (ref 8–23)
CO2: 20 mmol/L — ABNORMAL LOW (ref 22–32)
Calcium: 8.8 mg/dL — ABNORMAL LOW (ref 8.9–10.3)
Chloride: 108 mmol/L (ref 98–111)
Creatinine, Ser: 0.92 mg/dL (ref 0.61–1.24)
GFR calc Af Amer: >60 mL/min (ref >60)
GFR calc non Af Amer: >60 mL/min (ref >60)
Glucose, Bld: 175 mg/dL — ABNORMAL HIGH (ref 70–99)
Potassium: 3.9 mmol/L (ref 3.5–5.1)
Sodium: 139 mmol/L (ref 135–145)

## 2018-12-26 LAB — SARS CORONAVIRUS 2 (TAT 6-24 HRS): SARS Coronavirus 2: NEGATIVE

## 2018-12-26 NOTE — Progress Notes (Signed)
Gatorade 2 drink given with instructions to complete by 0815 dos, pt verbalized understanding. 

## 2018-12-27 DIAGNOSIS — S46011D Strain of muscle(s) and tendon(s) of the rotator cuff of right shoulder, subsequent encounter: Secondary | ICD-10-CM | POA: Diagnosis not present

## 2018-12-27 DIAGNOSIS — E119 Type 2 diabetes mellitus without complications: Secondary | ICD-10-CM | POA: Diagnosis not present

## 2018-12-27 DIAGNOSIS — M25511 Pain in right shoulder: Secondary | ICD-10-CM | POA: Diagnosis not present

## 2018-12-30 ENCOUNTER — Ambulatory Visit (HOSPITAL_BASED_OUTPATIENT_CLINIC_OR_DEPARTMENT_OTHER)
Admission: RE | Admit: 2018-12-30 | Discharge: 2018-12-30 | Disposition: A | Discharge status: Home / Self Care | Payer: Medicare Other | Attending: Orthopaedic Surgery | Admitting: Orthopaedic Surgery

## 2018-12-30 ENCOUNTER — Encounter (HOSPITAL_BASED_OUTPATIENT_CLINIC_OR_DEPARTMENT_OTHER): Payer: Self-pay

## 2018-12-30 ENCOUNTER — Encounter (HOSPITAL_BASED_OUTPATIENT_CLINIC_OR_DEPARTMENT_OTHER)
Admission: RE | Disposition: A | Discharge status: Home / Self Care | Payer: Self-pay | Source: Home / Self Care | Attending: Orthopaedic Surgery

## 2018-12-30 ENCOUNTER — Ambulatory Visit (HOSPITAL_BASED_OUTPATIENT_CLINIC_OR_DEPARTMENT_OTHER): Payer: Medicare Other | Admitting: Anesthesiology

## 2018-12-30 ENCOUNTER — Other Ambulatory Visit: Payer: Self-pay

## 2018-12-30 DIAGNOSIS — Z888 Allergy status to other drugs, medicaments and biological substances status: Secondary | ICD-10-CM | POA: Insufficient documentation

## 2018-12-30 DIAGNOSIS — Z791 Long term (current) use of non-steroidal anti-inflammatories (NSAID): Secondary | ICD-10-CM | POA: Diagnosis not present

## 2018-12-30 DIAGNOSIS — M7551 Bursitis of right shoulder: Secondary | ICD-10-CM | POA: Diagnosis not present

## 2018-12-30 DIAGNOSIS — R51 Headache: Secondary | ICD-10-CM | POA: Diagnosis not present

## 2018-12-30 DIAGNOSIS — M7581 Other shoulder lesions, right shoulder: Secondary | ICD-10-CM | POA: Insufficient documentation

## 2018-12-30 DIAGNOSIS — I10 Essential (primary) hypertension: Secondary | ICD-10-CM | POA: Diagnosis not present

## 2018-12-30 DIAGNOSIS — G8929 Other chronic pain: Secondary | ICD-10-CM | POA: Diagnosis not present

## 2018-12-30 DIAGNOSIS — Z8249 Family history of ischemic heart disease and other diseases of the circulatory system: Secondary | ICD-10-CM | POA: Insufficient documentation

## 2018-12-30 DIAGNOSIS — K219 Gastro-esophageal reflux disease without esophagitis: Secondary | ICD-10-CM | POA: Diagnosis not present

## 2018-12-30 DIAGNOSIS — M75101 Unspecified rotator cuff tear or rupture of right shoulder, not specified as traumatic: Secondary | ICD-10-CM | POA: Diagnosis not present

## 2018-12-30 DIAGNOSIS — Z79899 Other long term (current) drug therapy: Secondary | ICD-10-CM | POA: Insufficient documentation

## 2018-12-30 DIAGNOSIS — Z91013 Allergy to seafood: Secondary | ICD-10-CM | POA: Insufficient documentation

## 2018-12-30 DIAGNOSIS — G8918 Other acute postprocedural pain: Secondary | ICD-10-CM | POA: Diagnosis not present

## 2018-12-30 DIAGNOSIS — E119 Type 2 diabetes mellitus without complications: Secondary | ICD-10-CM | POA: Diagnosis not present

## 2018-12-30 DIAGNOSIS — Z833 Family history of diabetes mellitus: Secondary | ICD-10-CM | POA: Insufficient documentation

## 2018-12-30 DIAGNOSIS — M19011 Primary osteoarthritis, right shoulder: Secondary | ICD-10-CM | POA: Diagnosis not present

## 2018-12-30 DIAGNOSIS — Z7984 Long term (current) use of oral hypoglycemic drugs: Secondary | ICD-10-CM | POA: Diagnosis not present

## 2018-12-30 DIAGNOSIS — M549 Dorsalgia, unspecified: Secondary | ICD-10-CM | POA: Insufficient documentation

## 2018-12-30 DIAGNOSIS — M7541 Impingement syndrome of right shoulder: Secondary | ICD-10-CM | POA: Insufficient documentation

## 2018-12-30 DIAGNOSIS — S46011A Strain of muscle(s) and tendon(s) of the rotator cuff of right shoulder, initial encounter: Secondary | ICD-10-CM | POA: Diagnosis not present

## 2018-12-30 DIAGNOSIS — M24111 Other articular cartilage disorders, right shoulder: Secondary | ICD-10-CM | POA: Diagnosis not present

## 2018-12-30 DIAGNOSIS — F17210 Nicotine dependence, cigarettes, uncomplicated: Secondary | ICD-10-CM | POA: Insufficient documentation

## 2018-12-30 HISTORY — DX: Other chronic pain: G89.29

## 2018-12-30 HISTORY — PX: SHOULDER ARTHROSCOPY WITH ROTATOR CUFF REPAIR AND SUBACROMIAL DECOMPRESSION: SHX5686

## 2018-12-30 LAB — GLUCOSE, CAPILLARY
Glucose-Capillary: 112 mg/dL — ABNORMAL HIGH (ref 70–99)
Glucose-Capillary: 112 mg/dL — ABNORMAL HIGH (ref 70–99)

## 2018-12-30 SURGERY — SHOULDER ARTHROSCOPY WITH ROTATOR CUFF REPAIR AND SUBACROMIAL DECOMPRESSION
Anesthesia: General | Site: Shoulder | Laterality: Right

## 2018-12-30 MED ORDER — PROPOFOL 10 MG/ML IV BOLUS
INTRAVENOUS | Status: AC
  Filled 2018-12-30: qty 20

## 2018-12-30 MED ORDER — GLYCOPYRROLATE 0.2 MG/ML IJ SOLN
INTRAMUSCULAR | Status: DC | PRN
  Administered 2018-12-30 (×2): 0.1 mg via INTRAVENOUS

## 2018-12-30 MED ORDER — LIDOCAINE 2% (20 MG/ML) 5 ML SYRINGE
INTRAMUSCULAR | Status: AC
  Filled 2018-12-30: qty 5

## 2018-12-30 MED ORDER — FENTANYL CITRATE (PF) 100 MCG/2ML IJ SOLN
INTRAMUSCULAR | Status: DC | PRN
  Administered 2018-12-30 (×2): 50 ug via INTRAVENOUS

## 2018-12-30 MED ORDER — CELECOXIB 200 MG PO CAPS: 200.0000 mg | ORAL_CAPSULE | Freq: Two times a day (BID) | ORAL | 1 refills | Status: DC

## 2018-12-30 MED ORDER — CLONIDINE HCL (ANALGESIA) 100 MCG/ML EP SOLN
EPIDURAL | Status: DC | PRN
  Administered 2018-12-30: 100 ug

## 2018-12-30 MED ORDER — PHENYLEPHRINE 40 MCG/ML (10ML) SYRINGE FOR IV PUSH (FOR BLOOD PRESSURE SUPPORT)
PREFILLED_SYRINGE | INTRAVENOUS | Status: AC
  Filled 2018-12-30: qty 10

## 2018-12-30 MED ORDER — LACTATED RINGERS IV SOLN
INTRAVENOUS | Status: DC
  Administered 2018-12-30 (×2): via INTRAVENOUS

## 2018-12-30 MED ORDER — MIDAZOLAM HCL 2 MG/2ML IJ SOLN
INTRAMUSCULAR | Status: AC
Start: 2018-12-30 — End: ?
  Filled 2018-12-30: qty 2

## 2018-12-30 MED ORDER — ONDANSETRON HCL 4 MG PO TABS: 4.0000 mg | ORAL_TABLET | Freq: Three times a day (TID) | ORAL | 1 refills | Status: AC | PRN

## 2018-12-30 MED ORDER — FENTANYL CITRATE (PF) 100 MCG/2ML IJ SOLN
INTRAMUSCULAR | Status: AC
  Filled 2018-12-30: qty 2

## 2018-12-30 MED ORDER — ONDANSETRON HCL 4 MG/2ML IJ SOLN
INTRAMUSCULAR | Status: AC
  Filled 2018-12-30: qty 2

## 2018-12-30 MED ORDER — ROCURONIUM BROMIDE 10 MG/ML (PF) SYRINGE
PREFILLED_SYRINGE | INTRAVENOUS | Status: AC
  Filled 2018-12-30: qty 10

## 2018-12-30 MED ORDER — BUPIVACAINE-EPINEPHRINE (PF) 0.5% -1:200000 IJ SOLN
INTRAMUSCULAR | Status: DC | PRN
  Administered 2018-12-30: 20 mL via PERINEURAL

## 2018-12-30 MED ORDER — LIDOCAINE HCL (CARDIAC) PF 100 MG/5ML IV SOSY
PREFILLED_SYRINGE | INTRAVENOUS | Status: DC | PRN
  Administered 2018-12-30: 60 mg via INTRATRACHEAL

## 2018-12-30 MED ORDER — SUGAMMADEX SODIUM 200 MG/2ML IV SOLN
INTRAVENOUS | Status: DC | PRN
  Administered 2018-12-30: 200 mg via INTRAVENOUS

## 2018-12-30 MED ORDER — ACETAMINOPHEN 325 MG PO TABS: 650.0000 mg | ORAL_TABLET | Freq: Three times a day (TID) | ORAL | 2 refills | Status: DC

## 2018-12-30 MED ORDER — EPHEDRINE SULFATE 50 MG/ML IJ SOLN
INTRAMUSCULAR | Status: DC | PRN
  Administered 2018-12-30: 10 mg via INTRAVENOUS

## 2018-12-30 MED ORDER — CHLORHEXIDINE GLUCONATE 4 % EX LIQD: 60.0000 mL | Freq: Once | CUTANEOUS | Status: DC

## 2018-12-30 MED ORDER — MIDAZOLAM HCL 2 MG/2ML IJ SOLN
1.0000 mg | INTRAMUSCULAR | Status: DC | PRN
  Administered 2018-12-30: 2 mg via INTRAVENOUS

## 2018-12-30 MED ORDER — ONDANSETRON HCL 4 MG/2ML IJ SOLN
INTRAMUSCULAR | Status: DC | PRN
  Administered 2018-12-30: 4 mg via INTRAVENOUS

## 2018-12-30 MED ORDER — ACETAMINOPHEN 325 MG PO TABS: 325.0000 mg | ORAL_TABLET | ORAL | Status: DC | PRN

## 2018-12-30 MED ORDER — ROCURONIUM BROMIDE 100 MG/10ML IV SOLN
INTRAVENOUS | Status: DC | PRN
  Administered 2018-12-30: 20 mg via INTRAVENOUS
  Administered 2018-12-30: 50 mg via INTRAVENOUS

## 2018-12-30 MED ORDER — EPHEDRINE 5 MG/ML INJ
INTRAVENOUS | Status: AC
  Filled 2018-12-30: qty 10

## 2018-12-30 MED ORDER — PROPOFOL 10 MG/ML IV BOLUS
INTRAVENOUS | Status: DC | PRN
  Administered 2018-12-30: 200 mg via INTRAVENOUS

## 2018-12-30 MED ORDER — PHENYLEPHRINE HCL (PRESSORS) 10 MG/ML IV SOLN
INTRAVENOUS | Status: DC | PRN
  Administered 2018-12-30: 120 ug via INTRAVENOUS
  Administered 2018-12-30: 80 ug via INTRAVENOUS
  Administered 2018-12-30: 120 ug via INTRAVENOUS
  Administered 2018-12-30: 80 ug via INTRAVENOUS
  Administered 2018-12-30: 120 ug via INTRAVENOUS
  Administered 2018-12-30: 80 ug via INTRAVENOUS

## 2018-12-30 MED ORDER — OXYCODONE HCL 5 MG PO TABS: ORAL_TABLET | ORAL | 0 refills | Status: AC

## 2018-12-30 MED ORDER — GLYCOPYRROLATE PF 0.2 MG/ML IJ SOSY
PREFILLED_SYRINGE | INTRAMUSCULAR | Status: AC
  Filled 2018-12-30: qty 1

## 2018-12-30 MED ORDER — MIDAZOLAM HCL 2 MG/2ML IJ SOLN
INTRAMUSCULAR | Status: AC
  Filled 2018-12-30: qty 2

## 2018-12-30 MED ORDER — FENTANYL CITRATE (PF) 100 MCG/2ML IJ SOLN: 25.0000 ug | INTRAMUSCULAR | Status: DC | PRN

## 2018-12-30 MED ORDER — MEPERIDINE HCL 25 MG/ML IJ SOLN: 6.2500 mg | INTRAMUSCULAR | Status: DC | PRN

## 2018-12-30 MED ORDER — CEFAZOLIN SODIUM-DEXTROSE 2-4 GM/100ML-% IV SOLN
2.0000 g | INTRAVENOUS | Status: AC
  Administered 2018-12-30: 2 g via INTRAVENOUS

## 2018-12-30 MED ORDER — SODIUM CHLORIDE 0.9 % IR SOLN
Status: DC | PRN
  Administered 2018-12-30: 2 mL

## 2018-12-30 MED ORDER — SODIUM CHLORIDE 0.9 % IR SOLN
Status: DC | PRN
  Administered 2018-12-30: 6000 mL

## 2018-12-30 MED ORDER — OXYCODONE HCL 5 MG/5ML PO SOLN: 5.0000 mg | Freq: Once | ORAL | Status: DC | PRN

## 2018-12-30 MED ORDER — DEXAMETHASONE SODIUM PHOSPHATE 10 MG/ML IJ SOLN
INTRAMUSCULAR | Status: DC | PRN
  Administered 2018-12-30: 5 mg via INTRAVENOUS

## 2018-12-30 MED ORDER — CEFAZOLIN SODIUM-DEXTROSE 2-4 GM/100ML-% IV SOLN
INTRAVENOUS | Status: AC
Start: 2018-12-30 — End: ?
  Filled 2018-12-30: qty 100

## 2018-12-30 MED ORDER — MIDAZOLAM HCL 2 MG/2ML IJ SOLN
INTRAMUSCULAR | Status: DC | PRN
  Administered 2018-12-30: 2 mg via INTRAVENOUS

## 2018-12-30 MED ORDER — DEXAMETHASONE SODIUM PHOSPHATE 10 MG/ML IJ SOLN
INTRAMUSCULAR | Status: AC
  Filled 2018-12-30: qty 1

## 2018-12-30 MED ORDER — SCOPOLAMINE 1 MG/3DAYS TD PT72: 1.0000 | MEDICATED_PATCH | Freq: Once | TRANSDERMAL | Status: DC

## 2018-12-30 MED ORDER — ACETAMINOPHEN 160 MG/5ML PO SOLN: 325.0000 mg | ORAL | Status: DC | PRN

## 2018-12-30 MED ORDER — FENTANYL CITRATE (PF) 100 MCG/2ML IJ SOLN
50.0000 ug | INTRAMUSCULAR | Status: DC | PRN
  Administered 2018-12-30: 100 ug via INTRAVENOUS

## 2018-12-30 MED ORDER — ONDANSETRON HCL 4 MG/2ML IJ SOLN: 4.0000 mg | Freq: Once | INTRAMUSCULAR | Status: DC | PRN

## 2018-12-30 MED ORDER — FENTANYL CITRATE (PF) 100 MCG/2ML IJ SOLN: 50.0000 ug | INTRAMUSCULAR | Status: DC | PRN

## 2018-12-30 MED ORDER — BUPIVACAINE LIPOSOME 1.3 % IJ SUSP
INTRAMUSCULAR | Status: DC | PRN
  Administered 2018-12-30: 10 mL via PERINEURAL

## 2018-12-30 MED ORDER — OXYCODONE HCL 5 MG PO TABS: 5.0000 mg | ORAL_TABLET | Freq: Once | ORAL | Status: DC | PRN

## 2018-12-30 SURGICAL SUPPLY — 86 items
AID PSTN UNV HD RSTRNT DISP (MISCELLANEOUS) ×1
ANCH SUT SWLK 19.1X4.75 (Anchor) ×1 IMPLANT
ANCHOR SUT BIO SW 4.75X19.1 (Anchor) ×2 IMPLANT
APL PRP STRL LF DISP 70% ISPRP (MISCELLANEOUS) ×1
BLADE EXCALIBUR 4.0MM X 13CM (MISCELLANEOUS) ×1
BLADE EXCALIBUR 4.0X13 (MISCELLANEOUS) ×2 IMPLANT
BLADE SHAVER BONE 5.0MM X 13CM (MISCELLANEOUS)
BLADE SHAVER BONE 5.0X13 (MISCELLANEOUS) IMPLANT
BNDG COHESIVE 4X5 TAN STRL (GAUZE/BANDAGES/DRESSINGS) ×2 IMPLANT
BURR OVAL 8 FLU 4.0MM X 13CM (MISCELLANEOUS) ×1
BURR OVAL 8 FLU 4.0X13 (MISCELLANEOUS) ×1 IMPLANT
CANNULA 5.75X71 LONG (CANNULA) IMPLANT
CANNULA PASSPORT 5 (CANNULA) IMPLANT
CANNULA PASSPORT 5CM (CANNULA)
CANNULA PASSPORT BUTTON (MISCELLANEOUS) IMPLANT
CANNULA PASSPORT BUTTON 10-40 (CANNULA) IMPLANT
CANNULA TWIST IN 8.25X7CM (CANNULA) IMPLANT
CHLORAPREP W/TINT 26 (MISCELLANEOUS) ×3 IMPLANT
CLOSURE WOUND 1/2 X4 (GAUZE/BANDAGES/DRESSINGS)
COVER WAND RF STERILE (DRAPES) IMPLANT
DECANTER SPIKE VIAL GLASS SM (MISCELLANEOUS) IMPLANT
DISSECTOR 3.5MM X 13CM CVD (MISCELLANEOUS) IMPLANT
DISSECTOR 4.0MMX13CM CVD (MISCELLANEOUS) IMPLANT
DRAPE HALF SHEET 70X43 (DRAPES) ×3 IMPLANT
DRAPE IMP U-DRAPE 54X76 (DRAPES) ×3 IMPLANT
DRAPE INCISE IOBAN 66X45 STRL (DRAPES) ×2 IMPLANT
DRAPE SHOULDER BEACH CHAIR (DRAPES) ×3 IMPLANT
DRSG PAD ABDOMINAL 8X10 ST (GAUZE/BANDAGES/DRESSINGS) ×3 IMPLANT
DW OUTFLOW CASSETTE/TUBE SET (MISCELLANEOUS) ×3 IMPLANT
ELECT NDL TIP 2.8 STRL (NEEDLE) IMPLANT
ELECT NEEDLE TIP 2.8 STRL (NEEDLE) IMPLANT
ELECT REM PT RETURN 9FT ADLT (ELECTROSURGICAL) ×3
ELECTRODE REM PT RTRN 9FT ADLT (ELECTROSURGICAL) ×1 IMPLANT
GAUZE SPONGE 4X4 12PLY STRL (GAUZE/BANDAGES/DRESSINGS) ×3 IMPLANT
GAUZE XEROFORM 1X8 LF (GAUZE/BANDAGES/DRESSINGS) IMPLANT
GLOVE BIO SURGEON STRL SZ8 (GLOVE) ×4 IMPLANT
GLOVE BIOGEL M 7.0 STRL (GLOVE) ×2 IMPLANT
GLOVE BIOGEL PI IND STRL 7.0 (GLOVE) IMPLANT
GLOVE BIOGEL PI IND STRL 8 (GLOVE) ×1 IMPLANT
GLOVE BIOGEL PI INDICATOR 7.0 (GLOVE) ×2
GLOVE BIOGEL PI INDICATOR 8 (GLOVE) ×4
GLOVE ECLIPSE 8.0 STRL XLNG CF (GLOVE) ×3 IMPLANT
GOWN STRL REUS W/ TWL LRG LVL3 (GOWN DISPOSABLE) ×1 IMPLANT
GOWN STRL REUS W/TWL LRG LVL3 (GOWN DISPOSABLE) ×3
GOWN STRL REUS W/TWL XL LVL3 (GOWN DISPOSABLE) ×5 IMPLANT
IMPL SPEEDBRIDGE KIT (Orthopedic Implant) IMPLANT
IMPLANT SPEEDBRIDGE KIT (Orthopedic Implant) ×3 IMPLANT
KIT STABILIZATION SHOULDER (MISCELLANEOUS) ×3 IMPLANT
KIT STR SPEAR 1.8 FBRTK DISP (KITS) IMPLANT
LASSO 90 CVE QUICKPAS (DISPOSABLE) IMPLANT
MANIFOLD NEPTUNE II (INSTRUMENTS) ×3 IMPLANT
NDL SAFETY ECLIPSE 18X1.5 (NEEDLE) ×1 IMPLANT
NDL SCORPION MULTI FIRE (NEEDLE) IMPLANT
NDL SUT 6 .5 CRC .975X.05 MAYO (NEEDLE) IMPLANT
NEEDLE HYPO 18GX1.5 SHARP (NEEDLE) ×3
NEEDLE MAYO TAPER (NEEDLE)
NEEDLE SCORPION MULTI FIRE (NEEDLE) IMPLANT
PACK ARTHROSCOPY DSU (CUSTOM PROCEDURE TRAY) ×3 IMPLANT
PACK BASIN DAY SURGERY FS (CUSTOM PROCEDURE TRAY) ×3 IMPLANT
PASSPORT BUTTON CANNULA (MISCELLANEOUS) ×3
PENCIL BUTTON HOLSTER BLD 10FT (ELECTRODE) IMPLANT
PORT APPOLLO RF 90DEGREE MULTI (SURGICAL WAND) ×3 IMPLANT
RESTRAINT HEAD UNIVERSAL NS (MISCELLANEOUS) ×3 IMPLANT
SLEEVE SCD COMPRESS KNEE MED (MISCELLANEOUS) ×3 IMPLANT
SLING ARM FOAM STRAP LRG (SOFTGOODS) IMPLANT
SLING ARM IMMOBILIZER LRG (SOFTGOODS) IMPLANT
SLING ARM IMMOBILIZER MED (SOFTGOODS) IMPLANT
SLING ARM MED ADULT FOAM STRAP (SOFTGOODS) IMPLANT
SLING ARM XL FOAM STRAP (SOFTGOODS) IMPLANT
SPONGE LAP 4X18 RFD (DISPOSABLE) IMPLANT
STRIP CLOSURE SKIN 1/2X4 (GAUZE/BANDAGES/DRESSINGS) IMPLANT
SUCTION FRAZIER HANDLE 10FR (MISCELLANEOUS)
SUCTION TUBE FRAZIER 10FR DISP (MISCELLANEOUS) IMPLANT
SUT FIBERWIRE #2 38 T-5 BLUE (SUTURE)
SUT MNCRL AB 4-0 PS2 18 (SUTURE) ×3 IMPLANT
SUT PDS AB 1 CT  36 (SUTURE)
SUT PDS AB 1 CT 36 (SUTURE) IMPLANT
SUT TIGER TAPE 7 IN WHITE (SUTURE) IMPLANT
SUTURE FIBERWR #2 38 T-5 BLUE (SUTURE) IMPLANT
SUTURE TAPE TIGERLINK 1.3MM BL (SUTURE) IMPLANT
SUTURETAPE TIGERLINK 1.3MM BL (SUTURE)
SYR 5ML LUER SLIP (SYRINGE) ×3 IMPLANT
TAPE FIBER 2MM 7IN #2 BLUE (SUTURE) ×2 IMPLANT
TOWEL GREEN STERILE FF (TOWEL DISPOSABLE) ×3 IMPLANT
TUBE SUCTION HIGH CAP CLEAR NV (SUCTIONS) IMPLANT
TUBING ARTHROSCOPY IRRIG 16FT (MISCELLANEOUS) ×3 IMPLANT

## 2018-12-30 NOTE — Anesthesia Preprocedure Evaluation (Addendum)
Anesthesia Evaluation  Patient identified by MRN, date of birth, ID band Patient awake    Reviewed: Allergy & Precautions, H&P , NPO status , Patient's Chart, lab work & pertinent test results, reviewed documented beta blocker date and time   Airway Mallampati: I  TM Distance: >3 FB Neck ROM: Full    Dental no notable dental hx. (+) Edentulous Upper, Dental Advisory Given, Missing   Pulmonary neg pulmonary ROS, Current Smoker,    Pulmonary exam normal breath sounds clear to auscultation       Cardiovascular Exercise Tolerance: Good hypertension, Pt. on medications negative cardio ROS   Rhythm:regular Rate:Normal     Neuro/Psych negative neurological ROS  negative psych ROS   GI/Hepatic negative GI ROS, Neg liver ROS, GERD  Medicated and Controlled,  Endo/Other  negative endocrine ROSdiabetes, Type 2, Oral Hypoglycemic Agents  Renal/GU negative Renal ROS  negative genitourinary   Musculoskeletal   Abdominal   Peds  Hematology negative hematology ROS (+)   Anesthesia Other Findings   Reproductive/Obstetrics negative OB ROS                             Anesthesia Physical  Anesthesia Plan  ASA: III  Anesthesia Plan: General   Post-op Pain Management: GA combined w/ Regional for post-op pain   Induction: Intravenous  PONV Risk Score and Plan: 2 and Ondansetron and Treatment may vary due to age or medical condition  Airway Management Planned: Oral ETT and LMA  Additional Equipment:   Intra-op Plan:   Post-operative Plan: Extubation in OR  Informed Consent: I have reviewed the patients History and Physical, chart, labs and discussed the procedure including the risks, benefits and alternatives for the proposed anesthesia with the patient or authorized representative who has indicated his/her understanding and acceptance.     Dental advisory given  Plan Discussed with: CRNA,  Surgeon and Anesthesiologist  Anesthesia Plan Comments: (Discussed both nerve block for pain relief post-op and GA; including NV, sore throat, dental injury, and pulmonary complications)        Anesthesia Quick Evaluation  

## 2018-12-30 NOTE — Anesthesia Procedure Notes (Addendum)
Anesthesia Regional Block: Interscalene brachial plexus block   Pre-Anesthetic Checklist: ,, timeout performed, Correct Patient, Correct Site, Correct Laterality, Correct Procedure, Correct Position, site marked, Risks and benefits discussed,  Surgical consent,  Pre-op evaluation,  At surgeon's request and post-op pain management  Laterality: Left  Prep: chloraprep       Needles:  Injection technique: Single-shot  Needle Type: Echogenic Stimulator Needle     Needle Length: 5cm  Needle Gauge: 22     Additional Needles:   Procedures:, nerve stimulator,,, ultrasound used (permanent image in chart),,,,   Nerve Stimulator or Paresthesia:  Response: hand, 0.45 mA,   Additional Responses:   Narrative:  Start time: 12/30/2018 8:53 AM End time: 12/30/2018 9:00 AM Injection made incrementally with aspirations every 5 mL.  Performed by: Personally  Anesthesiologist: Janeece Riggers, MD  Additional Notes: Functioning IV was confirmed and monitors were applied.  A 42mm 22ga Arrow echogenic stimulator needle was used. Sterile prep and drape,hand hygiene and sterile gloves were used. Ultrasound guidance: relevant anatomy identified, needle position confirmed, local anesthetic spread visualized around nerve(s)., vascular puncture avoided.  Image printed for medical record. Negative aspiration and negative test dose prior to incremental administration of local anesthetic. The patient tolerated the procedure well.

## 2018-12-30 NOTE — Anesthesia Postprocedure Evaluation (Signed)
Anesthesia Post Note  Patient: Riley Clarke  Procedure(s) Performed: SHOULDER ARTHROSCOPY WITH ROTATOR CUFF REPAIR AND SUBACROMIAL DECOMPRESSION, DISTAL CLAVICLE EXICISION, (Right Shoulder)     Patient location during evaluation: PACU Anesthesia Type: General Level of consciousness: awake and alert Pain management: pain level controlled Vital Signs Assessment: post-procedure vital signs reviewed and stable Respiratory status: spontaneous breathing, nonlabored ventilation, respiratory function stable and patient connected to nasal cannula oxygen Cardiovascular status: blood pressure returned to baseline and stable Postop Assessment: no apparent nausea or vomiting Anesthetic complications: no    Last Vitals:  Vitals:   12/30/18 1314 12/30/18 1325  BP: 138/86 127/79  Pulse: 85 78  Resp: (!) 21 20  Temp:  36.7 C  SpO2: 96% 97%    Last Pain:  Vitals:   12/30/18 1325  TempSrc: Oral  PainSc: 0-No pain                 Aniyla Harling

## 2018-12-30 NOTE — Op Note (Addendum)
Orthopaedic Surgery Operative Note (CSN: [id number])  Maurie Olesen Cronce  June 03, 1956 Date of Surgery: 12/30/2018   Diagnoses:  Right shoulder recurrent massive rotator cuff tear, biceps pathology, recurrent impingement and acromioclavicular pain.  Procedure: Right shoulder arthroscopy with revision rotator cuff repair Right arthroscopic distal clavicle excision Right shoulder arthroscopic extensive debridement Right shoulder arthroscopic revision subacromial decompression Arthroscopic removal of hardware.   Operative Finding Exam under anesthesia: Motion was full, no limitation no instability Articular space: No loose bodies, capsule intact, labrum intact Chondral surfaces:Intact, no sign of chondral degeneration on the glenoid or humeral head Biceps: Significant fraying of the biceps itself with the substance of it being weak and not amenable to tenodesis. Subscapularis: Intact Superior Cuff: Large recurrent tear of the supraspinatus with clear cut through of the sutures from the previous 1 row repair.  There is poor tissue quality throughout this area that we are able to reapproximate tissue to bone by medializing footprint.  3x2 anchor configuration Bursal side: Small remainder subacromial spur anteriorly and medially.  Revision distal clavicle performed.  Successful completion of the planned procedure.   Patient's tissue quality was moderate to poor obvious loss of tissue due to cut through his previous sutures.  We had 3 medial anchors and tied a medial row and were able to approximate bone and tendon the we are not able to cover the footprint in its entirety.  It was a relatively medialized repair.  Due to the patient's diabetes and chronic pain medication use as well as smoking we felt that a superior capsular reconstruction was not in his best interest as it would have a higher likelihood of failure.  Preserving his own tissues is likely the better option.  We did mobilize the tissue  extensively as initially had trouble and tension on pulling it to the level of the tuberosity.  The patient fails the surgery he will be a candidate for reverse total shoulder arthroplasty and less likely a good candidate for superior capsular reconstruction as this would have been his third procedure.   Post-operative plan: The patient will be nonweightbearing in a sling for 6 weeks.  The patient will be discharged home.  DVT prophylaxis not indicated in ambulatory upper extremity patient without known risk factors.   Pain control with PRN pain medication preferring oral medicines.  Follow up plan will be scheduled in approximately 7 days for incision check and XR.  Patient has a history of chronic pain medication use and we have prescribed him our standard oxycodone 5 mg tablets #30 to take as breakthrough medicine in addition to his baseline 10 mg Percocets which he uses on a schedule IV times a day.  Post-Op Diagnosis: Same Surgeons:Primary: Hiram Gash, MD Assistants: Joya Gaskins, OPAC Location: [facility] OR ROOM 6 Anesthesia: General with Exparel interscalene block Antibiotics: Ancef 2g preop Tourniquet time: * No tourniquets in log * Estimated Blood Loss: Minimal Complications: None Specimens: None Implants: Implant Name Type Inv. Item Serial No. Manufacturer Lot No. LRB No. Used Action  IMPLANT SPEEDBRIDGE KIT - [id number] Orthopedic Implant IMPLANT SPEEDBRIDGE KIT  [place] [id number] Right 1 Implanted  ANCHOR SUT BIO SW 4.75X19.1 - [id number] Anchor ANCHOR SUT BIO SW 4.75X19.1  Rolinda Roan [id number] Right 1 Implanted    Indications for Surgery:   GEORG ANG is a 63 y.o. male with continued shoulder pain refractory to nonoperative measures for extended period of time.    The risks and benefits were explained at length including but not limited  to continued pain, cuff failure, biceps tenodesis failure, stiffness, need for further surgery and infection.  We discussed at length the  rate of rerupture as well as the need for possible arthroplasty options in the future.  We did not feel that this was in his best interest due to his young age and relatively preserved joint.  He understood and wanted to proceed with a surgery that would preserve his own anatomy as long as possible.   Procedure:   Patient was correctly identified in the preoperative holding area and operative site marked.  Patient brought to OR and positioned beachchair on an Stollings table ensuring that all bony prominences were padded and the head was in an appropriate location.  Anesthesia was induced and the operative shoulder was prepped and draped in the usual sterile fashion.  Timeout was called preincision.  A standard posterior viewing portal was made after localizing the portal with a spinal needle.  An anterior accessory portal was also made.  After clearing the articular space the camera was positioned in the subacromial space.  Findings above.  Began with extensive debridement of the remainder cuff tissue and removal of the previously placed hardware in the form of 2 fiber tape sutures which are clearly pulled through.  The wound was still present on each anchor.  Extensive debridement was performed of the biceps stump and a biceps tenotomy was performed.  We removed bursal tissue as well.  This point we turned our attention to the subacromial space.  A subacromial decompression revision was performed with a bur after clearing soft tissue.  This was flat when viewing from the posterior portal using the instruments as a template.  There is no residual hook.  Coracoacromial ligament was released.  Revision distal clavicle excision was performed.  We cleared the soft tissue around the distal clavicle and noted that there was a small amount of bone that was still left superiorly and posteriorly.  We able to clear this without issue and there is clearly at least 10 mm was placed after we used the bur to resect this  bone.  This point we identified the rotator cuff.  We already remove the previously placed anchors.  The entire supraspinatus and most of the infraspinatus was involved in this tear.  Retracted to the level of the glenoid however we were able to mobilize the tissue to the previous footprint medial border without significant tension.  We did feel that preserving on tissue would be in the patient's best interest and thus we used a bur to medialize the foot pegs about 3 to 4 mm.  This led [place] to proceed with a repair with less overall tension.  At this point we placed 3 medial swivel locks all loaded with tape and FiberWire.  All limbs were passed as typical with a scorpion as well as with a penetrator device.  This point the fiber wires and cells were tied and the fiber tapes were then brought over to lateral row anchors which were placed about 10 to 12 mm below the tuberosity to avoid the previously placed anchors.  We had good approximation of the tendon with minimal tension.  There was a small area footprint anteriorly that was uncovered however we were able to get tendon to bone at the anterior part of the supraspinatus footprint.  The incisions were closed with absorbable monocryl and steri strips.  A sterile dressing was placed along with a sling. The patient was awoken from general anesthesia and  taken to the PACU in stable condition without complication.   Joya Gaskins, OPA-C, present and scrubbed throughout the case, critical for completion in a timely fashion, and for retraction, instrumentation, closure.

## 2018-12-30 NOTE — Progress Notes (Signed)
Assisted Dr. Oddono with right, ultrasound guided, interscalene  block. Side rails up, monitors on throughout procedure. See vital signs in flow sheet. Tolerated Procedure well. 

## 2018-12-30 NOTE — Anesthesia Procedure Notes (Signed)
Procedure Name: Intubation Date/Time: 12/30/2018 10:15 AM Performed by: Janeece Riggers, MD Pre-anesthesia Checklist: Patient identified, Emergency Drugs available, Suction available and Patient being monitored Patient Re-evaluated:Patient Re-evaluated prior to induction Oxygen Delivery Method: Circle system utilized Preoxygenation: Pre-oxygenation with 100% oxygen Induction Type: IV induction Ventilation: Mask ventilation without difficulty Laryngoscope Size: Mac and 3 Grade View: Grade I Tube type: Oral Tube size: 7.0 mm Number of attempts: 1 Airway Equipment and Method: Stylet Placement Confirmation: ETT inserted through vocal cords under direct vision,  positive ETCO2 and breath sounds checked- equal and bilateral Secured at: 23 cm Tube secured with: Tape Dental Injury: Teeth and Oropharynx as per pre-operative assessment

## 2018-12-30 NOTE — Transfer of Care (Signed)
Immediate Anesthesia Transfer of Care Note  Patient: Riley Clarke  Procedure(s) Performed: SHOULDER ARTHROSCOPY WITH ROTATOR CUFF REPAIR AND SUBACROMIAL DECOMPRESSION, DISTAL CLAVICLE EXICISION, (Right Shoulder)  Patient Location: PACU  Anesthesia Type:General  Level of Consciousness: awake, alert  and patient cooperative  Airway & Oxygen Therapy: Patient Spontanous Breathing and Patient connected to face mask oxygen  Post-op Assessment: Report given to RN, Post -op Vital signs reviewed and stable and Patient moving all extremities X 4  Post vital signs: Reviewed and stable  Last Vitals:  Vitals Value Taken Time  BP 142/89 12/30/18 1232  Temp    Pulse 99 12/30/18 1235  Resp 15 12/30/18 1235  SpO2 100 % 12/30/18 1235  Vitals shown include unvalidated device data.  Last Pain:  Vitals:   12/30/18 0820  TempSrc: Oral  PainSc: 0-No pain         Complications: No apparent anesthesia complications

## 2018-12-30 NOTE — Discharge Instructions (Signed)

## 2018-12-30 NOTE — H&P (Signed)
PREOPERATIVE H&P  Chief Complaint: RIGHT SHOULDER OSTEOARTHRITIS, ROTATOR CUFF TEAR  HPI: Riley Clarke is a 63 y.o. male who presents for preoperative history and physical with a diagnosis of RIGHT SHOULDER OSTEOARTHRITIS, ROTATOR CUFF TEAR. Symptoms are rated as moderate to severe, and have been worsening.  This is significantly impairing activities of daily living.  Please see my clinic note for full details on this patient's care.  He has elected for surgical management.   Past Medical History:  Diagnosis Date  . Arthritis   . Chronic back pain    on chronic opioids  . Diabetes mellitus without complication ([place])   . GERD (gastroesophageal reflux disease)   . Headache(784.0)   . Hypertension    no meds   Past Surgical History:  Procedure Laterality Date  . BACK SURGERY    . CERVICAL DISC SURGERY Left 99  . ROTATOR CUFF REPAIR Bilateral    Social History   Socioeconomic History  . Marital status: Single    Spouse name: Not on file  . Number of children: Not on file  . Years of education: Not on file  . Highest education level: Not on file  Occupational History  . Not on file  Social Needs  . Financial resource strain: Not on file  . Food insecurity    Worry: Not on file    Inability: Not on file  . Transportation needs    Medical: Not on file    Non-medical: Not on file  Tobacco Use  . Smoking status: Current Some Day Smoker    Packs/day: 0.00    Years: 25.00    Pack years: 0.00    Types: Cigarettes  . Smokeless tobacco: Never Used  . Tobacco comment: rarely 2-3 cigs/week  Substance and Sexual Activity  . Alcohol use: Yes    Alcohol/week: 4.0 standard drinks    Types: 4 Cans of beer per week    Comment: twice a week  . Drug use: No  . Sexual activity: Not on file  Lifestyle  . Physical activity    Days per week: Not on file    Minutes per session: Not on file  . Stress: Not on file  Relationships  . Social Herbalist on phone: Not on  file    Gets together: Not on file    Attends religious service: Not on file    Active member of club or organization: Not on file    Attends meetings of clubs or organizations: Not on file    Relationship status: Not on file  Other Topics Concern  . Not on file  Social History Narrative  . Not on file   Family History  Problem Relation Age of Onset  . Heart disease Mother   . Diabetes Mother   . Amblyopia Neg Hx   . Blindness Neg Hx   . Cataracts Neg Hx   . Hypertension Neg Hx   . Macular degeneration Neg Hx   . Retinal detachment Neg Hx   . Strabismus Neg Hx    Allergies  Allergen Reactions  . Shrimp [Shellfish Allergy]     intolerant  . Valium [Diazepam] Itching   Prior to Admission medications   Medication Sig Start Date End Date Taking? Authorizing Provider  Dulaglutide (TRULICITY) 1.5 TY/6.0AY SOPN INJECT 0.5 MILLILITERS INTO THE SKIN ONCE A WEEK 07/22/18  Yes Azzie Glatter, FNP  esomeprazole (NEXIUM) 40 MG capsule TAKE ONE (1) CAPSULE BY MOUTH EVERY MORNING  BEFORE BREAKFAST 06/28/18  Yes Azzie Glatter, FNP  gabapentin (NEURONTIN) 300 MG capsule TAKE 1 CAPSULE BY MOUTH THREE TIMES A DAY 02/05/18  Yes Azzie Glatter, FNP  JARDIANCE 25 MG TABS tablet TAKE ONE (1) TABLET BY MOUTH EVERY DAY 12/10/17  Yes Dorena Dew, FNP  linagliptin (TRADJENTA) 5 MG TABS tablet Take 1 tablet (5 mg total) by mouth daily. 04/22/18  Yes Azzie Glatter, FNP  meloxicam (MOBIC) 15 MG tablet TAKE ONE (1) TABLET BY MOUTH EVERY DAY 04/26/18  Yes Azzie Glatter, FNP  metFORMIN (GLUCOPHAGE) 1000 MG tablet TAKE 1 TABLET BY MOUTH 2 TIMES DAILY WITH A MEAL. 06/28/18  Yes Azzie Glatter, FNP  oxyCODONE-acetaminophen (PERCOCET) 10-325 MG tablet Take 1 tablet by mouth every 4 (four) hours as needed for pain.   Yes [provider]  Vitamin D, Ergocalciferol, (DRISDOL) 50000 UNITS CAPS capsule Take 50,000 Units by mouth every 7 (seven) days. Reported on 07/05/2015   Yes [provider]  glucose blood (ACCU-CHEK AVIVA) test strip Use as instructed 04/05/15   Micheline Chapman, NP  [facility] 4 MG/0.1ML LIQD nasal spray kit  04/08/18   [provider]     Positive ROS: All other systems have been reviewed and were otherwise negative with the exception of those mentioned in the HPI and as above.  Physical Exam: General: Alert, no acute distress Cardiovascular: No pedal edema Respiratory: No cyanosis, no use of accessory musculature GI: No organomegaly, abdomen is soft and non-tender Skin: No lesions in the area of chief complaint Neurologic: Sensation intact distally Psychiatric: Patient is competent for consent with normal mood and affect Lymphatic: No axillary or cervical lymphadenopathy  MUSCULOSKELETAL: R shoulder pain with cuff testing, ROM relatively preserved.  Assessment: RIGHT SHOULDER OSTEOARTHRITIS, ROTATOR CUFF TEAR  Plan: Plan for Procedure(s): SHOULDER ARTHROSCOPY WITH ROTATOR CUFF REPAIR AND SUBACROMIAL DECOMPRESSION, DISTAL CLAVICLE EXICISION, POSSIBLE BICEPS TENODESIS  Possible SCR vs debridement alone.  Discussed that nature of shoulder and tendon would determine the outcome on surgical decisino, prepared for SCR.  A1c 7.1 on Friday.  The risks benefits and alternatives were discussed with the patient including but not limited to the risks of nonoperative treatment, versus surgical intervention including infection, bleeding, nerve injury,  blood clots, cardiopulmonary complications, morbidity, mortality, among others, and they were willing to proceed.   Hiram Gash, MD  12/30/2018 10:02 AM

## 2018-12-31 ENCOUNTER — Encounter (HOSPITAL_BASED_OUTPATIENT_CLINIC_OR_DEPARTMENT_OTHER): Payer: Self-pay | Admitting: Orthopaedic Surgery

## 2018-12-31 DIAGNOSIS — S46011D Strain of muscle(s) and tendon(s) of the rotator cuff of right shoulder, subsequent encounter: Secondary | ICD-10-CM | POA: Diagnosis not present

## 2019-01-01 NOTE — Addendum Note (Signed)
Addendum  created 01/01/19 0809 by Marsi Turvey, Ernesta Amble, CRNA   Charge Capture section accepted

## 2019-01-07 DIAGNOSIS — S46011D Strain of muscle(s) and tendon(s) of the rotator cuff of right shoulder, subsequent encounter: Secondary | ICD-10-CM | POA: Diagnosis not present

## 2019-01-15 ENCOUNTER — Ambulatory Visit (INDEPENDENT_AMBULATORY_CARE_PROVIDER_SITE_OTHER): Payer: Medicare Other | Admitting: Podiatry

## 2019-01-15 ENCOUNTER — Other Ambulatory Visit: Payer: Self-pay

## 2019-01-15 ENCOUNTER — Encounter: Payer: Self-pay | Admitting: Podiatry

## 2019-01-15 VITALS — Temp 97.2°F

## 2019-01-15 DIAGNOSIS — M722 Plantar fascial fibromatosis: Secondary | ICD-10-CM | POA: Diagnosis not present

## 2019-01-15 NOTE — Progress Notes (Signed)
Subjective:   Patient ID: Riley Clarke , male   DOB: 63 y.o.   MRN: [id number]   HPI Patient presents with exquisite discomfort plantar aspect both heels stated that did really well for a few months and is just had elbow and shoulder surgery right   ROS      Objective:  Physical Exam  Neurovascular status intact with exquisite discomfort plantar aspect heel medial side bilateral at insertion calcaneus     Assessment:  Chronic plantar fasciitis bilateral     Plan:  Sterile prep reinjected the fascia 3 mg Kenalog 5 mg Xylocaine and continue conservative treatment with reduced activity and reappoint to recheck as needed understanding this is the best treatment for him due to his overall health structure and that surgery is not a good option currently for him

## 2019-01-22 DIAGNOSIS — Z760 Encounter for issue of repeat prescription: Secondary | ICD-10-CM | POA: Diagnosis not present

## 2019-01-22 DIAGNOSIS — Z79899 Other long term (current) drug therapy: Secondary | ICD-10-CM | POA: Diagnosis not present

## 2019-01-22 DIAGNOSIS — G894 Chronic pain syndrome: Secondary | ICD-10-CM | POA: Diagnosis not present

## 2019-01-22 DIAGNOSIS — M5136 Other intervertebral disc degeneration, lumbar region: Secondary | ICD-10-CM | POA: Diagnosis not present

## 2019-01-23 DIAGNOSIS — M79671 Pain in right foot: Secondary | ICD-10-CM | POA: Diagnosis not present

## 2019-01-23 DIAGNOSIS — M722 Plantar fascial fibromatosis: Secondary | ICD-10-CM | POA: Diagnosis not present

## 2019-01-23 DIAGNOSIS — E119 Type 2 diabetes mellitus without complications: Secondary | ICD-10-CM | POA: Diagnosis not present

## 2019-01-23 DIAGNOSIS — M79672 Pain in left foot: Secondary | ICD-10-CM | POA: Diagnosis not present

## 2019-02-06 DIAGNOSIS — M79672 Pain in left foot: Secondary | ICD-10-CM | POA: Diagnosis not present

## 2019-02-06 DIAGNOSIS — M79671 Pain in right foot: Secondary | ICD-10-CM | POA: Diagnosis not present

## 2019-02-06 DIAGNOSIS — M722 Plantar fascial fibromatosis: Secondary | ICD-10-CM | POA: Diagnosis not present

## 2019-02-06 DIAGNOSIS — E119 Type 2 diabetes mellitus without complications: Secondary | ICD-10-CM | POA: Diagnosis not present

## 2019-02-11 DIAGNOSIS — S46011D Strain of muscle(s) and tendon(s) of the rotator cuff of right shoulder, subsequent encounter: Secondary | ICD-10-CM | POA: Diagnosis not present

## 2019-02-20 DIAGNOSIS — M25512 Pain in left shoulder: Secondary | ICD-10-CM | POA: Diagnosis not present

## 2019-02-21 DIAGNOSIS — Z79899 Other long term (current) drug therapy: Secondary | ICD-10-CM | POA: Diagnosis not present

## 2019-02-21 DIAGNOSIS — G894 Chronic pain syndrome: Secondary | ICD-10-CM | POA: Diagnosis not present

## 2019-02-21 DIAGNOSIS — M961 Postlaminectomy syndrome, not elsewhere classified: Secondary | ICD-10-CM | POA: Diagnosis not present

## 2019-02-21 DIAGNOSIS — Z760 Encounter for issue of repeat prescription: Secondary | ICD-10-CM | POA: Diagnosis not present

## 2019-02-22 ENCOUNTER — Other Ambulatory Visit: Payer: Self-pay | Admitting: Family Medicine

## 2019-02-22 DIAGNOSIS — K219 Gastro-esophageal reflux disease without esophagitis: Secondary | ICD-10-CM

## 2019-02-24 DIAGNOSIS — M25511 Pain in right shoulder: Secondary | ICD-10-CM | POA: Diagnosis not present

## 2019-02-24 DIAGNOSIS — S46011D Strain of muscle(s) and tendon(s) of the rotator cuff of right shoulder, subsequent encounter: Secondary | ICD-10-CM | POA: Diagnosis not present

## 2019-02-24 DIAGNOSIS — M6281 Muscle weakness (generalized): Secondary | ICD-10-CM | POA: Diagnosis not present

## 2019-03-05 DIAGNOSIS — E559 Vitamin D deficiency, unspecified: Secondary | ICD-10-CM | POA: Diagnosis not present

## 2019-03-05 DIAGNOSIS — E7800 Pure hypercholesterolemia, unspecified: Secondary | ICD-10-CM | POA: Diagnosis not present

## 2019-03-05 DIAGNOSIS — E119 Type 2 diabetes mellitus without complications: Secondary | ICD-10-CM | POA: Diagnosis not present

## 2019-03-05 DIAGNOSIS — Z79899 Other long term (current) drug therapy: Secondary | ICD-10-CM | POA: Diagnosis not present

## 2019-04-23 ENCOUNTER — Ambulatory Visit (INDEPENDENT_AMBULATORY_CARE_PROVIDER_SITE_OTHER): Payer: Medicare Other | Admitting: Podiatry

## 2019-04-23 ENCOUNTER — Other Ambulatory Visit: Payer: Self-pay

## 2019-04-23 ENCOUNTER — Encounter: Payer: Self-pay | Admitting: Podiatry

## 2019-04-23 DIAGNOSIS — M722 Plantar fascial fibromatosis: Secondary | ICD-10-CM | POA: Diagnosis not present

## 2019-04-23 NOTE — Progress Notes (Signed)
Subjective:   Patient ID: Riley Clarke, male   DOB: 63 y.o.   MRN: [id number]   HPI Patient presents with chronic heel pain of both feet and states he needs injections as he got about 3 months relief   ROS      Objective:  Physical Exam  Neurovascular status intact with exquisite discomfort heel region bilateral     Assessment:  Chronic Achilles tendinitis bilateral     Plan:  Sterile prep injected the medial fascial band bilateral 3 mg Kenalog 5 mg Xylocaine and reappoint to recheck

## 2019-06-12 ENCOUNTER — Other Ambulatory Visit: Payer: Self-pay

## 2019-06-12 ENCOUNTER — Encounter (HOSPITAL_BASED_OUTPATIENT_CLINIC_OR_DEPARTMENT_OTHER): Payer: Self-pay | Admitting: Orthopaedic Surgery

## 2019-06-16 ENCOUNTER — Other Ambulatory Visit (HOSPITAL_COMMUNITY)
Admission: RE | Admit: 2019-06-16 | Discharge: 2019-06-16 | Disposition: A | Discharge status: Home / Self Care | Payer: Medicare Other | Source: Ambulatory Visit | Attending: Orthopaedic Surgery | Admitting: Orthopaedic Surgery

## 2019-06-16 DIAGNOSIS — Z01812 Encounter for preprocedural laboratory examination: Secondary | ICD-10-CM | POA: Diagnosis present

## 2019-06-16 DIAGNOSIS — Z20822 Contact with and (suspected) exposure to covid-19: Secondary | ICD-10-CM | POA: Diagnosis not present

## 2019-06-16 NOTE — H&P (Signed)
PREOPERATIVE H&P  Chief Complaint: LEFT SHOULDER OSTEOARTHRITIS, IMPINGEMENT SYNDROME, STRAIN OF MUSCLE AND TENDONS OF THE ROTATOR CUFF  HPI: Riley Clarke is a 64 y.o. male who is scheduled for LEFT SHOULDER ARTHROSCOPY, EXTENTSIVE DEBRIDEMENT, DISTAL CLAVICULECTOMY, SUBACROMIAL DECOMPRESSION, PARTIAL ACROMIOPLASTY WITH CORACOACROMIAL RELEASE, ROTATOR CUFFF REPAIR AND BICEP TENODESIS.   Patient has a past medical history significant for HTN, DM, and GERD.   In July 2020, he had a right shoulder massive revision cuff repair. He did very well after this surgery and was happy with the progress he has made in regards to his right shoulder. However, he has continued to have left shoulder pain. He notes his left shoulder feels like his right shoulder did prior to surgery. He is unhappy with the function of his left shoulder. He previously had a left shoulder rotator cuff repair in 2015.   His symptoms are rated as moderate to severe, and have been worsening.  This is significantly impairing activities of daily living.    Please see clinic note for further details on this patient's care.    He has elected for surgical management.   Past Medical History:  Diagnosis Date  . Arthritis   . Chronic back pain    on chronic opioids  . Diabetes mellitus without complication ([place])   . GERD (gastroesophageal reflux disease)   . Headache(784.0)   . Hypertension    no meds   Past Surgical History:  Procedure Laterality Date  . BACK SURGERY    . CERVICAL DISC SURGERY Left 99  . ROTATOR CUFF REPAIR Bilateral   . SHOULDER ARTHROSCOPY WITH ROTATOR CUFF REPAIR AND SUBACROMIAL DECOMPRESSION Right 12/30/2018   Procedure: SHOULDER ARTHROSCOPY WITH ROTATOR CUFF REPAIR AND SUBACROMIAL DECOMPRESSION, DISTAL CLAVICLE EXICISION,;  Surgeon: Hiram Gash, MD;  Location: [facility];  Service: Orthopedics;  Laterality: Right;   Social History   Socioeconomic History  . Marital status:  Single    Spouse name: Not on file  . Number of children: Not on file  . Years of education: Not on file  . Highest education level: Not on file  Occupational History  . Not on file  Tobacco Use  . Smoking status: Current Some Day Smoker    Packs/day: 0.00    Years: 25.00    Pack years: 0.00    Types: Cigarettes  . Smokeless tobacco: Never Used  . Tobacco comment: rarely 2-3 cigs/week  Substance and Sexual Activity  . Alcohol use: Yes    Alcohol/week: 4.0 standard drinks    Types: 4 Cans of beer per week    Comment: twice a week  . Drug use: No  . Sexual activity: Not on file  Other Topics Concern  . Not on file  Social History Narrative  . Not on file   Social Determinants of Health   Financial Resource Strain:   . Difficulty of Paying Living Expenses: Not on file  Food Insecurity:   . Worried About Charity fundraiser in the Last Year: Not on file  . Ran Out of Food in the Last Year: Not on file  Transportation Needs:   . Lack of Transportation (Medical): Not on file  . Lack of Transportation (Non-Medical): Not on file  Physical Activity:   . Days of Exercise per Week: Not on file  . Minutes of Exercise per Session: Not on file  Stress:   . Feeling of Stress : Not on file  Social Connections:   .  Frequency of Communication with Friends and Family: Not on file  . Frequency of Social Gatherings with Friends and Family: Not on file  . Attends Religious Services: Not on file  . Active Member of Clubs or Organizations: Not on file  . Attends Archivist Meetings: Not on file  . Marital Status: Not on file   Family History  Problem Relation Age of Onset  . Heart disease Mother   . Diabetes Mother   . Amblyopia Neg Hx   . Blindness Neg Hx   . Cataracts Neg Hx   . Hypertension Neg Hx   . Macular degeneration Neg Hx   . Retinal detachment Neg Hx   . Strabismus Neg Hx    Allergies  Allergen Reactions  . Shrimp [Shellfish Allergy]     intolerant  .  Valium [Diazepam] Itching   Prior to Admission medications   Medication Sig Start Date End Date Taking? Authorizing Provider  acetaminophen (TYLENOL) 325 MG tablet Take 2 tablets (650 mg total) by mouth every 8 (eight) hours. 12/30/18 12/30/19 Yes Hiram Gash, MD  celecoxib (CELEBREX) 200 MG capsule Take 1 capsule (200 mg total) by mouth 2 (two) times daily. 12/30/18 12/30/19 Yes Hiram Gash, MD  Dulaglutide (TRULICITY) 1.5 HF/2.9MS SOPN INJECT 0.5 MILLILITERS INTO THE SKIN ONCE A WEEK 07/22/18  Yes Azzie Glatter, FNP  esomeprazole (NEXIUM) 40 MG capsule TAKE ONE (1) CAPSULE BY MOUTH EVERY MORNING BEFORE BREAKFAST 06/28/18  Yes Azzie Glatter, FNP  gabapentin (NEURONTIN) 300 MG capsule TAKE 1 CAPSULE BY MOUTH THREE TIMES A DAY 02/05/18  Yes Azzie Glatter, FNP  glucose blood (ACCU-CHEK AVIVA) test strip Use as instructed 04/05/15  Yes Micheline Chapman, NP  JARDIANCE 25 MG TABS tablet TAKE ONE (1) TABLET BY MOUTH EVERY DAY 12/10/17  Yes Dorena Dew, FNP  linagliptin (TRADJENTA) 5 MG TABS tablet Take 1 tablet (5 mg total) by mouth daily. 04/22/18  Yes Azzie Glatter, FNP  metFORMIN (GLUCOPHAGE) 1000 MG tablet TAKE 1 TABLET BY MOUTH 2 TIMES DAILY WITH A MEAL. 06/28/18  Yes Azzie Glatter, FNP  oxyCODONE-acetaminophen (PERCOCET) 10-325 MG tablet Take 1 tablet by mouth every 4 (four) hours as needed for pain.   Yes [provider]  Vitamin D, Ergocalciferol, (DRISDOL) 50000 UNITS CAPS capsule Take 50,000 Units by mouth every 7 (seven) days. Reported on 07/05/2015   Yes [provider]  NARCAN 4 MG/0.1ML LIQD nasal spray kit  04/08/18   [provider]    ROS: All other systems have been reviewed and were otherwise negative with the exception of those mentioned in the HPI and as above.  Physical Exam: General: Alert, no acute distress Cardiovascular: No pedal edema Respiratory: No cyanosis, no use of accessory musculature GI: No organomegaly, abdomen is  soft and non-tender Skin: No lesions in the area of chief complaint Neurologic: Sensation intact distally Psychiatric: Patient is competent for consent with normal mood and affect Lymphatic: No axillary or cervical lymphadenopathy  MUSCULOSKELETAL:  Left shoulder: weakness with supraspinatus and infraspinatus testing. He has pain with palpation along the [facility] joint. Positive O'Brien's.   Imaging: MRI of the left shoulder demonstrates recurrent supraspinatus tear, full thickness with  bicipital tendinosis and moderate AC arthrosis.  Assessment: LEFT SHOULDER OSTEOARTHRITIS, IMPINGEMENT SYNDROME, STRAIN OF MUSCLE AND TENDONS OF THE ROTATOR CUFF  Plan: Plan for Procedure(s): LEFT SHOULDER ARTHROSCOPY, EXTENTSIVE DEBRIDEMENT, DISTAL CLAVICULECTOMY, SUBACROMIAL DECOMPRESSION, PARTIAL ACROMIOPLASTY WITH CORACOACROMIAL RELEASE, ROTATOR CUFFF REPAIR AND  BICEP TENODESIS  The risks benefits and alternatives were discussed with the patient including but not limited to the risks of nonoperative treatment, versus surgical intervention including infection, bleeding, nerve injury,  blood clots, cardiopulmonary complications, morbidity, mortality, among others, and they were willing to proceed.   The patient acknowledged the explanation, agreed to proceed with the plan and consent was signed.   Operative Plan: left shoulder scope, SAD, DCE, revision cuff repair, and possible biceps tenodesis Discharge Medications: Tylenol, Celebrex 200 mg BID, Oxycodone, Zofran DVT Prophylaxis: None Physical Therapy: Outpatient PT Special Discharge needs: [place], PA-C  06/16/2019 1:36 PM

## 2019-06-17 ENCOUNTER — Encounter (HOSPITAL_BASED_OUTPATIENT_CLINIC_OR_DEPARTMENT_OTHER)
Admission: RE | Admit: 2019-06-17 | Discharge: 2019-06-17 | Disposition: A | Discharge status: Home / Self Care | Payer: Medicare Other | Source: Ambulatory Visit | Attending: Orthopaedic Surgery | Admitting: Orthopaedic Surgery

## 2019-06-17 ENCOUNTER — Other Ambulatory Visit: Payer: Self-pay

## 2019-06-17 DIAGNOSIS — M549 Dorsalgia, unspecified: Secondary | ICD-10-CM | POA: Diagnosis not present

## 2019-06-17 DIAGNOSIS — K219 Gastro-esophageal reflux disease without esophagitis: Secondary | ICD-10-CM | POA: Diagnosis not present

## 2019-06-17 DIAGNOSIS — Z01812 Encounter for preprocedural laboratory examination: Secondary | ICD-10-CM | POA: Diagnosis not present

## 2019-06-17 DIAGNOSIS — Z79899 Other long term (current) drug therapy: Secondary | ICD-10-CM | POA: Diagnosis not present

## 2019-06-17 DIAGNOSIS — Z79891 Long term (current) use of opiate analgesic: Secondary | ICD-10-CM | POA: Diagnosis not present

## 2019-06-17 DIAGNOSIS — G8929 Other chronic pain: Secondary | ICD-10-CM | POA: Diagnosis not present

## 2019-06-17 DIAGNOSIS — I10 Essential (primary) hypertension: Secondary | ICD-10-CM | POA: Diagnosis not present

## 2019-06-17 DIAGNOSIS — S46012A Strain of muscle(s) and tendon(s) of the rotator cuff of left shoulder, initial encounter: Secondary | ICD-10-CM | POA: Diagnosis present

## 2019-06-17 DIAGNOSIS — E119 Type 2 diabetes mellitus without complications: Secondary | ICD-10-CM | POA: Diagnosis not present

## 2019-06-17 DIAGNOSIS — X58XXXA Exposure to other specified factors, initial encounter: Secondary | ICD-10-CM | POA: Diagnosis not present

## 2019-06-17 DIAGNOSIS — M19012 Primary osteoarthritis, left shoulder: Secondary | ICD-10-CM | POA: Diagnosis not present

## 2019-06-17 DIAGNOSIS — Z7984 Long term (current) use of oral hypoglycemic drugs: Secondary | ICD-10-CM | POA: Diagnosis not present

## 2019-06-17 DIAGNOSIS — F17210 Nicotine dependence, cigarettes, uncomplicated: Secondary | ICD-10-CM | POA: Diagnosis not present

## 2019-06-17 LAB — BASIC METABOLIC PANEL
Anion gap: 14 (ref 5–15)
BUN: 6 mg/dL — ABNORMAL LOW (ref 8–23)
CO2: 21 mmol/L — ABNORMAL LOW (ref 22–32)
Calcium: 9 mg/dL (ref 8.9–10.3)
Chloride: 103 mmol/L (ref 98–111)
Creatinine, Ser: 0.8 mg/dL (ref 0.61–1.24)
GFR calc Af Amer: >60 mL/min (ref >60)
GFR calc non Af Amer: >60 mL/min (ref >60)
Glucose, Bld: 121 mg/dL — ABNORMAL HIGH (ref 70–99)
Potassium: 4.3 mmol/L (ref 3.5–5.1)
Sodium: 138 mmol/L (ref 135–145)

## 2019-06-17 LAB — NOVEL CORONAVIRUS, NAA (HOSP ORDER, SEND-OUT TO REF LAB; TAT 18-24 HRS): SARS-CoV-2, NAA: NOT DETECTED

## 2019-06-17 NOTE — Progress Notes (Addendum)

## 2019-06-18 NOTE — Anesthesia Preprocedure Evaluation (Signed)
Anesthesia Evaluation  Patient identified by MRN, date of birth, ID band Patient awake    Reviewed: Allergy & Precautions, H&P , NPO status , Patient's Chart, lab work & pertinent test results, reviewed documented beta blocker date and time   Airway Mallampati: I  TM Distance: >3 FB Neck ROM: Full    Dental no notable dental hx. (+) Edentulous Upper, Dental Advisory Given, Missing   Pulmonary neg pulmonary ROS, Current Smoker,    Pulmonary exam normal breath sounds clear to auscultation       Cardiovascular Exercise Tolerance: Good hypertension, Pt. on medications negative cardio ROS   Rhythm:regular Rate:Normal     Neuro/Psych negative neurological ROS  negative psych ROS   GI/Hepatic negative GI ROS, Neg liver ROS, GERD  Medicated and Controlled,  Endo/Other  negative endocrine ROSdiabetes, Type 2, Oral Hypoglycemic Agents  Renal/GU negative Renal ROS  negative genitourinary   Musculoskeletal   Abdominal   Peds  Hematology negative hematology ROS (+)   Anesthesia Other Findings   Reproductive/Obstetrics negative OB ROS                             Anesthesia Physical  Anesthesia Plan  ASA: III  Anesthesia Plan: General   Post-op Pain Management: GA combined w/ Regional for post-op pain   Induction: Intravenous  PONV Risk Score and Plan: 2 and Ondansetron and Treatment may vary due to age or medical condition  Airway Management Planned: Oral ETT and LMA  Additional Equipment:   Intra-op Plan:   Post-operative Plan: Extubation in OR  Informed Consent: I have reviewed the patients History and Physical, chart, labs and discussed the procedure including the risks, benefits and alternatives for the proposed anesthesia with the patient or authorized representative who has indicated his/her understanding and acceptance.     Dental advisory given  Plan Discussed with: CRNA,  Surgeon and Anesthesiologist  Anesthesia Plan Comments: (Discussed both nerve block for pain relief post-op and GA; including NV, sore throat, dental injury, and pulmonary complications)        Anesthesia Quick Evaluation

## 2019-06-19 ENCOUNTER — Ambulatory Visit (HOSPITAL_BASED_OUTPATIENT_CLINIC_OR_DEPARTMENT_OTHER): Payer: Medicare Other | Admitting: Anesthesiology

## 2019-06-19 ENCOUNTER — Other Ambulatory Visit: Payer: Self-pay

## 2019-06-19 ENCOUNTER — Ambulatory Visit (HOSPITAL_BASED_OUTPATIENT_CLINIC_OR_DEPARTMENT_OTHER)
Admission: RE | Admit: 2019-06-19 | Discharge: 2019-06-19 | Disposition: A | Discharge status: Home / Self Care | Payer: Medicare Other | Attending: Orthopaedic Surgery | Admitting: Orthopaedic Surgery

## 2019-06-19 ENCOUNTER — Encounter (HOSPITAL_BASED_OUTPATIENT_CLINIC_OR_DEPARTMENT_OTHER)
Admission: RE | Disposition: A | Discharge status: Home / Self Care | Payer: Self-pay | Source: Home / Self Care | Attending: Orthopaedic Surgery

## 2019-06-19 ENCOUNTER — Encounter (HOSPITAL_BASED_OUTPATIENT_CLINIC_OR_DEPARTMENT_OTHER): Payer: Self-pay | Admitting: Orthopaedic Surgery

## 2019-06-19 DIAGNOSIS — F17210 Nicotine dependence, cigarettes, uncomplicated: Secondary | ICD-10-CM | POA: Insufficient documentation

## 2019-06-19 DIAGNOSIS — X58XXXA Exposure to other specified factors, initial encounter: Secondary | ICD-10-CM | POA: Insufficient documentation

## 2019-06-19 DIAGNOSIS — S46012A Strain of muscle(s) and tendon(s) of the rotator cuff of left shoulder, initial encounter: Secondary | ICD-10-CM | POA: Insufficient documentation

## 2019-06-19 DIAGNOSIS — G8929 Other chronic pain: Secondary | ICD-10-CM | POA: Insufficient documentation

## 2019-06-19 DIAGNOSIS — Z01812 Encounter for preprocedural laboratory examination: Secondary | ICD-10-CM | POA: Insufficient documentation

## 2019-06-19 DIAGNOSIS — Z79891 Long term (current) use of opiate analgesic: Secondary | ICD-10-CM | POA: Insufficient documentation

## 2019-06-19 DIAGNOSIS — M19012 Primary osteoarthritis, left shoulder: Secondary | ICD-10-CM | POA: Insufficient documentation

## 2019-06-19 DIAGNOSIS — M549 Dorsalgia, unspecified: Secondary | ICD-10-CM | POA: Insufficient documentation

## 2019-06-19 DIAGNOSIS — Z79899 Other long term (current) drug therapy: Secondary | ICD-10-CM | POA: Insufficient documentation

## 2019-06-19 DIAGNOSIS — E119 Type 2 diabetes mellitus without complications: Secondary | ICD-10-CM | POA: Insufficient documentation

## 2019-06-19 DIAGNOSIS — I10 Essential (primary) hypertension: Secondary | ICD-10-CM | POA: Insufficient documentation

## 2019-06-19 DIAGNOSIS — Z7984 Long term (current) use of oral hypoglycemic drugs: Secondary | ICD-10-CM | POA: Insufficient documentation

## 2019-06-19 DIAGNOSIS — K219 Gastro-esophageal reflux disease without esophagitis: Secondary | ICD-10-CM | POA: Insufficient documentation

## 2019-06-19 HISTORY — PX: SHOULDER ARTHROSCOPY WITH SUBACROMIAL DECOMPRESSION, ROTATOR CUFF REPAIR AND BICEP TENDON REPAIR: SHX5687

## 2019-06-19 LAB — GLUCOSE, CAPILLARY
Glucose-Capillary: 142 mg/dL — ABNORMAL HIGH (ref 70–99)
Glucose-Capillary: 150 mg/dL — ABNORMAL HIGH (ref 70–99)

## 2019-06-19 SURGERY — SHOULDER ARTHROSCOPY WITH SUBACROMIAL DECOMPRESSION, ROTATOR CUFF REPAIR AND BICEP TENDON REPAIR
Anesthesia: General | Site: Shoulder | Laterality: Left

## 2019-06-19 MED ORDER — LIDOCAINE 2% (20 MG/ML) 5 ML SYRINGE
INTRAMUSCULAR | Status: AC
  Filled 2019-06-19: qty 5

## 2019-06-19 MED ORDER — PHENYLEPHRINE 40 MCG/ML (10ML) SYRINGE FOR IV PUSH (FOR BLOOD PRESSURE SUPPORT)
PREFILLED_SYRINGE | INTRAVENOUS | Status: DC | PRN
  Administered 2019-06-19: 80 ug via INTRAVENOUS

## 2019-06-19 MED ORDER — CELECOXIB 200 MG PO CAPS: 200.0000 mg | ORAL_CAPSULE | Freq: Two times a day (BID) | ORAL | 1 refills | Status: AC

## 2019-06-19 MED ORDER — ONDANSETRON HCL 4 MG/2ML IJ SOLN
INTRAMUSCULAR | Status: AC
  Filled 2019-06-19: qty 2

## 2019-06-19 MED ORDER — SUGAMMADEX SODIUM 500 MG/5ML IV SOLN
INTRAVENOUS | Status: AC
  Filled 2019-06-19: qty 5

## 2019-06-19 MED ORDER — CEFAZOLIN SODIUM-DEXTROSE 2-4 GM/100ML-% IV SOLN
2.0000 g | INTRAVENOUS | Status: AC
  Administered 2019-06-19: 2 g via INTRAVENOUS

## 2019-06-19 MED ORDER — BUPIVACAINE HCL 0.5 % IJ SOLN
INTRAMUSCULAR | Status: DC | PRN
  Administered 2019-06-19: 20 mL

## 2019-06-19 MED ORDER — LACTATED RINGERS IV SOLN: INTRAVENOUS | Status: DC

## 2019-06-19 MED ORDER — BUPIVACAINE HCL (PF) 0.25 % IJ SOLN
INTRAMUSCULAR | Status: AC
  Filled 2019-06-19: qty 30

## 2019-06-19 MED ORDER — ONDANSETRON HCL 4 MG/2ML IJ SOLN
INTRAMUSCULAR | Status: DC | PRN
  Administered 2019-06-19: 4 mg via INTRAVENOUS

## 2019-06-19 MED ORDER — SODIUM CHLORIDE 0.9 % IR SOLN
Status: DC | PRN
  Administered 2019-06-19: 2 mL

## 2019-06-19 MED ORDER — MIDAZOLAM HCL 2 MG/2ML IJ SOLN
INTRAMUSCULAR | Status: AC
  Filled 2019-06-19: qty 2

## 2019-06-19 MED ORDER — ROCURONIUM BROMIDE 50 MG/5ML IV SOSY
PREFILLED_SYRINGE | INTRAVENOUS | Status: DC | PRN
  Administered 2019-06-19: 70 mg via INTRAVENOUS

## 2019-06-19 MED ORDER — EPINEPHRINE PF 1 MG/ML IJ SOLN
INTRAMUSCULAR | Status: AC
  Filled 2019-06-19: qty 6

## 2019-06-19 MED ORDER — FENTANYL CITRATE (PF) 100 MCG/2ML IJ SOLN
50.0000 ug | INTRAMUSCULAR | Status: DC | PRN
  Administered 2019-06-19: 100 ug via INTRAVENOUS

## 2019-06-19 MED ORDER — PHENYLEPHRINE HCL-NACL 10-0.9 MG/250ML-% IV SOLN
INTRAVENOUS | Status: DC | PRN
  Administered 2019-06-19: 50 ug/min via INTRAVENOUS

## 2019-06-19 MED ORDER — BUPIVACAINE HCL (PF) 0.5 % IJ SOLN
INTRAMUSCULAR | Status: AC
  Filled 2019-06-19: qty 30

## 2019-06-19 MED ORDER — ACETAMINOPHEN 325 MG PO TABS: 650.0000 mg | ORAL_TABLET | Freq: Three times a day (TID) | ORAL | 2 refills | Status: DC

## 2019-06-19 MED ORDER — MIDAZOLAM HCL 2 MG/2ML IJ SOLN
2.0000 mg | Freq: Once | INTRAMUSCULAR | Status: AC
  Administered 2019-06-19: 2 mg via INTRAVENOUS

## 2019-06-19 MED ORDER — PHENYLEPHRINE 40 MCG/ML (10ML) SYRINGE FOR IV PUSH (FOR BLOOD PRESSURE SUPPORT)
PREFILLED_SYRINGE | INTRAVENOUS | Status: AC
  Filled 2019-06-19: qty 10

## 2019-06-19 MED ORDER — BUPIVACAINE LIPOSOME 1.3 % IJ SUSP
INTRAMUSCULAR | Status: DC | PRN
  Administered 2019-06-19: 10 mL via PERINEURAL

## 2019-06-19 MED ORDER — FENTANYL CITRATE (PF) 100 MCG/2ML IJ SOLN
INTRAMUSCULAR | Status: AC
  Filled 2019-06-19: qty 2

## 2019-06-19 MED ORDER — PROPOFOL 10 MG/ML IV BOLUS
INTRAVENOUS | Status: DC | PRN
  Administered 2019-06-19: 200 mg via INTRAVENOUS

## 2019-06-19 MED ORDER — DEXAMETHASONE SODIUM PHOSPHATE 10 MG/ML IJ SOLN
INTRAMUSCULAR | Status: DC | PRN
  Administered 2019-06-19: 5 mg via INTRAVENOUS

## 2019-06-19 MED ORDER — DEXAMETHASONE SODIUM PHOSPHATE 10 MG/ML IJ SOLN
INTRAMUSCULAR | Status: AC
  Filled 2019-06-19: qty 1

## 2019-06-19 MED ORDER — PROPOFOL 500 MG/50ML IV EMUL
INTRAVENOUS | Status: AC
  Filled 2019-06-19: qty 50

## 2019-06-19 MED ORDER — ROCURONIUM BROMIDE 10 MG/ML (PF) SYRINGE
PREFILLED_SYRINGE | INTRAVENOUS | Status: AC
  Filled 2019-06-19: qty 10

## 2019-06-19 MED ORDER — OXYCODONE HCL 5 MG PO TABS: ORAL_TABLET | ORAL | 0 refills | Status: AC

## 2019-06-19 MED ORDER — ONDANSETRON HCL 4 MG PO TABS: 4.0000 mg | ORAL_TABLET | Freq: Three times a day (TID) | ORAL | 1 refills | Status: AC | PRN

## 2019-06-19 MED ORDER — CEFAZOLIN SODIUM-DEXTROSE 2-4 GM/100ML-% IV SOLN
INTRAVENOUS | Status: AC
  Filled 2019-06-19: qty 100

## 2019-06-19 MED ORDER — CHLORHEXIDINE GLUCONATE 4 % EX LIQD: 60.0000 mL | Freq: Once | CUTANEOUS | Status: DC

## 2019-06-19 MED ORDER — LIDOCAINE 2% (20 MG/ML) 5 ML SYRINGE
INTRAMUSCULAR | Status: DC | PRN
  Administered 2019-06-19: 100 mg via INTRAVENOUS

## 2019-06-19 MED ORDER — SUGAMMADEX SODIUM 200 MG/2ML IV SOLN
INTRAVENOUS | Status: DC | PRN
  Administered 2019-06-19: 250 mg via INTRAVENOUS

## 2019-06-19 SURGICAL SUPPLY — 70 items
AID PSTN UNV HD RSTRNT DISP (MISCELLANEOUS) ×1
ANCHOR SUT 1.8 FBRTK KNTLS 2SU (Anchor) ×4 IMPLANT
APL PRP STRL LF DISP 70% ISPRP (MISCELLANEOUS) ×1
BLADE EXCALIBUR 4.0MM X 13CM (MISCELLANEOUS) ×1
BLADE EXCALIBUR 4.0X13 (MISCELLANEOUS) ×2 IMPLANT
BLADE SURG 10 STRL SS (BLADE) IMPLANT
BURR OVAL 8 FLU 4.0MM X 13CM (MISCELLANEOUS)
BURR OVAL 8 FLU 4.0X13 (MISCELLANEOUS) IMPLANT
CANNULA 5.75X71 LONG (CANNULA) IMPLANT
CANNULA PASSPORT 5 (CANNULA) ×1 IMPLANT
CANNULA PASSPORT 5CM (CANNULA) ×1
CANNULA PASSPORT BUTTON 10-40 (CANNULA) ×2 IMPLANT
CANNULA TWIST IN 8.25X7CM (CANNULA) IMPLANT
CHLORAPREP W/TINT 26 (MISCELLANEOUS) ×3 IMPLANT
CLOSURE STERI-STRIP 1/2X4 (GAUZE/BANDAGES/DRESSINGS) ×1
CLSR STERI-STRIP ANTIMIC 1/2X4 (GAUZE/BANDAGES/DRESSINGS) ×2 IMPLANT
COVER WAND RF STERILE (DRAPES) IMPLANT
DECANTER SPIKE VIAL GLASS SM (MISCELLANEOUS) IMPLANT
DISSECTOR 3.5MM X 13CM CVD (MISCELLANEOUS) IMPLANT
DISSECTOR 4.0MMX13CM CVD (MISCELLANEOUS) IMPLANT
DRAPE HALF SHEET 70X43 (DRAPES) IMPLANT
DRAPE IMP U-DRAPE 54X76 (DRAPES) ×3 IMPLANT
DRAPE INCISE IOBAN 66X45 STRL (DRAPES) IMPLANT
DRAPE SHOULDER BEACH CHAIR (DRAPES) ×3 IMPLANT
DRSG PAD ABDOMINAL 8X10 ST (GAUZE/BANDAGES/DRESSINGS) ×3 IMPLANT
DW OUTFLOW CASSETTE/TUBE SET (MISCELLANEOUS) ×3 IMPLANT
GAUZE SPONGE 4X4 12PLY STRL (GAUZE/BANDAGES/DRESSINGS) ×3 IMPLANT
GLOVE BIO SURGEON STRL SZ 6.5 (GLOVE) ×3 IMPLANT
GLOVE BIO SURGEONS STRL SZ 6.5 (GLOVE) ×2
GLOVE BIOGEL PI IND STRL 6.5 (GLOVE) ×1 IMPLANT
GLOVE BIOGEL PI IND STRL 7.0 (GLOVE) IMPLANT
GLOVE BIOGEL PI IND STRL 8 (GLOVE) ×1 IMPLANT
GLOVE BIOGEL PI INDICATOR 6.5 (GLOVE) ×2
GLOVE BIOGEL PI INDICATOR 7.0 (GLOVE) ×2
GLOVE BIOGEL PI INDICATOR 8 (GLOVE) ×4
GLOVE ECLIPSE 8.0 STRL XLNG CF (GLOVE) ×3 IMPLANT
GOWN STRL REUS W/ TWL LRG LVL3 (GOWN DISPOSABLE) ×2 IMPLANT
GOWN STRL REUS W/TWL LRG LVL3 (GOWN DISPOSABLE) ×6
GOWN STRL REUS W/TWL XL LVL3 (GOWN DISPOSABLE) ×3 IMPLANT
IMPL SPEEDBRIDGE KIT (Orthopedic Implant) IMPLANT
IMPLANT SPEEDBRIDGE KIT (Orthopedic Implant) ×3 IMPLANT
KIT STABILIZATION SHOULDER (MISCELLANEOUS) ×3 IMPLANT
KIT STR SPEAR 1.8 FBRTK DISP (KITS) ×2 IMPLANT
LASSO CRESCENT QUICKPASS (SUTURE) IMPLANT
MANIFOLD NEPTUNE II (INSTRUMENTS) ×3 IMPLANT
NDL SAFETY ECLIPSE 18X1.5 (NEEDLE) ×1 IMPLANT
NDL SCORPION MULTI FIRE (NEEDLE) IMPLANT
NEEDLE HYPO 18GX1.5 SHARP (NEEDLE) ×3
NEEDLE SCORPION MULTI FIRE (NEEDLE) IMPLANT
PACK ARTHROSCOPY DSU (CUSTOM PROCEDURE TRAY) ×3 IMPLANT
PACK BASIN DAY SURGERY FS (CUSTOM PROCEDURE TRAY) ×3 IMPLANT
PAD ORTHO SHOULDER 7X19 LRG (SOFTGOODS) ×2 IMPLANT
PORT APPOLLO RF 90DEGREE MULTI (SURGICAL WAND) IMPLANT
RESTRAINT HEAD UNIVERSAL NS (MISCELLANEOUS) ×3 IMPLANT
SLEEVE SCD COMPRESS KNEE MED (MISCELLANEOUS) ×3 IMPLANT
SLING ARM FOAM STRAP LRG (SOFTGOODS) IMPLANT
SUT FIBERWIRE #2 38 T-5 BLUE (SUTURE)
SUT MNCRL AB 4-0 PS2 18 (SUTURE) ×5 IMPLANT
SUT PDS AB 1 CT  36 (SUTURE)
SUT PDS AB 1 CT 36 (SUTURE) IMPLANT
SUT TIGER TAPE 7 IN WHITE (SUTURE) IMPLANT
SUTURE FIBERWR #2 38 T-5 BLUE (SUTURE) IMPLANT
SUTURE TAPE TIGERLINK 1.3MM BL (SUTURE) IMPLANT
SUTURETAPE TIGERLINK 1.3MM BL (SUTURE)
SYR 5ML LL (SYRINGE) ×3 IMPLANT
TAPE FIBER 2MM 7IN #2 BLUE (SUTURE) IMPLANT
TOWEL GREEN STERILE FF (TOWEL DISPOSABLE) ×3 IMPLANT
TUBE CONNECTING 20'X1/4 (TUBING) ×1
TUBE CONNECTING 20X1/4 (TUBING) ×2 IMPLANT
TUBING ARTHROSCOPY IRRIG 16FT (MISCELLANEOUS) ×3 IMPLANT

## 2019-06-19 NOTE — Progress Notes (Signed)
Assisted Dr. Oddono with left, ultrasound guided, interscalene  block. Side rails up, monitors on throughout procedure. See vital signs in flow sheet. Tolerated Procedure well. 

## 2019-06-19 NOTE — Anesthesia Procedure Notes (Signed)
Procedure Name: Intubation Date/Time: 06/19/2019 7:37 AM Performed by: Pearson Grippe, CRNA Pre-anesthesia Checklist: Patient identified, Emergency Drugs available, Suction available and Patient being monitored Patient Re-evaluated:Patient Re-evaluated prior to induction Oxygen Delivery Method: Circle system utilized Preoxygenation: Pre-oxygenation with 100% oxygen Induction Type: IV induction Ventilation: Mask ventilation without difficulty Laryngoscope Size: Miller and 2 Grade View: Grade I Tube type: Oral Tube size: 8.0 mm Number of attempts: 1 Airway Equipment and Method: Stylet and Oral airway Placement Confirmation: ETT inserted through vocal cords under direct vision,  positive ETCO2 and breath sounds checked- equal and bilateral Secured at: 23 cm Tube secured with: Tape Dental Injury: Teeth and Oropharynx as per pre-operative assessment

## 2019-06-19 NOTE — Op Note (Signed)
Orthopaedic Surgery Operative Note (CSN: [id number])  Riley Clarke  11-21-1955 Date of Surgery: 06/19/2019   Diagnoses:  Recurrent left rotator cuff tear  Procedure: Arthroscopic extensive debridement Arthroscopic subacromial decompression Arthroscopic rotator cuff repair Arthroscopic biceps tenodesis Arthroscopic distal clavicle excision   Operative Finding Exam under anesthesia: full motion no limtiation Articular space: No loose bodies, capsule intact, labral fraying anteriorly noted, fraying of superior labrum and biceps as well all debrided Chondral surfaces:mild irregularity of humeral head, grade 1/2 changes scattered superiorly on head. Biceps: extensive fraying and tearing with near complete tearing from superior labrum Subscapularis: normal Superior Cuff:significant cutthrough of the previous single row repair and tissue quality was poor.  There was a split longitudinally in the tendon and we had to mobilize the tissue extensively to get a good repair.  Tissue quality was poor and is at risk for pull through again even with rip stop sutures.  Bursal side: as above, significant bursitis and fraying of cuff.  Successful completion of the planned procedure.  Patient tissue quality and tear pattern lead to a high risk of retear.  He would be a candidate in the early setting for SCR but later would likely be a Reverse candidate.   Post-operative plan: The patient will be non-weightbearing in a sling for 6 weeks with delayed therapy.  The patient will be discharged home.  DVT prophylaxis not indicated in ambulatory upper extremity patient without known risk factors.   Pain control with PRN pain medication preferring oral medicines.  Follow up plan will be scheduled in approximately 7 days for incision check and XR.  Post-Op Diagnosis: Same Surgeons:Primary: Hiram Gash, MD Assistants:Caroline McBane PA-C Location: [place] OR ROOM 6 Anesthesia: General with Exparel interscalene  block Antibiotics: Ancef 2 g Tourniquet time: None Estimated Blood Loss: Minimal Complications: None Specimens: None Implants: Implant Name Type Inv. Item Serial No. Manufacturer Lot No. LRB No. Used Action  IMPLANT SPEEDBRIDGE KIT - [id number] Orthopedic Implant IMPLANT SPEEDBRIDGE KIT  [employer] [id number] Left 1 Implanted  ANCHOR SUT 1.8 FIBERTAK 2 SUT - [id number] Anchor ANCHOR SUT 1.8 FIBERTAK 2 SUT  [employer] [id number] Left 1 Implanted  ANCHOR SUT 1.8 FIBERTAK 2 SUT - [id number] Anchor ANCHOR SUT 1.8 FIBERTAK 2 SUT  [employer] [id number] Left 1 Implanted    Indications for Surgery:   Riley Clarke is a 64 y.o. male with continued shoulder pain refractory to nonoperative measures for extended period of time.  He had previous single row repair of the cuff but it had failed and he did well with a revision cuff on the contralateral side.  The risks and benefits were explained at length including but not limited to continued pain, cuff failure, biceps tenodesis failure, stiffness, need for further surgery and infection.   Procedure:   Patient was correctly identified in the preoperative holding area and operative site marked.  Patient brought to OR and positioned beachchair on an [place] table ensuring that all bony prominences were padded and the head was in an appropriate location.  Anesthesia was induced and the operative shoulder was prepped and draped in the usual sterile fashion.  Timeout was called preincision.  A standard posterior viewing portal was made after localizing the portal with a spinal needle.  An anterior accessory portal was also made.  After clearing the articular space the camera was positioned in the subacromial space.  Findings above.    Extensive debridement was performed of the anterior interval tissue, labral fraying and the  bursa.  Previous suture material was removed with the shaver and grasper from the cut through repair.  Tissue was poor quality  Subacromial  decompression: We made a lateral portal with spinal needle guidance. We then proceeded to debride bursal tissue extensively with a shaver and arthrocare device. At that point we continued to identify the borders of the acromion and identify the spur. We then carefully preserved the deltoid fascia and used a burr to convert the acromion to a Type 1 flat acromion without issue.  Distal Clavicle resection:  The scope was placed in the subacromial space from the posterior portal.  A hemostat was placed through the anterior portal and we spread at the [facility] joint.  A burr was then inserted and 10 mm of distal clavicle was resected taking care to avoid damage to the capsule around the joint and avoiding overhanging bone posteriorly.    Biceps tenodesis: We marked the tendon and then performed a tenotomy and debridement of the stump in the articular space. We then identified the biceps tendon in its groove suprapec with the arthroscope in the lateral portal taking care to move from lateral to medial to avoid injury to the subscapularis. At that point we unroofed the tendon itself and mobilized it. An accessory anterior portal was made in line with the tendon and we grasped it from the anterior superior portal and worked from the accessory anterior portal. Two Fibertak 1.46m knotless anchors were placed in the groove and the tendon was secured in a luggage loop style fashion with a pass of the limb of suture through the tendon using a scorpion device to avoid pull-through.  Repair was completed with good tension on the tendon.  Residual stump of the tendon was removed after being resected with a RF ablator.  Arthroscopic Rotator Cuff Repair: Tuberosity was prepared with a burr to a bleeding bed.  Following completion of the above we placed 2 4.7 Swivelock anchor loaded with a tape at inserted at the medial articular margin and an scorpion suture passing device, shuttled  sutures medially in a horizontal mattress suture  configuration.  We then tied using arthroscopic knot tying techniques  each suture to its partner reducing the tendon at the prepared insertion site.  The fiber tape was not tied. With a medial row suture limbs then incorporated, 2 anteriorly and  2 posteriorly, into each of two 4.75 PEEK SwiveLock anchors, each placed 8 to 10 mm below the tip of the tuberosity and spanning anterior-posterior width of the tear with care to avoid over tensioning.   The incisions were closed with absorbable monocryl and steri strips.  A sterile dressing was placed along with a sling. The patient was awoken from general anesthesia and taken to the PACU in stable condition without complication.   CNoemi Chapel PA-C, present and scrubbed throughout the case, critical for completion in a timely fashion, and for retraction, instrumentation, closure.

## 2019-06-19 NOTE — Anesthesia Procedure Notes (Addendum)
Anesthesia Regional Block: Interscalene brachial plexus block   Pre-Anesthetic Checklist: ,, timeout performed, Correct Patient, Correct Site, Correct Laterality, Correct Procedure, Correct Position, site marked, Risks and benefits discussed,  Surgical consent,  Pre-op evaluation,  At surgeon's request and post-op pain management  Laterality: Left  Prep: chloraprep       Needles:  Injection technique: Single-shot  Needle Type: Echogenic Stimulator Needle     Needle Length: 5cm  Needle Gauge: 22     Additional Needles:   Procedures:, nerve stimulator,,, ultrasound used (permanent image in chart),,,,   Nerve Stimulator or Paresthesia:  Response: hand, 0.45 mA,   Additional Responses:   Narrative:  Start time: 06/19/2019 6:56 AM End time: 06/19/2019 7:00 AM Injection made incrementally with aspirations every 5 mL.  Performed by: Personally  Anesthesiologist: Bethena Midget, MD  Additional Notes: Functioning IV was confirmed and monitors were applied.  A 80mm 22ga Arrow echogenic stimulator needle was used. Sterile prep and drape,hand hygiene and sterile gloves were used. Ultrasound guidance: relevant anatomy identified, needle position confirmed, local anesthetic spread visualized around nerve(s)., vascular puncture avoided.  Image printed for medical record. Negative aspiration and negative test dose prior to incremental administration of local anesthetic. The patient tolerated the procedure well.

## 2019-06-19 NOTE — Transfer of Care (Signed)
Immediate Anesthesia Transfer of Care Note  Patient: Riley Clarke  Procedure(s) Performed: LEFT SHOULDER ARTHROSCOPY, EXTENTSIVE DEBRIDEMENT, DISTAL CLAVICULECTOMY, SUBACROMIAL DECOMPRESSION, PARTIAL ACROMIOPLASTY WITH CORACOACROMIAL RELEASE, ROTATOR CUFFF REPAIR AND BICEP TENODESIS (Left Shoulder)  Patient Location: PACU  Anesthesia Type:General and Regional  Level of Consciousness: awake, alert  and oriented  Airway & Oxygen Therapy: Patient Spontanous Breathing and Patient connected to face mask oxygen  Post-op Assessment: Report given to RN and Post -op Vital signs reviewed and stable  Post vital signs: Reviewed and stable  Last Vitals:  Vitals Value Taken Time  BP 163/92 06/19/19 0933  Temp    Pulse 111 06/19/19 0934  Resp 19 06/19/19 0934  SpO2 100 % 06/19/19 0934  Vitals shown include unvalidated device data.  Last Pain:  Vitals:   06/19/19 0628  TempSrc: Temporal  PainSc: 2       Patients Stated Pain Goal: 1 (06/19/19 4255)  Complications: No apparent anesthesia complications

## 2019-06-19 NOTE — Discharge Instructions (Signed)
Post Anesthesia Home Care Instructions  Activity: Get plenty of rest for the remainder of the day. A responsible individual must stay with you for 24 hours following the procedure.  For the next 24 hours, DO NOT: -Drive a car -Operate machinery -Drink alcoholic beverages -Take any medication unless instructed by your physician -Make any legal decisions or sign important papers.  Meals: Start with liquid foods such as gelatin or soup. Progress to regular foods as tolerated. Avoid greasy, spicy, heavy foods. If nausea and/or vomiting occur, drink only clear liquids until the nausea and/or vomiting subsides. Call your physician if vomiting continues.  Special Instructions/Symptoms: Your throat may feel dry or sore from the anesthesia or the breathing tube placed in your throat during surgery. If this causes discomfort, gargle with warm salt water. The discomfort should disappear within 24 hours.  If you had a scopolamine patch placed behind your ear for the management of post- operative nausea and/or vomiting:  1. The medication in the patch is effective for 72 hours, after which it should be removed.  Wrap patch in a tissue and discard in the trash. Wash hands thoroughly with soap and water. 2. You may remove the patch earlier than 72 hours if you experience unpleasant side effects which may include dry mouth, dizziness or visual disturbances. 3. Avoid touching the patch. Wash your hands with soap and water after contact with the patch.      Regional Anesthesia Blocks  1. Numbness or the inability to move the "blocked" extremity may last from 3-48 hours after placement. The length of time depends on the medication injected and your individual response to the medication. If the numbness is not going away after 48 hours, call your surgeon.  2. The extremity that is blocked will need to be protected until the numbness is gone and the  Strength has returned. Because you cannot feel it, you  will need to take extra care to avoid injury. Because it may be weak, you may have difficulty moving it or using it. You may not know what position it is in without looking at it while the block is in effect.  3. For blocks in the legs and feet, returning to weight bearing and walking needs to be done carefully. You will need to wait until the numbness is entirely gone and the strength has returned. You should be able to move your leg and foot normally before you try and bear weight or walk. You will need someone to be with you when you first try to ensure you do not fall and possibly risk injury.  4. Bruising and tenderness at the needle site are common side effects and will resolve in a few days.  5. Persistent numbness or new problems with movement should be communicated to the surgeon or the  Surgery Center ([phone])/ [facility] ([phone]).  Information for Discharge Teaching: EXPAREL (bupivacaine liposome injectable suspension)   Your surgeon or anesthesiologist gave you EXPAREL(bupivacaine) to help control your pain after surgery.   EXPAREL is a local anesthetic that provides pain relief by numbing the tissue around the surgical site.  EXPAREL is designed to release pain medication over time and can control pain for up to 72 hours.  Depending on how you respond to EXPAREL, you may require less pain medication during your recovery.  Possible side effects:  Temporary loss of sensation or ability to move in the area where bupivacaine was injected.  Nausea, vomiting, constipation  Rarely,   numbness and tingling in your mouth or lips, lightheadedness, or anxiety may occur.  Call your doctor right away if you think you may be experiencing any of these sensations, or if you have other questions regarding possible side effects.  Follow all other discharge instructions given to you by your surgeon or nurse. Eat a healthy diet and drink plenty of water or other  fluids.  If you return to the hospital for any reason within 96 hours following the administration of EXPAREL, it is important for health care providers to know that you have received this anesthetic. A teal colored band has been placed on your arm with the date, time and amount of EXPAREL you have received in order to alert and inform your health care providers. Please leave this armband in place for the full 96 hours following administration, and then you may remove the band. 

## 2019-06-19 NOTE — Anesthesia Postprocedure Evaluation (Signed)
Anesthesia Post Note  Patient: Riley Clarke  Procedure(s) Performed: LEFT SHOULDER ARTHROSCOPY, EXTENTSIVE DEBRIDEMENT, DISTAL CLAVICULECTOMY, SUBACROMIAL DECOMPRESSION, PARTIAL ACROMIOPLASTY WITH CORACOACROMIAL RELEASE, ROTATOR CUFFF REPAIR AND BICEP TENODESIS (Left Shoulder)     Patient location during evaluation: PACU Anesthesia Type: General Level of consciousness: awake and alert Pain management: pain level controlled Vital Signs Assessment: post-procedure vital signs reviewed and stable Respiratory status: spontaneous breathing, nonlabored ventilation, respiratory function stable and patient connected to nasal cannula oxygen Cardiovascular status: blood pressure returned to baseline and stable Postop Assessment: no apparent nausea or vomiting Anesthetic complications: no    Last Vitals:  Vitals:   06/19/19 1004 06/19/19 1011  BP: (!) 151/95   Pulse: (!) 108   Resp: 20   Temp:  36.5 C  SpO2: 95%     Last Pain:  Vitals:   06/19/19 1011  TempSrc: Oral  PainSc:                  Denelle Capurro

## 2019-06-19 NOTE — Interval H&P Note (Signed)
History and Physical Interval Note:  06/19/2019 7:12 AM  Riley Clarke  has presented today for surgery, with the diagnosis of LEFT SHOULDER OSTEOARTHRITIS, IMPINGEMENT SYNDROME, STRAIN OF MUSCLE AND TENDONS OF THE ROTATOR CUFF.  The various methods of treatment have been discussed with the patient and family. After consideration of risks, benefits and other options for treatment, the patient has consented to  Procedure(s): LEFT SHOULDER ARTHROSCOPY, EXTENTSIVE DEBRIDEMENT, DISTAL CLAVICULECTOMY, SUBACROMIAL DECOMPRESSION, PARTIAL ACROMIOPLASTY WITH CORACOACROMIAL RELEASE, ROTATOR CUFFF REPAIR AND BICEP TENODESIS (Left) as a surgical intervention.  The patient's history has been reviewed, patient examined, no change in status, stable for surgery.  I have reviewed the patient's chart and labs.  Questions were answered to the patient's satisfaction.     Bjorn Pippin

## 2019-06-20 ENCOUNTER — Encounter: Payer: Self-pay | Admitting: *Deleted

## 2019-07-09 LAB — HM DIABETES EYE EXAM

## 2019-07-10 ENCOUNTER — Ambulatory Visit (INDEPENDENT_AMBULATORY_CARE_PROVIDER_SITE_OTHER): Payer: Medicare Other | Admitting: Podiatry

## 2019-07-10 ENCOUNTER — Other Ambulatory Visit: Payer: Self-pay

## 2019-07-10 ENCOUNTER — Ambulatory Visit (INDEPENDENT_AMBULATORY_CARE_PROVIDER_SITE_OTHER): Payer: Medicare Other

## 2019-07-10 ENCOUNTER — Encounter: Payer: Self-pay | Admitting: Podiatry

## 2019-07-10 ENCOUNTER — Other Ambulatory Visit: Payer: Self-pay | Admitting: Podiatry

## 2019-07-10 VITALS — Temp 96.1°F

## 2019-07-10 DIAGNOSIS — M722 Plantar fascial fibromatosis: Secondary | ICD-10-CM

## 2019-07-10 DIAGNOSIS — M79671 Pain in right foot: Secondary | ICD-10-CM

## 2019-07-10 DIAGNOSIS — M79672 Pain in left foot: Secondary | ICD-10-CM

## 2019-07-11 NOTE — Progress Notes (Signed)
Subjective:   Patient ID: Riley Clarke, male   DOB: 64 y.o.   MRN: [id number]   HPI Patient states he had knee replacement and feels like he is walking differently on his arches and states he wants x-rays to check them and has pain in both heels which has become symptomatic recently   ROS      Objective:  Physical Exam  Patient has chronic plantar fasciitis who just underwent knee replacements with change in gait and is concerned about the effect on feet with discomfort in different areas     Assessment:  Acute plantar fasciitis bilateral with possibility for other pathology secondary to foot structure     Plan:  H&P x-rays reviewed and today I went ahead did sterile prep injected the fascia bilateral 3 mg Kenalog 5 mg Xylocaine advised to be seen back as needed and we will change his inserts to try to take pressure off his feet.  He will wear good shoes at the current time  X-rays indicate spur formation but did not indicate other changes with no stress fracture noted bilateral

## 2019-08-21 ENCOUNTER — Encounter: Payer: Self-pay | Admitting: Neurology

## 2019-08-27 ENCOUNTER — Encounter: Payer: Self-pay | Admitting: *Deleted

## 2019-09-05 LAB — COLOGUARD
COLOGUARD: NEGATIVE
Cologuard: NEGATIVE

## 2019-09-15 ENCOUNTER — Other Ambulatory Visit: Payer: Self-pay

## 2019-09-15 ENCOUNTER — Encounter: Payer: Self-pay | Admitting: Physical Therapy

## 2019-09-15 ENCOUNTER — Ambulatory Visit: Payer: Medicare Other | Attending: Orthopaedic Surgery | Admitting: Physical Therapy

## 2019-09-15 DIAGNOSIS — M25612 Stiffness of left shoulder, not elsewhere classified: Secondary | ICD-10-CM

## 2019-09-15 DIAGNOSIS — M25512 Pain in left shoulder: Secondary | ICD-10-CM | POA: Insufficient documentation

## 2019-09-15 DIAGNOSIS — M25611 Stiffness of right shoulder, not elsewhere classified: Secondary | ICD-10-CM | POA: Insufficient documentation

## 2019-09-15 DIAGNOSIS — M6281 Muscle weakness (generalized): Secondary | ICD-10-CM | POA: Diagnosis present

## 2019-09-15 DIAGNOSIS — M25511 Pain in right shoulder: Secondary | ICD-10-CM | POA: Insufficient documentation

## 2019-09-15 NOTE — Therapy (Signed)
[facility] [address] [place], [place], [address] Phone: [phone]   Fax:  [phone]  Physical Therapy Evaluation  Patient Details  Name: Riley Clarke MRN: [id number] Date of Birth: 1955/11/20 Referring Provider (PT): Ramond Marrow, MD   Encounter Date: 09/15/2019  PT End of Session - 09/15/19 1503    Visit Number  1    Number of Visits  12    Date for PT Re-Evaluation  10/27/19    Authorization Type  [employer] Medicare, medicaid secondary    PT Start Time  1418    PT Stop Time  1455    PT Time Calculation (min)  37 min    Activity Tolerance  Patient tolerated treatment well    Behavior During Therapy  [facility] for tasks assessed/performed       Past Medical History:  Diagnosis Date  . Arthritis   . Chronic back pain    on chronic opioids  . Diabetes mellitus without complication (HCC)   . GERD (gastroesophageal reflux disease)   . Headache(784.0)   . Hypertension    no meds    Past Surgical History:  Procedure Laterality Date  . BACK SURGERY    . CERVICAL DISC SURGERY Left 99  . ROTATOR CUFF REPAIR Bilateral   . SHOULDER ARTHROSCOPY WITH ROTATOR CUFF REPAIR AND SUBACROMIAL DECOMPRESSION Right 12/30/2018   Procedure: SHOULDER ARTHROSCOPY WITH ROTATOR CUFF REPAIR AND SUBACROMIAL DECOMPRESSION, DISTAL CLAVICLE EXICISION,;  Surgeon: Bjorn Pippin, MD;  Location: [facility];  Service: Orthopedics;  Laterality: Right;  . SHOULDER ARTHROSCOPY WITH SUBACROMIAL DECOMPRESSION, ROTATOR CUFF REPAIR AND BICEP TENDON REPAIR Left 06/19/2019   Procedure: LEFT SHOULDER ARTHROSCOPY, EXTENTSIVE DEBRIDEMENT, DISTAL CLAVICULECTOMY, SUBACROMIAL DECOMPRESSION, PARTIAL ACROMIOPLASTY WITH CORACOACROMIAL RELEASE, ROTATOR CUFFF REPAIR AND BICEP TENODESIS;  Surgeon: Bjorn Pippin, MD;  Location: [facility];  Service: Orthopedics;  Laterality: Left;    There were no vitals filed for this visit.   Subjective Assessment  - 09/15/19 1424    Subjective  Pt. underwent left shoulder rotator cuff repair 06/18/18  with biceps tenodesis, SAD, DCE secondary to prolonged history left shoulder pain symptoms with previous failed repair from surgery 2014. Pt. is also s/p right shoulder rotator cuff repair 12/30/18 with previous history right shoulder surgery 2015. Pt. cleared for AROM and isometrics progressing to resistance bands and light weights 1-5 lbs.    Pertinent History  diabetic, previous shoulder surgeries bilat., chronic back pain in pain management    Limitations  House hold activities;Lifting    Diagnostic tests  MRI, X-rays    Patient Stated Goals  Get shoulders better    Currently in Pain?  Yes    Pain Score  5     Pain Location  Shoulder    Pain Orientation  Left;Right    Pain Descriptors / Indicators  Sharp    Pain Type  Surgical pain;Chronic pain    Pain Onset  More than a month ago    Pain Frequency  Intermittent    Aggravating Factors   amr use, activity    Pain Relieving Factors  propping arm    Effect of Pain on Daily Activities  limits activity tolerance         [facility] PT Assessment - 09/15/19 0001      Assessment   Medical Diagnosis  s/p left and right rotator cuff repairs    Referring Provider (PT)  Ramond Marrow, MD    Onset Date/Surgical Date  --  left: 06/19/19, right 12/30/18   Hand Dominance  Right    Next MD Visit  --   pt. reports next follow up in 2-3 weeks   Prior Therapy  none for current surgeries      Precautions   Precaution Comments  see MD order, OK AROM and progress isometrics, resistance bands and weights 1-5 lbs.      Restrictions   Weight Bearing Restrictions  No      Balance Screen   Has the patient fallen in the past 6 months  No      Prior Function   Level of Independence  Independent with basic ADLs      Cognition   Overall Cognitive Status  Within Functional Limits for tasks assessed      Observation/Other Assessments   Focus on Therapeutic Outcomes (FOTO)    51% limited      ROM / Strength   AROM / PROM / Strength  AROM;PROM;Strength      AROM   AROM Assessment Site  Shoulder    Right/Left Shoulder  Right;Left    Right Shoulder Flexion  130 Degrees    Right Shoulder ABduction  110 Degrees    Right Shoulder Internal Rotation  60 Degrees    Right Shoulder External Rotation  35 Degrees   at 0 deg abduction   Left Shoulder Flexion  130 Degrees    Left Shoulder ABduction  80 Degrees    Left Shoulder Internal Rotation  40 Degrees    Left Shoulder External Rotation  30 Degrees   at 0 deg abd     PROM   PROM Assessment Site  Shoulder    Right/Left Shoulder  Right;Left    Right Shoulder Flexion  120 Degrees    Right Shoulder ABduction  120 Degrees    Right Shoulder Internal Rotation  60 Degrees    Right Shoulder External Rotation  60 Degrees    Left Shoulder Flexion  130 Degrees    Left Shoulder ABduction  120 Degrees    Left Shoulder Internal Rotation  40 Degrees    Left Shoulder External Rotation  65 Degrees      Strength   Overall Strength Comments  MMTs not tested given post-op status-will assess at future visits as appropriate                Objective measurements completed on examination: See above findings.      [facility] Treatment/Exercise - 09/15/19 0001      Exercises   Exercises  --   HEP instruction isometrics and wand flexion            PT Education - 09/15/19 1502    Education Details  HEP, restrictions/MD instructions, POC    Person(s) Educated  Patient    Methods  Explanation;Demonstration;Verbal cues;Handout    Comprehension  Verbalized understanding          PT Long Term Goals - 09/15/19 1546      PT LONG TERM GOAL #1   Title  Increase bilateral shoulder flexion and abduction AROM at least 10-20 deg to improve ability for overhead reaching for chores, improved ability shoulder use for grooming    Baseline  left: flex 130, abd 80, right: flex and abd 120 deg    Time  6     Period  Weeks    Status  New    Target Date  10/27/19      PT LONG TERM GOAL #2  Title  Improve FOTO outcome measure score to 35% or less impairment    Baseline  51% limited    Time  6    Period  Weeks    Status  New    Target Date  10/27/19      PT LONG TERM GOAL #3   Title  Increase bilateral shoulder strength to 4+/5 or greater all planes of ROM to improve functional abilities for activities such as chores, carrying groceries    Baseline  MMTs not tested at eval given status post-op massive cuff repair    Time  6    Period  Weeks    Status  New    Target Date  10/27/19      PT LONG TERM GOAL #4   Title  Independent with advanced HEP for progression of shoulder strengthening after d/c from formal therapy    Baseline  updates will be ongoing with progression per MD instructions    Time  6    Period  Weeks    Status  New    Target Date  10/27/19      PT LONG TERM GOAL #5   Title  Perform ADLs/IADLs with bilateral shoulder pain 3/10 or less at worst    Baseline  5/10    Time  6    Period  Weeks    Status  New    Target Date  10/27/19             Plan - 09/15/19 1456    Clinical Impression Statement  Pt. presents with bilateral shoulder pain with muscle weakness, decreased ROM s/p rotator cuff repairs. Mild stiffness limited end-ranges ROM but primary functional limitations associated with weakness and expect good progress with therapy to address associated functional limitations for shoulder/arm use for ADLs/IADLs.    Personal Factors and Comorbidities  Comorbidity 2;Other   multiple tx. areas with bilateral repairs   Comorbidities  diabetic, chronic pain history    Examination-Activity Limitations  Lift;Sleep;Dressing;Bathing;Reach Overhead    Examination-Participation Restrictions  Laundry;Cleaning    Stability/Clinical Decision Making  Stable/Uncomplicated    Clinical Decision Making  Low    Rehab Potential  Good    PT Frequency  2x / week    PT Duration  6  weeks    PT Treatment/Interventions  ADLs/Self Care Home Management;Cryotherapy;Electrical Stimulation;Moist Heat;Therapeutic exercise;Patient/family education;Vasopneumatic Device;Manual techniques;Passive range of motion;Dry needling;Neuromuscular re-education;Therapeutic activities;Taping    PT Next Visit Plan  review HEP as needed (see below), as tolerated progress light resistance bands and dumbbells, shoulder PROM for tightness as needed, modalities prn for pain    PT Home Exercise Plan  [id number]: shoulder isometrics for flexion, abd, ER, IR, extension (at neutral), supine wand flexion    Consulted and Agree with Plan of Care  Patient       Patient will benefit from skilled therapeutic intervention in order to improve the following deficits and impairments:  Pain, Impaired UE functional use, Decreased strength, Decreased range of motion  Visit Diagnosis: Left shoulder pain, unspecified chronicity  Right shoulder pain, unspecified chronicity  Stiffness of left shoulder, not elsewhere classified  Stiffness of right shoulder, not elsewhere classified  Muscle weakness (generalized)     Problem List Patient Active Problem List   Diagnosis Date Noted  . Diabetes (HCC) 09/12/2012  . Spinal stenosis, lumbar region, with neurogenic claudication 09/10/2012    Lazarus Gowda, PT, DPT 09/15/19 4:11 PM  [facility] [address]  [place], [place], [address] Phone: [phone]   Fax:  [phone]  Name: Riley Clarke MRN: [id number] Date of Birth: November 24, 1955

## 2019-09-16 DIAGNOSIS — R030 Elevated blood-pressure reading, without diagnosis of hypertension: Secondary | ICD-10-CM | POA: Insufficient documentation

## 2019-09-16 DIAGNOSIS — Z6830 Body mass index (BMI) 30.0-30.9, adult: Secondary | ICD-10-CM | POA: Insufficient documentation

## 2019-09-25 ENCOUNTER — Encounter: Payer: Medicare Other | Admitting: Physical Therapy

## 2019-10-02 ENCOUNTER — Ambulatory Visit (INDEPENDENT_AMBULATORY_CARE_PROVIDER_SITE_OTHER): Payer: Medicare Other | Admitting: Podiatry

## 2019-10-02 ENCOUNTER — Other Ambulatory Visit: Payer: Self-pay

## 2019-10-02 ENCOUNTER — Encounter: Payer: Self-pay | Admitting: Podiatry

## 2019-10-02 ENCOUNTER — Encounter: Payer: Medicare Other | Admitting: Physical Therapy

## 2019-10-02 DIAGNOSIS — M722 Plantar fascial fibromatosis: Secondary | ICD-10-CM

## 2019-10-02 NOTE — Progress Notes (Signed)
Subjective:   Patient ID: Riley Clarke, male   DOB: 64 y.o.   MRN: [id number]   HPI Patient states my heels have recently started to hurt again and I am very satisfied getting 3 to 4 months of relief and I do not have any other issues currently   ROS      Objective:  Physical Exam  Alert status intact with patient found to have inflammation of the plantar fascial medial band bilateral with discomfort upon palpation with long-term history of other medical problems F2     Assessment:  Acute plantar fasciitis bilateral with inflammation     Plan:  Sterile prep reinjected the fascia 3 mg Kenalog 5 mg Xylocaine discussed that he is not a surgical candidate and continue conservative treatment

## 2019-10-06 ENCOUNTER — Encounter: Payer: Medicare Other | Admitting: Physical Therapy

## 2019-10-08 ENCOUNTER — Encounter: Payer: Medicare Other | Admitting: Physical Therapy

## 2019-10-10 NOTE — Progress Notes (Signed)
NEUROLOGY CONSULTATION NOTE  Riley Clarke MRN: [id number] DOB: 02-27-1956  Referring provider: Hiram Gash, MD Primary care provider: No PCP  Reason for consult:  Left hand numbness  HISTORY OF PRESENT ILLNESS: Riley Clarke is a 64 year old right-handed black male with HTN, diabetes mellitus, arthritis, and chronic back pain who presents for left hand numbness.  History supplemented by referring provider notes.  He underwent left massive cuff repair on 06/19/2019 for impingement syndrome from osteoarthritis.  Following the surgery, his shoulder pain improved but he noted constant numbness and tingling in the tips of all his fingers and in the web between his thumb and index finger. He has trouble gripping with his left hand.  When he is in bed, the numbness in the tip of his fingers feels worse.  He notes shooting pain down the arm, sometimes the lateral arm, inner arm or whole arm and radiates into all of his fingers and thumb.  It is intermittent but not positional..  He previously had left shoulder rotator cuff repair in 2015, which was not successful.  In 2017, he was worked up for neck and left arm pain.  MRI of cervical spine from 03/02/2016 personally reviewed and demonstrated multilevel left sided foraminal narrowing potentially causing nerve root impingement at C3-4, C4-5 and C5-6.  03/02/2016 MRI CERVICAL SPINE WO:  "Alignment: Physiologic.   Vertebrae: No fracture, evidence of discitis, or bone lesion. Prior non-instrumented fusion C5-C6 appears solid.  Cord: Mild stenosis at C3-4 and C4-5.  No abnormal cord signal.  Posterior Fossa, vertebral arteries, paraspinal tissues: Flow voids are maintained. Possible thyromegaly, correlate with physical exam or ultrasound as clinically indicated. Mild tonsillar ectopia without Chiari I malformation or cervicomedullary compression.  Apparent medialization LEFT vocal cord could be artifactual.  Correlate clinically for hoarseness.  Disc  levels:  C2-3:  Annular bulge. No impingement.  C3-4: Central and leftward protrusion. LEFT greater than RIGHT uncinate spurring. LEFT greater than RIGHT facet arthropathy. Mild stenosis with effacement of the anterior subarachnoid space and minimal cord flattening. Canal diameter 7-8 mm. LEFT C4 nerve root impingement is likely.  C4-5: Annular bulge/ subligamentous protrusion Slight effacement anterior subarachnoid space. Slight LEFT-sided foraminal narrowing, related to disc material and asymmetric facet arthropathy. LEFT C5 nerve root impingement is possible.  C5-6: Solid arthrodesis. Significant residual uncinate spurring on the LEFT contributes to foraminal narrowing. LEFT C6 nerve root impingement is possible.  C6-7: Central protrusion. Marked anterior spurring projects into the retropharyngeal soft tissues.  Shallow posterior protrusion projects into the anterior subarachnoid space. No foraminal narrowing of significance.  C7-T1:  Normal disc space.  Mild facet disease.  No impingement.  IMPRESSION: Multiple areas of potentially symptomatic LEFT-sided neural impingement as described, including C3-4, C4-5, and C5-6. If clinically indicated, cervical myelogram and post myelogram CT could assess the relative contributions of each level with regard to nerve root cut off and bony overgrowth versus soft disc material.  Solid arthrodesis C5-6.  Possible thyromegaly."  PAST MEDICAL HISTORY: Past Medical History:  Diagnosis Date  . Arthritis   . Chronic back pain    on chronic opioids  . Diabetes mellitus without complication ([place])   . GERD (gastroesophageal reflux disease)   . Headache(784.0)   . Hypertension    no meds    PAST SURGICAL HISTORY: Past Surgical History:  Procedure Laterality Date  . BACK SURGERY    . CERVICAL DISC SURGERY Left 99  . ROTATOR CUFF REPAIR Bilateral   .  SHOULDER ARTHROSCOPY WITH ROTATOR CUFF REPAIR AND SUBACROMIAL DECOMPRESSION Right 12/30/2018   Procedure:  SHOULDER ARTHROSCOPY WITH ROTATOR CUFF REPAIR AND SUBACROMIAL DECOMPRESSION, DISTAL CLAVICLE EXICISION,;  Surgeon: Hiram Gash, MD;  Location: [facility];  Service: Orthopedics;  Laterality: Right;  . SHOULDER ARTHROSCOPY WITH SUBACROMIAL DECOMPRESSION, ROTATOR CUFF REPAIR AND BICEP TENDON REPAIR Left 06/19/2019   Procedure: LEFT SHOULDER ARTHROSCOPY, EXTENTSIVE DEBRIDEMENT, DISTAL CLAVICULECTOMY, SUBACROMIAL DECOMPRESSION, PARTIAL ACROMIOPLASTY WITH CORACOACROMIAL RELEASE, ROTATOR CUFFF REPAIR AND BICEP TENODESIS;  Surgeon: Hiram Gash, MD;  Location: [facility];  Service: Orthopedics;  Laterality: Left;    MEDICATIONS: Current Outpatient Medications on File Prior to Visit  Medication Sig Dispense Refill  . acetaminophen (TYLENOL) 325 MG tablet Take 2 tablets (650 mg total) by mouth every 8 (eight) hours. 100 tablet 2  . Dulaglutide (TRULICITY) 1.5 EH/2.0NO SOPN INJECT 0.5 MILLILITERS INTO THE SKIN ONCE A WEEK 2 mL 3  . esomeprazole (NEXIUM) 40 MG capsule TAKE ONE (1) CAPSULE BY MOUTH EVERY MORNING BEFORE BREAKFAST 90 capsule 1  . gabapentin (NEURONTIN) 300 MG capsule TAKE 1 CAPSULE BY MOUTH THREE TIMES A DAY 90 capsule 3  . glucose blood (ACCU-CHEK AVIVA) test strip Use as instructed 100 each 12  . JARDIANCE 25 MG TABS tablet TAKE ONE (1) TABLET BY MOUTH EVERY DAY 90 tablet 3  . linagliptin (TRADJENTA) 5 MG TABS tablet Take 1 tablet (5 mg total) by mouth daily. 30 tablet 3  . metFORMIN (GLUCOPHAGE) 1000 MG tablet TAKE 1 TABLET BY MOUTH 2 TIMES DAILY WITH A MEAL. 180 tablet 1  . NARCAN 4 MG/0.1ML LIQD nasal spray kit   0  . oxyCODONE-acetaminophen (PERCOCET) 10-325 MG tablet Take 1 tablet by mouth every 4 (four) hours as needed for pain.    . Vitamin D, Ergocalciferol, (DRISDOL) 50000 UNITS CAPS capsule Take 50,000 Units by mouth every 7 (seven) days. Reported on 07/05/2015     No current facility-administered medications on file prior to visit.    ALLERGIES:  Allergies  Allergen Reactions  . Shrimp [Shellfish Allergy]     intolerant  . Valium [Diazepam] Itching    FAMILY HISTORY: Family History  Problem Relation Age of Onset  . Heart disease Mother   . Diabetes Mother   . Amblyopia Neg Hx   . Blindness Neg Hx   . Cataracts Neg Hx   . Hypertension Neg Hx   . Macular degeneration Neg Hx   . Retinal detachment Neg Hx   . Strabismus Neg Hx    SOCIAL HISTORY: Social History   Socioeconomic History  . Marital status: Single    Spouse name: Not on file  . Number of children: Not on file  . Years of education: Not on file  . Highest education level: Not on file  Occupational History  . Not on file  Tobacco Use  . Smoking status: Current Some Day Smoker    Packs/day: 0.25    Years: 25.00    Pack years: 6.25    Types: Cigarettes  . Smokeless tobacco: Never Used  . Tobacco comment: 1 cig per day  Substance and Sexual Activity  . Alcohol use: Yes    Alcohol/week: 4.0 standard drinks    Types: 4 Cans of beer per week    Comment: twice a week  . Drug use: No  . Sexual activity: Not on file  Other Topics Concern  . Not on file  Social History Narrative  . Not on file  Social Determinants of Health   Financial Resource Strain:   . Difficulty of Paying Living Expenses:   Food Insecurity:   . Worried About Charity fundraiser in the Last Year:   . Arboriculturist in the Last Year:   Transportation Needs:   . Film/video editor (Medical):   Marland Kitchen Lack of Transportation (Non-Medical):   Physical Activity:   . Days of Exercise per Week:   . Minutes of Exercise per Session:   Stress:   . Feeling of Stress :   Social Connections:   . Frequency of Communication with Friends and Family:   . Frequency of Social Gatherings with Friends and Family:   . Attends Religious Services:   . Active Member of Clubs or Organizations:   . Attends Archivist Meetings:   Marland Kitchen Marital Status:   Intimate Partner Violence:   . Fear  of Current or Ex-Partner:   . Emotionally Abused:   Marland Kitchen Physically Abused:   . Sexually Abused:     REVIEW OF SYSTEMS: Constitutional: No fevers, chills, or sweats, no generalized fatigue, change in appetite Eyes: No visual changes, double vision, eye pain Ear, nose and throat: No hearing loss, ear pain, nasal congestion, sore throat Cardiovascular: No chest pain, palpitations Respiratory:  No shortness of breath at rest or with exertion, wheezes GastrointestinaI: No nausea, vomiting, diarrhea, abdominal pain, fecal incontinence Genitourinary:  No dysuria, urinary retention or frequency Musculoskeletal:  No neck pain, back pain Integumentary: No rash, pruritus, skin lesions Neurological: as above Psychiatric: No depression, insomnia, anxiety Endocrine: No palpitations, fatigue, diaphoresis, mood swings, change in appetite, change in weight, increased thirst Hematologic/Lymphatic:  No purpura, petechiae. Allergic/Immunologic: no itchy/runny eyes, nasal congestion, recent allergic reactions, rashes  PHYSICAL EXAM: Blood pressure (!) 151/73, pulse 70, height 6' (1.829 m), weight 230 lb 12.8 oz (104.7 kg), SpO2 98 %. General: No acute distress.  Patient appears well-groomed. Head:  Normocephalic/atraumatic Eyes:  fundi examined but not visualized Neck: supple, no paraspinal tenderness, full range of motion Back: No paraspinal tenderness Heart: regular rate and rhythm Lungs: Clear to auscultation bilaterally. Vascular: No carotid bruits. Neurological Exam: Mental status: alert and oriented to person, place, and time, recent and remote memory intact, fund of knowledge intact, attention and concentration intact, speech fluent and not dysarthric, language intact. Cranial nerves: CN I: not tested CN II: pupils equal, round and reactive to light, visual fields intact CN III, IV, VI:  full range of motion, no nystagmus, no ptosis CN V: facial sensation intact CN VII: upper and lower face  symmetric CN VIII: hearing intact CN IX, X: gag intact, uvula midline CN XI: sternocleidomastoid and trapezius muscles intact CN XII: tongue midline Bulk & Tone: normal, no fasciculations. Motor:  Bilateral 5th digit weakness in hands.  Otherwise, 5/5 throughout Sensation:  Pinprick sensation reduced in thumb, web between thumb and index finger, and index finger of left hand; vibration sensation intact. Deep Tendon Reflexes:  2+ throughout,  toes downgoing.  Finger to nose testing:  Without dysmetria.   Heel to shin:  Without dysmetria.   Gait:  Normal station and stride.  Able to turn and tandem walk. Romberg negative.  IMPRESSION: Left sided arm pain and numbness.  May be a neuropraxia from surgery.  He has history of cervical spine disease s/p surgery which may be recurrent and causing radiculopathy.  PLAN: 1.  NCV-EMG of left upper extremity.  My colleague, Dr. Posey Pronto, who performs nerve conduction studies in  out until August.  I offered to refer for NCV at another practice but he wishes to wait until Dr. Posey Pronto returns in August.  He will follow up with me in August after NCV-EMG.  Thank you for allowing me to take part in the care of this patient.  Metta Clines, DO  CC:  Ophelia Charter, MD

## 2019-10-13 ENCOUNTER — Other Ambulatory Visit: Payer: Self-pay

## 2019-10-13 ENCOUNTER — Encounter: Payer: Medicare Other | Admitting: Physical Therapy

## 2019-10-13 ENCOUNTER — Encounter: Payer: Self-pay | Admitting: Neurology

## 2019-10-13 ENCOUNTER — Ambulatory Visit (INDEPENDENT_AMBULATORY_CARE_PROVIDER_SITE_OTHER): Payer: Medicare Other | Admitting: Neurology

## 2019-10-13 VITALS — BP 151/73 | HR 70 | Ht 72.0 in | Wt 230.8 lb

## 2019-10-13 DIAGNOSIS — M5412 Radiculopathy, cervical region: Secondary | ICD-10-CM

## 2019-10-13 NOTE — Patient Instructions (Signed)
We will schedule you for a nerve conduction study of your left arm in August.  Follow up afterwards.

## 2019-10-15 ENCOUNTER — Encounter: Payer: Medicare Other | Admitting: Physical Therapy

## 2019-10-16 NOTE — Therapy (Signed)
[facility] [place], [place], [address] Phone: [phone]   Fax:  [phone]  Physical Therapy Evaluation/Discharge  Patient Details  Name: Riley Clarke MRN: [id number] Date of Birth: December 24, 1955 Referring Provider (PT): Ophelia Charter, MD   Encounter Date: 09/15/2019    Past Medical History:  Diagnosis Date  . Arthritis   . Chronic back pain    on chronic opioids  . Diabetes mellitus without complication ([place])   . GERD (gastroesophageal reflux disease)   . Headache(784.0)   . Hypertension    no meds    Past Surgical History:  Procedure Laterality Date  . BACK SURGERY    . CERVICAL DISC SURGERY Left 99  . ROTATOR CUFF REPAIR Bilateral   . SHOULDER ARTHROSCOPY WITH ROTATOR CUFF REPAIR AND SUBACROMIAL DECOMPRESSION Right 12/30/2018   Procedure: SHOULDER ARTHROSCOPY WITH ROTATOR CUFF REPAIR AND SUBACROMIAL DECOMPRESSION, DISTAL CLAVICLE EXICISION,;  Surgeon: Hiram Gash, MD;  Location: [facility];  Service: Orthopedics;  Laterality: Right;  . SHOULDER ARTHROSCOPY WITH SUBACROMIAL DECOMPRESSION, ROTATOR CUFF REPAIR AND BICEP TENDON REPAIR Left 06/19/2019   Procedure: LEFT SHOULDER ARTHROSCOPY, EXTENTSIVE DEBRIDEMENT, DISTAL CLAVICULECTOMY, SUBACROMIAL DECOMPRESSION, PARTIAL ACROMIOPLASTY WITH CORACOACROMIAL RELEASE, ROTATOR CUFFF REPAIR AND BICEP TENODESIS;  Surgeon: Hiram Gash, MD;  Location: [facility];  Service: Orthopedics;  Laterality: Left;    There were no vitals filed for this visit.                  Objective measurements completed on examination: See above findings.                   PT Long Term Goals - 09/15/19 1546      PT LONG TERM GOAL #1   Title  Increase bilateral shoulder flexion and abduction AROM at least 10-20 deg to improve ability for overhead reaching for chores, improved ability shoulder use for grooming    Baseline  left:  flex 130, abd 80, right: flex and abd 120 deg    Time  6    Period  Weeks    Status  New    Target Date  10/27/19      PT LONG TERM GOAL #2   Title  Improve FOTO outcome measure score to 35% or less impairment    Baseline  51% limited    Time  6    Period  Weeks    Status  New    Target Date  10/27/19      PT LONG TERM GOAL #3   Title  Increase bilateral shoulder strength to 4+/5 or greater all planes of ROM to improve functional abilities for activities such as chores, carrying groceries    Baseline  MMTs not tested at eval given status post-op massive cuff repair    Time  6    Period  Weeks    Status  New    Target Date  10/27/19      PT LONG TERM GOAL #4   Title  Independent with advanced HEP for progression of shoulder strengthening after d/c from formal therapy    Baseline  updates will be ongoing with progression per MD instructions    Time  6    Period  Weeks    Status  New    Target Date  10/27/19      PT LONG TERM GOAL #5   Title  Perform ADLs/IADLs with bilateral shoulder pain 3/10 or less at  worst    Baseline  5/10    Time  6    Period  Weeks    Status  New    Target Date  10/27/19               Patient will benefit from skilled therapeutic intervention in order to improve the following deficits and impairments:  Pain, Impaired UE functional use, Decreased strength, Decreased range of motion  Visit Diagnosis: Left shoulder pain, unspecified chronicity - Plan: PT plan of care cert/re-cert  Right shoulder pain, unspecified chronicity - Plan: PT plan of care cert/re-cert  Stiffness of left shoulder, not elsewhere classified - Plan: PT plan of care cert/re-cert  Stiffness of right shoulder, not elsewhere classified - Plan: PT plan of care cert/re-cert  Muscle weakness (generalized) - Plan: PT plan of care cert/re-cert     Problem List Patient Active Problem List   Diagnosis Date Noted  . Diabetes ([place]) 09/12/2012  . Spinal stenosis, lumbar  region, with neurogenic claudication 09/10/2012        PHYSICAL THERAPY DISCHARGE SUMMARY  Visits from Start of Care: 1  Current functional level related to goals / functional outcomes: Patient did not return for further therapy after initial evaluation 09/15/19. Current status unknown.   Remaining deficits: Current status unknown   Education / Equipment: HEP at eval Plan: Patient agrees to discharge.  Patient goals were not met. Patient is being discharged due to not returning since the last visit.  ?????         Riley Clarke, PT, DPT 10/16/19 12:58 PM    [facility] [address] [place], [place], [address] Phone: [phone]   Fax:  [phone]  Name: Riley Clarke MRN: [id number] Date of Birth: [birth year]

## 2019-12-22 ENCOUNTER — Ambulatory Visit: Payer: Medicare Other | Admitting: Podiatry

## 2019-12-24 ENCOUNTER — Encounter: Payer: Self-pay | Admitting: Podiatry

## 2019-12-24 ENCOUNTER — Ambulatory Visit (INDEPENDENT_AMBULATORY_CARE_PROVIDER_SITE_OTHER): Payer: Medicare Other | Admitting: Podiatry

## 2019-12-24 ENCOUNTER — Other Ambulatory Visit: Payer: Self-pay

## 2019-12-24 DIAGNOSIS — M722 Plantar fascial fibromatosis: Secondary | ICD-10-CM | POA: Diagnosis not present

## 2019-12-24 NOTE — Progress Notes (Signed)
Subjective:   Patient ID: Riley Clarke, male   DOB: 64 y.o.   MRN: [id number]   HPI Patient states the heels on both feet are killing him and he did really well to just started uppercase   ROS      Objective:  Physical Exam  Neuro vascular status intact with patient found to have exquisite discomfort plantar fascial bilateral heels     Assessment:  Acute plantar fasciitis bilateral     Plan:  Sterile prep and reinjected the fascia 3 mg Kenalog 5 mg Xylocaine at the site and sent to keep some good were doing this every 3-minute months as we will try and avoid surgery on this particular patient

## 2020-01-20 ENCOUNTER — Encounter: Payer: Medicare Other | Admitting: Neurology

## 2020-02-04 ENCOUNTER — Ambulatory Visit (INDEPENDENT_AMBULATORY_CARE_PROVIDER_SITE_OTHER): Payer: Medicare Other | Admitting: Neurology

## 2020-02-04 ENCOUNTER — Other Ambulatory Visit: Payer: Self-pay

## 2020-02-04 DIAGNOSIS — G5622 Lesion of ulnar nerve, left upper limb: Secondary | ICD-10-CM

## 2020-02-04 DIAGNOSIS — M5412 Radiculopathy, cervical region: Secondary | ICD-10-CM | POA: Diagnosis not present

## 2020-02-04 DIAGNOSIS — G5602 Carpal tunnel syndrome, left upper limb: Secondary | ICD-10-CM

## 2020-02-04 NOTE — Procedures (Signed)
[facility]  [address] [place], [address]  [place], [place] [address] Tel: [phone] Fax:  [phone] Test Date:  02/04/2020  Patient: Riley Clarke DOB: 1955-06-30 Physician: Nita Sickle, DO  Sex: Male Height: 6' " Ref Phys: Shon Millet, D.O.  ID#: [id number]   Technician:    Patient Complaints: This is a 64 year old man referred for evaluation of numbness and tingling of the left hand.  NCV & EMG Findings: Extensive electrodiagnostic testing of the left upper extremity shows:  1. Left mixed palmar and ulnar sensory responses show prolonged latency (2.1, 3.6 ms).  Left median sensory responses within normal limits.  2. Left median motor responses within normal limits.  Left ulnar motor response shows slowed conduction velocity across the elbow (A Elbow-B Elbow, 31 m/s).   3. Chronic motor axonal loss changes are seen affecting the left first dorsal interosseous and flexor carpi ulnaris muscles, without accompanied active denervation.    Impression: 1. Left ulnar neuropathy with slowing across the elbow, purely demyelinating, moderate. 2. Left median neuropathy at or distal to the wrist (very mild), consistent with a clinical diagnosis of carpal tunnel syndrome.     ___________________________ Nita Sickle, DO    Nerve Conduction Studies Anti Sensory Summary Table   Stim Site NR Peak (ms) Norm Peak (ms) P-T Amp (V) Norm P-T Amp  Left Median Anti Sensory (2nd Digit)  34C  Wrist    3.6 <3.8 12.0 >10  Left Ulnar Anti Sensory (5th Digit)  34C  Wrist    3.6 <3.2 8.7 >5   Motor Summary Table   Stim Site NR Onset (ms) Norm Onset (ms) O-P Amp (mV) Norm O-P Amp Site1 Site2 Delta-0 (ms) Dist (cm) Vel (m/s) Norm Vel (m/s)  Left Median Motor (Abd Poll Brev)  34C  Wrist    3.3 <4.0 9.4 >5 Elbow Wrist 5.8 29.0 50 >50  Elbow    9.1  8.2         Left Ulnar Motor (Abd Dig Minimi)  34C  Wrist    2.7 <3.1 7.7 >7 B Elbow Wrist 4.7 26.0 55 >50  B Elbow    7.4  7.0  A  Elbow B Elbow 3.2 10.0 31 >50  A Elbow    10.6  6.6          Comparison Summary Table   Stim Site NR Peak (ms) Norm Peak (ms) P-T Amp (V) Site1 Site2 Delta-P (ms) Norm Delta (ms)  Left Median/Ulnar Palm Comparison (Wrist - 8cm)  34C  Median Palm    2.2 <2.2 17.6 Median Palm Ulnar Palm    Ulnar Palm NR  <2.2        EMG   Side Muscle Ins Act Fibs Psw Fasc Number Recrt Dur Dur. Amp Amp. Poly Poly. Comment  Left 1stDorInt Nml Nml Nml Nml 1- Rapid Some 1+ Some 1+ Nml Nml N/A  Left Abd Poll Brev Nml Nml Nml Nml Nml Nml Nml Nml Nml Nml Nml Nml N/A  Left PronatorTeres Nml Nml Nml Nml Nml Nml Nml Nml Nml Nml Nml Nml N/A  Left Biceps Nml Nml Nml Nml Nml Nml Nml Nml Nml Nml Nml Nml N/A  Left Triceps Nml Nml Nml Nml Nml Nml Nml Nml Nml Nml Nml Nml N/A  Left Deltoid Nml Nml Nml Nml Nml Nml Nml Nml Nml Nml Nml Nml N/A  Left FlexCarpiUln Nml Nml Nml Nml 1- Rapid Some 1+ Few 1+ Nml Nml N/A      Waveforms:

## 2020-02-05 ENCOUNTER — Telehealth: Payer: Self-pay

## 2020-02-05 NOTE — Telephone Encounter (Signed)
-----   Message from Drema Dallas, DO sent at 02/05/2020  2:34 PM EDT ----- Nerve study does not reveal any evidence of nerve damage from the neck or shoulder region.  It does show evidence of carpal tunnel syndrome as well as pinched nerve at the elbow.  Based on his description, I suspect carpal tunnel syndrome.  I suggest getting a wrist splint to wear during the night and as much as possible during the day to see if symptoms improve.

## 2020-02-06 NOTE — Telephone Encounter (Signed)
Spoke to patient and informed him of his results. Patient asked where he could get a splint and I informed him [facility], [facility], [facility], [facility] might have some that would work for him. Patient verbalized understanding of results and recommendations.

## 2020-02-06 NOTE — Telephone Encounter (Signed)
Patient returned call to Mahina. 

## 2020-02-25 ENCOUNTER — Ambulatory Visit (INDEPENDENT_AMBULATORY_CARE_PROVIDER_SITE_OTHER): Payer: Medicare Other | Admitting: Podiatry

## 2020-02-25 ENCOUNTER — Encounter: Payer: Self-pay | Admitting: Podiatry

## 2020-02-25 ENCOUNTER — Other Ambulatory Visit: Payer: Self-pay

## 2020-02-25 DIAGNOSIS — M722 Plantar fascial fibromatosis: Secondary | ICD-10-CM | POA: Diagnosis not present

## 2020-02-25 NOTE — Progress Notes (Signed)
Subjective:   Patient ID: Riley Clarke, male   DOB: 64 y.o.   MRN: [id number]   HPI Patient states the heels on both my feet have been sore and making it hard for me to walk.  States that I was doing pretty good but it is been recently again bad and I know I cannot have surgery but I am desperate for relief   ROS      Objective:  Physical Exam  Neurovascular status intact negative Denna Haggard' sign noted with patient is always done well cortisone injection plantar heel with no other pathology     Assessment:  Chronic fasciitis bilateral with swelling     Plan:  Discussed other treatments and at this point we will continue conservative and I did do sterile prep and reinjected the fascia 3 mg Kenalog 5 mg Xylocaine again we want him to be approximately 3 to 4 months between treatments and he promises he will try to do everything else to lengthen the.  In between treatments

## 2020-03-12 ENCOUNTER — Other Ambulatory Visit: Payer: Self-pay | Admitting: Family Medicine

## 2020-06-25 ENCOUNTER — Ambulatory Visit (INDEPENDENT_AMBULATORY_CARE_PROVIDER_SITE_OTHER): Payer: Medicare Other | Admitting: Podiatry

## 2020-06-25 ENCOUNTER — Other Ambulatory Visit: Payer: Self-pay

## 2020-06-25 DIAGNOSIS — M722 Plantar fascial fibromatosis: Secondary | ICD-10-CM

## 2020-06-25 MED ORDER — TRIAMCINOLONE ACETONIDE 10 MG/ML IJ SUSP
10.0000 mg | Freq: Once | INTRAMUSCULAR | Status: AC
  Administered 2020-06-25: 10 mg

## 2020-06-28 NOTE — Progress Notes (Signed)
Subjective:   Patient ID: Riley Clarke, male   DOB: 65 y.o.   MRN: [id number]   HPI Patient states recently started develop discomfort in the bottom of my heels again   ROS      Objective:  Physical Exam  Neurovascular status intact with exquisite discomfort plantar heel region bilateral     Assessment:  Acute plantar fasciitis bilateral     Plan:  Sterile prep injected the fascia at insertion 3 mg Kenalog 5 mg Xylocaine continue conservative treatment and reappoint as needed

## 2020-07-08 ENCOUNTER — Ambulatory Visit: Payer: Medicare Other | Admitting: Nurse Practitioner

## 2020-07-23 ENCOUNTER — Other Ambulatory Visit: Payer: Self-pay

## 2020-07-23 ENCOUNTER — Ambulatory Visit (INDEPENDENT_AMBULATORY_CARE_PROVIDER_SITE_OTHER): Payer: Medicare Other | Admitting: Nurse Practitioner

## 2020-07-23 ENCOUNTER — Encounter: Payer: Self-pay | Admitting: Nurse Practitioner

## 2020-07-23 VITALS — BP 136/76 | HR 79 | Temp 97.5°F | Ht 72.0 in | Wt 235.0 lb

## 2020-07-23 DIAGNOSIS — E119 Type 2 diabetes mellitus without complications: Secondary | ICD-10-CM | POA: Diagnosis not present

## 2020-07-23 DIAGNOSIS — M48062 Spinal stenosis, lumbar region with neurogenic claudication: Secondary | ICD-10-CM | POA: Diagnosis not present

## 2020-07-23 DIAGNOSIS — Z7689 Persons encountering health services in other specified circumstances: Secondary | ICD-10-CM

## 2020-07-23 MED ORDER — SIMVASTATIN 10 MG PO TABS: 10.0000 mg | ORAL_TABLET | Freq: Every day | ORAL | 3 refills | Status: DC

## 2020-07-23 MED ORDER — LISINOPRIL-HYDROCHLOROTHIAZIDE 10-12.5 MG PO TABS: 1.0000 | ORAL_TABLET | Freq: Every day | ORAL | 3 refills | Status: DC

## 2020-07-23 NOTE — Progress Notes (Signed)
Established Patient Office Visit  Subjective:  Patient ID: Riley Clarke, male    DOB: [birth year]  Age: 65 y.o. MRN: [id number]  CC:  Chief Complaint  Patient presents with   New Patient (Initial Visit)    New patient  back lower  back , numbness in both legs , pt  wants a referral to pain magement    HPI Riley Clarke presents to establish care  Past Medical History:  Diagnosis Date   Arthritis    Chronic back pain    on chronic opioids   Diabetes mellitus without complication (HCC)    GERD (gastroesophageal reflux disease)    Headache(784.0)    Hypertension    no meds    Past Surgical History:  Procedure Laterality Date   BACK SURGERY     CERVICAL DISC SURGERY Left 99   ROTATOR CUFF REPAIR Bilateral    SHOULDER ARTHROSCOPY WITH ROTATOR CUFF REPAIR AND SUBACROMIAL DECOMPRESSION Right 12/30/2018   Procedure: SHOULDER ARTHROSCOPY WITH ROTATOR CUFF REPAIR AND SUBACROMIAL DECOMPRESSION, DISTAL CLAVICLE EXICISION,;  Surgeon: Hiram Gash, MD;  Location: [facility];  Service: Orthopedics;  Laterality: Right;   SHOULDER ARTHROSCOPY WITH SUBACROMIAL DECOMPRESSION, ROTATOR CUFF REPAIR AND BICEP TENDON REPAIR Left 06/19/2019   Procedure: LEFT SHOULDER ARTHROSCOPY, EXTENTSIVE DEBRIDEMENT, DISTAL CLAVICULECTOMY, SUBACROMIAL DECOMPRESSION, PARTIAL ACROMIOPLASTY WITH CORACOACROMIAL RELEASE, ROTATOR CUFFF REPAIR AND BICEP TENODESIS;  Surgeon: Hiram Gash, MD;  Location: [facility];  Service: Orthopedics;  Laterality: Left;    Family History  Problem Relation Age of Onset   Heart disease Mother    Diabetes Mother    Amblyopia Neg Hx    Blindness Neg Hx    Cataracts Neg Hx    Hypertension Neg Hx    Macular degeneration Neg Hx    Retinal detachment Neg Hx    Strabismus Neg Hx     Social History   Socioeconomic History   Marital status: Single    Spouse name: Not on file   Number of children: Not on file   Years of  education: Not on file   Highest education level: Not on file  Occupational History   Not on file  Tobacco Use   Smoking status: Former Smoker    Packs/day: 0.25    Years: 25.00    Pack years: 6.25    Types: Cigarettes   Smokeless tobacco: Never Used   Tobacco comment: 1 cig per day  Substance and Sexual Activity   Alcohol use: Not Currently    Alcohol/week: 4.0 standard drinks    Types: 4 Cans of beer per week    Comment: twice a week   Drug use: No   Sexual activity: Not on file  Other Topics Concern   Not on file  Social History Narrative   Right handed   Lives alone in a one story home   Social Determinants of Health   Financial Resource Strain: Not on file  Food Insecurity: Not on file  Transportation Needs: Not on file  Physical Activity: Not on file  Stress: Not on file  Social Connections: Not on file  Intimate Partner Violence: Not on file    Outpatient Medications Prior to Visit  Medication Sig Dispense Refill   acetaminophen (TYLENOL) 325 MG tablet Take 2 tablets (650 mg total) by mouth every 8 (eight) hours. 100 tablet 2   Dulaglutide (TRULICITY) 1.5 HW/8.0SU SOPN INJECT 0.5 MILLILITERS INTO THE SKIN ONCE A WEEK 2 mL 3  esomeprazole (NEXIUM) 40 MG capsule TAKE ONE (1) CAPSULE BY MOUTH EVERY MORNING BEFORE BREAKFAST 90 capsule 1   gabapentin (NEURONTIN) 300 MG capsule TAKE 1 CAPSULE BY MOUTH THREE TIMES A DAY 90 capsule 3   glucose blood (ACCU-CHEK AVIVA) test strip Use as instructed 100 each 12   JARDIANCE 25 MG TABS tablet TAKE ONE (1) TABLET BY MOUTH EVERY DAY 90 tablet 3   linagliptin (TRADJENTA) 5 MG TABS tablet Take 1 tablet (5 mg total) by mouth daily. 30 tablet 3   metFORMIN (GLUCOPHAGE) 1000 MG tablet TAKE 1 TABLET BY MOUTH 2 TIMES DAILY WITH A MEAL. 180 tablet 1   Vitamin D, Ergocalciferol, (DRISDOL) 50000 UNITS CAPS capsule Take 50,000 Units by mouth every 7 (seven) days. Reported on 07/05/2015     NARCAN 4 MG/0.1ML LIQD nasal  spray kit  (Patient not taking: Reported on 07/23/2020)  0   oxyCODONE-acetaminophen (PERCOCET) 10-325 MG tablet Take 1 tablet by mouth every 4 (four) hours as needed for pain.     No facility-administered medications prior to visit.    Allergies  Allergen Reactions   Shrimp [Shellfish Allergy]     intolerant   Valium [Diazepam] Itching    ROS Review of Systems    Objective:    Physical Exam Constitutional:      Appearance: Normal appearance. He is normal weight.  HENT:     Right Ear: Tympanic membrane normal.     Left Ear: Tympanic membrane normal.  Cardiovascular:     Rate and Rhythm: Normal rate and regular rhythm.     Pulses: Normal pulses.     Heart sounds: Normal heart sounds.  Pulmonary:     Effort: Pulmonary effort is normal. No respiratory distress.     Breath sounds: Normal breath sounds. No wheezing.  Abdominal:     General: Bowel sounds are normal.     Palpations: Abdomen is soft.     Tenderness: There is no abdominal tenderness.  Musculoskeletal:        General: No swelling. Normal range of motion.     Cervical back: Normal range of motion.     Right lower leg: No edema.     Left lower leg: No edema.     Right foot: Normal range of motion. No deformity, bunion or foot drop.     Left foot: Normal range of motion. No deformity, bunion or foot drop.       Feet:  Feet:     Right foot:     Protective Sensation: 10 sites sensed.     Skin integrity: Dry skin present.     Toenail Condition: Right toenails are normal.     Left foot:     Protective Sensation: 9 sites sensed.     Skin integrity: Callus and dry skin present.     Toenail Condition: Left toenails are normal.  Skin:    General: Skin is warm and dry.  Neurological:     General: No focal deficit present.     Mental Status: He is alert.  Psychiatric:        Mood and Affect: Mood normal.        Behavior: Behavior normal.     BP 136/76 (BP Location: Left Arm, Patient Position: Sitting, Cuff  Size: Large)    Pulse 79    Temp (!) 97.5 F (36.4 C) (Temporal)    Ht 6' (1.829 m)    Wt 235 lb (106.6 kg)    SpO2 97%  BMI 31.87 kg/m  Wt Readings from Last 3 Encounters:  07/23/20 235 lb (106.6 kg)  10/13/19 230 lb 12.8 oz (104.7 kg)  06/19/19 235 lb 3.7 oz (106.7 kg)     Health Maintenance Due  Topic Date Due   COVID-19 Vaccine (1) Never done   COLONOSCOPY (Pts 45-40yr Insurance coverage will need to be confirmed)  Never done   URINE MICROALBUMIN  07/25/2018   HEMOGLOBIN A1C  10/02/2018   OPHTHALMOLOGY EXAM  03/23/2019   INFLUENZA VACCINE  01/11/2020    There are no preventive care reminders to display for this patient.  Lab Results  Component Value Date   TSH 2.164 07/05/2015   Lab Results  Component Value Date   WBC 7.5 07/25/2017   HGB 14.7 07/25/2017   HCT 43.8 07/25/2017   MCV 90 07/25/2017   PLT 153 07/25/2017   Lab Results  Component Value Date   NA 138 06/17/2019   K 4.3 06/17/2019   CO2 21 (L) 06/17/2019   GLUCOSE 121 (H) 06/17/2019   BUN 6 (L) 06/17/2019   CREATININE 0.80 06/17/2019   BILITOT 0.3 07/25/2017   ALKPHOS 100 07/25/2017   AST 19 07/25/2017   ALT 24 07/25/2017   PROT 6.5 07/25/2017   ALBUMIN 4.1 07/25/2017   CALCIUM 9.0 06/17/2019   ANIONGAP 14 06/17/2019   Lab Results  Component Value Date   CHOL 135 07/25/2017   Lab Results  Component Value Date   HDL 68 07/25/2017   Lab Results  Component Value Date   LDLCALC 39 07/25/2017   Lab Results  Component Value Date   TRIG 139 07/25/2017   Lab Results  Component Value Date   CHOLHDL 2.0 07/25/2017   Lab Results  Component Value Date   HGBA1C 7.3 (A) 04/02/2018      Assessment & Plan:   Problem List Items Addressed This Visit      Endocrine   Diabetes [place]   Relevant Medications   lisinopril-hydrochlorothiazide (ZESTORETIC) 10-12.5 MG tablet   simvastatin (ZOCOR) 10 MG tablet   Other Relevant Orders   Microalbumin, urine   Comp. Metabolic  Panel (12)   Lipid panel     Other   Spinal stenosis, lumbar region, with neurogenic claudication   Relevant Orders   Ambulatory referral to Pain Clinic      Meds ordered this encounter  Medications   lisinopril-hydrochlorothiazide (ZESTORETIC) 10-12.5 MG tablet    Sig: Take 1 tablet by mouth daily.    Dispense:  90 tablet    Refill:  3    Order Specific Question:   Supervising Provider    Answer:   JTresa Garter[[[id number]]  simvastatin (ZOCOR) 10 MG tablet    Sig: Take 1 tablet (10 mg total) by mouth at bedtime.    Dispense:  90 tablet    Refill:  3    Order Specific Question:   Supervising Provider    Answer:   JTresa Garter[[id number]   Follow-up: Return in about 3 months (around 10/20/2020) for follow up [id number].    AHermina Staggers RN

## 2020-07-23 NOTE — Progress Notes (Signed)
[place], [place]  [address] Phone:  [phone]   Fax:  [phone]   New Patient Office Visit  Subjective:  Patient ID: Riley Clarke, male    DOB: [birth year]  Age: 65 y.o. MRN: [id number]  CC:  Chief Complaint  Patient presents with  . New Patient (Initial Visit)    New patient  back lower  back , numbness in both legs , pt  wants a referral to pain magement    HPI Zarin Knupp Donnellan presents to establish care. He  has a past medical history of Arthritis, Chronic back pain, Diabetes mellitus without complication ([place]), GERD (gastroesophageal reflux disease), Headache(784.0), and Hypertension.   Back Pain Patient presents for evaluation of whole back problems. Symptoms have been present for 3 years.  and include numbness in middle back, pain in whole back  (sharp and shooting in character; 7/10 in severity) and paresthesias in middle back. Initial inciting event: none. Symptoms are worse in the morning, in the middle of the day and in the afternoon. Alleviating factors identifiable by the patient are sleeping . Aggravating factors identifiable by the patient are any activity. Treatments initiated by the patient: none. Previous lower back problems: none. Previous work up: SP lumbar fusion L5-6 possible S1. Previous treatments: interventional therapy.    Diabetes Mellitus Patient presents for follow up of diabetes. Current symptoms include: paresthesia of the feet. Symptoms have stabilized. Patient denies foot ulcerations, hypoglycemia , increased appetite, nausea, polydipsia, polyuria, visual disturbances, vomiting and weight loss. Evaluation to date has included: fasting blood sugar and hemoglobin A1C.  Current treatment: Continued sulfonylurea which has been effective, Continued metformin which has been effective and Continued ACE inhibitor/ARB which has been effective. Last dilated eye exam: 2020. He is currently being treated at [facility].  Past Medical History:  Diagnosis Date  . Arthritis   . Chronic back pain    on chronic opioids  . Diabetes mellitus without complication ([place])   . GERD (gastroesophageal reflux disease)   . Headache(784.0)   . Hypertension    no meds    Past Surgical History:  Procedure Laterality Date  . BACK SURGERY    . CERVICAL DISC SURGERY Left 99  . ROTATOR CUFF REPAIR Bilateral   . SHOULDER ARTHROSCOPY WITH ROTATOR CUFF REPAIR AND SUBACROMIAL DECOMPRESSION Right 12/30/2018   Procedure: SHOULDER ARTHROSCOPY WITH ROTATOR CUFF REPAIR AND SUBACROMIAL DECOMPRESSION, DISTAL CLAVICLE EXICISION,;  Surgeon: Hiram Gash, MD;  Location: [facility];  Service: Orthopedics;  Laterality: Right;  . SHOULDER ARTHROSCOPY WITH SUBACROMIAL DECOMPRESSION, ROTATOR CUFF REPAIR AND BICEP TENDON REPAIR Left 06/19/2019   Procedure: LEFT SHOULDER ARTHROSCOPY, EXTENTSIVE DEBRIDEMENT, DISTAL CLAVICULECTOMY, SUBACROMIAL DECOMPRESSION, PARTIAL ACROMIOPLASTY WITH CORACOACROMIAL RELEASE, ROTATOR CUFFF REPAIR AND BICEP TENODESIS;  Surgeon: Hiram Gash, MD;  Location: [facility];  Service: Orthopedics;  Laterality: Left;    Family History  Problem Relation Age of Onset  . Heart disease Mother   . Diabetes Mother   . Amblyopia Neg Hx   . Blindness Neg Hx   . Cataracts Neg Hx   . Hypertension Neg Hx   . Macular degeneration Neg Hx   . Retinal detachment Neg Hx   . Strabismus Neg Hx     Social History   Socioeconomic History  . Marital status: Single    Spouse name: Not on file  . Number of children: Not on file  . Years of education: Not  on file  . Highest education level: Not on file  Occupational History  . Not on file  Tobacco Use  . Smoking status: Former Smoker    Packs/day: 0.25    Years: 25.00    Pack years: 6.25    Types: Cigarettes  . Smokeless tobacco: Never Used  . Tobacco comment: 1 cig per day  Substance and Sexual Activity  . Alcohol use: Not Currently     Alcohol/week: 4.0 standard drinks    Types: 4 Cans of beer per week    Comment: twice a week  . Drug use: No  . Sexual activity: Not on file  Other Topics Concern  . Not on file  Social History Narrative   Right handed   Lives alone in a one story home   Social Determinants of Health   Financial Resource Strain: Not on file  Food Insecurity: Not on file  Transportation Needs: Not on file  Physical Activity: Not on file  Stress: Not on file  Social Connections: Not on file  Intimate Partner Violence: Not on file    ROS Review of Systems  Objective:   Today's Vitals: BP 136/76 (BP Location: Left Arm, Patient Position: Sitting, Cuff Size: Large)   Pulse 79   Temp (!) 97.5 F (36.4 C) (Temporal)   Ht 6' (1.829 m)   Wt 235 lb (106.6 kg)   SpO2 97%   BMI 31.87 kg/m   Physical Exam Constitutional:      Appearance: He is obese.  HENT:     Head: Normocephalic and atraumatic.     Right Ear: Tympanic membrane normal.     Left Ear: Tympanic membrane normal.     Nose: Nose normal.     Mouth/Throat:     Mouth: Mucous membranes are moist.  Cardiovascular:     Rate and Rhythm: Normal rate and regular rhythm.     Pulses: Normal pulses.     Heart sounds: Murmur heard.    Pulmonary:     Effort: Pulmonary effort is normal.     Breath sounds: Normal breath sounds.  Abdominal:     General: Bowel sounds are normal.     Palpations: Abdomen is soft.     Comments: Increased abdominal girth   Musculoskeletal:        General: Normal range of motion.     Cervical back: Normal range of motion.     Right lower leg: No edema.     Left lower leg: No edema.  Skin:    General: Skin is warm and dry.     Capillary Refill: Capillary refill takes less than 2 seconds.  Neurological:     General: No focal deficit present.     Mental Status: He is alert and oriented to person, place, and time.  Psychiatric:        Mood and Affect: Mood normal.        Behavior: Behavior normal.         Thought Content: Thought content normal.        Judgment: Judgment normal.     Assessment & Plan:   Problem List Items Addressed This Visit      Endocrine   Diabetes ([place]) Encourage compliance with current treatment regimen  Dose adjustment Simvastatin added Encourage regular CBG monitoring Encourage contacting office if excessive hyperglycemia and or hypoglycemia Lifestyle modification with healthy diet (fewer calories, more high fiber foods, whole grains and non-starchy vegetables, lower fat meat and fish, low-fat diary  include healthy oils) regular exercise (physical activity) and weight loss Opthalmology exam discussed  Nutritional consult recommended Regular dental visits encouraged Home BP monitoring also encouraged goal <130/80     Relevant Medications   lisinopril-hydrochlorothiazide (ZESTORETIC) 10-12.5 MG tablet   simvastatin (ZOCOR) 10 MG tablet   Other Relevant Orders   Microalbumin, urine   Comp. Metabolic Panel (12)   Lipid panel     Other   Spinal stenosis, lumbar region, with neurogenic claudication   Relevant Orders   Ambulatory referral to Pain Clinic    Other Visit Diagnoses    Encounter to establish care    -  Primary Discussed male health maintenance; self exams of breast and testicles  and annual exams. Discussed prostate age related concerns Discussed general safety in vehicle and COVID Discussed regular hydration with water Discussed healthy diet and exercise and weight management Discussed mental health Encouraged to call our office for an appointment with in ongoing concerns for questions.         Outpatient Encounter Medications as of 07/23/2020  Medication Sig  . acetaminophen (TYLENOL) 325 MG tablet Take 2 tablets (650 mg total) by mouth every 8 (eight) hours.  . Dulaglutide (TRULICITY) 1.5 QO/3.0OB SOPN INJECT 0.5 MILLILITERS INTO THE SKIN ONCE A WEEK  . esomeprazole (NEXIUM) 40 MG capsule TAKE ONE (1) CAPSULE BY MOUTH EVERY MORNING  BEFORE BREAKFAST  . gabapentin (NEURONTIN) 300 MG capsule TAKE 1 CAPSULE BY MOUTH THREE TIMES A DAY  . glucose blood (ACCU-CHEK AVIVA) test strip Use as instructed  . JARDIANCE 25 MG TABS tablet TAKE ONE (1) TABLET BY MOUTH EVERY DAY  . linagliptin (TRADJENTA) 5 MG TABS tablet Take 1 tablet (5 mg total) by mouth daily.  [place] Kitchen lisinopril-hydrochlorothiazide (ZESTORETIC) 10-12.5 MG tablet Take 1 tablet by mouth daily.  . metFORMIN (GLUCOPHAGE) 1000 MG tablet TAKE 1 TABLET BY MOUTH 2 TIMES DAILY WITH A MEAL.  . simvastatin (ZOCOR) 10 MG tablet Take 1 tablet (10 mg total) by mouth at bedtime.  . Vitamin D, Ergocalciferol, (DRISDOL) 50000 UNITS CAPS capsule Take 50,000 Units by mouth every 7 (seven) days. Reported on 07/05/2015  . NARCAN 4 MG/0.1ML LIQD nasal spray kit  (Patient not taking: Reported on 07/23/2020)  . [DISCONTINUED] oxyCODONE-acetaminophen (PERCOCET) 10-325 MG tablet Take 1 tablet by mouth every 4 (four) hours as needed for pain.   No facility-administered encounter medications on file as of 07/23/2020.    Follow-up: Return in about 3 months (around 10/20/2020) for follow up [id number].   Vevelyn Francois, NP

## 2020-07-23 NOTE — Patient Instructions (Signed)
Diabetes Mellitus and Nutrition, Adult When you have diabetes, or diabetes mellitus, it is very important to have healthy eating habits because your blood sugar (glucose) levels are greatly affected by what you eat and drink. Eating healthy foods in the right amounts, at about the same times every day, can help you:  Control your blood glucose.  Lower your risk of heart disease.  Improve your blood pressure.  Reach or maintain a healthy weight. What can affect my meal plan? Every person with diabetes is different, and each person has different needs for a meal plan. Your health care provider may recommend that you work with a dietitian to make a meal plan that is best for you. Your meal plan may vary depending on factors such as:  The calories you need.  The medicines you take.  Your weight.  Your blood glucose, blood pressure, and cholesterol levels.  Your activity level.  Other health conditions you have, such as heart or kidney disease. How do carbohydrates affect me? Carbohydrates, also called carbs, affect your blood glucose level more than any other type of food. Eating carbs naturally raises the amount of glucose in your blood. Carb counting is a method for keeping track of how many carbs you eat. Counting carbs is important to keep your blood glucose at a healthy level, especially if you use insulin or take certain oral diabetes medicines. It is important to know how many carbs you can safely have in each meal. This is different for every person. Your dietitian can help you calculate how many carbs you should have at each meal and for each snack. How does alcohol affect me? Alcohol can cause a sudden decrease in blood glucose (hypoglycemia), especially if you use insulin or take certain oral diabetes medicines. Hypoglycemia can be a life-threatening condition. Symptoms of hypoglycemia, such as sleepiness, dizziness, and confusion, are similar to symptoms of having too much  alcohol.  Do not drink alcohol if: ? Your health care provider tells you not to drink. ? You are pregnant, may be pregnant, or are planning to become pregnant.  If you drink alcohol: ? Do not drink on an empty stomach. ? Limit how much you use to:  0-1 drink a day for women.  0-2 drinks a day for men. ? Be aware of how much alcohol is in your drink. In the [place] one drink equals one 12 oz bottle of beer (355 mL), one 5 oz glass of wine (148 mL), or one 1 oz glass of hard liquor (44 mL). ? Keep yourself hydrated with water, diet soda, or unsweetened iced tea.  Keep in mind that regular soda, juice, and other mixers may contain a lot of sugar and must be counted as carbs. What are tips for following this plan? Reading food labels  Start by checking the serving size on the "Nutrition Facts" label of packaged foods and drinks. The amount of calories, carbs, fats, and other nutrients listed on the label is based on one serving of the item. Many items contain more than one serving per package.  Check the total grams (g) of carbs in one serving. You can calculate the number of servings of carbs in one serving by dividing the total carbs by 15. For example, if a food has 30 g of total carbs per serving, it would be equal to 2 servings of carbs.  Check the number of grams (g) of saturated fats and trans fats in one serving. Choose foods that have   a low amount or none of these fats.  Check the number of milligrams (mg) of salt (sodium) in one serving. Most people should limit total sodium intake to less than 2,300 mg per day.  Always check the nutrition information of foods labeled as "low-fat" or "nonfat." These foods may be higher in added sugar or refined carbs and should be avoided.  Talk to your dietitian to identify your daily goals for nutrients listed on the label. Shopping  Avoid buying canned, pre-made, or processed foods. These foods tend to be high in fat, sodium, and added  sugar.  Shop around the outside edge of the grocery store. This is where you will most often find fresh fruits and vegetables, bulk grains, fresh meats, and fresh dairy. Cooking  Use low-heat cooking methods, such as baking, instead of high-heat cooking methods like deep frying.  Cook using healthy oils, such as olive, canola, or sunflower oil.  Avoid cooking with butter, cream, or high-fat meats. Meal planning  Eat meals and snacks regularly, preferably at the same times every day. Avoid going long periods of time without eating.  Eat foods that are high in fiber, such as fresh fruits, vegetables, beans, and whole grains. Talk with your dietitian about how many servings of carbs you can eat at each meal.  Eat 4-6 oz (112-168 g) of lean protein each day, such as lean meat, chicken, fish, eggs, or tofu. One ounce (oz) of lean protein is equal to: ? 1 oz (28 g) of meat, chicken, or fish. ? 1 egg. ?  cup (62 g) of tofu.  Eat some foods each day that contain healthy fats, such as avocado, nuts, seeds, and fish.   What foods should I eat? Fruits Berries. Apples. Oranges. Peaches. Apricots. Plums. Grapes. Mango. Papaya. Pomegranate. Kiwi. Cherries. Vegetables Lettuce. Spinach. Leafy greens, including kale, chard, collard greens, and mustard greens. Beets. Cauliflower. Cabbage. Broccoli. Carrots. Green beans. Tomatoes. Peppers. Onions. Cucumbers. Brussels sprouts. Grains Whole grains, such as whole-wheat or whole-grain bread, crackers, tortillas, cereal, and pasta. Unsweetened oatmeal. Quinoa. Brown or wild rice. Meats and other proteins Seafood. Poultry without skin. Lean cuts of poultry and beef. Tofu. Nuts. Seeds. Dairy Low-fat or fat-free dairy products such as milk, yogurt, and cheese. The items listed above may not be a complete list of foods and beverages you can eat. Contact a dietitian for more information. What foods should I avoid? Fruits Fruits canned with  syrup. Vegetables Canned vegetables. Frozen vegetables with butter or cream sauce. Grains Refined white flour and flour products such as bread, pasta, snack foods, and cereals. Avoid all processed foods. Meats and other proteins Fatty cuts of meat. Poultry with skin. Breaded or fried meats. Processed meat. Avoid saturated fats. Dairy Full-fat yogurt, cheese, or milk. Beverages Sweetened drinks, such as soda or iced tea. The items listed above may not be a complete list of foods and beverages you should avoid. Contact a dietitian for more information. Questions to ask a health care provider  Do I need to meet with a diabetes educator?  Do I need to meet with a dietitian?  What number can I call if I have questions?  When are the best times to check my blood glucose? Where to find more information:  American Diabetes Association: diabetes.org  Academy of Nutrition and Dietetics: www.eatright.org  National Institute of Diabetes and Digestive and Kidney Diseases: www.niddk.nih.gov  Association of Diabetes Care and Education Specialists: www.diabeteseducator.org Summary  It is important to have healthy eating   habits because your blood sugar (glucose) levels are greatly affected by what you eat and drink.  A healthy meal plan will help you control your blood glucose and maintain a healthy lifestyle.  Your health care provider may recommend that you work with a dietitian to make a meal plan that is best for you.  Keep in mind that carbohydrates (carbs) and alcohol have immediate effects on your blood glucose levels. It is important to count carbs and to use alcohol carefully. This information is not intended to replace advice given to you by your health care provider. Make sure you discuss any questions you have with your health care provider. Document Revised: 05/06/2019 Document Reviewed: 05/06/2019 Elsevier Patient Education  2021 Elsevier Inc.  

## 2020-07-26 ENCOUNTER — Other Ambulatory Visit: Payer: Self-pay

## 2020-07-26 ENCOUNTER — Other Ambulatory Visit: Payer: Medicare Other

## 2020-07-26 DIAGNOSIS — E119 Type 2 diabetes mellitus without complications: Secondary | ICD-10-CM

## 2020-07-27 LAB — COMP. METABOLIC PANEL (12)
AST: 21 IU/L (ref 0–40)
Albumin/Globulin Ratio: 1.7 (ref 1.2–2.2)
Albumin: 4.5 g/dL (ref 3.8–4.8)
Alkaline Phosphatase: 119 IU/L (ref 44–121)
BUN/Creatinine Ratio: 12 (ref 10–24)
BUN: 11 mg/dL (ref 8–27)
Bilirubin Total: 0.3 mg/dL (ref 0.0–1.2)
Calcium: 9.5 mg/dL (ref 8.6–10.2)
Chloride: 96 mmol/L (ref 96–106)
Creatinine, Ser: 0.9 mg/dL (ref 0.76–1.27)
GFR calc Af Amer: 105 mL/min/{1.73_m2} (ref >59)
GFR calc non Af Amer: 91 mL/min/{1.73_m2} (ref >59)
Globulin, Total: 2.6 g/dL (ref 1.5–4.5)
Glucose: 138 mg/dL — ABNORMAL HIGH (ref 65–99)
Potassium: 4.7 mmol/L (ref 3.5–5.2)
Sodium: 137 mmol/L (ref 134–144)
Total Protein: 7.1 g/dL (ref 6.0–8.5)

## 2020-07-27 LAB — LIPID PANEL
Chol/HDL Ratio: 2.1 ratio (ref 0.0–5.0)
Cholesterol, Total: 133 mg/dL (ref 100–199)
HDL: 63 mg/dL (ref >39)
LDL Chol Calc (NIH): 51 mg/dL (ref 0–99)
Triglycerides: 105 mg/dL (ref 0–149)
VLDL Cholesterol Cal: 19 mg/dL (ref 5–40)

## 2020-07-29 ENCOUNTER — Encounter: Payer: Self-pay | Admitting: Nurse Practitioner

## 2020-07-29 ENCOUNTER — Telehealth: Payer: Self-pay | Admitting: Nurse Practitioner

## 2020-07-29 ENCOUNTER — Other Ambulatory Visit: Payer: Self-pay | Admitting: Nurse Practitioner

## 2020-07-29 DIAGNOSIS — Z8711 Personal history of peptic ulcer disease: Secondary | ICD-10-CM

## 2020-07-29 NOTE — Telephone Encounter (Signed)
Pt asking where he can be referred to for ulcer in stomach. Pt ph [phone].

## 2020-07-29 NOTE — Telephone Encounter (Signed)
Order placed

## 2020-08-09 ENCOUNTER — Encounter: Payer: Self-pay | Admitting: Nurse Practitioner

## 2020-08-12 ENCOUNTER — Ambulatory Visit (INDEPENDENT_AMBULATORY_CARE_PROVIDER_SITE_OTHER): Payer: Medicare Other

## 2020-08-12 ENCOUNTER — Ambulatory Visit: Payer: Medicare Other

## 2020-08-12 ENCOUNTER — Ambulatory Visit (INDEPENDENT_AMBULATORY_CARE_PROVIDER_SITE_OTHER): Payer: Medicare Other | Admitting: Podiatry

## 2020-08-12 ENCOUNTER — Other Ambulatory Visit: Payer: Self-pay

## 2020-08-12 ENCOUNTER — Encounter: Payer: Self-pay | Admitting: Podiatry

## 2020-08-12 DIAGNOSIS — M79671 Pain in right foot: Secondary | ICD-10-CM

## 2020-08-12 DIAGNOSIS — M79672 Pain in left foot: Secondary | ICD-10-CM | POA: Diagnosis not present

## 2020-08-12 DIAGNOSIS — M722 Plantar fascial fibromatosis: Secondary | ICD-10-CM

## 2020-08-12 NOTE — Progress Notes (Signed)
Subjective:   Patient ID: Riley Clarke, male   DOB: 65 y.o.   MRN: [id number]   HPI Patient presents stating that his heels are doing pretty well but has developed some nodules more in the front part of his feet and states the one on his right foot had become extremely sore.  Does not remember specific injury or other pathology   ROS      Objective:  Physical Exam  Neurovascular status intact with patient's plantar heels at this point doing well with minimal discomfort but has a lot of discomfort with moderate nodule formation distal into the fascia as it inserts especially around the head of the first metatarsal bilateral.  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  Do not recommend anything at this point to shrink but I did recommend utilizing hot compresses topical medicines and support therapy and if symptoms persist we may have to consider more aggressive treatment with possible resection that I educated him on today and spent time explaining what would be required.  Reappoint as needed for this or reoccurrence of plantar fascial inflammation  X-rays were negative for sign of bony changes calcification with heel spurs noted bilateral

## 2020-08-24 ENCOUNTER — Encounter: Payer: Self-pay | Admitting: Nurse Practitioner

## 2020-08-24 ENCOUNTER — Other Ambulatory Visit (INDEPENDENT_AMBULATORY_CARE_PROVIDER_SITE_OTHER): Payer: Medicare Other

## 2020-08-24 ENCOUNTER — Ambulatory Visit (INDEPENDENT_AMBULATORY_CARE_PROVIDER_SITE_OTHER): Payer: Medicare Other | Admitting: Nurse Practitioner

## 2020-08-24 VITALS — BP 120/70 | HR 76 | Ht 71.5 in | Wt 233.5 lb

## 2020-08-24 DIAGNOSIS — K5900 Constipation, unspecified: Secondary | ICD-10-CM

## 2020-08-24 DIAGNOSIS — M6208 Separation of muscle (nontraumatic), other site: Secondary | ICD-10-CM | POA: Diagnosis not present

## 2020-08-24 DIAGNOSIS — R140 Abdominal distension (gaseous): Secondary | ICD-10-CM | POA: Insufficient documentation

## 2020-08-24 LAB — COMPREHENSIVE METABOLIC PANEL
ALT: 21 U/L (ref 0–53)
AST: 18 U/L (ref 0–37)
Albumin: 4.4 g/dL (ref 3.5–5.2)
Alkaline Phosphatase: 87 U/L (ref 39–117)
BUN: 10 mg/dL (ref 6–23)
CO2: 28 mEq/L (ref 19–32)
Calcium: 9.5 mg/dL (ref 8.4–10.5)
Chloride: 99 mEq/L (ref 96–112)
Creatinine, Ser: 0.84 mg/dL (ref 0.40–1.50)
GFR: 93.22 mL/min (ref >60.00)
Glucose, Bld: 132 mg/dL — ABNORMAL HIGH (ref 70–99)
Potassium: 4.1 mEq/L (ref 3.5–5.1)
Sodium: 136 mEq/L (ref 135–145)
Total Bilirubin: 0.3 mg/dL (ref 0.2–1.2)
Total Protein: 7.5 g/dL (ref 6.0–8.3)

## 2020-08-24 LAB — CBC WITH DIFFERENTIAL/PLATELET
Basophils Absolute: 0.1 10*3/uL (ref 0.0–0.1)
Basophils Relative: 1 % (ref 0.0–3.0)
Eosinophils Absolute: 0.2 10*3/uL (ref 0.0–0.7)
Eosinophils Relative: 3 % (ref 0.0–5.0)
HCT: 43 % (ref 39.0–52.0)
Hemoglobin: 14.5 g/dL (ref 13.0–17.0)
Lymphocytes Relative: 24.3 % (ref 12.0–46.0)
Lymphs Abs: 1.7 10*3/uL (ref 0.7–4.0)
MCHC: 33.7 g/dL (ref 30.0–36.0)
MCV: 86.3 fl (ref 78.0–100.0)
Monocytes Absolute: 0.7 10*3/uL (ref 0.1–1.0)
Monocytes Relative: 9.1 % (ref 3.0–12.0)
Neutro Abs: 4.5 10*3/uL (ref 1.4–7.7)
Neutrophils Relative %: 62.6 % (ref 43.0–77.0)
Platelets: 211 10*3/uL (ref 150.0–400.0)
RBC: 4.99 Mil/uL (ref 4.22–5.81)
RDW: 13.7 % (ref 11.5–15.5)
WBC: 7.2 10*3/uL (ref 4.0–10.5)

## 2020-08-24 NOTE — Progress Notes (Signed)
08/24/2020 Riley Clarke [id number] [birth year]   CHIEF COMPLAINT: Abdominal distension, constipation  HISTORY OF PRESENT ILLNESS: Riley Clarke is a 65 year old male with a past medical history of arthritis, hypertension, DM II and chronic constipation.  History of cervical disc surgery, lumbar fusion and left rotator cuff surgery. He presents to our office today as referred by Riley David NP for further evaluation regarding abdominal distention.  He reports having abdominal distention without any significant abdominal pain least the past year.  He describes seeing a bulge to his upper abdomen and he was concerned he had a stomach ulcer.  He denies having any nausea or vomiting.  No dysphagia or heartburn. Past history of GERD which  is well controlled on Esomeprazole 40 mg once daily.  No prior history of stomach ulcers.  He takes Meloxicam 15 mg daily for arthritis pain and back pain.  No other NSAID use.  He denies ever having an EGD. He passes a large amount of stool once weekly which is his normal bowel pattern.  He denies taking any laxatives.  He describes passing a large amount of solid brown stool followed by loose stool once weekly.  No rectal bleeding or melena. He reported undergoing a colonoscopy by [facility] approximately 5 years ago which she reported was normal.  I will request a copy of his colonoscopy records for further review.  His appetite is good.  His weight is stable.  He denies having any fever, sweats or chills.  No known family history of esophageal, gastric or colon cancer.  He donates blood to the [facility] once every 3 to 4 months.  He takes Ferrous Sulfate 325 mg once daily.  No history of anemia.  CBC Latest Ref Rng & Units 07/25/2017 01/15/2017 08/14/2016  WBC 3.4 - 10.8 x10E3/uL 7.5 6.8 6.4  Hemoglobin 13.0 - 17.7 g/dL 14.7 15.8 15.5  Hematocrit 37.5 - 51.0 % 43.8 47.0 45.7  Platelets 150 - 379 x10E3/uL 153 192 184    Past Medical History:  Diagnosis Date   . Arthritis   . Chronic back pain    on chronic opioids  . Diabetes mellitus without complication ([place])   . GERD (gastroesophageal reflux disease)   . Headache(784.0)   . HLD (hyperlipidemia)   . Hypertension    no meds  . Vitamin D deficiency    Past Surgical History:  Procedure Laterality Date  . CERVICAL DISC SURGERY  99  . LUMBAR FUSION    . ROTATOR CUFF REPAIR Bilateral   . SHOULDER ARTHROSCOPY WITH ROTATOR CUFF REPAIR AND SUBACROMIAL DECOMPRESSION Right 12/30/2018   Procedure: SHOULDER ARTHROSCOPY WITH ROTATOR CUFF REPAIR AND SUBACROMIAL DECOMPRESSION, DISTAL CLAVICLE EXICISION,;  Surgeon: Hiram Gash, MD;  Location: [facility];  Service: Orthopedics;  Laterality: Right;  . SHOULDER ARTHROSCOPY WITH SUBACROMIAL DECOMPRESSION, ROTATOR CUFF REPAIR AND BICEP TENDON REPAIR Left 06/19/2019   Procedure: LEFT SHOULDER ARTHROSCOPY, EXTENTSIVE DEBRIDEMENT, DISTAL CLAVICULECTOMY, SUBACROMIAL DECOMPRESSION, PARTIAL ACROMIOPLASTY WITH CORACOACROMIAL RELEASE, ROTATOR CUFFF REPAIR AND BICEP TENODESIS;  Surgeon: Hiram Gash, MD;  Location: [facility];  Service: Orthopedics;  Laterality: Left;   Social History: He smoked cigarettes x 30 years, quit 2021. No alcohol use. No drug use.   Family History: Mother deceased age 76 heart disease and diabetes. Father history unknown. 5 siblings are relatively healthy.  Allergies  Allergen Reactions  . Shrimp [Shellfish Allergy]     intolerant  . Valium [Diazepam] Itching  Outpatient Encounter Medications as of 08/24/2020  Medication Sig  . Dulaglutide (TRULICITY) 1.5 UE/4.5WU SOPN INJECT 0.5 MILLILITERS INTO THE SKIN ONCE A WEEK  . esomeprazole (NEXIUM) 40 MG capsule TAKE ONE (1) CAPSULE BY MOUTH EVERY MORNING BEFORE BREAKFAST  . ferrous sulfate 325 (65 FE) MG tablet Take 325 mg by mouth daily with breakfast.  . gabapentin (NEURONTIN) 300 MG capsule TAKE 1 CAPSULE BY MOUTH THREE TIMES A DAY  . glucose blood  (ACCU-CHEK AVIVA) test strip Use as instructed  . JARDIANCE 25 MG TABS tablet TAKE ONE (1) TABLET BY MOUTH EVERY DAY  . linagliptin (TRADJENTA) 5 MG TABS tablet Take 1 tablet (5 mg total) by mouth daily.  Marland Kitchen lisinopril-hydrochlorothiazide (ZESTORETIC) 10-12.5 MG tablet Take 1 tablet by mouth daily.  . meloxicam (MOBIC) 15 MG tablet Take 15 mg by mouth daily.  . metFORMIN (GLUCOPHAGE) 1000 MG tablet TAKE 1 TABLET BY MOUTH 2 TIMES DAILY WITH A MEAL.  . simvastatin (ZOCOR) 10 MG tablet Take 1 tablet (10 mg total) by mouth at bedtime.  . Vitamin D, Ergocalciferol, (DRISDOL) 50000 UNITS CAPS capsule Take 50,000 Units by mouth every 7 (seven) days. Reported on 07/05/2015  . [DISCONTINUED] acetaminophen (TYLENOL) 325 MG tablet Take 2 tablets (650 mg total) by mouth every 8 (eight) hours.  . [DISCONTINUED] NARCAN 4 MG/0.1ML LIQD nasal spray kit  (Patient not taking: Reported on 07/23/2020)   No facility-administered encounter medications on file as of 08/24/2020.    REVIEW OF SYSTEMS: Gen: Denies fever, sweats or chills. No weight loss.  CV: Denies chest pain, palpitations or edema. Resp: Denies cough, shortness of breath of hemoptysis.  GI: See HPI. GU : Denies urinary burning, blood in urine, increased urinary frequency or incontinence. MS: Denies joint pain, muscles aches or weakness. Derm: Denies rash, itchiness, skin lesions or unhealing ulcers. Psych: Denies depression, anxiety or memory loss. Heme: Denies bruising, bleeding. Neuro:  Denies headaches, dizziness or paresthesias. Endo:  + DM.   PHYSICAL EXAM: BP 120/70 (BP Location: Left Arm, Patient Position: Sitting, Cuff Size: Normal)   Pulse 76   Ht 5' 11.5" (1.816 m) Comment: height measured without shoes  Wt 233 lb 8 oz (105.9 kg)   BMI 32.11 kg/m  General: 65 year old male in no acute distress. Head: Normocephalic and atraumatic. Eyes:  Sclerae non-icteric, conjunctive pink. Ears: Normal auditory acuity. Mouth: Dentition intact.  No ulcers or lesions.  Neck: Supple, no lymphadenopathy or thyromegaly.  Lungs: Clear bilaterally to auscultation without wheezes, crackles or rhonchi. Heart: Regular rate and rhythm. No murmur, rub or gallop appreciated.  Abdomen: Soft.  Generalized abdominal distention without palpable mass.  Nontender.  Moderate diastasis recti. no masses. No hepatosplenomegaly. Normoactive bowel sounds x 4 quadrants.  Rectal: Deferred. Musculoskeletal: Symmetrical with no gross deformities. Skin: Warm and dry. No rash or lesions on visible extremities. Extremities: No edema. Neurological: Alert oriented x 4, no focal deficits.  Psychological:  Alert and cooperative. Normal mood and affect.  ASSESSMENT AND PLAN:  23.  65 year old male with abdominal distention in setting of chronic constipation.  Patient concern regarding upper abdominal bulge which is consistent with diastasis recti. -MiraLAX nightly to increase stool output -CBC and CMP -CTAP with oral and IV contrast to further evaluate the small bowel and colon -Request copy of colonoscopy report from [facility]  2.  History of GERD, stable -Continue Esomeprazole 40 mg daily  3.  Patient takes Ferrous Sulfate without history of anemia.  He donates blood to the [facility]  approximately every 3 months.  4. DM II  Patient to follow-up in our office in 4 weeks to further review his CTAP results, constipation status and to verify if an EGD and colonoscopy are warranted.     CC:  Vevelyn Francois, NP

## 2020-08-24 NOTE — Patient Instructions (Addendum)
If you are age 63 or younger, your body mass index should be between 19-25. Your Body mass index is 32.11 kg/m. If this is out of the aformentioned range listed, please consider follow up with your Primary Care Provider.   LABS:  Lab work has been ordered for you today. Our lab is located in the basement. Press "B" on the elevator. The lab is located at the first door on the left as you exit the elevator.  HEALTHCARE LAWS AND MY CHART RESULTS: Due to recent changes in healthcare laws, you may see the results of your imaging and laboratory studies on MyChart before your provider has had a chance to review them.   We understand that in some cases there may be results that are confusing or concerning to you. Not all laboratory results come back in the same time frame and the provider may be waiting for multiple results in order to interpret others.  Please give [place] 48 hours in order for your provider to thoroughly review all the results before contacting the office for clarification of your results.   IMAGING:  . You will be contacted by [facility] Scheduling (Your caller ID will indicate phone # [phone]) in the next 2 days to schedule your CT Scan. If you have not heard from them within 2 business days, please call [facility] Scheduling at [phone] to follow up on the status of your appointment.    OVER THE COUNTER MEDICATION Please purchase the following medications over the counter and take as directed:  Miralax- Dissolve one capful in 8 ounces of water and drink before bed.  We have scheduled you a follow up with Dr. Meridee Score on 10/05/20 at 11:30am.  It was great seeing you today! Thank you for entrusting me with your care and choosing [facility].  Arnaldo Natal, CRNP

## 2020-08-24 NOTE — Progress Notes (Signed)
Attending Physician's Attestation   I have reviewed the chart.   I agree with the Advanced Practitioner's note, impression, and recommendations with any updates as below. Reasonable to evaluate and ensure nothing more than diastases recti with CT scan.  Can consider EGD and potential colonoscopy after follow-up/review of prior colonoscopy report.   Corliss Parish, MD [facility] Advanced [facility]oscopy Office # [phone]

## 2020-08-30 ENCOUNTER — Encounter (HOSPITAL_COMMUNITY): Payer: Self-pay

## 2020-08-30 ENCOUNTER — Other Ambulatory Visit: Payer: Self-pay

## 2020-08-30 ENCOUNTER — Ambulatory Visit (HOSPITAL_COMMUNITY)
Admission: RE | Admit: 2020-08-30 | Discharge: 2020-08-30 | Disposition: A | Discharge status: Home / Self Care | Payer: Medicare Other | Source: Ambulatory Visit | Attending: Nurse Practitioner | Admitting: Nurse Practitioner

## 2020-08-30 DIAGNOSIS — K5900 Constipation, unspecified: Secondary | ICD-10-CM | POA: Diagnosis not present

## 2020-08-30 DIAGNOSIS — R140 Abdominal distension (gaseous): Secondary | ICD-10-CM | POA: Insufficient documentation

## 2020-08-30 DIAGNOSIS — R1013 Epigastric pain: Secondary | ICD-10-CM | POA: Diagnosis not present

## 2020-08-30 DIAGNOSIS — M6208 Separation of muscle (nontraumatic), other site: Secondary | ICD-10-CM | POA: Diagnosis not present

## 2020-08-30 MED ORDER — IOHEXOL 300 MG/ML  SOLN
100.0000 mL | Freq: Once | INTRAMUSCULAR | Status: AC | PRN
  Administered 2020-08-30: 100 mL via INTRAVENOUS

## 2020-10-05 ENCOUNTER — Encounter: Payer: Self-pay | Admitting: Gastroenterology

## 2020-10-05 ENCOUNTER — Ambulatory Visit (INDEPENDENT_AMBULATORY_CARE_PROVIDER_SITE_OTHER): Payer: Medicare Other | Admitting: Gastroenterology

## 2020-10-05 VITALS — BP 122/68 | HR 68 | Ht 71.5 in | Wt 237.2 lb

## 2020-10-05 DIAGNOSIS — M6208 Separation of muscle (nontraumatic), other site: Secondary | ICD-10-CM | POA: Diagnosis not present

## 2020-10-05 DIAGNOSIS — R351 Nocturia: Secondary | ICD-10-CM | POA: Diagnosis not present

## 2020-10-05 DIAGNOSIS — R140 Abdominal distension (gaseous): Secondary | ICD-10-CM | POA: Diagnosis not present

## 2020-10-05 NOTE — Patient Instructions (Signed)
If you are age 63 or younger, your body mass index should be between 19-25. Your Body mass index is 32.63 kg/m. If this is out of the aformentioned range listed, please consider follow up with your Primary Care Provider.   We will attempt to obtain records from [facility] and [facility].  You will follow up with our office on an as needed basis.  Thank you for entrusting me with your care and choosing [facility].  Dr Meridee Score

## 2020-10-05 NOTE — Progress Notes (Addendum)
GASTROENTEROLOGY OUTPATIENT CLINIC VISIT   Primary Care Provider Barbette Merino, NP [address] [place] [address] [phone]  Patient Profile: Riley Clarke is a 65 y.o. male with a pmh significant for diabetes, hypertension, hyperlipidemia, chronic back pain (spinal stenosis and cervical radiculitis and degenerative disc disease), diverticulosis.  The patient presents to the [facility] for an evaluation and management of problem(s) noted below:  Problem List 1. Bloating symptom   2. Diastasis recti   3. Nocturia     History of Present Illness Please see initial consultation note by NP [facility] for full details of HPI.  Interval History The patient states that since his CT scan and clinic visit, the previous symptoms of abdominal distention and bloating have improved significantly.  He is watching his diet more closely.  CT scan with results below are unremarkable.  Patient reports having had a Cologuard within the last 2 years that he reports was normal.  He does not believe he had prior colon polyps on his prior colonoscopy years ago.  Biggest symptom for the patient currently is decreased urinary stream and nocturia for which he will be talking with his PCP about in the coming weeks.  GI Review of Systems Positive as above Negative for pyrosis, dysphagia, odynophagia, change in bowel habits, melena, hematochezia  Review of Systems General: Denies fevers/chills/weight loss unintentionally Cardiovascular: Denies chest pain Pulmonary: Denies shortness of breath Gastroenterological: See HPI Genitourinary: Denies darkened urine Hematological: Denies easy bruising/bleeding Endocrine: Denies temperature intolerance Dermatological: Denies jaundice Psychological: Mood is stable   Medications Current Outpatient Medications  Medication Sig Dispense Refill  . Dulaglutide (TRULICITY) 1.5 MG/0.5ML SOPN INJECT 0.5 MILLILITERS INTO THE SKIN  ONCE A WEEK 2 mL 3  . esomeprazole (NEXIUM) 40 MG capsule TAKE ONE (1) CAPSULE BY MOUTH EVERY MORNING BEFORE BREAKFAST 90 capsule 1  . ferrous sulfate 325 (65 FE) MG tablet Take 325 mg by mouth daily with breakfast.    . gabapentin (NEURONTIN) 300 MG capsule TAKE 1 CAPSULE BY MOUTH THREE TIMES A DAY 90 capsule 3  . glucose blood (ACCU-CHEK AVIVA) test strip Use as instructed 100 each 12  . JARDIANCE 25 MG TABS tablet TAKE ONE (1) TABLET BY MOUTH EVERY DAY 90 tablet 3  . linagliptin (TRADJENTA) 5 MG TABS tablet Take 1 tablet (5 mg total) by mouth daily. 30 tablet 3  . lisinopril-hydrochlorothiazide (ZESTORETIC) 10-12.5 MG tablet Take 1 tablet by mouth daily. 90 tablet 3  . meloxicam (MOBIC) 15 MG tablet Take 15 mg by mouth daily.    . metFORMIN (GLUCOPHAGE) 1000 MG tablet TAKE 1 TABLET BY MOUTH 2 TIMES DAILY WITH A MEAL. 180 tablet 1  . simvastatin (ZOCOR) 10 MG tablet Take 1 tablet (10 mg total) by mouth at bedtime. 90 tablet 3  . Vitamin D, Ergocalciferol, (DRISDOL) 50000 UNITS CAPS capsule Take 50,000 Units by mouth every 7 (seven) days. Reported on 07/05/2015     No current facility-administered medications for this visit.    Allergies Allergies  Allergen Reactions  . Shrimp [Shellfish Allergy]     intolerant  . Valium [Diazepam] Itching    Histories Past Medical History:  Diagnosis Date  . Arthritis   . Chronic back pain    on chronic opioids  . Diabetes mellitus without complication (HCC)   . GERD (gastroesophageal reflux disease)   . Headache(784.0)   . HLD (hyperlipidemia)   . Hypertension    no meds  . Vitamin D deficiency  Past Surgical History:  Procedure Laterality Date  . CERVICAL DISC SURGERY  99  . LUMBAR FUSION    . ROTATOR CUFF REPAIR Bilateral   . SHOULDER ARTHROSCOPY WITH ROTATOR CUFF REPAIR AND SUBACROMIAL DECOMPRESSION Right 12/30/2018   Procedure: SHOULDER ARTHROSCOPY WITH ROTATOR CUFF REPAIR AND SUBACROMIAL DECOMPRESSION, DISTAL CLAVICLE EXICISION,;   Surgeon: Bjorn Pippin, MD;  Location: [facility];  Service: Orthopedics;  Laterality: Right;  . SHOULDER ARTHROSCOPY WITH SUBACROMIAL DECOMPRESSION, ROTATOR CUFF REPAIR AND BICEP TENDON REPAIR Left 06/19/2019   Procedure: LEFT SHOULDER ARTHROSCOPY, EXTENTSIVE DEBRIDEMENT, DISTAL CLAVICULECTOMY, SUBACROMIAL DECOMPRESSION, PARTIAL ACROMIOPLASTY WITH CORACOACROMIAL RELEASE, ROTATOR CUFFF REPAIR AND BICEP TENODESIS;  Surgeon: Bjorn Pippin, MD;  Location: [facility];  Service: Orthopedics;  Laterality: Left;   Social History   Socioeconomic History  . Marital status: Single    Spouse name: Not on file  . Number of children: 3  . Years of education: Not on file  . Highest education level: Not on file  Occupational History  . Occupation: disability  Tobacco Use  . Smoking status: Former Smoker    Packs/day: 0.25    Years: 25.00    Pack years: 6.25    Types: Cigarettes    Quit date: 04/26/2020    Years since quitting: 0.4  . Smokeless tobacco: Never Used  . Tobacco comment: 1 cig per day  Vaping Use  . Vaping Use: Never used  Substance and Sexual Activity  . Alcohol use: Not Currently    Alcohol/week: 4.0 standard drinks    Types: 4 Cans of beer per week    Comment: twice a week  . Drug use: No  . Sexual activity: Not on file  Other Topics Concern  . Not on file  Social History Narrative   Right handed   Lives alone in a one story home   Social Determinants of Health   Financial Resource Strain: Not on file  Food Insecurity: Not on file  Transportation Needs: Not on file  Physical Activity: Not on file  Stress: Not on file  Social Connections: Not on file  Intimate Partner Violence: Not on file   Family History  Problem Relation Age of Onset  . Heart disease Mother   . Diabetes Mother   . Amblyopia Neg Hx   . Blindness Neg Hx   . Cataracts Neg Hx   . Hypertension Neg Hx   . Macular degeneration Neg Hx   . Retinal detachment Neg Hx   .  Strabismus Neg Hx   . Esophageal cancer Neg Hx   . Colon cancer Neg Hx   . Pancreatic cancer Neg Hx   . Ulcerative colitis Neg Hx   . Inflammatory bowel disease Neg Hx   . Liver disease Neg Hx   . Rectal cancer Neg Hx   . Stomach cancer Neg Hx    I have reviewed his medical, social, and family history in detail and updated the electronic medical record as necessary.    PHYSICAL EXAMINATION  BP 122/68 (BP Location: Right Arm, Patient Position: Sitting, Cuff Size: Large)   Pulse 68   Ht 5' 11.5" (1.816 m)   Wt 237 lb 4 oz (107.6 kg)   BMI 32.63 kg/m  Wt Readings from Last 3 Encounters:  10/05/20 237 lb 4 oz (107.6 kg)  08/24/20 233 lb 8 oz (105.9 kg)  07/23/20 235 lb (106.6 kg)  GEN: NAD, appears stated age, doesn't appear chronically ill PSYCH: Cooperative, without pressured  speech EYE: Conjunctivae pink, sclerae anicteric ENT: Masked CV: Nontachycardic RESP: No audible wheezing GI: NABS, soft, protuberant abdomen, ventral diastases present, nontender, without rebound or guarding MSK/EXT: Lower extremity edema present bilaterally SKIN: No jaundice NEURO:  Alert & Oriented x 3, no focal deficits   REVIEW OF DATA  I reviewed the following data at the time of this encounter:  GI Procedures and Studies  Will attempt to obtain Cologuard testing as well as previous colonoscopy  Laboratory Studies  Reviewed those in epic  Imaging Studies  March 2022 CT abdomen pelvis with contrast IMPRESSION: No CT findings to account for the patient's abdominal pain. Mild sigmoid diverticulosis, without evidence of diverticulitis. Prostatomegaly, suggesting BPH.   ASSESSMENT  Mr. Gallentine is a 65 y.o. male with a pmh significant for diabetes, hypertension, hyperlipidemia, chronic back pain (spinal stenosis and cervical radiculitis and degenerative disc disease), diverticulosis.  The patient is seen today for evaluation and management of:  1. Bloating symptom   2. Diastasis recti   3.  Nocturia    The patient is clinically and hemodynamically stable.  His major symptoms have improved by monitoring his diet a little bit more closely.  CT scan was unremarkable other than diverticulosis and BPH.  Patient's main symptoms currently are most likely related to BPH and he will be seeing his PCP about this in the coming weeks.  Patient reports previous Cologuard last year and we will try to obtain those records.  If that is the case he will need a repeat Cologuard in 2024 or a colonoscopy at that time.  We will try to obtain the previous [facility] colonoscopy report to ensure that he did not have evidence of polyps where in which Cologuard testing would not meet criteria for an average risk individual.  He will let us know how he is doing in follow-up on an as-needed basis.  All patient questions were answered to the best of my ability, and the patient agrees to the aforementioned plan of action with follow-up as indicated.   PLAN  Continue to monitor symptoms closely If recurrent symptoms develop then fecal elastase testing and SIBO breath testing will be recommended Try to obtain Cologuard testing - Recall in system for 2024 Cologuard versus colonoscopy Obtain prior [facility] colonoscopy report   No orders of the defined types were placed in this encounter.   New Prescriptions   No medications on file   Modified Medications   No medications on file    Planned Follow Up No follow-ups on file.   Total Time in Face-to-Face and in Coordination of Care for patient including independent/personal interpretation/review of prior testing, medical history, examination, medication adjustment, communicating results with the patient directly, and documentation with the EHR is 25 minutes.   Corliss Parish, MD [facility] Office # [phone]

## 2020-10-07 ENCOUNTER — Encounter: Payer: Self-pay | Admitting: Gastroenterology

## 2020-10-07 DIAGNOSIS — R351 Nocturia: Secondary | ICD-10-CM | POA: Insufficient documentation

## 2020-10-07 DIAGNOSIS — M6208 Separation of muscle (nontraumatic), other site: Secondary | ICD-10-CM | POA: Insufficient documentation

## 2020-10-07 DIAGNOSIS — R140 Abdominal distension (gaseous): Secondary | ICD-10-CM | POA: Insufficient documentation

## 2020-10-20 ENCOUNTER — Ambulatory Visit (INDEPENDENT_AMBULATORY_CARE_PROVIDER_SITE_OTHER): Payer: Medicare Other | Admitting: Nurse Practitioner

## 2020-10-20 ENCOUNTER — Encounter: Payer: Self-pay | Admitting: Nurse Practitioner

## 2020-10-20 ENCOUNTER — Other Ambulatory Visit: Payer: Self-pay

## 2020-10-20 VITALS — BP 147/63 | HR 72 | Temp 97.2°F | Wt 234.0 lb

## 2020-10-20 DIAGNOSIS — R3911 Hesitancy of micturition: Secondary | ICD-10-CM | POA: Diagnosis not present

## 2020-10-20 DIAGNOSIS — E119 Type 2 diabetes mellitus without complications: Secondary | ICD-10-CM | POA: Diagnosis not present

## 2020-10-20 DIAGNOSIS — N401 Enlarged prostate with lower urinary tract symptoms: Secondary | ICD-10-CM | POA: Diagnosis not present

## 2020-10-20 LAB — POCT GLYCOSYLATED HEMOGLOBIN (HGB A1C)
HbA1c POC (<> result, manual entry): 7.2 % (ref 4.0–5.6)
HbA1c, POC (controlled diabetic range): 7.2 % — AB (ref 0.0–7.0)
HbA1c, POC (prediabetic range): 7.2 % — AB (ref 5.7–6.4)
Hemoglobin A1C: 7.2 % — AB (ref 4.0–5.6)

## 2020-10-20 LAB — POCT URINALYSIS DIPSTICK
Bilirubin, UA: NEGATIVE
Blood, UA: NEGATIVE
Glucose, UA: POSITIVE — AB
Ketones, UA: NEGATIVE
Leukocytes, UA: NEGATIVE
Nitrite, UA: NEGATIVE
Protein, UA: NEGATIVE
Spec Grav, UA: 1.02 (ref 1.010–1.025)
Urobilinogen, UA: 0.2 E.U./dL
pH, UA: 5.5 (ref 5.0–8.0)

## 2020-10-20 LAB — GLUCOSE, POCT (MANUAL RESULT ENTRY): POC Glucose: 197 mg/dl — AB (ref 70–99)

## 2020-10-20 MED ORDER — TAMSULOSIN HCL 0.4 MG PO CAPS: 0.4000 mg | ORAL_CAPSULE | Freq: Every day | ORAL | 11 refills | Status: DC

## 2020-10-20 NOTE — Progress Notes (Signed)
[facility] Patient [facility] [address] [place], [place]  [address] Phone:  [phone]   Fax:  [phone]   Established Patient Office Visit  Subjective:  Patient ID: Riley Clarke, male    DOB: 1955/12/02  Age: 65 y.o. MRN: [id number]  CC:  Chief Complaint  Patient presents with  . Urinary Frequency    HPI Riley Clarke presents for follow up. He  has a past medical history of Arthritis, Chronic back pain, Diabetes mellitus without complication (HCC), GERD (gastroesophageal reflux disease), Headache(784.0), HLD (hyperlipidemia), Hypertension, and Vitamin D deficiency.   Benign Prostatic Hypertrophy Patient complains of lower urinary tract symptoms. He reports nocturia four times a night, straining and weak stream. He denies frequency, incomplete emptying, intermittency and urgency. Patient states symptoms are of moderate severity. Onset of symptoms was several months ago and was gradual in onset. His AUA Symptom Score is, 16./35. manifested as obstructive symptoms including weak stream, straining. He has no personal history of prostate cancer. He reports a history of no complicating symptoms. He denies flank pain, gross hematuria, kidney stones and recurrent UTI.  CBG is completed randomly . He states that he does ok overall with his diet. He is compliant with his medication. Denies headache, dizziness, visual changes, shortness of breath, dyspnea on exertion, chest pain, nausea, vomiting or any edema.    Past Medical History:  Diagnosis Date  . Arthritis   . Chronic back pain    on chronic opioids  . Diabetes mellitus without complication (HCC)   . GERD (gastroesophageal reflux disease)   . Headache(784.0)   . HLD (hyperlipidemia)   . Hypertension    no meds  . Vitamin D deficiency     Past Surgical History:  Procedure Laterality Date  . CERVICAL DISC SURGERY  99  . LUMBAR FUSION    . ROTATOR CUFF REPAIR Bilateral   . SHOULDER ARTHROSCOPY WITH ROTATOR  CUFF REPAIR AND SUBACROMIAL DECOMPRESSION Right 12/30/2018   Procedure: SHOULDER ARTHROSCOPY WITH ROTATOR CUFF REPAIR AND SUBACROMIAL DECOMPRESSION, DISTAL CLAVICLE EXICISION,;  Surgeon: Bjorn Pippin, MD;  Location: [facility];  Service: Orthopedics;  Laterality: Right;  . SHOULDER ARTHROSCOPY WITH SUBACROMIAL DECOMPRESSION, ROTATOR CUFF REPAIR AND BICEP TENDON REPAIR Left 06/19/2019   Procedure: LEFT SHOULDER ARTHROSCOPY, EXTENTSIVE DEBRIDEMENT, DISTAL CLAVICULECTOMY, SUBACROMIAL DECOMPRESSION, PARTIAL ACROMIOPLASTY WITH CORACOACROMIAL RELEASE, ROTATOR CUFFF REPAIR AND BICEP TENODESIS;  Surgeon: Bjorn Pippin, MD;  Location: [facility];  Service: Orthopedics;  Laterality: Left;    Family History  Problem Relation Age of Onset  . Heart disease Mother   . Diabetes Mother   . Amblyopia Neg Hx   . Blindness Neg Hx   . Cataracts Neg Hx   . Hypertension Neg Hx   . Macular degeneration Neg Hx   . Retinal detachment Neg Hx   . Strabismus Neg Hx   . Esophageal cancer Neg Hx   . Colon cancer Neg Hx   . Pancreatic cancer Neg Hx   . Ulcerative colitis Neg Hx   . Inflammatory bowel disease Neg Hx   . Liver disease Neg Hx   . Rectal cancer Neg Hx   . Stomach cancer Neg Hx     Social History   Socioeconomic History  . Marital status: Single    Spouse name: Not on file  . Number of children: 3  . Years of education: Not on file  . Highest education level: Not on file  Occupational History  .  Occupation: disability  Tobacco Use  . Smoking status: Former Smoker    Packs/day: 0.25    Years: 25.00    Pack years: 6.25    Types: Cigarettes    Quit date: 04/26/2020    Years since quitting: 0.4  . Smokeless tobacco: Never Used  . Tobacco comment: 1 cig per day  Vaping Use  . Vaping Use: Never used  Substance and Sexual Activity  . Alcohol use: Not Currently    Alcohol/week: 4.0 standard drinks    Types: 4 Cans of beer per week    Comment: twice a week  .  Drug use: No  . Sexual activity: Not on file  Other Topics Concern  . Not on file  Social History Narrative   Right handed   Lives alone in a one story home   Social Determinants of Health   Financial Resource Strain: Not on file  Food Insecurity: Not on file  Transportation Needs: Not on file  Physical Activity: Not on file  Stress: Not on file  Social Connections: Not on file  Intimate Partner Violence: Not on file    Outpatient Medications Prior to Visit  Medication Sig Dispense Refill  . Dulaglutide (TRULICITY) 1.5 MG/0.5ML SOPN INJECT 0.5 MILLILITERS INTO THE SKIN ONCE A WEEK 2 mL 3  . esomeprazole (NEXIUM) 40 MG capsule TAKE ONE (1) CAPSULE BY MOUTH EVERY MORNING BEFORE BREAKFAST 90 capsule 1  . ferrous sulfate 325 (65 FE) MG tablet Take 325 mg by mouth daily with breakfast.    . gabapentin (NEURONTIN) 300 MG capsule TAKE 1 CAPSULE BY MOUTH THREE TIMES A DAY 90 capsule 3  . glucose blood (ACCU-CHEK AVIVA) test strip Use as instructed 100 each 12  . JARDIANCE 25 MG TABS tablet TAKE ONE (1) TABLET BY MOUTH EVERY DAY 90 tablet 3  . linagliptin (TRADJENTA) 5 MG TABS tablet Take 1 tablet (5 mg total) by mouth daily. 30 tablet 3  . lisinopril-hydrochlorothiazide (ZESTORETIC) 10-12.5 MG tablet Take 1 tablet by mouth daily. 90 tablet 3  . meloxicam (MOBIC) 15 MG tablet Take 15 mg by mouth daily.    . metFORMIN (GLUCOPHAGE) 1000 MG tablet TAKE 1 TABLET BY MOUTH 2 TIMES DAILY WITH A MEAL. 180 tablet 1  . simvastatin (ZOCOR) 10 MG tablet Take 1 tablet (10 mg total) by mouth at bedtime. 90 tablet 3  . Vitamin D, Ergocalciferol, (DRISDOL) 50000 UNITS CAPS capsule Take 50,000 Units by mouth every 7 (seven) days. Reported on 07/05/2015     No facility-administered medications prior to visit.    Allergies  Allergen Reactions  . Shrimp [Shellfish Allergy]     intolerant  . Valium [Diazepam] Itching    ROS Review of Systems    Objective:    Physical Exam HENT:     Head:  Normocephalic and atraumatic.     Nose: Nose normal.     Mouth/Throat:     Mouth: Mucous membranes are moist.  Cardiovascular:     Rate and Rhythm: Normal rate and regular rhythm.     Pulses: Normal pulses.     Heart sounds: Normal heart sounds.  Pulmonary:     Effort: Pulmonary effort is normal.     Breath sounds: Normal breath sounds.  Abdominal:     Palpations: Abdomen is soft.     Comments: Increased abdominal girth Weakness to abdominal wall  Musculoskeletal:        General: Normal range of motion.     Cervical back: Normal  range of motion.  Skin:    General: Skin is warm and dry.     Capillary Refill: Capillary refill takes less than 2 seconds.  Neurological:     General: No focal deficit present.     Mental Status: He is alert and oriented to person, place, and time.  Psychiatric:        Mood and Affect: Mood normal.        Behavior: Behavior normal.        Thought Content: Thought content normal.        Judgment: Judgment normal.     BP (!) 147/63   Pulse 72   Temp (!) 97.2 F (36.2 C)   Wt 234 lb 0.4 oz (106.2 kg)   SpO2 96%   BMI 32.18 kg/m  Wt Readings from Last 3 Encounters:  10/20/20 234 lb 0.4 oz (106.2 kg)  10/05/20 237 lb 4 oz (107.6 kg)  08/24/20 233 lb 8 oz (105.9 kg)     Health Maintenance Due  Topic Date Due  . OPHTHALMOLOGY EXAM  03/23/2019    There are no preventive care reminders to display for this patient.  Lab Results  Component Value Date   TSH 2.164 07/05/2015   Lab Results  Component Value Date   WBC 7.2 08/24/2020   HGB 14.5 08/24/2020   HCT 43.0 08/24/2020   MCV 86.3 08/24/2020   PLT 211.0 08/24/2020   Lab Results  Component Value Date   NA 136 08/24/2020   K 4.1 08/24/2020   CO2 28 08/24/2020   GLUCOSE 132 (H) 08/24/2020   BUN 10 08/24/2020   CREATININE 0.84 08/24/2020   BILITOT 0.3 08/24/2020   ALKPHOS 87 08/24/2020   AST 18 08/24/2020   ALT 21 08/24/2020   PROT 7.5 08/24/2020   ALBUMIN 4.4 08/24/2020    CALCIUM 9.5 08/24/2020   ANIONGAP 14 06/17/2019   GFR 93.22 08/24/2020   Lab Results  Component Value Date   CHOL 133 07/26/2020   Lab Results  Component Value Date   HDL 63 07/26/2020   Lab Results  Component Value Date   LDLCALC 51 07/26/2020   Lab Results  Component Value Date   TRIG 105 07/26/2020   Lab Results  Component Value Date   CHOLHDL 2.1 07/26/2020   Lab Results  Component Value Date   HGBA1C 7.2 (A) 10/20/2020   HGBA1C 7.2 10/20/2020   HGBA1C 7.2 (A) 10/20/2020   HGBA1C 7.2 (A) 10/20/2020      Assessment & Plan:   Problem List Items Addressed This Visit      Endocrine   Diabetes (HCC) - Primary Stable A1C at goal 7.2% Encourage compliance with current treatment regimen  Encourage regular CBG monitoring Encourage contacting office if excessive hyperglycemia and or hypoglycemia Lifestyle modification with healthy diet (fewer calories, more high fiber foods, whole grains and non-starchy vegetables, lower fat meat and fish, low-fat diary include healthy oils) regular exercise (physical activity) and weight loss Opthalmology exam discussed  Nutritional consult recommended Regular dental visits encouraged Home BP monitoring also encouraged goal <130/80     Relevant Orders   Urinalysis Dipstick (Completed)   HgB A1c (Completed)   Glucose (CBG) (Completed)    Other Visit Diagnoses    Benign prostatic hyperplasia with urinary hesitancy     Progressing  Trial Tamsuloin 0.4 mg may increase to 0.8.g if needed.  Education provided   Relevant Medications   tamsulosin (FLOMAX) 0.4 MG CAPS capsule  Meds ordered this encounter  Medications  . tamsulosin (FLOMAX) 0.4 MG CAPS capsule    Sig: Take 1 capsule (0.4 mg total) by mouth daily.    Dispense:  30 capsule    Refill:  11    Order Specific Question:   Supervising Provider    Answer:   Quentin AngstJEGEDE, OLUGBEMIGA E [[id number]]    Follow-up: Return in about 4 weeks (around 11/17/2020) for follow up  medication management [phone].    Barbette Merinorystal M Abhishek Levesque, NP

## 2020-10-20 NOTE — Patient Instructions (Addendum)
Diabetes Mellitus and Nutrition, Adult When you have diabetes, or diabetes mellitus, it is very important to have healthy eating habits because your blood sugar (glucose) levels are greatly affected by what you eat and drink. Eating healthy foods in the right amounts, at about the same times every day, can help you:  Control your blood glucose.  Lower your risk of heart disease.  Improve your blood pressure.  Reach or maintain a healthy weight. What can affect my meal plan? Every person with diabetes is different, and each person has different needs for a meal plan. Your health care provider may recommend that you work with a dietitian to make a meal plan that is best for you. Your meal plan may vary depending on factors such as:  The calories you need.  The medicines you take.  Your weight.  Your blood glucose, blood pressure, and cholesterol levels.  Your activity level.  Other health conditions you have, such as heart or kidney disease. How do carbohydrates affect me? Carbohydrates, also called carbs, affect your blood glucose level more than any other type of food. Eating carbs naturally raises the amount of glucose in your blood. Carb counting is a method for keeping track of how many carbs you eat. Counting carbs is important to keep your blood glucose at a healthy level, especially if you use insulin or take certain oral diabetes medicines. It is important to know how many carbs you can safely have in each meal. This is different for every person. Your dietitian can help you calculate how many carbs you should have at each meal and for each snack. How does alcohol affect me? Alcohol can cause a sudden decrease in blood glucose (hypoglycemia), especially if you use insulin or take certain oral diabetes medicines. Hypoglycemia can be a life-threatening condition. Symptoms of hypoglycemia, such as sleepiness, dizziness, and confusion, are similar to symptoms of having too much  alcohol.  Do not drink alcohol if: ? Your health care provider tells you not to drink. ? You are pregnant, may be pregnant, or are planning to become pregnant.  If you drink alcohol: ? Do not drink on an empty stomach. ? Limit how much you use to:  0-1 drink a day for women.  0-2 drinks a day for men. ? Be aware of how much alcohol is in your drink. In the [place] one drink equals one 12 oz bottle of beer (355 mL), one 5 oz glass of wine (148 mL), or one 1 oz glass of hard liquor (44 mL). ? Keep yourself hydrated with water, diet soda, or unsweetened iced tea.  Keep in mind that regular soda, juice, and other mixers may contain a lot of sugar and must be counted as carbs. What are tips for following this plan? Reading food labels  Start by checking the serving size on the "Nutrition Facts" label of packaged foods and drinks. The amount of calories, carbs, fats, and other nutrients listed on the label is based on one serving of the item. Many items contain more than one serving per package.  Check the total grams (g) of carbs in one serving. You can calculate the number of servings of carbs in one serving by dividing the total carbs by 15. For example, if a food has 30 g of total carbs per serving, it would be equal to 2 servings of carbs.  Check the number of grams (g) of saturated fats and trans fats in one serving. Choose foods that have   a low amount or none of these fats.  Check the number of milligrams (mg) of salt (sodium) in one serving. Most people should limit total sodium intake to less than 2,300 mg per day.  Always check the nutrition information of foods labeled as "low-fat" or "nonfat." These foods may be higher in added sugar or refined carbs and should be avoided.  Talk to your dietitian to identify your daily goals for nutrients listed on the label. Shopping  Avoid buying canned, pre-made, or processed foods. These foods tend to be high in fat, sodium, and added  sugar.  Shop around the outside edge of the grocery store. This is where you will most often find fresh fruits and vegetables, bulk grains, fresh meats, and fresh dairy. Cooking  Use low-heat cooking methods, such as baking, instead of high-heat cooking methods like deep frying.  Cook using healthy oils, such as olive, canola, or sunflower oil.  Avoid cooking with butter, cream, or high-fat meats. Meal planning  Eat meals and snacks regularly, preferably at the same times every day. Avoid going long periods of time without eating.  Eat foods that are high in fiber, such as fresh fruits, vegetables, beans, and whole grains. Talk with your dietitian about how many servings of carbs you can eat at each meal.  Eat 4-6 oz (112-168 g) of lean protein each day, such as lean meat, chicken, fish, eggs, or tofu. One ounce (oz) of lean protein is equal to: ? 1 oz (28 g) of meat, chicken, or fish. ? 1 egg. ?  cup (62 g) of tofu.  Eat some foods each day that contain healthy fats, such as avocado, nuts, seeds, and fish.   What foods should I eat? Fruits Berries. Apples. Oranges. Peaches. Apricots. Plums. Grapes. Mango. Papaya. Pomegranate. Kiwi. Cherries. Vegetables Lettuce. Spinach. Leafy greens, including kale, chard, collard greens, and mustard greens. Beets. Cauliflower. Cabbage. Broccoli. Carrots. Green beans. Tomatoes. Peppers. Onions. Cucumbers. Brussels sprouts. Grains Whole grains, such as whole-wheat or whole-grain bread, crackers, tortillas, cereal, and pasta. Unsweetened oatmeal. Quinoa. Brown or wild rice. Meats and other proteins Seafood. Poultry without skin. Lean cuts of poultry and beef. Tofu. Nuts. Seeds. Dairy Low-fat or fat-free dairy products such as milk, yogurt, and cheese. The items listed above may not be a complete list of foods and beverages you can eat. Contact a dietitian for more information. What foods should I avoid? Fruits Fruits canned with  syrup. Vegetables Canned vegetables. Frozen vegetables with butter or cream sauce. Grains Refined white flour and flour products such as bread, pasta, snack foods, and cereals. Avoid all processed foods. Meats and other proteins Fatty cuts of meat. Poultry with skin. Breaded or fried meats. Processed meat. Avoid saturated fats. Dairy Full-fat yogurt, cheese, or milk. Beverages Sweetened drinks, such as soda or iced tea. The items listed above may not be a complete list of foods and beverages you should avoid. Contact a dietitian for more information. Questions to ask a health care provider  Do I need to meet with a diabetes educator?  Do I need to meet with a dietitian?  What number can I call if I have questions?  When are the best times to check my blood glucose? Where to find more information:  American Diabetes Association: diabetes.org  Academy of Nutrition and Dietetics: www.eatright.org  National Institute of Diabetes and Digestive and Kidney Diseases: www.niddk.nih.gov  Association of Diabetes Care and Education Specialists: www.diabeteseducator.org Summary  It is important to have healthy eating   habits because your blood sugar (glucose) levels are greatly affected by what you eat and drink.  A healthy meal plan will help you control your blood glucose and maintain a healthy lifestyle.  Your health care provider may recommend that you work with a dietitian to make a meal plan that is best for you.  Keep in mind that carbohydrates (carbs) and alcohol have immediate effects on your blood glucose levels. It is important to count carbs and to use alcohol carefully. This information is not intended to replace advice given to you by your health care provider. Make sure you discuss any questions you have with your health care provider. Document Revised: 05/06/2019 Document Reviewed: 05/06/2019 Elsevier Patient Education  2021 Elsevier Inc.   Benign Prostatic  Hyperplasia  Benign prostatic hyperplasia (BPH) is an enlarged prostate gland that is caused by the normal aging process and not by cancer. The prostate is a walnut-sized gland that is involved in the production of semen. It is located in front of the rectum and below the bladder. The bladder stores urine and the urethra is the tube that carries the urine out of the body. The prostate may get bigger as a man gets older. An enlarged prostate can press on the urethra. This can make it harder to pass urine. The build-up of urine in the bladder can cause infection. Back pressure and infection may progress to bladder damage and kidney (renal) failure. What are the causes? This condition is part of a normal aging process. However, not all men develop problems from this condition. If the prostate enlarges away from the urethra, urine flow will not be blocked. If it enlarges toward the urethra and compresses it, there will be problems passing urine. What increases the risk? This condition is more likely to develop in men over the age of 50 years. What are the signs or symptoms? Symptoms of this condition include:  Getting up often during the night to urinate.  Needing to urinate frequently during the day.  Difficulty starting urine flow.  Decrease in size and strength of your urine stream.  Leaking (dribbling) after urinating.  Inability to pass urine. This needs immediate treatment.  Inability to completely empty your bladder.  Pain when you pass urine. This is more common if there is also an infection.  Urinary tract infection (UTI). How is this diagnosed? This condition is diagnosed based on your medical history, a physical exam, and your symptoms. Tests will also be done, such as:  A post-void bladder scan. This measures any amount of urine that may remain in your bladder after you finish urinating.  A digital rectal exam. In a rectal exam, your health care provider checks your prostate  by putting a lubricated, gloved finger into your rectum to feel the back of your prostate gland. This exam detects the size of your gland and any abnormal lumps or growths.  An exam of your urine (urinalysis).  A prostate specific antigen (PSA) screening. This is a blood test used to screen for prostate cancer.  An ultrasound. This test uses sound waves to electronically produce a picture of your prostate gland. Your health care provider may refer you to a specialist in kidney and prostate diseases (urologist). How is this treated? Once symptoms begin, your health care provider will monitor your condition (active surveillance or watchful waiting). Treatment for this condition will depend on the severity of your condition. Treatment may include:  Observation and yearly exams. This may be the only  treatment needed if your condition and symptoms are mild.  Medicines to relieve your symptoms, including: ? Medicines to shrink the prostate. ? Medicines to relax the muscle of the prostate.  Surgery in severe cases. Surgery may include: ? Prostatectomy. In this procedure, the prostate tissue is removed completely through an open incision or with a laparoscope or robotics. ? Transurethral resection of the prostate (TURP). In this procedure, a tool is inserted through the opening at the tip of the penis (urethra). It is used to cut away tissue of the inner core of the prostate. The pieces are removed through the same opening of the penis. This removes the blockage. ? Transurethral incision (TUIP). In this procedure, small cuts are made in the prostate. This lessens the prostate's pressure on the urethra. ? Transurethral microwave thermotherapy (TUMT). This procedure uses microwaves to create heat. The heat destroys and removes a small amount of prostate tissue. ? Transurethral needle ablation (TUNA). This procedure uses radio frequencies to destroy and remove a small amount of prostate  tissue. ? Interstitial laser coagulation (ILC). This procedure uses a laser to destroy and remove a small amount of prostate tissue. ? Transurethral electrovaporization (TUVP). This procedure uses electrodes to destroy and remove a small amount of prostate tissue. ? Prostatic urethral lift. This procedure inserts an implant to push the lobes of the prostate away from the urethra. Follow these instructions at home:  Take over-the-counter and prescription medicines only as told by your health care provider.  Monitor your symptoms for any changes. Contact your health care provider with any changes.  Avoid drinking large amounts of liquid before going to bed or out in public.  Avoid or reduce how much caffeine or alcohol you drink.  Give yourself time when you urinate.  Keep all follow-up visits as told by your health care provider. This is important. Contact a health care provider if:  You have unexplained back pain.  Your symptoms do not get better with treatment.  You develop side effects from the medicine you are taking.  Your urine becomes very dark or has a bad smell.  Your lower abdomen becomes distended and you have trouble passing your urine. Get help right away if:  You have a fever or chills.  You suddenly cannot urinate.  You feel lightheaded, or very dizzy, or you faint.  There are large amounts of blood or clots in the urine.  Your urinary problems become hard to manage.  You develop moderate to severe low back or flank pain. The flank is the side of your body between the ribs and the hip. These symptoms may represent a serious problem that is an emergency. Do not wait to see if the symptoms will go away. Get medical help right away. Call your local emergency services (911 in the [place] Do not drive yourself to the hospital. Summary  Benign prostatic hyperplasia (BPH) is an enlarged prostate that is caused by the normal aging process and not by cancer.  An  enlarged prostate can press on the urethra. This can make it hard to pass urine.  This condition is part of a normal aging process and is more likely to develop in men over the age of 50 years.  Get help right away if you suddenly cannot urinate. This information is not intended to replace advice given to you by your health care provider. Make sure you discuss any questions you have with your health care provider. Document Revised: 02/05/2020 Document Reviewed: 02/05/2020 Elsevier  Elsevier Patient Education  2021 Elsevier Inc.   

## 2020-11-10 ENCOUNTER — Other Ambulatory Visit: Payer: Self-pay

## 2020-11-10 ENCOUNTER — Ambulatory Visit (INDEPENDENT_AMBULATORY_CARE_PROVIDER_SITE_OTHER): Payer: Medicare Other | Admitting: Podiatry

## 2020-11-10 ENCOUNTER — Encounter: Payer: Self-pay | Admitting: Podiatry

## 2020-11-10 DIAGNOSIS — M722 Plantar fascial fibromatosis: Secondary | ICD-10-CM | POA: Diagnosis not present

## 2020-11-10 MED ORDER — TRIAMCINOLONE ACETONIDE 10 MG/ML IJ SUSP
20.0000 mg | Freq: Once | INTRAMUSCULAR | Status: AC
Start: 2020-11-10 — End: 2020-11-10
  Administered 2020-11-10: 20 mg

## 2020-11-11 NOTE — Progress Notes (Signed)
Subjective:   Patient ID: Riley Clarke, male   DOB: 65 y.o.   MRN: [id number]   HPI Patient presents stating his heels have started to hurt again on both feet and he made it 3 months   ROS      Objective:  Physical Exam  Neurovascular status intact inflammation pain of the plantar heel region bilateral fluid buildup around the medial band     Assessment:  Chronic Planter fasciitis bilateral heel     Plan:  Sterile prep injected the plantar fascial bilateral 3 mg Kenalog 4 mg Xylocaine advised on shoe gear modification stretching anti-inflammatories reappoint as needed

## 2020-11-19 ENCOUNTER — Ambulatory Visit (INDEPENDENT_AMBULATORY_CARE_PROVIDER_SITE_OTHER): Payer: Medicare Other | Admitting: Nurse Practitioner

## 2020-11-19 ENCOUNTER — Other Ambulatory Visit: Payer: Self-pay

## 2020-11-19 VITALS — BP 144/75 | HR 69 | Temp 97.3°F | Wt 225.0 lb

## 2020-11-19 DIAGNOSIS — R3911 Hesitancy of micturition: Secondary | ICD-10-CM | POA: Diagnosis not present

## 2020-11-19 DIAGNOSIS — N401 Enlarged prostate with lower urinary tract symptoms: Secondary | ICD-10-CM

## 2020-11-19 DIAGNOSIS — I152 Hypertension secondary to endocrine disorders: Secondary | ICD-10-CM

## 2020-11-19 MED ORDER — LISINOPRIL-HYDROCHLOROTHIAZIDE 20-25 MG PO TABS: 1.0000 | ORAL_TABLET | Freq: Every day | ORAL | 3 refills | Status: DC

## 2020-11-19 MED ORDER — FINASTERIDE 5 MG PO TABS: 5.0000 mg | ORAL_TABLET | Freq: Every day | ORAL | 3 refills | Status: DC

## 2020-11-19 NOTE — Patient Instructions (Signed)
Benign Prostatic Hyperplasia  Benign prostatic hyperplasia (BPH) is an enlarged prostate gland that is caused by the normal aging process and not by cancer. The prostate is a walnut-sized gland that is involved in the production of semen. It is located in front of the rectum and below the bladder. The bladder stores urine and the urethra is the tube that carries the urine out of the body. The prostate may get bigger asa man gets older. An enlarged prostate can press on the urethra. This can make it harder to pass urine. The build-up of urine in the bladder can cause infection. Back pressure and infection may progress to bladder damage and kidney (renal) failure. What are the causes? This condition is part of a normal aging process. However, not all men develop problems from this condition. If the prostate enlarges away from the urethra, urine flow will not be blocked. If it enlarges toward the urethra andcompresses it, there will be problems passing urine. What increases the risk? This condition is more likely to develop in men over the age of 50 years. What are the signs or symptoms? Symptoms of this condition include: Getting up often during the night to urinate. Needing to urinate frequently during the day. Difficulty starting urine flow. Decrease in size and strength of your urine stream. Leaking (dribbling) after urinating. Inability to pass urine. This needs immediate treatment. Inability to completely empty your bladder. Pain when you pass urine. This is more common if there is also an infection. Urinary tract infection (UTI). How is this diagnosed? This condition is diagnosed based on your medical history, a physical exam, and your symptoms. Tests will also be done, such as: A post-void bladder scan. This measures any amount of urine that may remain in your bladder after you finish urinating. A digital rectal exam. In a rectal exam, your health care provider checks your prostate by  putting a lubricated, gloved finger into your rectum to feel the back of your prostate gland. This exam detects the size of your gland and any abnormal lumps or growths. An exam of your urine (urinalysis). A prostate specific antigen (PSA) screening. This is a blood test used to screen for prostate cancer. An ultrasound. This test uses sound waves to electronically produce a picture of your prostate gland. Your health care provider may refer you to a specialist in kidney and prostate diseases (urologist). How is this treated? Once symptoms begin, your health care provider will monitor your condition (active surveillance or watchful waiting). Treatment for this condition will depend on the severity of your condition. Treatment may include: Observation and yearly exams. This may be the only treatment needed if your condition and symptoms are mild. Medicines to relieve your symptoms, including: Medicines to shrink the prostate. Medicines to relax the muscle of the prostate. Surgery in severe cases. Surgery may include: Prostatectomy. In this procedure, the prostate tissue is removed completely through an open incision or with a laparoscope or robotics. Transurethral resection of the prostate (TURP). In this procedure, a tool is inserted through the opening at the tip of the penis (urethra). It is used to cut away tissue of the inner core of the prostate. The pieces are removed through the same opening of the penis. This removes the blockage. Transurethral incision (TUIP). In this procedure, small cuts are made in the prostate. This lessens the prostate's pressure on the urethra. Transurethral microwave thermotherapy (TUMT). This procedure uses microwaves to create heat. The heat destroys and removes a small   amount of prostate tissue. Transurethral needle ablation (TUNA). This procedure uses radio frequencies to destroy and remove a small amount of prostate tissue. Interstitial laser coagulation (ILC).  This procedure uses a laser to destroy and remove a small amount of prostate tissue. Transurethral electrovaporization (TUVP). This procedure uses electrodes to destroy and remove a small amount of prostate tissue. Prostatic urethral lift. This procedure inserts an implant to push the lobes of the prostate away from the urethra. Follow these instructions at home: Take over-the-counter and prescription medicines only as told by your health care provider. Monitor your symptoms for any changes. Contact your health care provider with any changes. Avoid drinking large amounts of liquid before going to bed or out in public. Avoid or reduce how much caffeine or alcohol you drink. Give yourself time when you urinate. Keep all follow-up visits as told by your health care provider. This is important. Contact a health care provider if: You have unexplained back pain. Your symptoms do not get better with treatment. You develop side effects from the medicine you are taking. Your urine becomes very dark or has a bad smell. Your lower abdomen becomes distended and you have trouble passing your urine. Get help right away if: You have a fever or chills. You suddenly cannot urinate. You feel lightheaded, or very dizzy, or you faint. There are large amounts of blood or clots in the urine. Your urinary problems become hard to manage. You develop moderate to severe low back or flank pain. The flank is the side of your body between the ribs and the hip. These symptoms may represent a serious problem that is an emergency. Do not wait to see if the symptoms will go away. Get medical help right away. Call your local emergency services (911 in the [place] Do not drive yourself to the hospital. Summary Benign prostatic hyperplasia (BPH) is an enlarged prostate that is caused by the normal aging process and not by cancer. An enlarged prostate can press on the urethra. This can make it hard to pass urine. This  condition is part of a normal aging process and is more likely to develop in men over the age of 50 years. Get help right away if you suddenly cannot urinate. This information is not intended to replace advice given to you by your health care provider. Make sure you discuss any questions you have with your healthcare provider. Document Revised: 02/05/2020 Document Reviewed: 02/05/2020 Elsevier Patient Education  2022 [employer] Inc.  

## 2020-11-19 NOTE — Progress Notes (Signed)
[facility] Patient [facility] [address] [place], [place]  [address] Phone:  [phone]   Fax:  [phone]   Established Patient Office Visit  Subjective:  Patient ID: Riley Clarke, male    DOB: 1955/08/07  Age: 65 y.o. MRN: [id number]  CC:  Chief Complaint  Patient presents with   Follow-up    4 week follow up;     HPI Riley Clarke presents for follow up. He  has a past medical history of Arthritis, Chronic back pain, Diabetes mellitus without complication (HCC), GERD (gastroesophageal reflux disease), Headache(784.0), HLD (hyperlipidemia), Hypertension, and Vitamin D deficiency.   He is following up for his BPH.  He was started on tamsulosin 0.4 mg daily with instructions to increase to tamsulosin 0.8 mg if needed.  He feels like this was not effective he continues to experience nocturia with straining and weak stream.  He also continues to have slight increase and blood pressure.  He is compliant with his medications he is currently on lisinopril HCT 10/12.5 mg and blood pressure ranges in the 140s.  He also has stable diabetes with A1c of 7.2..  Past Medical History:  Diagnosis Date   Arthritis    Chronic back pain    on chronic opioids   Diabetes mellitus without complication (HCC)    GERD (gastroesophageal reflux disease)    Headache(784.0)    HLD (hyperlipidemia)    Hypertension    no meds   Vitamin D deficiency     Past Surgical History:  Procedure Laterality Date   CERVICAL DISC SURGERY  99   LUMBAR FUSION     ROTATOR CUFF REPAIR Bilateral    SHOULDER ARTHROSCOPY WITH ROTATOR CUFF REPAIR AND SUBACROMIAL DECOMPRESSION Right 12/30/2018   Procedure: SHOULDER ARTHROSCOPY WITH ROTATOR CUFF REPAIR AND SUBACROMIAL DECOMPRESSION, DISTAL CLAVICLE EXICISION,;  Surgeon: Bjorn Pippin, MD;  Location: [facility];  Service: Orthopedics;  Laterality: Right;   SHOULDER ARTHROSCOPY WITH SUBACROMIAL DECOMPRESSION, ROTATOR CUFF REPAIR AND BICEP  TENDON REPAIR Left 06/19/2019   Procedure: LEFT SHOULDER ARTHROSCOPY, EXTENTSIVE DEBRIDEMENT, DISTAL CLAVICULECTOMY, SUBACROMIAL DECOMPRESSION, PARTIAL ACROMIOPLASTY WITH CORACOACROMIAL RELEASE, ROTATOR CUFFF REPAIR AND BICEP TENODESIS;  Surgeon: Bjorn Pippin, MD;  Location: [facility];  Service: Orthopedics;  Laterality: Left;    Family History  Problem Relation Age of Onset   Heart disease Mother    Diabetes Mother    Amblyopia Neg Hx    Blindness Neg Hx    Cataracts Neg Hx    Hypertension Neg Hx    Macular degeneration Neg Hx    Retinal detachment Neg Hx    Strabismus Neg Hx    Esophageal cancer Neg Hx    Colon cancer Neg Hx    Pancreatic cancer Neg Hx    Ulcerative colitis Neg Hx    Inflammatory bowel disease Neg Hx    Liver disease Neg Hx    Rectal cancer Neg Hx    Stomach cancer Neg Hx     Social History   Socioeconomic History   Marital status: Single    Spouse name: Not on file   Number of children: 3   Years of education: Not on file   Highest education level: Not on file  Occupational History   Occupation: disability  Tobacco Use   Smoking status: Former    Packs/day: 0.25    Years: 25.00    Pack years: 6.25    Types: Cigarettes    Quit date:  04/26/2020    Years since quitting: 0.5   Smokeless tobacco: Never   Tobacco comments:    1 cig per day  Vaping Use   Vaping Use: Never used  Substance and Sexual Activity   Alcohol use: Not Currently    Alcohol/week: 4.0 standard drinks    Types: 4 Cans of beer per week    Comment: twice a week   Drug use: No   Sexual activity: Not on file  Other Topics Concern   Not on file  Social History Narrative   Right handed   Lives alone in a one story home   Social Determinants of Health   Financial Resource Strain: Not on file  Food Insecurity: Not on file  Transportation Needs: Not on file  Physical Activity: Not on file  Stress: Not on file  Social Connections: Not on file  Intimate Partner  Violence: Not on file    Outpatient Medications Prior to Visit  Medication Sig Dispense Refill   Dulaglutide (TRULICITY) 1.5 MG/0.5ML SOPN INJECT 0.5 MILLILITERS INTO THE SKIN ONCE A WEEK 2 mL 3   esomeprazole (NEXIUM) 40 MG capsule TAKE ONE (1) CAPSULE BY MOUTH EVERY MORNING BEFORE BREAKFAST 90 capsule 1   ferrous sulfate 325 (65 FE) MG tablet Take 325 mg by mouth daily with breakfast.     gabapentin (NEURONTIN) 300 MG capsule TAKE 1 CAPSULE BY MOUTH THREE TIMES A DAY 90 capsule 3   glucose blood (ACCU-CHEK AVIVA) test strip Use as instructed 100 each 12   JARDIANCE 25 MG TABS tablet TAKE ONE (1) TABLET BY MOUTH EVERY DAY 90 tablet 3   linagliptin (TRADJENTA) 5 MG TABS tablet Take 1 tablet (5 mg total) by mouth daily. 30 tablet 3   meloxicam (MOBIC) 15 MG tablet Take 15 mg by mouth daily.     metFORMIN (GLUCOPHAGE) 1000 MG tablet TAKE 1 TABLET BY MOUTH 2 TIMES DAILY WITH A MEAL. 180 tablet 1   simvastatin (ZOCOR) 10 MG tablet Take 1 tablet (10 mg total) by mouth at bedtime. 90 tablet 3   tamsulosin (FLOMAX) 0.4 MG CAPS capsule Take 1 capsule (0.4 mg total) by mouth daily. 30 capsule 11   Vitamin D, Ergocalciferol, (DRISDOL) 50000 UNITS CAPS capsule Take 50,000 Units by mouth every 7 (seven) days. Reported on 07/05/2015     lisinopril-hydrochlorothiazide (ZESTORETIC) 10-12.5 MG tablet Take 1 tablet by mouth daily. 90 tablet 3   No facility-administered medications prior to visit.    Allergies  Allergen Reactions   Shrimp [Shellfish Allergy]     intolerant   Valium [Diazepam] Itching    ROS Review of Systems    Objective:    Physical Exam HENT:     Head: Normocephalic and atraumatic.  Cardiovascular:     Rate and Rhythm: Normal rate.     Pulses: Normal pulses.  Pulmonary:     Effort: Pulmonary effort is normal.  Abdominal:     Palpations: Abdomen is soft.  Musculoskeletal:     Cervical back: Normal range of motion.  Skin:    General: Skin is warm.     Capillary Refill:  Capillary refill takes less than 2 seconds.  Neurological:     General: No focal deficit present.     Mental Status: He is alert and oriented to person, place, and time.  Psychiatric:        Mood and Affect: Mood normal.        Behavior: Behavior normal.  Thought Content: Thought content normal.        Judgment: Judgment normal.    BP (!) 144/75 (BP Location: Right Arm, Patient Position: Sitting)   Pulse 69   Temp (!) 97.3 F (36.3 C)   Wt 225 lb 0.5 oz (102.1 kg)   SpO2 99%   BMI 30.95 kg/m  Wt Readings from Last 3 Encounters:  11/19/20 225 lb 0.5 oz (102.1 kg)  10/20/20 234 lb 0.4 oz (106.2 kg)  10/05/20 237 lb 4 oz (107.6 kg)     There are no preventive care reminders to display for this patient.  There are no preventive care reminders to display for this patient.  Lab Results  Component Value Date   TSH 2.164 07/05/2015   Lab Results  Component Value Date   WBC 7.2 08/24/2020   HGB 14.5 08/24/2020   HCT 43.0 08/24/2020   MCV 86.3 08/24/2020   PLT 211.0 08/24/2020   Lab Results  Component Value Date   NA 136 08/24/2020   K 4.1 08/24/2020   CO2 28 08/24/2020   GLUCOSE 132 (H) 08/24/2020   BUN 10 08/24/2020   CREATININE 0.84 08/24/2020   BILITOT 0.3 08/24/2020   ALKPHOS 87 08/24/2020   AST 18 08/24/2020   ALT 21 08/24/2020   PROT 7.5 08/24/2020   ALBUMIN 4.4 08/24/2020   CALCIUM 9.5 08/24/2020   ANIONGAP 14 06/17/2019   GFR 93.22 08/24/2020   Lab Results  Component Value Date   CHOL 133 07/26/2020   Lab Results  Component Value Date   HDL 63 07/26/2020   Lab Results  Component Value Date   LDLCALC 51 07/26/2020   Lab Results  Component Value Date   TRIG 105 07/26/2020   Lab Results  Component Value Date   CHOLHDL 2.1 07/26/2020   Lab Results  Component Value Date   HGBA1C 7.2 (A) 10/20/2020   HGBA1C 7.2 10/20/2020   HGBA1C 7.2 (A) 10/20/2020   HGBA1C 7.2 (A) 10/20/2020      Assessment & Plan:   Problem List Items  Addressed This Visit   None Visit Diagnoses     Benign prostatic hyperplasia with urinary hesitancy    -  Primary Will start finasteride 5 mg to see if this will help with BPH symptoms.  If not effective patient will be referred to urology for further evaluation   Relevant Medications   finasteride (PROSCAR) 5 MG tablet   Hypertension due to endocrine disorder     Persistent with stage II we will increase lisinopril HCT to help minimize any complications due to medical history   Relevant Medications   lisinopril-hydrochlorothiazide (ZESTORETIC) 20-25 MG tablet       Meds ordered this encounter  Medications   finasteride (PROSCAR) 5 MG tablet    Sig: Take 1 tablet (5 mg total) by mouth daily.    Dispense:  90 tablet    Refill:  3    Order Specific Question:   Supervising Provider    Answer:   Quentin Angst [[id number]]   lisinopril-hydrochlorothiazide (ZESTORETIC) 20-25 MG tablet    Sig: Take 1 tablet by mouth daily.    Dispense:  90 tablet    Refill:  3    Order Specific Question:   Supervising Provider    Answer:   Quentin Angst [id number]    Follow-up: Return in about 6 weeks (around 12/31/2020) for Follow up HTN [id number] and nocturia.    Barbette Merino, NP

## 2020-11-23 ENCOUNTER — Encounter: Payer: Self-pay | Admitting: Nurse Practitioner

## 2020-11-30 DIAGNOSIS — G8929 Other chronic pain: Secondary | ICD-10-CM | POA: Diagnosis not present

## 2020-11-30 DIAGNOSIS — M179 Osteoarthritis of knee, unspecified: Secondary | ICD-10-CM | POA: Diagnosis not present

## 2020-11-30 DIAGNOSIS — M5030 Other cervical disc degeneration, unspecified cervical region: Secondary | ICD-10-CM | POA: Diagnosis not present

## 2020-11-30 DIAGNOSIS — M5136 Other intervertebral disc degeneration, lumbar region: Secondary | ICD-10-CM | POA: Diagnosis not present

## 2020-12-28 DIAGNOSIS — G894 Chronic pain syndrome: Secondary | ICD-10-CM | POA: Diagnosis not present

## 2020-12-28 DIAGNOSIS — M179 Osteoarthritis of knee, unspecified: Secondary | ICD-10-CM | POA: Diagnosis not present

## 2020-12-28 DIAGNOSIS — M1990 Unspecified osteoarthritis, unspecified site: Secondary | ICD-10-CM | POA: Diagnosis not present

## 2020-12-28 DIAGNOSIS — M5030 Other cervical disc degeneration, unspecified cervical region: Secondary | ICD-10-CM | POA: Diagnosis not present

## 2021-01-05 DIAGNOSIS — G894 Chronic pain syndrome: Secondary | ICD-10-CM | POA: Diagnosis not present

## 2021-01-10 ENCOUNTER — Encounter: Payer: Self-pay | Admitting: Nurse Practitioner

## 2021-01-10 ENCOUNTER — Ambulatory Visit (INDEPENDENT_AMBULATORY_CARE_PROVIDER_SITE_OTHER): Payer: Medicare Other | Admitting: Nurse Practitioner

## 2021-01-10 ENCOUNTER — Other Ambulatory Visit: Payer: Self-pay

## 2021-01-10 VITALS — BP 138/72 | HR 90 | Temp 97.3°F | Ht 71.5 in | Wt 225.5 lb

## 2021-01-10 DIAGNOSIS — R3911 Hesitancy of micturition: Secondary | ICD-10-CM

## 2021-01-10 DIAGNOSIS — N401 Enlarged prostate with lower urinary tract symptoms: Secondary | ICD-10-CM | POA: Diagnosis not present

## 2021-01-10 DIAGNOSIS — K219 Gastro-esophageal reflux disease without esophagitis: Secondary | ICD-10-CM | POA: Diagnosis not present

## 2021-01-10 NOTE — Progress Notes (Signed)
[facility] Patient [facility] [address] [place], [place]  [address] Phone:  [phone]   Fax:  [phone]   Established Patient Office Visit  Subjective:  Patient ID: Riley Clarke, male    DOB: 12-09-1955  Age: 65 y.o. MRN: [id number]  CC:  Chief Complaint  Patient presents with   Follow-up    6 week follow up; pain management started in Cymbalta and Nucynta caused headaches, dizziness stopped taking.     HPI Riley Clarke presents for follow up. She  has a past medical history of Arthritis, Chronic back pain, Diabetes mellitus without complication (HCC), GERD (gastroesophageal reflux disease), Headache(784.0), HLD (hyperlipidemia), Hypertension, and Vitamin D deficiency.   He reports establishing care with pain management. He was started on Nucynta and Cymblata and did not tolerate this well. He has discontinued the medication. He will follow up in a few weeks.   He is following up today for his BPH with urinary hesitancy. He was started on Finasteride 5 mg. He continues to experience nocturia with straining and weak stream. He has not noticed much improvement.   Past Medical History:  Diagnosis Date   Arthritis    Chronic back pain    on chronic opioids   Diabetes mellitus without complication (HCC)    GERD (gastroesophageal reflux disease)    Headache(784.0)    HLD (hyperlipidemia)    Hypertension    no meds   Vitamin D deficiency     Past Surgical History:  Procedure Laterality Date   CERVICAL DISC SURGERY  99   LUMBAR FUSION     ROTATOR CUFF REPAIR Bilateral    SHOULDER ARTHROSCOPY WITH ROTATOR CUFF REPAIR AND SUBACROMIAL DECOMPRESSION Right 12/30/2018   Procedure: SHOULDER ARTHROSCOPY WITH ROTATOR CUFF REPAIR AND SUBACROMIAL DECOMPRESSION, DISTAL CLAVICLE EXICISION,;  Surgeon: Bjorn Pippin, MD;  Location: [facility];  Service: Orthopedics;  Laterality: Right;   SHOULDER ARTHROSCOPY WITH SUBACROMIAL DECOMPRESSION, ROTATOR CUFF REPAIR  AND BICEP TENDON REPAIR Left 06/19/2019   Procedure: LEFT SHOULDER ARTHROSCOPY, EXTENTSIVE DEBRIDEMENT, DISTAL CLAVICULECTOMY, SUBACROMIAL DECOMPRESSION, PARTIAL ACROMIOPLASTY WITH CORACOACROMIAL RELEASE, ROTATOR CUFFF REPAIR AND BICEP TENODESIS;  Surgeon: Bjorn Pippin, MD;  Location: [facility];  Service: Orthopedics;  Laterality: Left;    Family History  Problem Relation Age of Onset   Heart disease Mother    Diabetes Mother    Amblyopia Neg Hx    Blindness Neg Hx    Cataracts Neg Hx    Hypertension Neg Hx    Macular degeneration Neg Hx    Retinal detachment Neg Hx    Strabismus Neg Hx    Esophageal cancer Neg Hx    Colon cancer Neg Hx    Pancreatic cancer Neg Hx    Ulcerative colitis Neg Hx    Inflammatory bowel disease Neg Hx    Liver disease Neg Hx    Rectal cancer Neg Hx    Stomach cancer Neg Hx     Social History   Socioeconomic History   Marital status: Single    Spouse name: Not on file   Number of children: 3   Years of education: Not on file   Highest education level: Not on file  Occupational History   Occupation: disability  Tobacco Use   Smoking status: Former    Packs/day: 0.25    Years: 25.00    Pack years: 6.25    Types: Cigarettes    Quit date: 04/26/2020    Years  since quitting: 0.7   Smokeless tobacco: Never   Tobacco comments:    1 cig per day  Vaping Use   Vaping Use: Never used  Substance and Sexual Activity   Alcohol use: Not Currently    Alcohol/week: 4.0 standard drinks    Types: 4 Cans of beer per week    Comment: twice a week   Drug use: No   Sexual activity: Not on file  Other Topics Concern   Not on file  Social History Narrative   Right handed   Lives alone in a one story home   Social Determinants of Health   Financial Resource Strain: Not on file  Food Insecurity: Not on file  Transportation Needs: Not on file  Physical Activity: Not on file  Stress: Not on file  Social Connections: Not on file   Intimate Partner Violence: Not on file    Outpatient Medications Prior to Visit  Medication Sig Dispense Refill   ferrous sulfate 325 (65 FE) MG tablet Take 325 mg by mouth daily with breakfast.     finasteride (PROSCAR) 5 MG tablet Take 1 tablet (5 mg total) by mouth daily. 90 tablet 3   glucose blood (ACCU-CHEK AVIVA) test strip Use as instructed 100 each 12   lisinopril-hydrochlorothiazide (ZESTORETIC) 20-25 MG tablet Take 1 tablet by mouth daily. 90 tablet 3   meloxicam (MOBIC) 15 MG tablet Take 15 mg by mouth daily.     simvastatin (ZOCOR) 10 MG tablet Take 1 tablet (10 mg total) by mouth at bedtime. 90 tablet 3   tamsulosin (FLOMAX) 0.4 MG CAPS capsule Take 1 capsule (0.4 mg total) by mouth daily. 30 capsule 11   Vitamin D, Ergocalciferol, (DRISDOL) 50000 UNITS CAPS capsule Take 50,000 Units by mouth every 7 (seven) days. Reported on 07/05/2015     Dulaglutide (TRULICITY) 1.5 MG/0.5ML SOPN INJECT 0.5 MILLILITERS INTO THE SKIN ONCE A WEEK 2 mL 3   esomeprazole (NEXIUM) 40 MG capsule TAKE ONE (1) CAPSULE BY MOUTH EVERY MORNING BEFORE BREAKFAST 90 capsule 1   gabapentin (NEURONTIN) 300 MG capsule TAKE 1 CAPSULE BY MOUTH THREE TIMES A DAY 90 capsule 3   JARDIANCE 25 MG TABS tablet TAKE ONE (1) TABLET BY MOUTH EVERY DAY 90 tablet 3   linagliptin (TRADJENTA) 5 MG TABS tablet Take 1 tablet (5 mg total) by mouth daily. 30 tablet 3   metFORMIN (GLUCOPHAGE) 1000 MG tablet TAKE 1 TABLET BY MOUTH 2 TIMES DAILY WITH A MEAL. 180 tablet 1   No facility-administered medications prior to visit.    Allergies  Allergen Reactions   Shrimp [Shellfish Allergy]     intolerant   Valium [Diazepam] Itching    ROS Review of Systems    Objective:    Physical Exam HENT:     Head: Normocephalic and atraumatic.  Cardiovascular:     Rate and Rhythm: Normal rate.     Pulses: Normal pulses.  Pulmonary:     Effort: Pulmonary effort is normal.  Abdominal:     Palpations: Abdomen is soft.   Musculoskeletal:     Cervical back: Normal range of motion.  Skin:    General: Skin is warm.     Capillary Refill: Capillary refill takes less than 2 seconds.  Neurological:     General: No focal deficit present.     Mental Status: He is alert and oriented to person, place, and time.  Psychiatric:        Mood and Affect: Mood normal.  Behavior: Behavior normal.        Thought Content: Thought content normal.        Judgment: Judgment normal.    BP 138/72 (BP Location: Right Arm, Patient Position: Sitting)   Pulse 90   Temp (!) 97.3 F (36.3 C)   Ht 5' 11.5" (1.816 m)   Wt 225 lb 8 oz (102.3 kg)   SpO2 95%   BMI 31.01 kg/m  Wt Readings from Last 3 Encounters:  01/10/21 225 lb 8 oz (102.3 kg)  11/19/20 225 lb 0.5 oz (102.1 kg)  10/20/20 234 lb 0.4 oz (106.2 kg)     Health Maintenance Due  Topic Date Due   OPHTHALMOLOGY EXAM  03/23/2019   COVID-19 Vaccine (4 - Booster for Moderna series) 08/04/2020   INFLUENZA VACCINE  01/10/2021    There are no preventive care reminders to display for this patient.  Lab Results  Component Value Date   TSH 2.164 07/05/2015   Lab Results  Component Value Date   WBC 7.2 08/24/2020   HGB 14.5 08/24/2020   HCT 43.0 08/24/2020   MCV 86.3 08/24/2020   PLT 211.0 08/24/2020   Lab Results  Component Value Date   NA 136 08/24/2020   K 4.1 08/24/2020   CO2 28 08/24/2020   GLUCOSE 132 (H) 08/24/2020   BUN 10 08/24/2020   CREATININE 0.84 08/24/2020   BILITOT 0.3 08/24/2020   ALKPHOS 87 08/24/2020   AST 18 08/24/2020   ALT 21 08/24/2020   PROT 7.5 08/24/2020   ALBUMIN 4.4 08/24/2020   CALCIUM 9.5 08/24/2020   ANIONGAP 14 06/17/2019   GFR 93.22 08/24/2020   Lab Results  Component Value Date   CHOL 133 07/26/2020   Lab Results  Component Value Date   HDL 63 07/26/2020   Lab Results  Component Value Date   LDLCALC 51 07/26/2020   Lab Results  Component Value Date   TRIG 105 07/26/2020   Lab Results  Component  Value Date   CHOLHDL 2.1 07/26/2020   Lab Results  Component Value Date   HGBA1C 7.2 (A) 10/20/2020   HGBA1C 7.2 10/20/2020   HGBA1C 7.2 (A) 10/20/2020   HGBA1C 7.2 (A) 10/20/2020      Assessment & Plan:   Problem List Items Addressed This Visit   None Visit Diagnoses     Benign prostatic hyperplasia with urinary hesitancy    -  Primary Persistent   No improvement on finasteride and tamsulosin    Relevant Orders   Ambulatory referral to Urology   Gastroesophageal reflux disease       Relevant Medications   esomeprazole (NEXIUM) 40 MG capsule       Meds ordered this encounter  Medications   gabapentin (NEURONTIN) 300 MG capsule    Sig: Take 1 capsule (300 mg total) by mouth 3 (three) times daily.    Dispense:  270 capsule    Refill:  3    Order Specific Question:   Supervising Provider    Answer:   Quentin AngstJEGEDE, OLUGBEMIGA E [[id number]]   esomeprazole (NEXIUM) 40 MG capsule    Sig: TAKE ONE (1) CAPSULE BY MOUTH EVERY MORNING BEFORE BREAKFAST    Dispense:  90 capsule    Refill:  1    Order Specific Question:   Supervising Provider    Answer:   Quentin AngstJEGEDE, OLUGBEMIGA E [[id number]]   empagliflozin (JARDIANCE) 25 MG TABS tablet    Sig: Take 1 tablet (25 mg total) by mouth daily.  Dispense:  90 tablet    Refill:  3    Order Specific Question:   Supervising Provider    Answer:   Quentin Angst [[id number]]   linagliptin (TRADJENTA) 5 MG TABS tablet    Sig: Take 1 tablet (5 mg total) by mouth daily.    Dispense:  90 tablet    Refill:  3    Order Specific Question:   Supervising Provider    Answer:   Quentin Angst [[id number]]   metFORMIN (GLUCOPHAGE) 1000 MG tablet    Sig: Take 1 tablet (1,000 mg total) by mouth 2 (two) times daily with a meal. TAKE 1 TABLET BY MOUTH 2 TIMES DAILY WITH A MEAL.    Dispense:  180 tablet    Refill:  3    Order Specific Question:   Supervising Provider    Answer:   Quentin Angst [id number]    Follow-up: Return in about 2 months  (around 03/12/2021) for follow up [id number].    Barbette Merino, NP

## 2021-01-10 NOTE — Patient Instructions (Signed)
Benign Prostatic Hyperplasia  Benign prostatic hyperplasia (BPH) is an enlarged prostate gland that is caused by the normal aging process and not by cancer. The prostate is a walnut-sized gland that is involved in the production of semen. It is located in front of the rectum and below the bladder. The bladder stores urine and the urethra is the tube that carries the urine out of the body. The prostate may get bigger asa man gets older. An enlarged prostate can press on the urethra. This can make it harder to pass urine. The build-up of urine in the bladder can cause infection. Back pressure and infection may progress to bladder damage and kidney (renal) failure. What are the causes? This condition is part of a normal aging process. However, not all men develop problems from this condition. If the prostate enlarges away from the urethra, urine flow will not be blocked. If it enlarges toward the urethra andcompresses it, there will be problems passing urine. What increases the risk? This condition is more likely to develop in men over the age of 50 years. What are the signs or symptoms? Symptoms of this condition include: Getting up often during the night to urinate. Needing to urinate frequently during the day. Difficulty starting urine flow. Decrease in size and strength of your urine stream. Leaking (dribbling) after urinating. Inability to pass urine. This needs immediate treatment. Inability to completely empty your bladder. Pain when you pass urine. This is more common if there is also an infection. Urinary tract infection (UTI). How is this diagnosed? This condition is diagnosed based on your medical history, a physical exam, and your symptoms. Tests will also be done, such as: A post-void bladder scan. This measures any amount of urine that may remain in your bladder after you finish urinating. A digital rectal exam. In a rectal exam, your health care provider checks your prostate by  putting a lubricated, gloved finger into your rectum to feel the back of your prostate gland. This exam detects the size of your gland and any abnormal lumps or growths. An exam of your urine (urinalysis). A prostate specific antigen (PSA) screening. This is a blood test used to screen for prostate cancer. An ultrasound. This test uses sound waves to electronically produce a picture of your prostate gland. Your health care provider may refer you to a specialist in kidney and prostate diseases (urologist). How is this treated? Once symptoms begin, your health care provider will monitor your condition (active surveillance or watchful waiting). Treatment for this condition will depend on the severity of your condition. Treatment may include: Observation and yearly exams. This may be the only treatment needed if your condition and symptoms are mild. Medicines to relieve your symptoms, including: Medicines to shrink the prostate. Medicines to relax the muscle of the prostate. Surgery in severe cases. Surgery may include: Prostatectomy. In this procedure, the prostate tissue is removed completely through an open incision or with a laparoscope or robotics. Transurethral resection of the prostate (TURP). In this procedure, a tool is inserted through the opening at the tip of the penis (urethra). It is used to cut away tissue of the inner core of the prostate. The pieces are removed through the same opening of the penis. This removes the blockage. Transurethral incision (TUIP). In this procedure, small cuts are made in the prostate. This lessens the prostate's pressure on the urethra. Transurethral microwave thermotherapy (TUMT). This procedure uses microwaves to create heat. The heat destroys and removes a small   amount of prostate tissue. Transurethral needle ablation (TUNA). This procedure uses radio frequencies to destroy and remove a small amount of prostate tissue. Interstitial laser coagulation (ILC).  This procedure uses a laser to destroy and remove a small amount of prostate tissue. Transurethral electrovaporization (TUVP). This procedure uses electrodes to destroy and remove a small amount of prostate tissue. Prostatic urethral lift. This procedure inserts an implant to push the lobes of the prostate away from the urethra. Follow these instructions at home: Take over-the-counter and prescription medicines only as told by your health care provider. Monitor your symptoms for any changes. Contact your health care provider with any changes. Avoid drinking large amounts of liquid before going to bed or out in public. Avoid or reduce how much caffeine or alcohol you drink. Give yourself time when you urinate. Keep all follow-up visits as told by your health care provider. This is important. Contact a health care provider if: You have unexplained back pain. Your symptoms do not get better with treatment. You develop side effects from the medicine you are taking. Your urine becomes very dark or has a bad smell. Your lower abdomen becomes distended and you have trouble passing your urine. Get help right away if: You have a fever or chills. You suddenly cannot urinate. You feel lightheaded, or very dizzy, or you faint. There are large amounts of blood or clots in the urine. Your urinary problems become hard to manage. You develop moderate to severe low back or flank pain. The flank is the side of your body between the ribs and the hip. These symptoms may represent a serious problem that is an emergency. Do not wait to see if the symptoms will go away. Get medical help right away. Call your local emergency services (911 in the [place] Do not drive yourself to the hospital. Summary Benign prostatic hyperplasia (BPH) is an enlarged prostate that is caused by the normal aging process and not by cancer. An enlarged prostate can press on the urethra. This can make it hard to pass urine. This  condition is part of a normal aging process and is more likely to develop in men over the age of 50 years. Get help right away if you suddenly cannot urinate. This information is not intended to replace advice given to you by your health care provider. Make sure you discuss any questions you have with your healthcare provider. Document Revised: 02/05/2020 Document Reviewed: 02/05/2020 Elsevier Patient Education  2022 [employer] Inc.  

## 2021-01-11 MED ORDER — METFORMIN HCL 1000 MG PO TABS: 1000.0000 mg | ORAL_TABLET | Freq: Two times a day (BID) | ORAL | 3 refills | Status: DC

## 2021-01-11 MED ORDER — ESOMEPRAZOLE MAGNESIUM 40 MG PO CPDR: DELAYED_RELEASE_CAPSULE | ORAL | 1 refills | Status: DC

## 2021-01-11 MED ORDER — LINAGLIPTIN 5 MG PO TABS: 5.0000 mg | ORAL_TABLET | Freq: Every day | ORAL | 3 refills | Status: DC

## 2021-01-11 MED ORDER — EMPAGLIFLOZIN 25 MG PO TABS: 25.0000 mg | ORAL_TABLET | Freq: Every day | ORAL | 3 refills | Status: DC

## 2021-01-11 MED ORDER — GABAPENTIN 300 MG PO CAPS: 300.0000 mg | ORAL_CAPSULE | Freq: Three times a day (TID) | ORAL | 3 refills | Status: DC

## 2021-01-20 ENCOUNTER — Ambulatory Visit (INDEPENDENT_AMBULATORY_CARE_PROVIDER_SITE_OTHER): Payer: Medicare Other | Admitting: Podiatry

## 2021-01-20 ENCOUNTER — Other Ambulatory Visit: Payer: Self-pay

## 2021-01-20 ENCOUNTER — Ambulatory Visit (INDEPENDENT_AMBULATORY_CARE_PROVIDER_SITE_OTHER): Payer: Medicare Other

## 2021-01-20 ENCOUNTER — Encounter: Payer: Self-pay | Admitting: Podiatry

## 2021-01-20 DIAGNOSIS — M722 Plantar fascial fibromatosis: Secondary | ICD-10-CM

## 2021-01-20 MED ORDER — TRIAMCINOLONE ACETONIDE 10 MG/ML IJ SUSP
20.0000 mg | Freq: Once | INTRAMUSCULAR | Status: AC
  Administered 2021-01-20: 20 mg

## 2021-01-20 NOTE — Progress Notes (Signed)
Subjective:   Patient ID: Riley Clarke, male   DOB: 65 y.o.   MRN: [id number]   HPI Patient presents stating he feels like he has had an intensification of his heel pain on both feet and he desperately wants to avoid surgery and is looking for any way we can do it.  Patient does state that it started about 2 weeks ago   ROS      Objective:  Physical Exam  Neurovascular status intact with acute discomfort plantar aspect heel region bilateral at the insertion calcaneus with patient stretching utilizing night splint but only having 1 and not able to adequately since stretch both feet     Assessment:  Acute Planter fasciitis bilateral that is not responding conservatively after periods of time     Plan:  H&P reviewed condition with patient.  I do think we want to try to avoid surgery due to his numerous other health issues and this seems to be the best way and I did dispense him a second night splint so hopefully he can use to at the same time for stretch.  I did do sterile prep and reinjected the fascia 3 mg Kenalog 5 mg liken bilateral and began encouraging importance of stretch at this time.  Reappoint to recheck

## 2021-01-25 DIAGNOSIS — M179 Osteoarthritis of knee, unspecified: Secondary | ICD-10-CM | POA: Diagnosis not present

## 2021-01-25 DIAGNOSIS — M5136 Other intervertebral disc degeneration, lumbar region: Secondary | ICD-10-CM | POA: Diagnosis not present

## 2021-01-25 DIAGNOSIS — M5030 Other cervical disc degeneration, unspecified cervical region: Secondary | ICD-10-CM | POA: Diagnosis not present

## 2021-01-25 DIAGNOSIS — G8929 Other chronic pain: Secondary | ICD-10-CM | POA: Diagnosis not present

## 2021-01-26 ENCOUNTER — Ambulatory Visit: Payer: Medicare Other | Admitting: Family

## 2021-02-03 DIAGNOSIS — M47816 Spondylosis without myelopathy or radiculopathy, lumbar region: Secondary | ICD-10-CM | POA: Diagnosis not present

## 2021-02-03 DIAGNOSIS — M546 Pain in thoracic spine: Secondary | ICD-10-CM | POA: Diagnosis not present

## 2021-02-03 DIAGNOSIS — G8929 Other chronic pain: Secondary | ICD-10-CM | POA: Diagnosis not present

## 2021-02-03 DIAGNOSIS — Z981 Arthrodesis status: Secondary | ICD-10-CM | POA: Diagnosis not present

## 2021-02-04 DIAGNOSIS — M545 Low back pain, unspecified: Secondary | ICD-10-CM | POA: Diagnosis not present

## 2021-02-04 DIAGNOSIS — G894 Chronic pain syndrome: Secondary | ICD-10-CM | POA: Diagnosis not present

## 2021-02-09 DIAGNOSIS — M546 Pain in thoracic spine: Secondary | ICD-10-CM | POA: Diagnosis not present

## 2021-02-09 DIAGNOSIS — M545 Low back pain, unspecified: Secondary | ICD-10-CM | POA: Diagnosis not present

## 2021-02-09 DIAGNOSIS — M47816 Spondylosis without myelopathy or radiculopathy, lumbar region: Secondary | ICD-10-CM | POA: Diagnosis not present

## 2021-02-09 DIAGNOSIS — Z7689 Persons encountering health services in other specified circumstances: Secondary | ICD-10-CM | POA: Diagnosis not present

## 2021-02-17 DIAGNOSIS — M48062 Spinal stenosis, lumbar region with neurogenic claudication: Secondary | ICD-10-CM | POA: Diagnosis not present

## 2021-02-17 DIAGNOSIS — R030 Elevated blood-pressure reading, without diagnosis of hypertension: Secondary | ICD-10-CM | POA: Diagnosis not present

## 2021-02-18 ENCOUNTER — Other Ambulatory Visit: Payer: Self-pay | Admitting: Neurosurgery

## 2021-02-20 ENCOUNTER — Telehealth: Payer: Self-pay

## 2021-02-20 NOTE — Telephone Encounter (Signed)
02/20/2021 Name: Riley Clarke MRN: [id number] DOB: 1955-11-23 Successful outbound call made today to assist with:   scheduling AWV  Outreach Attempt:  1st Attempt  Patient accepts appointment 02/22/21 at 130p  [facility]

## 2021-02-22 ENCOUNTER — Ambulatory Visit: Payer: Medicare Other

## 2021-02-22 DIAGNOSIS — G8929 Other chronic pain: Secondary | ICD-10-CM | POA: Diagnosis not present

## 2021-02-22 DIAGNOSIS — M5136 Other intervertebral disc degeneration, lumbar region: Secondary | ICD-10-CM | POA: Diagnosis not present

## 2021-02-22 DIAGNOSIS — M1990 Unspecified osteoarthritis, unspecified site: Secondary | ICD-10-CM | POA: Diagnosis not present

## 2021-02-22 DIAGNOSIS — M5030 Other cervical disc degeneration, unspecified cervical region: Secondary | ICD-10-CM | POA: Diagnosis not present

## 2021-02-24 ENCOUNTER — Ambulatory Visit (INDEPENDENT_AMBULATORY_CARE_PROVIDER_SITE_OTHER): Payer: Medicare Other

## 2021-02-24 ENCOUNTER — Other Ambulatory Visit: Payer: Self-pay

## 2021-02-24 DIAGNOSIS — Z0000 Encounter for general adult medical examination without abnormal findings: Secondary | ICD-10-CM

## 2021-02-24 NOTE — Patient Instructions (Signed)
Health Maintenance, Male Adopting a healthy lifestyle and getting preventive care are important in promoting health and wellness. Ask your health care provider about: The right schedule for you to have regular tests and exams. Things you can do on your own to prevent diseases and keep yourself healthy. What should I know about diet, weight, and exercise? Eat a healthy diet  Eat a diet that includes plenty of vegetables, fruits, low-fat dairy products, and lean protein. Do not eat a lot of foods that are high in solid fats, added sugars, or sodium. Maintain a healthy weight Body mass index (BMI) is a measurement that can be used to identify possible weight problems. It estimates body fat based on height and weight. Your health care provider can help determine your BMI and help you achieve or maintain a healthy weight. Get regular exercise Get regular exercise. This is one of the most important things you can do for your health. Most adults should: Exercise for at least 150 minutes each week. The exercise should increase your heart rate and make you sweat (moderate-intensity exercise). Do strengthening exercises at least twice a week. This is in addition to the moderate-intensity exercise. Spend less time sitting. Even light physical activity can be beneficial. Watch cholesterol and blood lipids Have your blood tested for lipids and cholesterol at 63 years of age, then have this test every 5 years. You may need to have your cholesterol levels checked more often if: Your lipid or cholesterol levels are high. You are older than 63 years of age. You are at high risk for heart disease. What should I know about cancer screening? Many types of cancers can be detected early and may often be prevented. Depending on your health history and family history, you may need to have cancer screening at various ages. This may include screening for: Colorectal cancer. Prostate cancer. Skin cancer. Lung  cancer. What should I know about heart disease, diabetes, and high blood pressure? Blood pressure and heart disease High blood pressure causes heart disease and increases the risk of stroke. This is more likely to develop in people who have high blood pressure readings, are of African descent, or are overweight. Talk with your health care provider about your target blood pressure readings. Have your blood pressure checked: Every 3-5 years if you are 18-39 years of age. Every year if you are 40 years old or older. If you are between the ages of 65 and 75 and are a current or former smoker, ask your health care provider if you should have a one-time screening for abdominal aortic aneurysm (AAA). Diabetes Have regular diabetes screenings. This checks your fasting blood sugar level. Have the screening done: Once every three years after age 45 if you are at a normal weight and have a low risk for diabetes. More often and at a younger age if you are overweight or have a high risk for diabetes. What should I know about preventing infection? Hepatitis B If you have a higher risk for hepatitis B, you should be screened for this virus. Talk with your health care provider to find out if you are at risk for hepatitis B infection. Hepatitis C Blood testing is recommended for: Everyone born from 1945 through 1965. Anyone with known risk factors for hepatitis C. Sexually transmitted infections (STIs) You should be screened each year for STIs, including gonorrhea and chlamydia, if: You are sexually active and are younger than 63 years of age. You are older than 63 years   of age and your health care provider tells you that you are at risk for this type of infection. Your sexual activity has changed since you were last screened, and you are at increased risk for chlamydia or gonorrhea. Ask your health care provider if you are at risk. Ask your health care provider about whether you are at high risk for HIV.  Your health care provider may recommend a prescription medicine to help prevent HIV infection. If you choose to take medicine to prevent HIV, you should first get tested for HIV. You should then be tested every 3 months for as long as you are taking the medicine. Follow these instructions at home: Lifestyle Do not use any products that contain nicotine or tobacco, such as cigarettes, e-cigarettes, and chewing tobacco. If you need help quitting, ask your health care provider. Do not use street drugs. Do not share needles. Ask your health care provider for help if you need support or information about quitting drugs. Alcohol use Do not drink alcohol if your health care provider tells you not to drink. If you drink alcohol: Limit how much you have to 0-2 drinks a day. Be aware of how much alcohol is in your drink. In the [place] one drink equals one 12 oz bottle of beer (355 mL), one 5 oz glass of wine (148 mL), or one 1 oz glass of hard liquor (44 mL). General instructions Schedule regular health, dental, and eye exams. Stay current with your vaccines. Tell your health care provider if: You often feel depressed. You have ever been abused or do not feel safe at home. Summary Adopting a healthy lifestyle and getting preventive care are important in promoting health and wellness. Follow your health care provider's instructions about healthy diet, exercising, and getting tested or screened for diseases. Follow your health care provider's instructions on monitoring your cholesterol and blood pressure. This information is not intended to replace advice given to you by your health care provider. Make sure you discuss any questions you have with your health care provider. Document Revised: 08/06/2020 Document Reviewed: 05/22/2018 Elsevier Patient Education  2022 Elsevier Inc.  

## 2021-02-24 NOTE — Progress Notes (Signed)
Subjective:   Riley Clarke is a 65 y.o. male who presents for an Initial Medicare Annual Wellness Visit. I connected with  Buford Bedsole Takagi on 02/24/21 by audio enabled telemedicine application and verified that I am speaking with the correct person using two identifiers.   I discussed the limitations of evaluation and management by telemedicine. The patient expressed understanding and agreed to proceed.   Location of Patient: Home Location of Provider: Office   List any persons and their role that are participating in the visit with the patient: Jannifer Franklin, LPN   Review of Systems    Defer to PCP       Objective:    There were no vitals filed for this visit. There is no height or weight on file to calculate BMI.  Advanced Directives 10/13/2019 09/15/2019 06/19/2019 12/30/2018 12/23/2018 08/14/2016 05/08/2016  Does Patient Have a Medical Advance Directive? No No No No No No No  Would patient like information on creating a medical advance directive? - No - Patient declined No - Patient declined No - Patient declined No - Patient declined - -  Some encounter information is confidential and restricted. Go to Review Flowsheets activity to see all data.    Current Medications (verified) Outpatient Encounter Medications as of 02/24/2021  Medication Sig   Cholecalciferol (QC VITAMIN D3) 50 MCG (2000 UT) TABS Take 2,000 Units by mouth in the morning.   empagliflozin (JARDIANCE) 25 MG TABS tablet Take 1 tablet (25 mg total) by mouth daily.   esomeprazole (NEXIUM) 40 MG capsule TAKE ONE (1) CAPSULE BY MOUTH EVERY MORNING BEFORE BREAKFAST   finasteride (PROSCAR) 5 MG tablet Take 1 tablet (5 mg total) by mouth daily. (Patient not taking: Reported on 02/24/2021)   gabapentin (NEURONTIN) 300 MG capsule Take 1 capsule (300 mg total) by mouth 3 (three) times daily.   glucose blood (ACCU-CHEK AVIVA) test strip Use as instructed   linagliptin (TRADJENTA) 5 MG TABS tablet Take 1 tablet (5 mg  total) by mouth daily.   lisinopril-hydrochlorothiazide (ZESTORETIC) 20-25 MG tablet Take 1 tablet by mouth daily. (Patient not taking: Reported on 02/24/2021)   metFORMIN (GLUCOPHAGE) 1000 MG tablet Take 1 tablet (1,000 mg total) by mouth 2 (two) times daily with a meal. TAKE 1 TABLET BY MOUTH 2 TIMES DAILY WITH A MEAL.   simvastatin (ZOCOR) 10 MG tablet Take 1 tablet (10 mg total) by mouth at bedtime. (Patient taking differently: Take 10 mg by mouth in the morning.)   tamsulosin (FLOMAX) 0.4 MG CAPS capsule Take 1 capsule (0.4 mg total) by mouth daily. (Patient not taking: Reported on 02/24/2021)   TRULICITY 1.5 MG/0.5ML SOPN Inject 1.5 mg into the skin every Monday.   vitamin B-12 (CYANOCOBALAMIN) 500 MCG tablet Take 500 mcg by mouth in the morning.   XTAMPZA ER 9 MG C12A Take 9 mg by mouth every 12 (twelve) hours.   No facility-administered encounter medications on file as of 02/24/2021.    Allergies (verified) Cymbalta [duloxetine hcl], Shrimp [shellfish allergy], and Valium [diazepam]   History: Past Medical History:  Diagnosis Date   Arthritis    Chronic back pain    on chronic opioids   Diabetes mellitus without complication (HCC)    GERD (gastroesophageal reflux disease)    Headache(784.0)    HLD (hyperlipidemia)    Hypertension    no meds   Vitamin D deficiency    Past Surgical History:  Procedure Laterality Date   CERVICAL DISC SURGERY  99  LUMBAR FUSION     ROTATOR CUFF REPAIR Bilateral    SHOULDER ARTHROSCOPY WITH ROTATOR CUFF REPAIR AND SUBACROMIAL DECOMPRESSION Right 12/30/2018   Procedure: SHOULDER ARTHROSCOPY WITH ROTATOR CUFF REPAIR AND SUBACROMIAL DECOMPRESSION, DISTAL CLAVICLE EXICISION,;  Surgeon: Bjorn Pippin, MD;  Location: [facility];  Service: Orthopedics;  Laterality: Right;   SHOULDER ARTHROSCOPY WITH SUBACROMIAL DECOMPRESSION, ROTATOR CUFF REPAIR AND BICEP TENDON REPAIR Left 06/19/2019   Procedure: LEFT SHOULDER ARTHROSCOPY, EXTENTSIVE  DEBRIDEMENT, DISTAL CLAVICULECTOMY, SUBACROMIAL DECOMPRESSION, PARTIAL ACROMIOPLASTY WITH CORACOACROMIAL RELEASE, ROTATOR CUFFF REPAIR AND BICEP TENODESIS;  Surgeon: Bjorn Pippin, MD;  Location: [facility];  Service: Orthopedics;  Laterality: Left;   Family History  Problem Relation Age of Onset   Heart disease Mother    Diabetes Mother    Amblyopia Neg Hx    Blindness Neg Hx    Cataracts Neg Hx    Hypertension Neg Hx    Macular degeneration Neg Hx    Retinal detachment Neg Hx    Strabismus Neg Hx    Esophageal cancer Neg Hx    Colon cancer Neg Hx    Pancreatic cancer Neg Hx    Ulcerative colitis Neg Hx    Inflammatory bowel disease Neg Hx    Liver disease Neg Hx    Rectal cancer Neg Hx    Stomach cancer Neg Hx    Social History   Socioeconomic History   Marital status: Single    Spouse name: Not on file   Number of children: 3   Years of education: Not on file   Highest education level: Not on file  Occupational History   Occupation: disability  Tobacco Use   Smoking status: Former    Packs/day: 0.25    Years: 25.00    Pack years: 6.25    Types: Cigarettes    Quit date: 04/26/2020    Years since quitting: 0.8   Smokeless tobacco: Never   Tobacco comments:    1 cig per day  Vaping Use   Vaping Use: Never used  Substance and Sexual Activity   Alcohol use: Not Currently    Alcohol/week: 4.0 standard drinks    Types: 4 Cans of beer per week    Comment: twice a week   Drug use: No   Sexual activity: Not on file  Other Topics Concern   Not on file  Social History Narrative   Right handed   Lives alone in a one story home   Social Determinants of Health   Financial Resource Strain: Not on file  Food Insecurity: Not on file  Transportation Needs: Not on file  Physical Activity: Not on file  Stress: Not on file  Social Connections: Not on file    Tobacco Counseling Counseling given: Not Answered Tobacco comments: 1 cig per  day   Clinical Intake:                 Diabetic?Yes Nutrition Risk Assessment:  Has the patient had any N/V/D within the last 2 months?  No  Does the patient have any non-healing wounds?  No  Has the patient had any unintentional weight loss or weight gain?  No   Diabetes:  Is the patient diabetic?  Yes  If diabetic, was a CBG obtained today?   N/A Did the patient bring in their glucometer from home?   N/A How often do you monitor your CBG's? Pt checks once a day most days..   Financial Strains and  Diabetes Management:  Are you having any financial strains with the device, your supplies or your medication? No .  Does the patient want to be seen by Chronic Care Management for management of their diabetes?  No  Would the patient like to be referred to a Nutritionist or for Diabetic Management?  No   Diabetic Exams:  Diabetic Eye Exam: Overdue for diabetic eye exam. Pt has been advised about the importance in completing this exam. Patient advised to call and schedule an eye exam. Diabetic Foot Exam: Completed 01/20/2021           Activities of Daily Living No flowsheet data found.  Patient Care Team: Barbette Merino, NP as PCP - General (Adult Health Nurse Practitioner)  Indicate any recent Medical Services you may have received from other than [facility] providers in the past year (date may be approximate).     Assessment:   This is a routine wellness examination for Riley Clarke.  Hearing/Vision screen No results found.  Dietary issues and exercise activities discussed:     Goals Addressed   None   Depression Screen PHQ 2/9 Scores 10/20/2020 04/02/2018 01/09/2018 01/15/2017 09/28/2016 02/04/2016 10/14/2015  PHQ - 2 Score 0 0 0 0 0 0 0    Fall Risk Fall Risk  11/19/2020 10/20/2020 10/13/2019 04/02/2018 01/09/2018  Falls in the past year? 0 0 0 No Yes  Number falls in past yr: 0 0 0 - 1  Injury with Fall? 0 0 0 - No    FALL RISK PREVENTION PERTAINING TO THE  HOME:  Any stairs in or around the home? No  If so, are there any without handrails? No  Home free of loose throw rugs in walkways, pet beds, electrical cords, etc? Yes  Adequate lighting in your home to reduce risk of falls? Yes   ASSISTIVE DEVICES UTILIZED TO PREVENT FALLS:  Life alert? No  Use of a cane, walker or w/c? No  Grab bars in the bathroom? No  Shower chair or bench in shower? No  Elevated toilet seat or a handicapped toilet? No   TIMED UP AND GO:  Was the test performed?  N/A .  Length of time to ambulate 10 feet: N/A sec.     Cognitive Function:        Immunizations Immunization History  Administered Date(s) Administered   Influenza,inj,Quad PF,6+ Mos 04/01/2015, 02/04/2016, 02/19/2017, 04/02/2018   Moderna SARS-COV2 Booster Vaccination 05/04/2020   Moderna Sars-Covid-2 Vaccination 08/27/2019, 09/24/2019   Pneumococcal Conjugate-13 04/01/2015   Pneumococcal Polysaccharide-23 05/08/2016   Tdap 09/06/2014, 04/01/2015    TDAP status: Up to date  Flu Vaccine status: Up to date  Pneumococcal vaccine status: Due, Education has been provided regarding the importance of this vaccine. Advised may receive this vaccine at local pharmacy or Health Dept. Aware to provide a copy of the vaccination record if obtained from local pharmacy or Health Dept. Verbalized acceptance and understanding.  Covid-19 vaccine status: Information provided on how to obtain vaccines.   Qualifies for Shingles Vaccine? Yes   Zostavax completed No   Shingrix Completed?: No.    Education has been provided regarding the importance of this vaccine. Patient has been advised to call insurance company to determine out of pocket expense if they have not yet received this vaccine. Advised may also receive vaccine at local pharmacy or Health Dept. Verbalized acceptance and understanding.  Screening Tests Health Maintenance  Topic Date Due   Zoster Vaccines- Shingrix (1 of 2) Never done  OPHTHALMOLOGY EXAM  03/23/2019   COVID-19 Vaccine (4 - Booster for Moderna series) 07/27/2020   INFLUENZA VACCINE  01/10/2021   COLONOSCOPY (Pts 45-41yrs Insurance coverage will need to be confirmed)  07/28/2021 (Originally 10/10/2002)   Pneumococcal Vaccine 32-31 Years old (3 - PPSV23 or PCV20) 11/19/2021 (Originally 05/08/2021)   HEMOGLOBIN A1C  04/22/2021   FOOT EXAM  07/23/2021   TETANUS/TDAP  03/31/2025   PNEUMOCOCCAL POLYSACCHARIDE VACCINE AGE 39-64 HIGH RISK  Completed   Hepatitis C Screening  Completed   HIV Screening  Completed   HPV VACCINES  Aged Out    Health Maintenance  Health Maintenance Due  Topic Date Due   Zoster Vaccines- Shingrix (1 of 2) Never done   OPHTHALMOLOGY EXAM  03/23/2019   COVID-19 Vaccine (4 - Booster for Moderna series) 07/27/2020   INFLUENZA VACCINE  01/10/2021    Colorectal cancer screening: Type of screening: Colonoscopy. Completed 2016 at [facility]. Repeat every 10 years  Lung Cancer Screening: (Low Dose CT Chest recommended if Age 1-80 years, 30 pack-year currently smoking OR have quit w/in 15years.) does qualify.   Lung Cancer Screening Referral: Will discuss with PCP  Additional Screening:  Hepatitis C Screening: does qualify; Completed 07/05/2015  Vision Screening: Recommended annual ophthalmology exams for early detection of glaucoma and other disorders of the eye. Is the patient up to date with their annual eye exam?  No  Who is the provider or what is the name of the office in which the patient attends annual eye exams? [facility] If pt is not established with a provider, would they like to be referred to a provider to establish care?  Already Established .   Dental Screening: Recommended annual dental exams for proper oral hygiene  Community Resource Referral / Chronic Care Management: CRR required this visit?  No   CCM required this visit?  No      Plan:     I have personally reviewed and noted the following in the  patient's chart:   Medical and social history Use of alcohol, tobacco or illicit drugs  Current medications and supplements including opioid prescriptions. Patient is not currently taking opioid prescriptions. Functional ability and status Nutritional status Physical activity Advanced directives List of other physicians Hospitalizations, surgeries, and ER visits in previous 12 months Vitals Screenings to include cognitive, depression, and falls Referrals and appointments  In addition, I have reviewed and discussed with patient certain preventive protocols, quality metrics, and best practice recommendations. A written personalized care plan for preventive services as well as general preventive health recommendations were provided to patient.     Jannifer Franklin, LPN   12/18/6281   Nurse Notes: Non - Face to Face 45 min appointment   Riley Clarke , Thank you for taking time to come for your Medicare Wellness Visit. I appreciate your ongoing commitment to your health goals. Please review the following plan we discussed and let me know if I can assist you in the future.   These are the goals we discussed:  Goals   None     This is a list of the screening recommended for you and due dates:  Health Maintenance  Topic Date Due   Zoster (Shingles) Vaccine (1 of 2) Never done   Eye exam for diabetics  03/23/2019   COVID-19 Vaccine (4 - Booster for Moderna series) 07/27/2020   Flu Shot  01/10/2021   Colon Cancer Screening  07/28/2021*   Pneumococcal Vaccination (3 - PPSV23 or  PCV20) 11/19/2021*   Hemoglobin A1C  04/22/2021   Complete foot exam   07/23/2021   Tetanus Vaccine  03/31/2025   Pneumococcal vaccine  Completed   Hepatitis C Screening: USPSTF Recommendation to screen - Ages 40-79 yo.  Completed   HIV Screening  Completed   HPV Vaccine  Aged Out  *Topic was postponed. The date shown is not the original due date.

## 2021-03-01 NOTE — Pre-Procedure Instructions (Signed)
Surgical Instructions    Your procedure is scheduled on Wednesday, September 28th, 2022.  Report to [facility] Main Entrance "A" at 06:00 A.M., then check in with the Admitting office.  Call this number if you have problems the morning of surgery:  [phone]   If you have any questions prior to your surgery date call [phone]: Open Monday-Friday 8am-4pm    Remember:  Do not eat or drink after midnight the night before your surgery    Take these medicines the morning of surgery with A SIP OF WATER:  esomeprazole (NEXIUM)  gabapentin (NEURONTIN) XTAMPZA ER   As of today, STOP taking any Aspirin (unless otherwise instructed by your surgeon) Aleve, Naproxen, Ibuprofen, Motrin, Advil, Goody's, BC's, all herbal medications, fish oil, and all vitamins.  WHAT DO I DO ABOUT MY DIABETES MEDICATION?   Do not take metFORMIN (GLUCOPHAGE) or linagliptin (TRADJENTA) the morning of surgery.  empagliflozin (JARDIANCE) Do NOT take Tuesday 03/08/21 or Wednesday 03/09/21  The day of surgery, do not take other diabetes injectables, including Trulicity (dulaglutide), Byetta (exenatide), Bydureon (exenatide ER), or Victoza (liraglutide).   HOW TO MANAGE YOUR DIABETES BEFORE AND AFTER SURGERY  Why is it important to control my blood sugar before and after surgery? Improving blood sugar levels before and after surgery helps healing and can limit problems. A way of improving blood sugar control is eating a healthy diet by:  Eating less sugar and carbohydrates  Increasing activity/exercise  Talking with your doctor about reaching your blood sugar goals High blood sugars (greater than 180 mg/dL) can raise your risk of infections and slow your recovery, so you will need to focus on controlling your diabetes during the weeks before surgery. Make sure that the doctor who takes care of your diabetes knows about your planned surgery including the date and location.  How do I manage my blood sugar  before surgery? Check your blood sugar at least 4 times a day, starting 2 days before surgery, to make sure that the level is not too high or low.  Check your blood sugar the morning of your surgery when you wake up and every 2 hours until you get to the Short Stay unit.  If your blood sugar is less than 70 mg/dL, you will need to treat for low blood sugar: Do not take insulin. Treat a low blood sugar (less than 70 mg/dL) with  cup of clear juice (cranberry or apple), 4 glucose tablets, OR glucose gel. Recheck blood sugar in 15 minutes after treatment (to make sure it is greater than 70 mg/dL). If your blood sugar is not greater than 70 mg/dL on recheck, call [phone] for further instructions. Report your blood sugar to the short stay nurse when you get to Short Stay.  If you are admitted to the hospital after surgery: Your blood sugar will be checked by the staff and you will probably be given insulin after surgery (instead of oral diabetes medicines) to make sure you have good blood sugar levels. The goal for blood sugar control after surgery is 80-180 mg/dL.           Do not wear jewelry or makeup Do not wear lotions, powders, perfumes/colognes, or deodorant. Do not shave 48 hours prior to surgery.  Men may shave face and neck. Do not bring valuables to the hospital. DO Not wear nail polish, gel polish, artificial nails, or any other type of covering on natural nails including finger and toenails. If patients have artificial nails,  gel coating, etc. that need to be removed by a nail salon please have this removed prior to surgery or surgery may need to be canceled/delayed if the surgeon/ anesthesia feels like the patient is unable to be adequately monitored.             [facility] is not responsible for any belongings or valuables.  Do NOT Smoke (Tobacco/Vaping)  24 hours prior to your procedure If you use a CPAP at night, you may bring your mask for your overnight stay.    Contacts, glasses, dentures or bridgework may not be worn into surgery, please bring cases for these belongings   For patients admitted to the hospital, discharge time will be determined by your treatment team.   Patients discharged the day of surgery will not be allowed to drive home, and someone needs to stay with them for 24 hours.  NO VISITORS WILL BE ALLOWED IN PRE-OP WHERE PATIENTS GET READY FOR SURGERY.  ONLY 1 SUPPORT PERSON MAY BE PRESENT WHILE YOU ARE IN SURGERY.  IF YOU ARE TO BE ADMITTED, ONCE YOU ARE IN YOUR ROOM YOU WILL BE ALLOWED TWO (2) VISITORS.  Minor children may have two parents present. Special consideration for safety and communication needs will be reviewed on a case by case basis.  Special instructions:    Oral Hygiene is also important to reduce your risk of infection.  Remember - BRUSH YOUR TEETH THE MORNING OF SURGERY WITH YOUR REGULAR TOOTHPASTE   [facility]- Preparing For Surgery  Before surgery, you can play an important role. Because skin is not sterile, your skin needs to be as free of germs as possible. You can reduce the number of germs on your skin by washing with CHG (chlorahexidine gluconate) Soap before surgery.  CHG is an antiseptic cleaner which kills germs and bonds with the skin to continue killing germs even after washing.     Please do not use if you have an allergy to CHG or antibacterial soaps. If your skin becomes reddened/irritated stop using the CHG.  Do not shave (including legs and underarms) for at least 48 hours prior to first CHG shower. It is OK to shave your face.  Please follow these instructions carefully.     Shower the NIGHT BEFORE SURGERY and the MORNING OF SURGERY with CHG Soap.   If you chose to wash your hair, wash your hair first as usual with your normal shampoo. After you shampoo, rinse your hair and body thoroughly to remove the shampoo.  Then [employer] and genitals (private parts) with your normal soap and rinse  thoroughly to remove soap.  After that Use CHG Soap as you would any other liquid soap. You can apply CHG directly to the skin and wash gently with a scrungie or a clean washcloth.   Apply the CHG Soap to your body ONLY FROM THE NECK DOWN.  Do not use on open wounds or open sores. Avoid contact with your eyes, ears, mouth and genitals (private parts). Wash Face and genitals (private parts)  with your normal soap.   Wash thoroughly, paying special attention to the area where your surgery will be performed.  Thoroughly rinse your body with warm water from the neck down.  DO NOT shower/wash with your normal soap after using and rinsing off the CHG Soap.  Pat yourself dry with a CLEAN TOWEL.  Wear CLEAN PAJAMAS to bed the night before surgery  Place CLEAN SHEETS on your bed the night  before your surgery  DO NOT SLEEP WITH PETS.   Day of Surgery:  Take a shower with CHG soap. Wear Clean/Comfortable clothing the morning of surgery Do not apply any deodorants/lotions.   Remember to brush your teeth WITH YOUR REGULAR TOOTHPASTE.   Please read over the following fact sheets that you were given.

## 2021-03-02 ENCOUNTER — Encounter (HOSPITAL_COMMUNITY)
Admission: RE | Admit: 2021-03-02 | Discharge: 2021-03-02 | Disposition: A | Discharge status: Home / Self Care | Payer: Medicare Other | Source: Ambulatory Visit | Attending: Neurosurgery | Admitting: Neurosurgery

## 2021-03-02 ENCOUNTER — Other Ambulatory Visit: Payer: Self-pay

## 2021-03-02 ENCOUNTER — Encounter (HOSPITAL_COMMUNITY): Payer: Self-pay

## 2021-03-02 DIAGNOSIS — Z01818 Encounter for other preprocedural examination: Secondary | ICD-10-CM | POA: Diagnosis not present

## 2021-03-02 LAB — CBC WITH DIFFERENTIAL/PLATELET
Abs Immature Granulocytes: 0.07 10*3/uL (ref 0.00–0.07)
Basophils Absolute: 0.1 10*3/uL (ref 0.0–0.1)
Basophils Relative: 1 %
Eosinophils Absolute: 0.2 10*3/uL (ref 0.0–0.5)
Eosinophils Relative: 2 %
HCT: 43.3 % (ref 39.0–52.0)
Hemoglobin: 14.4 g/dL (ref 13.0–17.0)
Immature Granulocytes: 1 %
Lymphocytes Relative: 17 %
Lymphs Abs: 1.5 10*3/uL (ref 0.7–4.0)
MCH: 28.1 pg (ref 26.0–34.0)
MCHC: 33.3 g/dL (ref 30.0–36.0)
MCV: 84.6 fL (ref 80.0–100.0)
Monocytes Absolute: 0.6 10*3/uL (ref 0.1–1.0)
Monocytes Relative: 7 %
Neutro Abs: 6.4 10*3/uL (ref 1.7–7.7)
Neutrophils Relative %: 72 %
Platelets: 193 10*3/uL (ref 150–400)
RBC: 5.12 MIL/uL (ref 4.22–5.81)
RDW: 13.4 % (ref 11.5–15.5)
WBC: 8.8 10*3/uL (ref 4.0–10.5)
nRBC: 0 % (ref 0.0–0.2)

## 2021-03-02 LAB — SURGICAL PCR SCREEN
MRSA, PCR: NEGATIVE
Staphylococcus aureus: POSITIVE — AB

## 2021-03-02 LAB — BASIC METABOLIC PANEL
Anion gap: 11 (ref 5–15)
BUN: 7 mg/dL — ABNORMAL LOW (ref 8–23)
CO2: 22 mmol/L (ref 22–32)
Calcium: 9.1 mg/dL (ref 8.9–10.3)
Chloride: 104 mmol/L (ref 98–111)
Creatinine, Ser: 0.77 mg/dL (ref 0.61–1.24)
GFR, Estimated: >60 mL/min (ref >60)
Glucose, Bld: 218 mg/dL — ABNORMAL HIGH (ref 70–99)
Potassium: 3.6 mmol/L (ref 3.5–5.1)
Sodium: 137 mmol/L (ref 135–145)

## 2021-03-02 LAB — GLUCOSE, CAPILLARY: Glucose-Capillary: 231 mg/dL — ABNORMAL HIGH (ref 70–99)

## 2021-03-02 NOTE — Progress Notes (Signed)
PCP - Thad Ranger, NP Cardiologist - denies  PPM/ICD - denies   Chest x-ray - 10/31/11 EKG - 12/26/18 Stress Test - 08/24/12 ECHO - denies Cardiac Cath - denies  Sleep Study - denies  DM: Type 2 Fasting Blood Sugar - 118 Checks Blood Sugar 3 times a day  Blood Thinner Instructions: n/a Aspirin Instructions: n/a  ERAS Protcol - NPO   COVID TEST- Testing appt scheduled 03/07/21   Anesthesia review: No  Patient denies shortness of breath, fever, cough and chest pain at PAT appointment   All instructions explained to the patient, with a verbal understanding of the material. Patient agrees to go over the instructions while at home for a better understanding. The opportunity to ask questions was provided.

## 2021-03-03 LAB — HEMOGLOBIN A1C
Hgb A1c MFr Bld: 8.2 % — ABNORMAL HIGH (ref 4.8–5.6)
Mean Plasma Glucose: 189 mg/dL

## 2021-03-06 DIAGNOSIS — G894 Chronic pain syndrome: Secondary | ICD-10-CM | POA: Diagnosis not present

## 2021-03-07 ENCOUNTER — Other Ambulatory Visit: Payer: Self-pay

## 2021-03-07 ENCOUNTER — Other Ambulatory Visit (HOSPITAL_COMMUNITY)
Admission: RE | Admit: 2021-03-07 | Discharge: 2021-03-07 | Disposition: A | Discharge status: Home / Self Care | Payer: Medicare Other | Source: Ambulatory Visit | Attending: Neurosurgery | Admitting: Neurosurgery

## 2021-03-07 DIAGNOSIS — R3912 Poor urinary stream: Secondary | ICD-10-CM | POA: Diagnosis not present

## 2021-03-07 DIAGNOSIS — Z01812 Encounter for preprocedural laboratory examination: Secondary | ICD-10-CM | POA: Insufficient documentation

## 2021-03-07 DIAGNOSIS — Z20822 Contact with and (suspected) exposure to covid-19: Secondary | ICD-10-CM | POA: Insufficient documentation

## 2021-03-07 LAB — SARS CORONAVIRUS 2 (TAT 6-24 HRS): SARS Coronavirus 2: NEGATIVE

## 2021-03-08 NOTE — Anesthesia Preprocedure Evaluation (Addendum)
Anesthesia Evaluation    Reviewed: Allergy & Precautions, Patient's Chart, lab work & pertinent test results  Airway Mallampati: I  TM Distance: >3 FB Neck ROM: Full    Dental no notable dental hx. (+) Edentulous Upper, Missing, Dental Advisory Given,    Pulmonary neg pulmonary ROS, former smoker,    Pulmonary exam normal breath sounds clear to auscultation       Cardiovascular hypertension, Pt. on medications Normal cardiovascular exam Rhythm:Regular Rate:Normal     Neuro/Psych  Headaches, negative psych ROS   GI/Hepatic Neg liver ROS, GERD  Medicated and Controlled,  Endo/Other  diabetes, Type 2, Oral Hypoglycemic Agents  Renal/GU negative Renal ROS  negative genitourinary   Musculoskeletal  (+) Arthritis ,   Abdominal   Peds  Hematology negative hematology ROS (+)   Anesthesia Other Findings   Reproductive/Obstetrics                            Anesthesia Physical Anesthesia Plan  ASA: 2  Anesthesia Plan: General   Post-op Pain Management:    Induction: Intravenous  PONV Risk Score and Plan: 2 and Midazolam, Dexamethasone and Ondansetron  Airway Management Planned: Oral ETT  Additional Equipment:   Intra-op Plan:   Post-operative Plan: Extubation in OR  Informed Consent: I have reviewed the patients History and Physical, chart, labs and discussed the procedure including the risks, benefits and alternatives for the proposed anesthesia with the patient or authorized representative who has indicated his/her understanding and acceptance.     Dental advisory given  Plan Discussed with: CRNA  Anesthesia Plan Comments:         Anesthesia Quick Evaluation

## 2021-03-09 ENCOUNTER — Encounter (HOSPITAL_COMMUNITY)
Admission: RE | Disposition: A | Discharge status: Home / Self Care | Payer: Self-pay | Source: Ambulatory Visit | Attending: Neurosurgery

## 2021-03-09 ENCOUNTER — Encounter (HOSPITAL_COMMUNITY): Payer: Self-pay | Admitting: Neurosurgery

## 2021-03-09 ENCOUNTER — Ambulatory Visit (HOSPITAL_COMMUNITY): Payer: Medicare Other | Admitting: Anesthesiology

## 2021-03-09 ENCOUNTER — Other Ambulatory Visit: Payer: Self-pay

## 2021-03-09 ENCOUNTER — Ambulatory Visit (HOSPITAL_COMMUNITY): Payer: Medicare Other

## 2021-03-09 ENCOUNTER — Observation Stay (HOSPITAL_COMMUNITY)
Admission: RE | Admit: 2021-03-09 | Discharge: 2021-03-10 | Disposition: A | Discharge status: Home / Self Care | Payer: Medicare Other | Source: Ambulatory Visit | Attending: Neurosurgery | Admitting: Neurosurgery

## 2021-03-09 DIAGNOSIS — E119 Type 2 diabetes mellitus without complications: Secondary | ICD-10-CM | POA: Diagnosis not present

## 2021-03-09 DIAGNOSIS — Z87891 Personal history of nicotine dependence: Secondary | ICD-10-CM | POA: Insufficient documentation

## 2021-03-09 DIAGNOSIS — Z79899 Other long term (current) drug therapy: Secondary | ICD-10-CM | POA: Diagnosis not present

## 2021-03-09 DIAGNOSIS — Z7984 Long term (current) use of oral hypoglycemic drugs: Secondary | ICD-10-CM | POA: Insufficient documentation

## 2021-03-09 DIAGNOSIS — Z419 Encounter for procedure for purposes other than remedying health state, unspecified: Secondary | ICD-10-CM

## 2021-03-09 DIAGNOSIS — M48062 Spinal stenosis, lumbar region with neurogenic claudication: Secondary | ICD-10-CM | POA: Diagnosis not present

## 2021-03-09 DIAGNOSIS — I10 Essential (primary) hypertension: Secondary | ICD-10-CM | POA: Diagnosis not present

## 2021-03-09 DIAGNOSIS — Z981 Arthrodesis status: Secondary | ICD-10-CM | POA: Diagnosis not present

## 2021-03-09 DIAGNOSIS — M5126 Other intervertebral disc displacement, lumbar region: Secondary | ICD-10-CM | POA: Diagnosis not present

## 2021-03-09 DIAGNOSIS — M4326 Fusion of spine, lumbar region: Secondary | ICD-10-CM | POA: Diagnosis not present

## 2021-03-09 HISTORY — PX: LUMBAR LAMINECTOMY/DECOMPRESSION MICRODISCECTOMY: SHX5026

## 2021-03-09 LAB — GLUCOSE, CAPILLARY
Glucose-Capillary: 168 mg/dL — ABNORMAL HIGH (ref 70–99)
Glucose-Capillary: 166 mg/dL — ABNORMAL HIGH (ref 70–99)
Glucose-Capillary: 199 mg/dL — ABNORMAL HIGH (ref 70–99)
Glucose-Capillary: 249 mg/dL — ABNORMAL HIGH (ref 70–99)
Glucose-Capillary: 209 mg/dL — ABNORMAL HIGH (ref 70–99)

## 2021-03-09 SURGERY — LUMBAR LAMINECTOMY/DECOMPRESSION MICRODISCECTOMY 1 LEVEL
Anesthesia: General | Site: Spine Lumbar | Laterality: Bilateral

## 2021-03-09 MED ORDER — SIMVASTATIN 5 MG PO TABS
10.0000 mg | ORAL_TABLET | Freq: Every day | ORAL | Status: DC
  Administered 2021-03-09: 10 mg via ORAL
  Filled 2021-03-09 (×2): qty 2

## 2021-03-09 MED ORDER — SODIUM CHLORIDE 0.9% FLUSH
3.0000 mL | Freq: Two times a day (BID) | INTRAVENOUS | Status: DC
  Administered 2021-03-09 (×2): 3 mL via INTRAVENOUS

## 2021-03-09 MED ORDER — SODIUM CHLORIDE (PF) 0.9 % IJ SOLN
INTRAMUSCULAR | Status: AC
  Filled 2021-03-09: qty 10

## 2021-03-09 MED ORDER — THROMBIN 5000 UNITS EX SOLR
CUTANEOUS | Status: DC | PRN
  Administered 2021-03-09 (×2): 5000 [IU] via TOPICAL

## 2021-03-09 MED ORDER — CYANOCOBALAMIN 500 MCG PO TABS
500.0000 ug | ORAL_TABLET | Freq: Every morning | ORAL | Status: DC
  Administered 2021-03-10: 500 ug via ORAL
  Filled 2021-03-09 (×3): qty 1

## 2021-03-09 MED ORDER — MUPIROCIN 2 % EX OINT: 1.0000 "application " | TOPICAL_OINTMENT | Freq: Once | CUTANEOUS | Status: AC

## 2021-03-09 MED ORDER — PHENYLEPHRINE HCL (PRESSORS) 10 MG/ML IV SOLN
INTRAVENOUS | Status: DC | PRN
  Administered 2021-03-09 (×2): 80 ug via INTRAVENOUS

## 2021-03-09 MED ORDER — DEXAMETHASONE SODIUM PHOSPHATE 10 MG/ML IJ SOLN
10.0000 mg | Freq: Once | INTRAMUSCULAR | Status: AC
  Administered 2021-03-09: 10 mg via INTRAVENOUS
  Filled 2021-03-09: qty 1

## 2021-03-09 MED ORDER — SUFENTANIL CITRATE 50 MCG/ML IV SOLN
INTRAVENOUS | Status: DC | PRN
  Administered 2021-03-09: 20 ug via INTRAVENOUS
  Administered 2021-03-09 (×3): 10 ug via INTRAVENOUS

## 2021-03-09 MED ORDER — ROCURONIUM BROMIDE 10 MG/ML (PF) SYRINGE
PREFILLED_SYRINGE | INTRAVENOUS | Status: AC
  Filled 2021-03-09: qty 10

## 2021-03-09 MED ORDER — VITAMIN D 25 MCG (1000 UNIT) PO TABS
2000.0000 [IU] | ORAL_TABLET | Freq: Every morning | ORAL | Status: DC
  Administered 2021-03-10: 2000 [IU] via ORAL
  Filled 2021-03-09: qty 2

## 2021-03-09 MED ORDER — PROPOFOL 10 MG/ML IV BOLUS
INTRAVENOUS | Status: AC
  Filled 2021-03-09: qty 20

## 2021-03-09 MED ORDER — PHENYLEPHRINE 40 MCG/ML (10ML) SYRINGE FOR IV PUSH (FOR BLOOD PRESSURE SUPPORT)
PREFILLED_SYRINGE | INTRAVENOUS | Status: AC
  Filled 2021-03-09: qty 10

## 2021-03-09 MED ORDER — SUFENTANIL CITRATE 50 MCG/ML IV SOLN
INTRAVENOUS | Status: AC
  Filled 2021-03-09: qty 1

## 2021-03-09 MED ORDER — GLYCOPYRROLATE PF 0.2 MG/ML IJ SOSY
PREFILLED_SYRINGE | INTRAMUSCULAR | Status: AC
  Filled 2021-03-09: qty 1

## 2021-03-09 MED ORDER — 0.9 % SODIUM CHLORIDE (POUR BTL) OPTIME
TOPICAL | Status: DC | PRN
  Administered 2021-03-09: 1000 mL

## 2021-03-09 MED ORDER — LIDOCAINE HCL (CARDIAC) PF 100 MG/5ML IV SOSY
PREFILLED_SYRINGE | INTRAVENOUS | Status: DC | PRN
  Administered 2021-03-09: 80 mg via INTRAVENOUS

## 2021-03-09 MED ORDER — HYDROCODONE-ACETAMINOPHEN 5-325 MG PO TABS: 1.0000 | ORAL_TABLET | ORAL | Status: DC | PRN

## 2021-03-09 MED ORDER — ACETAMINOPHEN 650 MG RE SUPP: 650.0000 mg | RECTAL | Status: DC | PRN

## 2021-03-09 MED ORDER — DEXAMETHASONE SODIUM PHOSPHATE 10 MG/ML IJ SOLN
INTRAMUSCULAR | Status: AC
  Filled 2021-03-09: qty 1

## 2021-03-09 MED ORDER — PHENYLEPHRINE HCL-NACL 20-0.9 MG/250ML-% IV SOLN
INTRAVENOUS | Status: DC | PRN
  Administered 2021-03-09: 50 ug/min via INTRAVENOUS

## 2021-03-09 MED ORDER — KETOROLAC TROMETHAMINE 15 MG/ML IJ SOLN
30.0000 mg | Freq: Four times a day (QID) | INTRAMUSCULAR | Status: AC
  Administered 2021-03-09 – 2021-03-10 (×4): 30 mg via INTRAVENOUS
  Filled 2021-03-09 (×4): qty 2

## 2021-03-09 MED ORDER — SODIUM CHLORIDE 0.9% FLUSH: 3.0000 mL | INTRAVENOUS | Status: DC | PRN

## 2021-03-09 MED ORDER — OXYCODONE HCL ER 10 MG PO T12A: 10.0000 mg | EXTENDED_RELEASE_TABLET | Freq: Two times a day (BID) | ORAL | Status: DC

## 2021-03-09 MED ORDER — LACTATED RINGERS IV SOLN: INTRAVENOUS | Status: DC | PRN

## 2021-03-09 MED ORDER — ONDANSETRON HCL 4 MG/2ML IJ SOLN
INTRAMUSCULAR | Status: DC | PRN
  Administered 2021-03-09: 4 mg via INTRAVENOUS

## 2021-03-09 MED ORDER — ONDANSETRON HCL 4 MG PO TABS
4.0000 mg | ORAL_TABLET | Freq: Four times a day (QID) | ORAL | Status: DC | PRN
Start: 2021-03-09 — End: 2021-03-10

## 2021-03-09 MED ORDER — HEMOSTATIC AGENTS (NO CHARGE) OPTIME
TOPICAL | Status: DC | PRN
  Administered 2021-03-09: 1 via TOPICAL

## 2021-03-09 MED ORDER — LACTATED RINGERS IV SOLN: INTRAVENOUS | Status: DC

## 2021-03-09 MED ORDER — CHLORHEXIDINE GLUCONATE CLOTH 2 % EX PADS: 6.0000 | MEDICATED_PAD | Freq: Once | CUTANEOUS | Status: DC

## 2021-03-09 MED ORDER — ACETAMINOPHEN 325 MG PO TABS: 650.0000 mg | ORAL_TABLET | ORAL | Status: DC | PRN

## 2021-03-09 MED ORDER — INSULIN ASPART 100 UNIT/ML IJ SOLN
0.0000 [IU] | Freq: Three times a day (TID) | INTRAMUSCULAR | Status: DC
  Administered 2021-03-09: 3 [IU] via SUBCUTANEOUS
  Administered 2021-03-09: 5 [IU] via SUBCUTANEOUS
  Administered 2021-03-10: 8 [IU] via SUBCUTANEOUS

## 2021-03-09 MED ORDER — KETOROLAC TROMETHAMINE 30 MG/ML IJ SOLN
INTRAMUSCULAR | Status: DC | PRN
  Administered 2021-03-09: 30 mg via INTRAVENOUS

## 2021-03-09 MED ORDER — PANTOPRAZOLE SODIUM 40 MG PO TBEC
40.0000 mg | DELAYED_RELEASE_TABLET | Freq: Every day | ORAL | Status: DC
  Administered 2021-03-10: 40 mg via ORAL
  Filled 2021-03-09: qty 1

## 2021-03-09 MED ORDER — ONDANSETRON HCL 4 MG/2ML IJ SOLN: 4.0000 mg | Freq: Four times a day (QID) | INTRAMUSCULAR | Status: DC | PRN

## 2021-03-09 MED ORDER — PROPOFOL 10 MG/ML IV BOLUS
INTRAVENOUS | Status: DC | PRN
  Administered 2021-03-09: 150 mg via INTRAVENOUS

## 2021-03-09 MED ORDER — ONDANSETRON HCL 4 MG/2ML IJ SOLN
INTRAMUSCULAR | Status: AC
  Filled 2021-03-09: qty 2

## 2021-03-09 MED ORDER — EMPAGLIFLOZIN 25 MG PO TABS
25.0000 mg | ORAL_TABLET | Freq: Every day | ORAL | Status: DC
  Administered 2021-03-09 – 2021-03-10 (×2): 25 mg via ORAL
  Filled 2021-03-09 (×2): qty 1

## 2021-03-09 MED ORDER — HYDROMORPHONE HCL 1 MG/ML IJ SOLN: 1.0000 mg | INTRAMUSCULAR | Status: DC | PRN

## 2021-03-09 MED ORDER — PHENOL 1.4 % MT LIQD: 1.0000 | OROMUCOSAL | Status: DC | PRN

## 2021-03-09 MED ORDER — SODIUM CHLORIDE 0.9 % IV SOLN
250.0000 mL | INTRAVENOUS | Status: DC
  Administered 2021-03-09: 250 mL via INTRAVENOUS

## 2021-03-09 MED ORDER — CHLORHEXIDINE GLUCONATE 0.12 % MT SOLN
15.0000 mL | Freq: Once | OROMUCOSAL | Status: AC
  Administered 2021-03-09: 15 mL via OROMUCOSAL
  Filled 2021-03-09: qty 15

## 2021-03-09 MED ORDER — THROMBIN 5000 UNITS EX SOLR
CUTANEOUS | Status: AC
  Filled 2021-03-09: qty 10000

## 2021-03-09 MED ORDER — ROCURONIUM 10MG/ML (10ML) SYRINGE FOR MEDFUSION PUMP - OPTIME
INTRAVENOUS | Status: DC | PRN
  Administered 2021-03-09: 80 mg via INTRAVENOUS

## 2021-03-09 MED ORDER — METFORMIN HCL 500 MG PO TABS
1000.0000 mg | ORAL_TABLET | Freq: Two times a day (BID) | ORAL | Status: DC
  Administered 2021-03-09 – 2021-03-10 (×2): 1000 mg via ORAL
  Filled 2021-03-09 (×2): qty 2

## 2021-03-09 MED ORDER — FENTANYL CITRATE (PF) 100 MCG/2ML IJ SOLN: 25.0000 ug | INTRAMUSCULAR | Status: DC | PRN

## 2021-03-09 MED ORDER — ORAL CARE MOUTH RINSE: 15.0000 mL | Freq: Once | OROMUCOSAL | Status: AC

## 2021-03-09 MED ORDER — CYCLOBENZAPRINE HCL 10 MG PO TABS: 10.0000 mg | ORAL_TABLET | Freq: Three times a day (TID) | ORAL | Status: DC | PRN

## 2021-03-09 MED ORDER — GLYCOPYRROLATE 0.2 MG/ML IJ SOLN
INTRAMUSCULAR | Status: DC | PRN
  Administered 2021-03-09: .2 mg via INTRAVENOUS

## 2021-03-09 MED ORDER — MUPIROCIN 2 % EX OINT
TOPICAL_OINTMENT | CUTANEOUS | Status: AC
  Administered 2021-03-09: 1 via TOPICAL
  Filled 2021-03-09: qty 22

## 2021-03-09 MED ORDER — BUPIVACAINE HCL (PF) 0.25 % IJ SOLN
INTRAMUSCULAR | Status: DC | PRN
  Administered 2021-03-09: 20 mL

## 2021-03-09 MED ORDER — DULAGLUTIDE 1.5 MG/0.5ML ~~LOC~~ SOAJ: 1.5000 mg | SUBCUTANEOUS | Status: DC

## 2021-03-09 MED ORDER — GABAPENTIN 300 MG PO CAPS
300.0000 mg | ORAL_CAPSULE | Freq: Three times a day (TID) | ORAL | Status: DC
  Administered 2021-03-09 – 2021-03-10 (×3): 300 mg via ORAL
  Filled 2021-03-09 (×3): qty 1

## 2021-03-09 MED ORDER — ACETAMINOPHEN 500 MG PO TABS
1000.0000 mg | ORAL_TABLET | Freq: Once | ORAL | Status: AC
  Administered 2021-03-09: 1000 mg via ORAL
  Filled 2021-03-09: qty 2

## 2021-03-09 MED ORDER — CEFAZOLIN SODIUM-DEXTROSE 1-4 GM/50ML-% IV SOLN
1.0000 g | Freq: Three times a day (TID) | INTRAVENOUS | Status: AC
  Administered 2021-03-09 (×2): 1 g via INTRAVENOUS
  Filled 2021-03-09 (×2): qty 50

## 2021-03-09 MED ORDER — MENTHOL 3 MG MT LOZG: 1.0000 | LOZENGE | OROMUCOSAL | Status: DC | PRN

## 2021-03-09 MED ORDER — MIDAZOLAM HCL 2 MG/2ML IJ SOLN
INTRAMUSCULAR | Status: AC
  Filled 2021-03-09: qty 2

## 2021-03-09 MED ORDER — SUGAMMADEX SODIUM 200 MG/2ML IV SOLN
INTRAVENOUS | Status: DC | PRN
  Administered 2021-03-09: 200 mg via INTRAVENOUS

## 2021-03-09 MED ORDER — LIDOCAINE HCL (PF) 2 % IJ SOLN
INTRAMUSCULAR | Status: AC
  Filled 2021-03-09: qty 5

## 2021-03-09 MED ORDER — LINAGLIPTIN 5 MG PO TABS
5.0000 mg | ORAL_TABLET | Freq: Every day | ORAL | Status: DC
  Administered 2021-03-10: 5 mg via ORAL
  Filled 2021-03-09 (×2): qty 1

## 2021-03-09 MED ORDER — MIDAZOLAM HCL 2 MG/2ML IJ SOLN
INTRAMUSCULAR | Status: DC | PRN
  Administered 2021-03-09: 2 mg via INTRAVENOUS

## 2021-03-09 MED ORDER — HYDROCODONE-ACETAMINOPHEN 10-325 MG PO TABS
2.0000 | ORAL_TABLET | ORAL | Status: DC | PRN
Start: 2021-03-09 — End: 2021-03-10
  Administered 2021-03-09 – 2021-03-10 (×6): 2 via ORAL
  Filled 2021-03-09 (×6): qty 2

## 2021-03-09 MED ORDER — BUPIVACAINE HCL (PF) 0.25 % IJ SOLN
INTRAMUSCULAR | Status: AC
  Filled 2021-03-09: qty 30

## 2021-03-09 MED ORDER — CEFAZOLIN SODIUM-DEXTROSE 2-4 GM/100ML-% IV SOLN
2.0000 g | INTRAVENOUS | Status: AC
  Administered 2021-03-09: 2 g via INTRAVENOUS
  Filled 2021-03-09: qty 100

## 2021-03-09 SURGICAL SUPPLY — 53 items
ADH SKN CLS APL DERMABOND .7 (GAUZE/BANDAGES/DRESSINGS) ×1
APL SKNCLS STERI-STRIP NONHPOA (GAUZE/BANDAGES/DRESSINGS) ×1
BAG COUNTER SPONGE SURGICOUNT (BAG) ×2 IMPLANT
BAG DECANTER FOR FLEXI CONT (MISCELLANEOUS) ×2 IMPLANT
BAG SPNG CNTER NS LX DISP (BAG) ×1
BAND INSRT 18 STRL LF DISP RB (MISCELLANEOUS) ×2
BAND RUBBER #18 3X1/16 STRL (MISCELLANEOUS) ×4 IMPLANT
BENZOIN TINCTURE PRP APPL 2/3 (GAUZE/BANDAGES/DRESSINGS) ×2 IMPLANT
BLADE CLIPPER SURG (BLADE) IMPLANT
BUR CUTTER 7.0 ROUND (BURR) ×2 IMPLANT
CANISTER SUCT 3000ML PPV (MISCELLANEOUS) ×2 IMPLANT
CARTRIDGE OIL MAESTRO DRILL (MISCELLANEOUS) ×1 IMPLANT
CLSR STERI-STRIP ANTIMIC 1/2X4 (GAUZE/BANDAGES/DRESSINGS) ×1 IMPLANT
DECANTER SPIKE VIAL GLASS SM (MISCELLANEOUS) ×1 IMPLANT
DERMABOND ADVANCED (GAUZE/BANDAGES/DRESSINGS) ×1
DERMABOND ADVANCED .7 DNX12 (GAUZE/BANDAGES/DRESSINGS) ×1 IMPLANT
DIFFUSER DRILL AIR PNEUMATIC (MISCELLANEOUS) ×2 IMPLANT
DRAPE HALF SHEET 40X57 (DRAPES) IMPLANT
DRAPE LAPAROTOMY 100X72X124 (DRAPES) ×2 IMPLANT
DRAPE MICROSCOPE LEICA (MISCELLANEOUS) ×1 IMPLANT
DRAPE SURG 17X23 STRL (DRAPES) ×4 IMPLANT
DRSG OPSITE POSTOP 3X4 (GAUZE/BANDAGES/DRESSINGS) ×1 IMPLANT
DURAPREP 26ML APPLICATOR (WOUND CARE) ×2 IMPLANT
ELECT REM PT RETURN 9FT ADLT (ELECTROSURGICAL) ×2
ELECTRODE REM PT RTRN 9FT ADLT (ELECTROSURGICAL) ×1 IMPLANT
GAUZE 4X4 16PLY ~~LOC~~+RFID DBL (SPONGE) IMPLANT
GAUZE SPONGE 4X4 12PLY STRL (GAUZE/BANDAGES/DRESSINGS) ×2 IMPLANT
GLOVE EXAM NITRILE XL STR (GLOVE) IMPLANT
GLOVE SURG ENC MOIS LTX SZ6.5 (GLOVE) ×2 IMPLANT
GLOVE SURG LTX SZ9 (GLOVE) ×2 IMPLANT
GLOVE SURG UNDER POLY LF SZ6.5 (GLOVE) ×2 IMPLANT
GOWN STRL REUS W/ TWL LRG LVL3 (GOWN DISPOSABLE) IMPLANT
GOWN STRL REUS W/ TWL XL LVL3 (GOWN DISPOSABLE) ×1 IMPLANT
GOWN STRL REUS W/TWL 2XL LVL3 (GOWN DISPOSABLE) ×1 IMPLANT
GOWN STRL REUS W/TWL LRG LVL3 (GOWN DISPOSABLE) ×2
GOWN STRL REUS W/TWL XL LVL3 (GOWN DISPOSABLE) ×2
KIT BASIN OR (CUSTOM PROCEDURE TRAY) ×2 IMPLANT
KIT TURNOVER KIT B (KITS) ×2 IMPLANT
NDL SPNL 22GX3.5 QUINCKE BK (NEEDLE) IMPLANT
NEEDLE HYPO 22GX1.5 SAFETY (NEEDLE) ×2 IMPLANT
NEEDLE SPNL 22GX3.5 QUINCKE BK (NEEDLE) ×2 IMPLANT
NS IRRIG 1000ML POUR BTL (IV SOLUTION) ×2 IMPLANT
OIL CARTRIDGE MAESTRO DRILL (MISCELLANEOUS) ×2
PACK LAMINECTOMY NEURO (CUSTOM PROCEDURE TRAY) ×2 IMPLANT
PAD ARMBOARD 7.5X6 YLW CONV (MISCELLANEOUS) ×6 IMPLANT
PENCIL BUTTON HOLSTER BLD 10FT (ELECTRODE) ×1 IMPLANT
SPONGE SURGIFOAM ABS GEL SZ50 (HEMOSTASIS) ×2 IMPLANT
STRIP CLOSURE SKIN 1/2X4 (GAUZE/BANDAGES/DRESSINGS) ×2 IMPLANT
SUT VIC AB 2-0 CT1 18 (SUTURE) ×2 IMPLANT
SUT VIC AB 3-0 SH 8-18 (SUTURE) ×2 IMPLANT
TOWEL GREEN STERILE (TOWEL DISPOSABLE) ×2 IMPLANT
TOWEL GREEN STERILE FF (TOWEL DISPOSABLE) ×2 IMPLANT
WATER STERILE IRR 1000ML POUR (IV SOLUTION) ×2 IMPLANT

## 2021-03-09 NOTE — Brief Op Note (Signed)
03/09/2021  9:38 AM  PATIENT:  Riley Clarke  65 y.o. male  PRE-OPERATIVE DIAGNOSIS:  Stenosis lumabar  POST-OPERATIVE DIAGNOSIS:  Stenosis lumabar  PROCEDURE:  Procedure(s): Laminectomy and Foraminotomy - bilateral - Lumbar two-Lumbar three (Bilateral)  SURGEON:  Surgeon(s) and Role:    Julio Sicks, MD - Primary  PHYSICIAN ASSISTANT:   ASSISTANTSMarland Mcalpine   ANESTHESIA:   general  EBL:  100 mL   BLOOD ADMINISTERED:none  DRAINS: none   LOCAL MEDICATIONS USED:  MARCAINE     SPECIMEN:  No Specimen  DISPOSITION OF SPECIMEN:  N/A  COUNTS:  YES  TOURNIQUET:  * No tourniquets in log *  DICTATION: .Dragon Dictation  PLAN OF CARE: Admit for overnight observation  PATIENT DISPOSITION:  PACU - hemodynamically stable.   Delay start of Pharmacological VTE agent (>24hrs) due to surgical blood loss or risk of bleeding: yes

## 2021-03-09 NOTE — Anesthesia Postprocedure Evaluation (Signed)
Anesthesia Post Note  Patient: [name]  Procedure(s) Performed: Laminectomy and Foraminotomy - bilateral - Lumbar two-Lumbar three (Bilateral: Spine Lumbar)     Patient location during evaluation: PACU Anesthesia Type: General Level of consciousness: awake and alert Pain management: pain level controlled Vital Signs Assessment: post-procedure vital signs reviewed and stable Respiratory status: spontaneous breathing, nonlabored ventilation, respiratory function stable and patient connected to nasal cannula oxygen Cardiovascular status: blood pressure returned to baseline and stable Postop Assessment: no apparent nausea or vomiting Anesthetic complications: no   No notable events documented.  Last Vitals:  Vitals:   03/09/21 1040 03/09/21 1114  BP: (!) 150/90 (!) 144/86  Pulse: 89 64  Resp: 17 20  Temp: 36.6 C 36.6 C  SpO2: 94% 100%    Last Pain:  Vitals:   03/09/21 1114  TempSrc: Oral  PainSc:                  Zuleica Seith L Mckaylah Bettendorf

## 2021-03-09 NOTE — Anesthesia Procedure Notes (Signed)
Procedure Name: Intubation Date/Time: 03/09/2021 8:07 AM Performed by: Claris Che, CRNA Pre-anesthesia Checklist: Patient identified, Emergency Drugs available, Suction available, Patient being monitored and Timeout performed Patient Re-evaluated:Patient Re-evaluated prior to induction Oxygen Delivery Method: Circle system utilized Preoxygenation: Pre-oxygenation with 100% oxygen Induction Type: IV induction and Cricoid Pressure applied Ventilation: Mask ventilation without difficulty Laryngoscope Size: Mac and 4 Grade View: Grade II Tube type: Oral Tube size: 8.0 mm Number of attempts: 1 Placement Confirmation: ETT inserted through vocal cords under direct vision, positive ETCO2 and breath sounds checked- equal and bilateral Secured at: 23 cm Tube secured with: Tape Dental Injury: Teeth and Oropharynx as per pre-operative assessment

## 2021-03-09 NOTE — Transfer of Care (Signed)
Immediate Anesthesia Transfer of Care Note  Patient: Riley Clarke  Procedure(s) Performed: Laminectomy and Foraminotomy - bilateral - Lumbar two-Lumbar three (Bilateral: Spine Lumbar)  Patient Location: PACU  Anesthesia Type:General  Level of Consciousness: drowsy, patient cooperative and responds to stimulation  Airway & Oxygen Therapy: Patient Spontanous Breathing and Patient connected to nasal cannula oxygen  Post-op Assessment: Report given to RN, Post -op Vital signs reviewed and stable and Patient moving all extremities X 4  Post vital signs: Reviewed and stable  Last Vitals:  Vitals Value Taken Time  BP 158/86 03/09/21 0953  Temp    Pulse 100 03/09/21 0954  Resp 17 03/09/21 0953  SpO2 97 % 03/09/21 0954  Vitals shown include unvalidated device data.  Last Pain:  Vitals:   03/09/21 0652  TempSrc:   PainSc: 8       Patients Stated Pain Goal: 6 (03/09/21 4580)  Complications: No notable events documented.

## 2021-03-09 NOTE — H&P (Signed)
Riley Clarke is an 65 y.o. male.   Chief Complaint: Back pain HPI: 65 year old male status post prior L4-S1 decompression and fusion presents with increasing back and bilateral pain numbness and some weakness.  Work-up demonstrates evidence of progressive multi factorial spinal stenosis with severe thecal sac compression at the L2-3 level.  Patient presents now for decompressive surgery at L2-3.  Past Medical History:  Diagnosis Date   Arthritis    Chronic back pain    on chronic opioids   Diabetes mellitus without complication (HCC)    GERD (gastroesophageal reflux disease)    Headache(784.0)    HLD (hyperlipidemia)    Hypertension    no meds   Vitamin D deficiency     Past Surgical History:  Procedure Laterality Date   CERVICAL DISC SURGERY  99   LUMBAR FUSION     ROTATOR CUFF REPAIR Bilateral    SHOULDER ARTHROSCOPY WITH ROTATOR CUFF REPAIR AND SUBACROMIAL DECOMPRESSION Right 12/30/2018   Procedure: SHOULDER ARTHROSCOPY WITH ROTATOR CUFF REPAIR AND SUBACROMIAL DECOMPRESSION, DISTAL CLAVICLE EXICISION,;  Surgeon: Bjorn Pippin, MD;  Location:  SURGERY CENTER;  Service: Orthopedics;  Laterality: Right;   SHOULDER ARTHROSCOPY WITH SUBACROMIAL DECOMPRESSION, ROTATOR CUFF REPAIR AND BICEP TENDON REPAIR Left 06/19/2019   Procedure: LEFT SHOULDER ARTHROSCOPY, EXTENTSIVE DEBRIDEMENT, DISTAL CLAVICULECTOMY, SUBACROMIAL DECOMPRESSION, PARTIAL ACROMIOPLASTY WITH CORACOACROMIAL RELEASE, ROTATOR CUFFF REPAIR AND BICEP TENODESIS;  Surgeon: Bjorn Pippin, MD;  Location:  SURGERY CENTER;  Service: Orthopedics;  Laterality: Left;    Family History  Problem Relation Age of Onset   Heart disease Mother    Diabetes Mother    Amblyopia Neg Hx    Blindness Neg Hx    Cataracts Neg Hx    Hypertension Neg Hx    Macular degeneration Neg Hx    Retinal detachment Neg Hx    Strabismus Neg Hx    Esophageal cancer Neg Hx    Colon cancer Neg Hx    Pancreatic cancer Neg Hx     Ulcerative colitis Neg Hx    Inflammatory bowel disease Neg Hx    Liver disease Neg Hx    Rectal cancer Neg Hx    Stomach cancer Neg Hx    Social History:  reports that he quit smoking about 10 months ago. His smoking use included cigarettes. He has a 6.25 pack-year smoking history. He has never used smokeless tobacco. He reports that he does not currently use alcohol after a past usage of about 4.0 standard drinks per week. He reports that he does not use drugs.  Allergies:  Allergies  Allergen Reactions   Cymbalta [Duloxetine Hcl] Nausea And Vomiting and Other (See Comments)    Dizziness/headaches   Shrimp [Shellfish Allergy]     intolerant   Valium [Diazepam] Itching    Medications Prior to Admission  Medication Sig Dispense Refill   Cholecalciferol (QC VITAMIN D3) 50 MCG (2000 UT) TABS Take 2,000 Units by mouth in the morning.     empagliflozin (JARDIANCE) 25 MG TABS tablet Take 1 tablet (25 mg total) by mouth daily. 90 tablet 3   esomeprazole (NEXIUM) 40 MG capsule TAKE ONE (1) CAPSULE BY MOUTH EVERY MORNING BEFORE BREAKFAST 90 capsule 1   gabapentin (NEURONTIN) 300 MG capsule Take 1 capsule (300 mg total) by mouth 3 (three) times daily. 270 capsule 3   linagliptin (TRADJENTA) 5 MG TABS tablet Take 1 tablet (5 mg total) by mouth daily. 90 tablet 3   metFORMIN (GLUCOPHAGE) 1000 MG tablet  Take 1 tablet (1,000 mg total) by mouth 2 (two) times daily with a meal. TAKE 1 TABLET BY MOUTH 2 TIMES DAILY WITH A MEAL. 180 tablet 3   simvastatin (ZOCOR) 10 MG tablet Take 1 tablet (10 mg total) by mouth at bedtime. (Patient not taking: Reported on 02/24/2021) 90 tablet 3   TRULICITY 1.5 MG/0.5ML SOPN Inject 1.5 mg into the skin every Monday.     vitamin B-12 (CYANOCOBALAMIN) 500 MCG tablet Take 500 mcg by mouth in the morning.     XTAMPZA ER 9 MG C12A Take 9 mg by mouth every 12 (twelve) hours.     finasteride (PROSCAR) 5 MG tablet Take 1 tablet (5 mg total) by mouth daily. (Patient not taking:  No sig reported) 90 tablet 3   glucose blood (ACCU-CHEK AVIVA) test strip Use as instructed 100 each 12   lisinopril-hydrochlorothiazide (ZESTORETIC) 20-25 MG tablet Take 1 tablet by mouth daily. (Patient not taking: No sig reported) 90 tablet 3   tamsulosin (FLOMAX) 0.4 MG CAPS capsule Take 1 capsule (0.4 mg total) by mouth daily. (Patient not taking: No sig reported) 30 capsule 11    Results for orders placed or performed during the hospital encounter of 03/07/21 (from the past 48 hour(s))  SARS CORONAVIRUS 2 (TAT 6-24 HRS) Nasopharyngeal Nasopharyngeal Swab     Status: None   Collection Time: 03/07/21 10:21 AM   Specimen: Nasopharyngeal Swab  Result Value Ref Range   SARS Coronavirus 2 NEGATIVE NEGATIVE    Comment: (NOTE) SARS-CoV-2 target nucleic acids are NOT DETECTED.  The SARS-CoV-2 RNA is generally detectable in upper and lower respiratory specimens during the acute phase of infection. Negative results do not preclude SARS-CoV-2 infection, do not rule out co-infections with other pathogens, and should not be used as the sole basis for treatment or other patient management decisions. Negative results must be combined with clinical observations, patient history, and epidemiological information. The expected result is Negative.  Fact Sheet for Patients: HairSlick.no  Fact Sheet for Healthcare Providers: quierodirigir.com  This test is not yet approved or cleared by the [place] FDA and  has been authorized for detection and/or diagnosis of SARS-CoV-2 by FDA under an Emergency Use Authorization (EUA). This EUA will remain  in effect (meaning this test can be used) for the duration of the COVID-19 declaration under Se ction 564(b)(1) of the Act, 21 U.S.C. section 360bbb-3(b)(1), unless the authorization is terminated or revoked sooner.  Performed at [facility], [address], [place], [place] [address]     No results found.  Pertinent items noted in HPI and remainder of comprehensive ROS otherwise negative.  Blood pressure (!) 161/74, pulse 76, temperature 98.3 F (36.8 C), temperature source Oral, resp. rate 18, height 6\' 1"  (1.854 m), weight 105.2 kg, SpO2 97 %.  Patient is awake and alert.  He is oriented and appropriate.  Speech is fluent.  Judgment insight are intact.  Cranial nerve function normal bilateral.  Motor examination 5/5 bilateral upper extremities.  Motor examination is lower extremities reveal some mild weakness of hip flexion and quadriceps muscle groups bilaterally.  Otherwise motor strength intact.  Sensory examination with decrease sensation pinprick light touch in his L3 dermatomes bilaterally.  Deep interfaces are normal active in both upper extremities hypoactive in both lower extremities but symmetric by bilaterally.  Gait is antalgic.  Posture is moderately flexed peer examination head ears eyes nose and throat is unremarked.  Chest and abdomen are benign.  Extremities  are free of major deformity. 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  Plan bilateral L2-3 decompressive laminotomies and foraminotomies and hopeful relief of his symptoms.  Risks and benefits of been explained.  Patient wishes to proceed.  Kathaleen Maser Tricia Pledger 03/09/2021, 7:54 AM

## 2021-03-09 NOTE — Op Note (Signed)
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  Asst. Surgeon: Doran Durand, NP  Anesthesia: General  Indication: 65 year old male with history of prior L4-S1 decompression and fusion with good results presents with worsening bilateral lower extremity numbness paresthesias and weakness worsened with ambulation.  Work-up demonstrates evidence of marked stenosis at L2-3 without evidence of instability.  There is some degree of disc herniation also on that as well on the left at L2-3.  Patient presents now for decompressive surgery in hopes of improving his symptoms.  Operative note: After induction of anesthesia, patient position prone on the Wilson frame properly padded.  Lumbar region prepped draped sterilely.  Incision made overlying L2-3.  Dissection performed.  Retractors placed.  X-rays taken in level confirmed.  Decompressive laminotomy was then performed bilaterally using high-speed drill and Kerrison rongeurs to remove the inferior aspect of the lamina of L2 the medial aspect the L2-3 facet joint and the superior rim of the L3 lamina.  Ligament flavum elevated and resected.  Underlying thecal sac was identified.  Wide decompressive foraminotomies were performed along the course exiting L2 and L3 nerve roots bilaterally.  Microscope brought Fields micro section spinal canal.  Thecal sac and left L3 nerve root gently mobilized and tracked towards midline.  Disc space and disc herniation identified.  Disc space was dissected free.  A subligamentous disc fragment was dissected free and removed.  The disc space was entered and all loose or obviously degenerative disc tear was moved in the interspace.  The wound was then irrigated with  antibiotic solution.  Gelfoam was placed topically for hemostasis.  Wounds and closed in layers with Vicryl sutures.  Steri-Strips and sterile dressing were applied.  No apparent complications.  Patient tolerated the procedure well and he returns to the recovery room postop.

## 2021-03-10 ENCOUNTER — Encounter (HOSPITAL_COMMUNITY): Payer: Self-pay | Admitting: Neurosurgery

## 2021-03-10 DIAGNOSIS — E119 Type 2 diabetes mellitus without complications: Secondary | ICD-10-CM | POA: Diagnosis not present

## 2021-03-10 DIAGNOSIS — Z87891 Personal history of nicotine dependence: Secondary | ICD-10-CM | POA: Diagnosis not present

## 2021-03-10 DIAGNOSIS — I10 Essential (primary) hypertension: Secondary | ICD-10-CM | POA: Diagnosis not present

## 2021-03-10 DIAGNOSIS — Z79899 Other long term (current) drug therapy: Secondary | ICD-10-CM | POA: Diagnosis not present

## 2021-03-10 DIAGNOSIS — M48062 Spinal stenosis, lumbar region with neurogenic claudication: Secondary | ICD-10-CM | POA: Diagnosis not present

## 2021-03-10 DIAGNOSIS — Z7984 Long term (current) use of oral hypoglycemic drugs: Secondary | ICD-10-CM | POA: Diagnosis not present

## 2021-03-10 LAB — GLUCOSE, CAPILLARY: Glucose-Capillary: 172 mg/dL — ABNORMAL HIGH (ref 70–99)

## 2021-03-10 MED ORDER — HYDROCODONE-ACETAMINOPHEN 10-325 MG PO TABS: 1.0000 | ORAL_TABLET | ORAL | 0 refills | Status: DC | PRN

## 2021-03-10 MED ORDER — CYCLOBENZAPRINE HCL 10 MG PO TABS: 10.0000 mg | ORAL_TABLET | Freq: Three times a day (TID) | ORAL | 0 refills | Status: DC | PRN

## 2021-03-10 NOTE — Discharge Instructions (Signed)
Wound Care Keep incision covered and dry for two days.    Do not put any creams, lotions, or ointments on incision. Leave steri-strips on back.  They will fall off by themselves.  Activity Walk each and every day, increasing distance each day. No lifting greater than 5 lbs. No driving for 2 weeks; may ride as a passenger locally.  Diet Resume your normal diet.   Return to Work Will be discussed at your follow up appointment.  Call Your Doctor If Any of These Occur Redness, drainage, or swelling at the wound.  Temperature greater than 101 degrees. Severe pain not relieved by pain medication. Incision starts to come apart.  Follow Up Appt Call today for appointment in 1-2 weeks ([phone]) or for problems.   

## 2021-03-10 NOTE — Progress Notes (Signed)
PT Cancellation Note and Discharge  Patient Details Name: Riley Clarke MRN: [id number] DOB: 1956/04/01   Cancelled Treatment:    Reason Eval/Treat Not Completed: PT screened, no needs identified, will sign off.  Discussed pt case with OT who reports pt is independent with mobility. Stopped in and spoke with pt who reports he has no further questions, feels comfortable with his precautions, and does not have to negotiate stairs at home. If needs change, please reconsult.    Marylynn Pearson 03/10/2021, 8:47 AM  Conni Slipper, PT, DPT Acute Rehabilitation Services Pager: [phone] Office: [phone]

## 2021-03-10 NOTE — Discharge Summary (Signed)
Physician Discharge Summary     Providing Compassionate, Quality Care - Together   Patient ID: Riley Clarke MRN: [id number] DOB/AGE: 12-07-1955 65 y.o.  Admit date: 03/09/2021 Discharge date: 03/10/2021  Admission Diagnoses: Lumbar stenosis with neurogenic claudication  Discharge Diagnoses:  Active Problems:   Lumbar stenosis with neurogenic claudication   Discharged Condition: good  Hospital Course: Patient underwent bilateral L2-3 decompressive laminotomies with foraminotomies and a left L2-3 microdiscectomy  by Dr. Jordan Likes on 03/09/2021. He was admitted to 3C06 following recovery from anesthesia in the PACU. His postoperative course has been uncomplicated. He is ambulating independently and without difficulty. He is tolerating a normal diet. He is not having any bowel or bladder dysfunction. His pain is well-controlled with oral pain medication. He is ready for discharge home.   Consults: None  Significant Diagnostic Studies: radiology: DG Lumbar Spine 1 View  Result Date: 03/09/2021 CLINICAL DATA:  Localization for laminectomy and foraminotomy L2-3 EXAM: LUMBAR SPINE - 1 VIEW COMPARISON:  02/03/2021 FINDINGS: Remote posterior fusion changes from L4-S1. 1st lateral intraoperative image demonstrates posterior instrument directed at the L2 assess. Second lateral intraoperative image demonstrates posterior localizing instrument directed at the L2-3 disc space. IMPRESSION: Intraoperative localization as above. Electronically Signed   By: Charlett Nose M.D.   On: 03/09/2021 13:41     Treatments: surgery: Bilateral L2-3 decompressive laminotomies with foraminotomies, left L2-3 microdiscectomy  Discharge Exam: Blood pressure (!) 143/80, pulse 70, temperature 98.5 F (36.9 C), temperature source Oral, resp. rate 18, height 6\' 1"  (1.854 m), weight 105.2 kg, SpO2 99 %.  Alert and oriented x 4 PERRLA CN II-XII grossly intact MAE, Strength and sensation intact Incision is covered with  Honeycomb dressing and Steri Strips; Dressing is clean, dry, and intact   Disposition: Discharge disposition: 01-Home or Self Care        Allergies as of 03/10/2021       Reactions   Cymbalta [duloxetine Hcl] Nausea And Vomiting, Other (See Comments)   Dizziness/headaches   Shrimp [shellfish Allergy]    intolerant   Valium [diazepam] Itching        Medication List     STOP taking these medications    finasteride 5 MG tablet Commonly known as: Proscar   lisinopril-hydrochlorothiazide 20-25 MG tablet Commonly known as: ZESTORETIC   simvastatin 10 MG tablet Commonly known as: ZOCOR   tamsulosin 0.4 MG Caps capsule Commonly known as: FLOMAX       TAKE these medications    cyclobenzaprine 10 MG tablet Commonly known as: FLEXERIL Take 1 tablet (10 mg total) by mouth 3 (three) times daily as needed for muscle spasms.   empagliflozin 25 MG Tabs tablet Commonly known as: Jardiance Take 1 tablet (25 mg total) by mouth daily.   esomeprazole 40 MG capsule Commonly known as: NEXIUM TAKE ONE (1) CAPSULE BY MOUTH EVERY MORNING BEFORE BREAKFAST   gabapentin 300 MG capsule Commonly known as: NEURONTIN Take 1 capsule (300 mg total) by mouth 3 (three) times daily.   glucose blood test strip Commonly known as: Accu-Chek Aviva Use as instructed   HYDROcodone-acetaminophen 10-325 MG tablet Commonly known as: NORCO Take 1 tablet by mouth every 4 (four) hours as needed for severe pain ((score 7 to 10)).   linagliptin 5 MG Tabs tablet Commonly known as: Tradjenta Take 1 tablet (5 mg total) by mouth daily.   metFORMIN 1000 MG tablet Commonly known as: GLUCOPHAGE Take 1 tablet (1,000 mg total) by mouth 2 (two) times daily with a  meal. TAKE 1 TABLET BY MOUTH 2 TIMES DAILY WITH A MEAL.   QC Vitamin D3 50 MCG (2000 UT) Tabs Generic drug: Cholecalciferol Take 2,000 Units by mouth in the morning.   Trulicity 1.5 MG/0.5ML Sopn Generic drug: Dulaglutide Inject 1.5 mg  into the skin every Monday.   vitamin B-12 500 MCG tablet Commonly known as: CYANOCOBALAMIN Take 500 mcg by mouth in the morning.   Xtampza ER 9 MG C12a Generic drug: oxyCODONE ER Take 9 mg by mouth every 12 (twelve) hours.        Follow-up Information     Julio Sicks, MD Follow up in 10 day(s).   Specialty: Neurosurgery Contact information: [address] [place] [address] [phone]                 Signed: Val Eagle, DNP, AGNP-C Nurse Practitioner  [facility] [address], [address], [place], [place] [address] P: [phone]    F: [phone]  03/10/2021, 10:04 AM

## 2021-03-10 NOTE — Evaluation (Signed)
Occupational Therapy Evaluation/Discharge Patient Details Name: Riley Clarke MRN: [id number] DOB: 10/16/1955 Today's Date: 03/10/2021   History of Present Illness Pt is a 65 y/o male who presented with progressive back pain and B LE numbness, weakness. Imaging shows progressive multi factorial spinal stenosis at L2-3 level. Pt elected to undergo decompressive surgery at L2-3. PMH: hx of L4-S1 fusion, DM, GERD, HLD, HTN, rotator cuff repair.   Clinical Impression   PTA, pt lives alone in first floor apartment and reports Independence in all daily tasks without AD. Pt presents now at baseline and able to complete ADLs/mobility while implementing back precautions well. Educated on strategies for IADLs with pt reporting his sister can assist as needed. Pt has been ambulating in hallway Independently. No safety concerns noted and no further skilled OT services needed at this time. OT to sign off.       Recommendations for follow up therapy are one component of a multi-disciplinary discharge planning process, led by the attending physician.  Recommendations may be updated based on patient status, additional functional criteria and insurance authorization.   Follow Up Recommendations  No OT follow up    Equipment Recommendations  None recommended by OT    Recommendations for Other Services       Precautions / Restrictions Precautions Precautions: Back Precaution Booklet Issued: Yes (comment) Precaution Comments: no brace needed Restrictions Weight Bearing Restrictions: No      Mobility Bed Mobility               General bed mobility comments: received standing in room    Transfers Overall transfer level: Independent Equipment used: None                  Balance Overall balance assessment: Independent                                         ADL either performed or assessed with clinical judgement   ADL Overall ADL's : Independent                                        General ADL Comments: able to complete LB, UB dressing, ambulate in room without AD and without safety concern. Educated on strategies for IADLs     Vision Baseline Vision/History: 0 No visual deficits Ability to See in Adequate Light: 0 Adequate Patient Visual Report: No change from baseline Vision Assessment?: No apparent visual deficits     Perception     Praxis      Pertinent Vitals/Pain Pain Assessment: No/denies pain     Hand Dominance Right   Extremity/Trunk Assessment Upper Extremity Assessment Upper Extremity Assessment: Overall WFL for tasks assessed   Lower Extremity Assessment Lower Extremity Assessment: Defer to PT evaluation   Cervical / Trunk Assessment Cervical / Trunk Assessment: Normal   Communication Communication Communication: No difficulties   Cognition Arousal/Alertness: Awake/alert Behavior During Therapy: WFL for tasks assessed/performed Overall Cognitive Status: Within Functional Limits for tasks assessed                                     General Comments       Exercises     Shoulder Instructions  Home Living Family/patient expects to be discharged to:: Private residence Living Arrangements: Alone Available Help at Discharge: Family;Available PRN/intermittently Type of Home: Apartment Home Access: Level entry     Home Layout: One level     Bathroom Shower/Tub: Chief Strategy Officer: Handicapped height     Home Equipment: Shower seat;Hand held shower head;Grab bars - tub/shower;Grab bars - toilet;Toilet riser;Adaptive equipment Adaptive Equipment: Reacher;Long-handled shoe horn        Prior Functioning/Environment Level of Independence: Independent        Comments: Driving, grocery shopping, no use of AD        OT Problem List:        OT Treatment/Interventions:      OT Goals(Current goals can be found in the care plan section) Acute  Rehab OT Goals Patient Stated Goal: go home OT Goal Formulation: All assessment and education complete, DC therapy  OT Frequency:     Barriers to D/C:            Co-evaluation              AM-PAC OT "6 Clicks" Daily Activity     Outcome Measure Help from another person eating meals?: None Help from another person taking care of personal grooming?: None Help from another person toileting, which includes using toliet, bedpan, or urinal?: None Help from another person bathing (including washing, rinsing, drying)?: None Help from another person to put on and taking off regular upper body clothing?: None Help from another person to put on and taking off regular lower body clothing?: None 6 Click Score: 24   End of Session Nurse Communication: Mobility status  Activity Tolerance: Patient tolerated treatment well Patient left: in bed;with call bell/phone within reach  OT Visit Diagnosis: Pain Pain - part of body:  (back)                Time: 5009-3818 OT Time Calculation (min): 10 min Charges:  OT General Charges $OT Visit: 1 Visit OT Evaluation $OT Eval Low Complexity: 1 Low  Bradd Canary, OTR/L Acute Rehab Services Office: [phone]   Lorre Munroe 03/10/2021, 7:49 AM

## 2021-03-10 NOTE — Plan of Care (Signed)
  Problem: Education: Goal: Ability to verbalize activity precautions or restrictions will improve Outcome: Completed/Met Goal: Knowledge of the prescribed therapeutic regimen will improve Outcome: Completed/Met Goal: Understanding of discharge needs will improve Outcome: Completed/Met   Problem: Bowel/Gastric: Goal: Gastrointestinal status for postoperative course will improve Outcome: Completed/Met   Problem: Clinical Measurements: Goal: Ability to maintain clinical measurements within normal limits will improve Outcome: Completed/Met Goal: Postoperative complications will be avoided or minimized Outcome: Completed/Met Goal: Diagnostic test results will improve Outcome: Completed/Met   Problem: Health Behavior/Discharge Planning: Goal: Identification of resources available to assist in meeting health care needs will improve Outcome: Completed/Met

## 2021-03-10 NOTE — Progress Notes (Signed)
Patient was transported via wheelchair by volunteer for discharge home; in no acute distress nor complaints of pain nor discomfort;  honeycomb dressing on his back was clean, dry and intact; room was checked and accounted for all his belongings; discharge instructions was discussed with patient by RN regarding his medications, follow up appointment, wound care and when to call the doctor as needed and he verbalized understanding on the instructions given.

## 2021-03-14 ENCOUNTER — Ambulatory Visit: Payer: Medicare Other | Admitting: Nurse Practitioner

## 2021-03-26 ENCOUNTER — Other Ambulatory Visit: Payer: Self-pay

## 2021-03-26 ENCOUNTER — Emergency Department (HOSPITAL_COMMUNITY): Payer: Medicare Other

## 2021-03-26 ENCOUNTER — Encounter (HOSPITAL_COMMUNITY): Payer: Self-pay | Admitting: Emergency Medicine

## 2021-03-26 ENCOUNTER — Emergency Department (HOSPITAL_COMMUNITY)
Admission: EM | Admit: 2021-03-26 | Discharge: 2021-03-26 | Disposition: A | Discharge status: Home / Self Care | Payer: Medicare Other | Attending: Emergency Medicine | Admitting: Emergency Medicine

## 2021-03-26 DIAGNOSIS — R1084 Generalized abdominal pain: Secondary | ICD-10-CM | POA: Diagnosis not present

## 2021-03-26 DIAGNOSIS — K5900 Constipation, unspecified: Secondary | ICD-10-CM | POA: Diagnosis not present

## 2021-03-26 DIAGNOSIS — Z7984 Long term (current) use of oral hypoglycemic drugs: Secondary | ICD-10-CM | POA: Diagnosis not present

## 2021-03-26 DIAGNOSIS — Z87891 Personal history of nicotine dependence: Secondary | ICD-10-CM | POA: Insufficient documentation

## 2021-03-26 DIAGNOSIS — Z79899 Other long term (current) drug therapy: Secondary | ICD-10-CM | POA: Diagnosis not present

## 2021-03-26 DIAGNOSIS — I10 Essential (primary) hypertension: Secondary | ICD-10-CM | POA: Diagnosis not present

## 2021-03-26 DIAGNOSIS — R109 Unspecified abdominal pain: Secondary | ICD-10-CM | POA: Diagnosis present

## 2021-03-26 DIAGNOSIS — M549 Dorsalgia, unspecified: Secondary | ICD-10-CM | POA: Diagnosis not present

## 2021-03-26 DIAGNOSIS — E785 Hyperlipidemia, unspecified: Secondary | ICD-10-CM | POA: Insufficient documentation

## 2021-03-26 DIAGNOSIS — E1169 Type 2 diabetes mellitus with other specified complication: Secondary | ICD-10-CM | POA: Insufficient documentation

## 2021-03-26 DIAGNOSIS — K219 Gastro-esophageal reflux disease without esophagitis: Secondary | ICD-10-CM | POA: Insufficient documentation

## 2021-03-26 LAB — CBC WITH DIFFERENTIAL/PLATELET
Abs Immature Granulocytes: 0.07 10*3/uL (ref 0.00–0.07)
Basophils Absolute: 0.1 10*3/uL (ref 0.0–0.1)
Basophils Relative: 1 %
Eosinophils Absolute: 0.2 10*3/uL (ref 0.0–0.5)
Eosinophils Relative: 3 %
HCT: 41.6 % (ref 39.0–52.0)
Hemoglobin: 13.7 g/dL (ref 13.0–17.0)
Immature Granulocytes: 1 %
Lymphocytes Relative: 23 %
Lymphs Abs: 2 10*3/uL (ref 0.7–4.0)
MCH: 27.8 pg (ref 26.0–34.0)
MCHC: 32.9 g/dL (ref 30.0–36.0)
MCV: 84.4 fL (ref 80.0–100.0)
Monocytes Absolute: 0.7 10*3/uL (ref 0.1–1.0)
Monocytes Relative: 8 %
Neutro Abs: 5.7 10*3/uL (ref 1.7–7.7)
Neutrophils Relative %: 64 %
Platelets: 320 10*3/uL (ref 150–400)
RBC: 4.93 MIL/uL (ref 4.22–5.81)
RDW: 13.6 % (ref 11.5–15.5)
WBC: 8.8 10*3/uL (ref 4.0–10.5)
nRBC: 0 % (ref 0.0–0.2)

## 2021-03-26 LAB — COMPREHENSIVE METABOLIC PANEL
ALT: 29 U/L (ref 0–44)
AST: 28 U/L (ref 15–41)
Albumin: 3.6 g/dL (ref 3.5–5.0)
Alkaline Phosphatase: 98 U/L (ref 38–126)
Anion gap: 12 (ref 5–15)
BUN: 6 mg/dL — ABNORMAL LOW (ref 8–23)
CO2: 22 mmol/L (ref 22–32)
Calcium: 9.2 mg/dL (ref 8.9–10.3)
Chloride: 105 mmol/L (ref 98–111)
Creatinine, Ser: 0.75 mg/dL (ref 0.61–1.24)
GFR, Estimated: >60 mL/min (ref >60)
Glucose, Bld: 133 mg/dL — ABNORMAL HIGH (ref 70–99)
Potassium: 3.8 mmol/L (ref 3.5–5.1)
Sodium: 139 mmol/L (ref 135–145)
Total Bilirubin: 0.5 mg/dL (ref 0.3–1.2)
Total Protein: 7 g/dL (ref 6.5–8.1)

## 2021-03-26 LAB — URINALYSIS, ROUTINE W REFLEX MICROSCOPIC
Bacteria, UA: NONE SEEN
Bilirubin Urine: NEGATIVE
Glucose, UA: >=500 mg/dL — AB
Hgb urine dipstick: NEGATIVE
Ketones, ur: NEGATIVE mg/dL
Leukocytes,Ua: NEGATIVE
Nitrite: NEGATIVE
Protein, ur: NEGATIVE mg/dL
Specific Gravity, Urine: 1.032 — ABNORMAL HIGH (ref 1.005–1.030)
pH: 6 (ref 5.0–8.0)

## 2021-03-26 LAB — LIPASE, BLOOD: Lipase: 40 U/L (ref 11–51)

## 2021-03-26 MED ORDER — ACETAMINOPHEN 500 MG PO TABS
1000.0000 mg | ORAL_TABLET | Freq: Once | ORAL | Status: AC
  Administered 2021-03-26: 1000 mg via ORAL
  Filled 2021-03-26: qty 2

## 2021-03-26 NOTE — ED Provider Notes (Signed)
[facility] EMERGENCY DEPARTMENT Provider Note   CSN: [id number] Arrival date & time: 03/26/21  4098     History Chief Complaint  Patient presents with   Constipation   Abdominal Pain    Riley Clarke is a 65 y.o. male.   Constipation Associated symptoms: abdominal pain and back pain   Abdominal Pain Associated symptoms: constipation   Associated symptoms: no chest pain and no shortness of breath   Patient presents with constipation.  States does has not really had good bowel movements since the end of last month.  Had been on some pain medicines for back pain after he was spinal surgery.  States pain is doing okay in the spine and states that he has not been taking pain medicines, however states not having any bowel movements.  Still passing some gas.  No weakness.  No fevers.  No dysuria.  Has taken some MiraLAX and stool softener without much relief.  States he feels like he needs to have a bowel movement when he strains it hurts in his back.    Past Medical History:  Diagnosis Date   Arthritis    Chronic back pain    on chronic opioids   Diabetes mellitus without complication (HCC)    GERD (gastroesophageal reflux disease)    Headache(784.0)    HLD (hyperlipidemia)    Hypertension    no meds   Vitamin D deficiency     Patient Active Problem List   Diagnosis Date Noted   Lumbar stenosis with neurogenic claudication 03/09/2021   Bloating symptom 10/07/2020   Diastasis recti 10/07/2020   Nocturia 10/07/2020   Abdominal distension 08/24/2020   Body mass index (BMI) 30.0-30.9, adult 09/16/2019   Elevated blood-pressure reading, without diagnosis of hypertension 09/16/2019   Cervical radiculitis 02/21/2016   Opioid dependence (HCC) 02/21/2016   Displacement of lumbar intervertebral disc without myelopathy 04/30/2013   Diabetes (HCC) 09/12/2012   Spinal stenosis of lumbar region 09/10/2012    Past Surgical History:  Procedure Laterality Date    CERVICAL DISC SURGERY  99   LUMBAR FUSION     LUMBAR LAMINECTOMY/DECOMPRESSION MICRODISCECTOMY Bilateral 03/09/2021   Procedure: Laminectomy and Foraminotomy - bilateral - Lumbar two-Lumbar three;  Surgeon: Julio Sicks, MD;  Location: [facility] OR;  Service: Neurosurgery;  Laterality: Bilateral;   ROTATOR CUFF REPAIR Bilateral    SHOULDER ARTHROSCOPY WITH ROTATOR CUFF REPAIR AND SUBACROMIAL DECOMPRESSION Right 12/30/2018   Procedure: SHOULDER ARTHROSCOPY WITH ROTATOR CUFF REPAIR AND SUBACROMIAL DECOMPRESSION, DISTAL CLAVICLE EXICISION,;  Surgeon: Bjorn Pippin, MD;  Location: [facility];  Service: Orthopedics;  Laterality: Right;   SHOULDER ARTHROSCOPY WITH SUBACROMIAL DECOMPRESSION, ROTATOR CUFF REPAIR AND BICEP TENDON REPAIR Left 06/19/2019   Procedure: LEFT SHOULDER ARTHROSCOPY, EXTENTSIVE DEBRIDEMENT, DISTAL CLAVICULECTOMY, SUBACROMIAL DECOMPRESSION, PARTIAL ACROMIOPLASTY WITH CORACOACROMIAL RELEASE, ROTATOR CUFFF REPAIR AND BICEP TENODESIS;  Surgeon: Bjorn Pippin, MD;  Location: [facility];  Service: Orthopedics;  Laterality: Left;       Family History  Problem Relation Age of Onset   Heart disease Mother    Diabetes Mother    Amblyopia Neg Hx    Blindness Neg Hx    Cataracts Neg Hx    Hypertension Neg Hx    Macular degeneration Neg Hx    Retinal detachment Neg Hx    Strabismus Neg Hx    Esophageal cancer Neg Hx    Colon cancer Neg Hx    Pancreatic cancer Neg Hx    Ulcerative  colitis Neg Hx    Inflammatory bowel disease Neg Hx    Liver disease Neg Hx    Rectal cancer Neg Hx    Stomach cancer Neg Hx     Social History   Tobacco Use   Smoking status: Former    Packs/day: 0.25    Years: 25.00    Pack years: 6.25    Types: Cigarettes    Quit date: 04/26/2020    Years since quitting: 0.9   Smokeless tobacco: Never  Vaping Use   Vaping Use: Never used  Substance Use Topics   Alcohol use: Not Currently    Alcohol/week: 4.0 standard drinks     Types: 4 Cans of beer per week    Comment: twice a week   Drug use: No    Home Medications Prior to Admission medications   Medication Sig Start Date End Date Taking? Authorizing Provider  empagliflozin (JARDIANCE) 25 MG TABS tablet Take 1 tablet (25 mg total) by mouth daily. 01/11/21 01/11/22 Yes King, Shana Chute, NP  esomeprazole (NEXIUM) 40 MG capsule TAKE ONE (1) CAPSULE BY MOUTH EVERY MORNING BEFORE BREAKFAST Patient taking differently: Take 40 mg by mouth daily. 01/11/21  Yes Barbette Merino, NP  finasteride (PROSCAR) 5 MG tablet Take 5 mg by mouth daily.   Yes [provider]  gabapentin (NEURONTIN) 300 MG capsule Take 1 capsule (300 mg total) by mouth 3 (three) times daily. 01/11/21 01/11/22 Yes King, Shana Chute, NP  ibuprofen (ADVIL) 200 MG tablet Take 800 mg by mouth every 6 (six) hours as needed for headache or mild pain.   Yes [provider]  linagliptin (TRADJENTA) 5 MG TABS tablet Take 1 tablet (5 mg total) by mouth daily. 01/11/21 01/11/22 Yes King, Shana Chute, NP  lisinopril-hydrochlorothiazide (ZESTORETIC) 20-25 MG tablet Take 1 tablet by mouth daily.   Yes [provider]  metFORMIN (GLUCOPHAGE) 1000 MG tablet Take 1 tablet (1,000 mg total) by mouth 2 (two) times daily with a meal. TAKE 1 TABLET BY MOUTH 2 TIMES DAILY WITH A MEAL. 01/11/21 01/11/22 Yes King, Shana Chute, NP  simvastatin (ZOCOR) 10 MG tablet Take 10 mg by mouth daily.   Yes [provider]  TRULICITY 1.5 MG/0.5ML SOPN Inject 1.5 mg into the skin every Monday. 02/15/21  Yes [provider]  cyclobenzaprine (FLEXERIL) 10 MG tablet Take 1 tablet (10 mg total) by mouth 3 (three) times daily as needed for muscle spasms. Patient not taking: No sig reported 03/10/21   Val Eagle D, NP  glucose blood (ACCU-CHEK AVIVA) test strip Use as instructed 04/05/15   Henrietta Hoover, NP    Allergies    Cymbalta [duloxetine hcl], Shrimp [shellfish allergy], and Valium [diazepam]  Review of Systems    Review of Systems  Constitutional:  Negative for appetite change.  HENT:  Negative for congestion.   Respiratory:  Negative for shortness of breath.   Cardiovascular:  Negative for chest pain.  Gastrointestinal:  Positive for abdominal pain and constipation.  Genitourinary:  Negative for flank pain.  Musculoskeletal:  Positive for back pain.  Skin:  Negative for rash.  Neurological:  Negative for weakness.  Psychiatric/Behavioral:  Negative for confusion.    Physical Exam Updated Vital Signs BP (!) 159/88   Pulse 66   Temp 98.7 F (37.1 C) (Oral)   Resp 18   SpO2 98%   Physical Exam Vitals and nursing note reviewed.  HENT:     Head: Normocephalic.  Cardiovascular:  Rate and Rhythm: Regular rhythm.  Pulmonary:     Breath sounds: Normal breath sounds.  Abdominal:     Comments: Mild right-sided and left mid tenderness.  No rebound or guarding.  No hernia palpated.  Skin:    General: Skin is warm.     Capillary Refill: Capillary refill takes less than 2 seconds.  Neurological:     Mental Status: He is alert and oriented to person, place, and time.  Psychiatric:        Behavior: Behavior normal.    ED Results / Procedures / Treatments   Labs (all labs ordered are listed, but only abnormal results are displayed) Labs Reviewed  COMPREHENSIVE METABOLIC PANEL - Abnormal; Notable for the following components:      Result Value   Glucose, Bld 133 (*)    BUN 6 (*)    All other components within normal limits  URINALYSIS, ROUTINE W REFLEX MICROSCOPIC - Abnormal; Notable for the following components:   Specific Gravity, Urine 1.032 (*)    Glucose, UA >=500 (*)    All other components within normal limits  CBC WITH DIFFERENTIAL/PLATELET  LIPASE, BLOOD    EKG None  Radiology DG Abd 2 Views  Result Date: 03/26/2021 CLINICAL DATA:  Generalized abdominal pain constipation. EXAM: ABDOMEN - 2 VIEW COMPARISON:  None. FINDINGS: No dilated large or small bowel loops are  identified. Single nonspecific fluid level in the LEFT abdomen. No evidence of free intraperitoneal air. No evidence of renal or ureteral calculi. Fixation hardware within the lower lumbar spine. Visualized osseous structures are otherwise unremarkable. IMPRESSION: Nonobstructive bowel gas pattern. Single nonspecific fluid level in the LEFT abdomen, possibly localized ileus. Electronically Signed   By: Bary Richard M.D.   On: 03/26/2021 12:03    Procedures Procedures   Medications Ordered in ED Medications  acetaminophen (TYLENOL) tablet 1,000 mg (1,000 mg Oral Given 03/26/21 3614)    ED Course  I have reviewed the triage vital signs and the nursing notes.  Pertinent labs & imaging results that were available during my care of the patient were reviewed by me and considered in my medical decision making (see chart for details).    MDM Rules/Calculators/A&P                           Patient with postsurgical constipation.  Did have back surgery, however other numbness and weakness is not present.  States he does have some mild back pain with straining but otherwise doing better.  Doubt lumbar claudication as the cause.  X-ray today shows a fair amount of stool.  Rather benign abdominal exam and labs reassuring.  Discussed with patient about rectal exam versus more stool softeners.  At this point will defer the rectal exam.  Will increase his MiraLAX.  Follow-up as needed.  Will discharge home.  Doubt obstruction.  Possible localized ileus without obstruction on Final Clinical Impression(s) / ED Diagnoses Final diagnoses:  Constipation  Constipation, unspecified constipation type    Rx / DC Orders ED Discharge Orders     None        Benjiman Core, MD 03/26/21 1850

## 2021-03-26 NOTE — Discharge Instructions (Addendum)
We will increase her MiraLAX to 2 or even 3 times a day.  Watch for fevers.  Watch for increasing pain.  Follow-up with your doctors.

## 2021-03-26 NOTE — ED Provider Notes (Signed)
Emergency Medicine Provider Triage Evaluation Note  Riley Clarke , a 65 y.o. male  was evaluated in triage.  Pt complains of abdominal pain and constipation.  Patient had a spinal procedure done towards a December, reports he has not had a bowel movement since 03/09/2021.  He passed gas yesterday, and none today.  He has tried MiraLAX and stool softeners without relief.  Denies any nausea or vomiting, no fever or chills..  Review of Systems  Positive: Constipation, abdominal pain Negative: Nausea, vomiting  Physical Exam  BP (!) 158/85 (BP Location: Right Arm)   Pulse 89   Temp 98.7 F (37.1 C) (Oral)   Resp 17   SpO2 100%  Gen:   Awake, no distress   Resp:  Normal effort  MSK:   Moves extremities without difficulty  Other:  Abdomen distended, lower quadrant diffuse tenderness.  Medical Decision Making  Medically screening exam initiated at 9:20 AM.  Appropriate orders placed.  Riley Clarke was informed that the remainder of the evaluation will be completed by another provider, this initial triage assessment does not replace that evaluation, and the importance of remaining in the ED until their evaluation is complete.  Vital steady, does not appear septic.  Abdominal labs and CT   Theron Arista, Cordelia Poche 03/26/21 1610    Benjiman Core, MD 03/26/21 1054

## 2021-03-26 NOTE — ED Triage Notes (Signed)
Pt reports generalized abd pain and constipation.  Last BM 9/28.  Flatulence yesterday. Taking Miralax and stool softeners without relief.

## 2021-03-28 DIAGNOSIS — M961 Postlaminectomy syndrome, not elsewhere classified: Secondary | ICD-10-CM | POA: Diagnosis not present

## 2021-03-28 DIAGNOSIS — M179 Osteoarthritis of knee, unspecified: Secondary | ICD-10-CM | POA: Diagnosis not present

## 2021-03-28 DIAGNOSIS — G8929 Other chronic pain: Secondary | ICD-10-CM | POA: Diagnosis not present

## 2021-03-28 DIAGNOSIS — M1990 Unspecified osteoarthritis, unspecified site: Secondary | ICD-10-CM | POA: Diagnosis not present

## 2021-04-05 ENCOUNTER — Telehealth: Payer: Self-pay

## 2021-04-05 DIAGNOSIS — M545 Low back pain, unspecified: Secondary | ICD-10-CM | POA: Diagnosis not present

## 2021-04-05 DIAGNOSIS — G894 Chronic pain syndrome: Secondary | ICD-10-CM | POA: Diagnosis not present

## 2021-04-05 NOTE — Telephone Encounter (Signed)
GABAPENTIN

## 2021-04-07 ENCOUNTER — Other Ambulatory Visit: Payer: Self-pay

## 2021-04-07 ENCOUNTER — Ambulatory Visit (INDEPENDENT_AMBULATORY_CARE_PROVIDER_SITE_OTHER): Payer: Medicare Other | Admitting: Nurse Practitioner

## 2021-04-07 ENCOUNTER — Encounter: Payer: Self-pay | Admitting: Nurse Practitioner

## 2021-04-07 VITALS — BP 178/85 | HR 91 | Temp 97.3°F | Ht 71.5 in | Wt 234.0 lb

## 2021-04-07 DIAGNOSIS — E119 Type 2 diabetes mellitus without complications: Secondary | ICD-10-CM

## 2021-04-07 DIAGNOSIS — N401 Enlarged prostate with lower urinary tract symptoms: Secondary | ICD-10-CM

## 2021-04-07 DIAGNOSIS — I152 Hypertension secondary to endocrine disorders: Secondary | ICD-10-CM

## 2021-04-07 DIAGNOSIS — R3911 Hesitancy of micturition: Secondary | ICD-10-CM

## 2021-04-07 DIAGNOSIS — M48062 Spinal stenosis, lumbar region with neurogenic claudication: Secondary | ICD-10-CM | POA: Diagnosis not present

## 2021-04-07 MED ORDER — LISINOPRIL-HYDROCHLOROTHIAZIDE 20-25 MG PO TABS: 1.0000 | ORAL_TABLET | Freq: Every day | ORAL | 3 refills | Status: DC

## 2021-04-07 MED ORDER — TRULICITY 1.5 MG/0.5ML ~~LOC~~ SOAJ: 1.5000 mg | SUBCUTANEOUS | 3 refills | Status: DC

## 2021-04-07 MED ORDER — FINASTERIDE 5 MG PO TABS: 5.0000 mg | ORAL_TABLET | Freq: Every day | ORAL | 3 refills | Status: DC

## 2021-04-07 MED ORDER — SIMVASTATIN 10 MG PO TABS: 10.0000 mg | ORAL_TABLET | Freq: Every day | ORAL | 3 refills | Status: DC

## 2021-04-07 NOTE — Patient Instructions (Signed)
Diabetes Mellitus and Nutrition, Adult When you have diabetes, or diabetes mellitus, it is very important to have healthy eating habits because your blood sugar (glucose) levels are greatly affected by what you eat and drink. Eating healthy foods in the right amounts, at about the same times every day, can help you:  Control your blood glucose.  Lower your risk of heart disease.  Improve your blood pressure.  Reach or maintain a healthy weight. What can affect my meal plan? Every person with diabetes is different, and each person has different needs for a meal plan. Your health care provider may recommend that you work with a dietitian to make a meal plan that is best for you. Your meal plan may vary depending on factors such as:  The calories you need.  The medicines you take.  Your weight.  Your blood glucose, blood pressure, and cholesterol levels.  Your activity level.  Other health conditions you have, such as heart or kidney disease. How do carbohydrates affect me? Carbohydrates, also called carbs, affect your blood glucose level more than any other type of food. Eating carbs naturally raises the amount of glucose in your blood. Carb counting is a method for keeping track of how many carbs you eat. Counting carbs is important to keep your blood glucose at a healthy level, especially if you use insulin or take certain oral diabetes medicines. It is important to know how many carbs you can safely have in each meal. This is different for every person. Your dietitian can help you calculate how many carbs you should have at each meal and for each snack. How does alcohol affect me? Alcohol can cause a sudden decrease in blood glucose (hypoglycemia), especially if you use insulin or take certain oral diabetes medicines. Hypoglycemia can be a life-threatening condition. Symptoms of hypoglycemia, such as sleepiness, dizziness, and confusion, are similar to symptoms of having too much  alcohol.  Do not drink alcohol if: ? Your health care provider tells you not to drink. ? You are pregnant, may be pregnant, or are planning to become pregnant.  If you drink alcohol: ? Do not drink on an empty stomach. ? Limit how much you use to:  0-1 drink a day for women.  0-2 drinks a day for men. ? Be aware of how much alcohol is in your drink. In the [place] one drink equals one 12 oz bottle of beer (355 mL), one 5 oz glass of wine (148 mL), or one 1 oz glass of hard liquor (44 mL). ? Keep yourself hydrated with water, diet soda, or unsweetened iced tea.  Keep in mind that regular soda, juice, and other mixers may contain a lot of sugar and must be counted as carbs. What are tips for following this plan? Reading food labels  Start by checking the serving size on the "Nutrition Facts" label of packaged foods and drinks. The amount of calories, carbs, fats, and other nutrients listed on the label is based on one serving of the item. Many items contain more than one serving per package.  Check the total grams (g) of carbs in one serving. You can calculate the number of servings of carbs in one serving by dividing the total carbs by 15. For example, if a food has 30 g of total carbs per serving, it would be equal to 2 servings of carbs.  Check the number of grams (g) of saturated fats and trans fats in one serving. Choose foods that have   a low amount or none of these fats.  Check the number of milligrams (mg) of salt (sodium) in one serving. Most people should limit total sodium intake to less than 2,300 mg per day.  Always check the nutrition information of foods labeled as "low-fat" or "nonfat." These foods may be higher in added sugar or refined carbs and should be avoided.  Talk to your dietitian to identify your daily goals for nutrients listed on the label. Shopping  Avoid buying canned, pre-made, or processed foods. These foods tend to be high in fat, sodium, and added  sugar.  Shop around the outside edge of the grocery store. This is where you will most often find fresh fruits and vegetables, bulk grains, fresh meats, and fresh dairy. Cooking  Use low-heat cooking methods, such as baking, instead of high-heat cooking methods like deep frying.  Cook using healthy oils, such as olive, canola, or sunflower oil.  Avoid cooking with butter, cream, or high-fat meats. Meal planning  Eat meals and snacks regularly, preferably at the same times every day. Avoid going long periods of time without eating.  Eat foods that are high in fiber, such as fresh fruits, vegetables, beans, and whole grains. Talk with your dietitian about how many servings of carbs you can eat at each meal.  Eat 4-6 oz (112-168 g) of lean protein each day, such as lean meat, chicken, fish, eggs, or tofu. One ounce (oz) of lean protein is equal to: ? 1 oz (28 g) of meat, chicken, or fish. ? 1 egg. ?  cup (62 g) of tofu.  Eat some foods each day that contain healthy fats, such as avocado, nuts, seeds, and fish.   What foods should I eat? Fruits Berries. Apples. Oranges. Peaches. Apricots. Plums. Grapes. Mango. Papaya. Pomegranate. Kiwi. Cherries. Vegetables Lettuce. Spinach. Leafy greens, including kale, chard, collard greens, and mustard greens. Beets. Cauliflower. Cabbage. Broccoli. Carrots. Green beans. Tomatoes. Peppers. Onions. Cucumbers. Brussels sprouts. Grains Whole grains, such as whole-wheat or whole-grain bread, crackers, tortillas, cereal, and pasta. Unsweetened oatmeal. Quinoa. Brown or wild rice. Meats and other proteins Seafood. Poultry without skin. Lean cuts of poultry and beef. Tofu. Nuts. Seeds. Dairy Low-fat or fat-free dairy products such as milk, yogurt, and cheese. The items listed above may not be a complete list of foods and beverages you can eat. Contact a dietitian for more information. What foods should I avoid? Fruits Fruits canned with  syrup. Vegetables Canned vegetables. Frozen vegetables with butter or cream sauce. Grains Refined white flour and flour products such as bread, pasta, snack foods, and cereals. Avoid all processed foods. Meats and other proteins Fatty cuts of meat. Poultry with skin. Breaded or fried meats. Processed meat. Avoid saturated fats. Dairy Full-fat yogurt, cheese, or milk. Beverages Sweetened drinks, such as soda or iced tea. The items listed above may not be a complete list of foods and beverages you should avoid. Contact a dietitian for more information. Questions to ask a health care provider  Do I need to meet with a diabetes educator?  Do I need to meet with a dietitian?  What number can I call if I have questions?  When are the best times to check my blood glucose? Where to find more information:  American Diabetes Association: diabetes.org  Academy of Nutrition and Dietetics: www.eatright.org  National Institute of Diabetes and Digestive and Kidney Diseases: www.niddk.nih.gov  Association of Diabetes Care and Education Specialists: www.diabeteseducator.org Summary  It is important to have healthy eating   habits because your blood sugar (glucose) levels are greatly affected by what you eat and drink.  A healthy meal plan will help you control your blood glucose and maintain a healthy lifestyle.  Your health care provider may recommend that you work with a dietitian to make a meal plan that is best for you.  Keep in mind that carbohydrates (carbs) and alcohol have immediate effects on your blood glucose levels. It is important to count carbs and to use alcohol carefully. This information is not intended to replace advice given to you by your health care provider. Make sure you discuss any questions you have with your health care provider. Document Revised: 05/06/2019 Document Reviewed: 05/06/2019 Elsevier Patient Education  2021 Elsevier Inc.  

## 2021-04-07 NOTE — Progress Notes (Addendum)
[facility] Patient [facility] [address] [place], [place]  [address] Phone:  [phone]   Fax:  [phone]   Established Patient Office Visit  Subjective:  Patient ID: Riley Clarke, male    DOB: 23-May-1956  Age: 65 y.o. MRN: [id number]  CC:  Chief Complaint  Patient presents with   Follow-up    2 month follow up, back surgery on 9/28    HPI Riley Clarke presents for follow up. He  has a past medical history of Arthritis, Chronic back pain, Diabetes mellitus without complication (HCC), GERD (gastroesophageal reflux disease), Headache(784.0), HLD (hyperlipidemia), Hypertension, and Vitamin D deficiency.   He was by urology and there were no changes made to his current treatment. He reports that finasteride is effective.   He is status post bilateral laminectomy and foraminotomy of L2-3 by Dr Jordan Likes. He reports some incisional drainage that he has been treated for on one occasion. The drainage does continue. Denies headache, dizziness, visual changes, shortness of breath, dyspnea on exertion, chest pain, nausea, vomiting, weakness or any edema.   He continues to follow up with pain management. He reports the recommendation for interventional therapy however he declines a need for this.   He reports that he is unsure why his BP is elevated.    Past Medical History:  Diagnosis Date   Arthritis    Chronic back pain    on chronic opioids   Diabetes mellitus without complication (HCC)    GERD (gastroesophageal reflux disease)    Headache(784.0)    HLD (hyperlipidemia)    Hypertension    no meds   Vitamin D deficiency     Past Surgical History:  Procedure Laterality Date   CERVICAL DISC SURGERY  99   LUMBAR FUSION     LUMBAR LAMINECTOMY/DECOMPRESSION MICRODISCECTOMY Bilateral 03/09/2021   Procedure: Laminectomy and Foraminotomy - bilateral - Lumbar two-Lumbar three;  Surgeon: Julio Sicks, MD;  Location: [facility] OR;  Service: Neurosurgery;  Laterality: Bilateral;    ROTATOR CUFF REPAIR Bilateral    SHOULDER ARTHROSCOPY WITH ROTATOR CUFF REPAIR AND SUBACROMIAL DECOMPRESSION Right 12/30/2018   Procedure: SHOULDER ARTHROSCOPY WITH ROTATOR CUFF REPAIR AND SUBACROMIAL DECOMPRESSION, DISTAL CLAVICLE EXICISION,;  Surgeon: Bjorn Pippin, MD;  Location: [facility];  Service: Orthopedics;  Laterality: Right;   SHOULDER ARTHROSCOPY WITH SUBACROMIAL DECOMPRESSION, ROTATOR CUFF REPAIR AND BICEP TENDON REPAIR Left 06/19/2019   Procedure: LEFT SHOULDER ARTHROSCOPY, EXTENTSIVE DEBRIDEMENT, DISTAL CLAVICULECTOMY, SUBACROMIAL DECOMPRESSION, PARTIAL ACROMIOPLASTY WITH CORACOACROMIAL RELEASE, ROTATOR CUFFF REPAIR AND BICEP TENODESIS;  Surgeon: Bjorn Pippin, MD;  Location: [facility];  Service: Orthopedics;  Laterality: Left;    Family History  Problem Relation Age of Onset   Heart disease Mother    Diabetes Mother    Amblyopia Neg Hx    Blindness Neg Hx    Cataracts Neg Hx    Hypertension Neg Hx    Macular degeneration Neg Hx    Retinal detachment Neg Hx    Strabismus Neg Hx    Esophageal cancer Neg Hx    Colon cancer Neg Hx    Pancreatic cancer Neg Hx    Ulcerative colitis Neg Hx    Inflammatory bowel disease Neg Hx    Liver disease Neg Hx    Rectal cancer Neg Hx    Stomach cancer Neg Hx     Social History   Socioeconomic History   Marital status: Single    Spouse name: Not on file  Number of children: 3   Years of education: Not on file   Highest education level: Not on file  Occupational History   Occupation: disability  Tobacco Use   Smoking status: Former    Packs/day: 0.25    Years: 25.00    Pack years: 6.25    Types: Cigarettes    Quit date: 04/26/2020    Years since quitting: 0.9   Smokeless tobacco: Never  Vaping Use   Vaping Use: Never used  Substance and Sexual Activity   Alcohol use: Not Currently    Alcohol/week: 4.0 standard drinks    Types: 4 Cans of beer per week    Comment: twice a week   Drug use:  No   Sexual activity: Not on file  Other Topics Concern   Not on file  Social History Narrative   Right handed   Lives alone in a one story home   Social Determinants of Health   Financial Resource Strain: Not on file  Food Insecurity: Not on file  Transportation Needs: No Transportation Needs   Lack of Transportation (Medical): No   Lack of Transportation (Non-Medical): No  Physical Activity: Insufficiently Active   Days of Exercise per Week: 7 days   Minutes of Exercise per Session: 10 min  Stress: Not on file  Social Connections: Not on file  Intimate Partner Violence: Not At Risk   Fear of Current or Ex-Partner: No   Emotionally Abused: No   Physically Abused: No   Sexually Abused: No    Outpatient Medications Prior to Visit  Medication Sig Dispense Refill   cyclobenzaprine (FLEXERIL) 10 MG tablet Take 1 tablet (10 mg total) by mouth 3 (three) times daily as needed for muscle spasms. 30 tablet 0   empagliflozin (JARDIANCE) 25 MG TABS tablet Take 1 tablet (25 mg total) by mouth daily. 90 tablet 3   esomeprazole (NEXIUM) 40 MG capsule TAKE ONE (1) CAPSULE BY MOUTH EVERY MORNING BEFORE BREAKFAST (Patient taking differently: Take 40 mg by mouth daily.) 90 capsule 1   gabapentin (NEURONTIN) 300 MG capsule Take 1 capsule (300 mg total) by mouth 3 (three) times daily. 270 capsule 3   glucose blood (ACCU-CHEK AVIVA) test strip Use as instructed 100 each 12   ibuprofen (ADVIL) 200 MG tablet Take 800 mg by mouth every 6 (six) hours as needed for headache or mild pain.     linagliptin (TRADJENTA) 5 MG TABS tablet Take 1 tablet (5 mg total) by mouth daily. 90 tablet 3   metFORMIN (GLUCOPHAGE) 1000 MG tablet Take 1 tablet (1,000 mg total) by mouth 2 (two) times daily with a meal. TAKE 1 TABLET BY MOUTH 2 TIMES DAILY WITH A MEAL. 180 tablet 3   finasteride (PROSCAR) 5 MG tablet Take 5 mg by mouth daily.     lisinopril-hydrochlorothiazide (ZESTORETIC) 20-25 MG tablet Take 1 tablet by mouth  daily.     simvastatin (ZOCOR) 10 MG tablet Take 10 mg by mouth daily.     TRULICITY 1.5 MG/0.5ML SOPN Inject 1.5 mg into the skin every Monday.     No facility-administered medications prior to visit.    Allergies  Allergen Reactions   Cymbalta [Duloxetine Hcl] Nausea And Vomiting and Other (See Comments)    Dizziness/headaches   Shrimp [Shellfish Allergy]     intolerant   Valium [Diazepam] Itching    ROS Review of Systems    Objective:    Physical Exam HENT:     Head: Normocephalic and atraumatic.  Nose: Nose normal.     Mouth/Throat:     Mouth: Mucous membranes are moist.  Cardiovascular:     Rate and Rhythm: Normal rate.     Pulses: Normal pulses.  Pulmonary:     Effort: Pulmonary effort is normal.  Abdominal:     Palpations: Abdomen is soft.  Musculoskeletal:     Cervical back: Normal range of motion.     Right lower leg: No edema.     Left lower leg: No edema.     Comments: Bandaid in place no active drainage  Skin:    General: Skin is warm.     Capillary Refill: Capillary refill takes less than 2 seconds.  Neurological:     General: No focal deficit present.     Mental Status: He is alert and oriented to person, place, and time.  Psychiatric:        Mood and Affect: Mood normal.        Behavior: Behavior normal.        Thought Content: Thought content normal.        Judgment: Judgment normal.    BP (!) 178/85   Pulse 91   Temp (!) 97.3 F (36.3 C)   Ht 5' 11.5" (1.816 m)   Wt 234 lb (106.1 kg)   SpO2 99%   BMI 32.18 kg/m  Wt Readings from Last 3 Encounters:  04/07/21 234 lb (106.1 kg)  03/09/21 232 lb (105.2 kg)  03/02/21 232 lb 4.8 oz (105.4 kg)     Health Maintenance Due  Topic Date Due   OPHTHALMOLOGY EXAM  03/23/2019    There are no preventive care reminders to display for this patient.  Lab Results  Component Value Date   TSH 2.164 07/05/2015   Lab Results  Component Value Date   WBC 8.8 03/26/2021   HGB 13.7 03/26/2021    HCT 41.6 03/26/2021   MCV 84.4 03/26/2021   PLT 320 03/26/2021   Lab Results  Component Value Date   NA 139 03/26/2021   K 3.8 03/26/2021   CO2 22 03/26/2021   GLUCOSE 133 (H) 03/26/2021   BUN 6 (L) 03/26/2021   CREATININE 0.75 03/26/2021   BILITOT 0.5 03/26/2021   ALKPHOS 98 03/26/2021   AST 28 03/26/2021   ALT 29 03/26/2021   PROT 7.0 03/26/2021   ALBUMIN 3.6 03/26/2021   CALCIUM 9.2 03/26/2021   ANIONGAP 12 03/26/2021   GFR 93.22 08/24/2020   Lab Results  Component Value Date   CHOL 133 07/26/2020   Lab Results  Component Value Date   HDL 63 07/26/2020   Lab Results  Component Value Date   LDLCALC 51 07/26/2020   Lab Results  Component Value Date   TRIG 105 07/26/2020   Lab Results  Component Value Date   CHOLHDL 2.1 07/26/2020   Lab Results  Component Value Date   HGBA1C 8.2 (H) 03/02/2021      Assessment & Plan:   Problem List Items Addressed This Visit       Endocrine   Diabetes (HCC) Uncontrolled  Encourage compliance with current treatment regimen  Encourage regular CBG monitoring Encourage contacting office if excessive hyperglycemia and or hypoglycemia Lifestyle modification with healthy diet (fewer calories, more high fiber foods, whole grains and non-starchy vegetables, lower fat meat and fish, low-fat diary include healthy oils) regular exercise (physical activity) and weight loss Opthalmology exam discussed  Nutritional consult recommended Regular dental visits encouraged Home BP monitoring also encouraged goal <  130/80    Relevant Medications   lisinopril-hydrochlorothiazide (ZESTORETIC) 20-25 MG tablet   TRULICITY 1.5 MG/0.5ML SOPN (Start on 04/11/2021)   simvastatin (ZOCOR) 10 MG tablet     Other   Lumbar stenosis with neurogenic claudication Follow up with surgeon    Other Visit Diagnoses     Hypertension due to endocrine disorder    -  Primary Uncontrolled Encouraged on going compliance with current medication  regimen Encouraged home monitoring and recording BP <130/80 Eating a heart-healthy diet with less salt Encouraged regular physical activity  Recommend Weight loss     Relevant Medications   lisinopril-hydrochlorothiazide (ZESTORETIC) 20-25 MG tablet   simvastatin (ZOCOR) 10 MG tablet   Benign prostatic hyperplasia with urinary hesitancy     Stable   Relevant Medications   finasteride (PROSCAR) 5 MG tablet       Meds ordered this encounter  Medications   lisinopril-hydrochlorothiazide (ZESTORETIC) 20-25 MG tablet    Sig: Take 1 tablet by mouth daily.    Dispense:  90 tablet    Refill:  3    Order Specific Question:   Supervising Provider    Answer:   Quentin Angst [[id number]]   TRULICITY 1.5 MG/0.5ML SOPN    Sig: Inject 1.5 mg into the skin every Monday.    Dispense:  6 mL    Refill:  3    Order Specific Question:   Supervising Provider    Answer:   Quentin Angst [[id number]]   simvastatin (ZOCOR) 10 MG tablet    Sig: Take 1 tablet (10 mg total) by mouth daily.    Dispense:  90 tablet    Refill:  3    Order Specific Question:   Supervising Provider    Answer:   Quentin Angst [[id number]]   finasteride (PROSCAR) 5 MG tablet    Sig: Take 1 tablet (5 mg total) by mouth daily.    Dispense:  90 tablet    Refill:  3    Order Specific Question:   Supervising Provider    Answer:   Quentin Angst [id number]    Follow-up: Return in about 3 months (around 07/08/2021) for follow up [id number].    Barbette Merino, NP

## 2021-04-07 NOTE — Telephone Encounter (Signed)
Patient notified

## 2021-04-09 ENCOUNTER — Encounter: Payer: Self-pay | Admitting: Nurse Practitioner

## 2021-04-09 NOTE — Progress Notes (Signed)
   [facility] [address] [place], [place]  [address] Phone:  [phone]   Fax:  [phone] 

## 2021-04-10 ENCOUNTER — Telehealth: Payer: Self-pay | Admitting: Nurse Practitioner

## 2021-04-10 NOTE — Telephone Encounter (Signed)
Beth, please contact the patient and let him know I received his colonoscopy records dated 11/15/2010 by Dr. Evette Cristal with [facility].  His colonoscopy at that time was normal and a repeat colonoscopy was recommended in 5 years.  At the time of his office visit 08/24/2020 he thought his last colonoscopy was about 5 years ago.  Please contact the patient to schedule an office appointment to further discuss scheduling a colonoscopy.

## 2021-04-12 NOTE — Telephone Encounter (Signed)
Joni Reining Do you have the response from [facility] about the Cologuard report on this patient?

## 2021-04-20 NOTE — Telephone Encounter (Signed)
Colleen I am requesting Toma Copier to look for any colonoscopy report. We have the negative Cologuard report from 09/01/19.

## 2021-04-26 ENCOUNTER — Encounter: Payer: Self-pay | Admitting: Gastroenterology

## 2021-04-26 NOTE — Progress Notes (Signed)
2021 Cologuard test Negative  These results will be scanned into the chart.  He will be due for colon cancer screening in 2024.  Corliss Parish, MD [facility] Office # [phone]

## 2021-04-27 ENCOUNTER — Encounter: Payer: Self-pay | Admitting: Podiatry

## 2021-04-27 ENCOUNTER — Ambulatory Visit (INDEPENDENT_AMBULATORY_CARE_PROVIDER_SITE_OTHER): Payer: Medicare Other | Admitting: Podiatry

## 2021-04-27 ENCOUNTER — Other Ambulatory Visit: Payer: Self-pay

## 2021-04-27 DIAGNOSIS — M722 Plantar fascial fibromatosis: Secondary | ICD-10-CM

## 2021-04-27 MED ORDER — TRIAMCINOLONE ACETONIDE 10 MG/ML IJ SUSP
20.0000 mg | Freq: Once | INTRAMUSCULAR | Status: AC
  Administered 2021-04-27: 20 mg

## 2021-04-28 NOTE — Progress Notes (Signed)
Subjective:   Patient ID: Riley Clarke, male   DOB: 65 y.o.   MRN: [id number]   HPI Patient states has been doing pretty well and did have back surgery in September and states that his heels have started to hurt again and its been 3 months now   ROS      Objective:  Physical Exam  Neurovascular status intact with patient who has chronic fascial symptoms that were able to keep under control with periodic steroid injections and even though more than what I want to do I do not see him is a good surgical candidate     Assessment:  Acute Planter fasciitis bilateral     Plan:  Reviewed condition again and he agrees with this treatment plan understanding risk and today I did sterile prep and injected the plantar fascial bilateral at insertion calcaneus 3 mg dexamethasone Kenalog 5 mg Xylocaine advised on reduced activity reappoint in hopeful 3 to 4 months

## 2021-06-14 ENCOUNTER — Emergency Department (HOSPITAL_COMMUNITY)
Admission: EM | Admit: 2021-06-14 | Discharge: 2021-06-14 | Disposition: A | Discharge status: Home / Self Care | Payer: Medicare Other | Attending: Emergency Medicine | Admitting: Emergency Medicine

## 2021-06-14 ENCOUNTER — Other Ambulatory Visit: Payer: Self-pay

## 2021-06-14 ENCOUNTER — Encounter (HOSPITAL_COMMUNITY): Payer: Self-pay

## 2021-06-14 DIAGNOSIS — I959 Hypotension, unspecified: Secondary | ICD-10-CM | POA: Insufficient documentation

## 2021-06-14 DIAGNOSIS — R42 Dizziness and giddiness: Secondary | ICD-10-CM | POA: Diagnosis not present

## 2021-06-14 DIAGNOSIS — Z5321 Procedure and treatment not carried out due to patient leaving prior to being seen by health care provider: Secondary | ICD-10-CM | POA: Diagnosis not present

## 2021-06-14 NOTE — ED Notes (Signed)
Patient called x1 @ 1659, x1 @1720 , x1 @1744 

## 2021-06-14 NOTE — ED Triage Notes (Signed)
Pt arrived POV from home c/o hypotension and dizziness. Pt stated he started feeling dizzy and his legs locked up on him. He decided to check his BP and it was 80/63. Pt c/o of generalized pain.

## 2021-06-14 NOTE — ED Notes (Signed)
Patient called x 1 no response

## 2021-06-16 DIAGNOSIS — M545 Low back pain, unspecified: Secondary | ICD-10-CM | POA: Diagnosis not present

## 2021-06-16 DIAGNOSIS — Z79899 Other long term (current) drug therapy: Secondary | ICD-10-CM | POA: Diagnosis not present

## 2021-06-16 DIAGNOSIS — M542 Cervicalgia: Secondary | ICD-10-CM | POA: Diagnosis not present

## 2021-06-16 DIAGNOSIS — M5416 Radiculopathy, lumbar region: Secondary | ICD-10-CM | POA: Diagnosis not present

## 2021-06-16 DIAGNOSIS — E1165 Type 2 diabetes mellitus with hyperglycemia: Secondary | ICD-10-CM | POA: Diagnosis not present

## 2021-06-16 DIAGNOSIS — I10 Essential (primary) hypertension: Secondary | ICD-10-CM | POA: Diagnosis not present

## 2021-06-16 DIAGNOSIS — Z9181 History of falling: Secondary | ICD-10-CM | POA: Diagnosis not present

## 2021-06-16 DIAGNOSIS — G8929 Other chronic pain: Secondary | ICD-10-CM | POA: Diagnosis not present

## 2021-06-21 DIAGNOSIS — Z79899 Other long term (current) drug therapy: Secondary | ICD-10-CM | POA: Diagnosis not present

## 2021-07-08 ENCOUNTER — Other Ambulatory Visit: Payer: Self-pay

## 2021-07-08 ENCOUNTER — Ambulatory Visit (INDEPENDENT_AMBULATORY_CARE_PROVIDER_SITE_OTHER): Payer: Medicare Other | Admitting: Nurse Practitioner

## 2021-07-08 ENCOUNTER — Encounter: Payer: Self-pay | Admitting: Nurse Practitioner

## 2021-07-08 VITALS — BP 140/74 | HR 83 | Temp 97.9°F | Ht 71.5 in | Wt 232.5 lb

## 2021-07-08 DIAGNOSIS — E119 Type 2 diabetes mellitus without complications: Secondary | ICD-10-CM

## 2021-07-08 DIAGNOSIS — N401 Enlarged prostate with lower urinary tract symptoms: Secondary | ICD-10-CM

## 2021-07-08 DIAGNOSIS — I152 Hypertension secondary to endocrine disorders: Secondary | ICD-10-CM

## 2021-07-08 DIAGNOSIS — E782 Mixed hyperlipidemia: Secondary | ICD-10-CM

## 2021-07-08 DIAGNOSIS — R3911 Hesitancy of micturition: Secondary | ICD-10-CM | POA: Diagnosis not present

## 2021-07-08 LAB — POCT GLYCOSYLATED HEMOGLOBIN (HGB A1C): HbA1c POC (<> result, manual entry): 9.7 % (ref 4.0–5.6)

## 2021-07-08 NOTE — Progress Notes (Signed)
[place], [place]  [address] Phone:  [phone]   Fax:  [phone]   Established Patient Office Visit  Subjective:  Patient ID: Riley Clarke, male    DOB: [birth year]  Age: 67 y.o. MRN: [id number]  CC:  Chief Complaint  Patient presents with   Follow-up    Pt is here today for his 3 month follow up visit. Pt states that he has been having headaches x 1 week with no blurry vision or dizziness.    HPI Riley Clarke presents for follow up. He  has a past medical history of Arthritis, Chronic back pain, Diabetes mellitus without complication ([place]), GERD (gastroesophageal reflux disease), Headache(784.0), HLD (hyperlipidemia), Hypertension, and Vitamin D deficiency.   He is in today for a follow up on DM. He is prescribed Jardiance 25 mg and Tradjenta 5 mg, metformin 1000 mg and Trucility 1.5 g . His CBG range 120-130. Denies fever, chills, headache, dizziness, visual changes, polydipsia,  polyphagia, nausea, vomiting, polyuria, constipation, diarrhea.Marland Kitchen   He is also following up for HTN; prescribed Zestoretic . He was seen in the ED for hypotension 80-90/50. He reports that when he got to the ED it had improved. He left without being seen. He has had headaches. He reports the headache last about 3 minutes. Denies visual changes, shortness of breath, dyspnea on exertion, chest pain any edema.   He is compliant with all his medication. He will follow up with urology in the near future.   He is receiving pain management from [facility] .  Past Medical History:  Diagnosis Date   Arthritis    Chronic back pain    on chronic opioids   Diabetes mellitus without complication (HCC)    GERD (gastroesophageal reflux disease)    Headache(784.0)    HLD (hyperlipidemia)    Hypertension    no meds   Vitamin D deficiency     Past Surgical History:  Procedure Laterality Date   CERVICAL DISC SURGERY  99   LUMBAR FUSION     LUMBAR  LAMINECTOMY/DECOMPRESSION MICRODISCECTOMY Bilateral 03/09/2021   Procedure: Laminectomy and Foraminotomy - bilateral - Lumbar two-Lumbar three;  Surgeon: Earnie Larsson, MD;  Location: [facility];  Service: Neurosurgery;  Laterality: Bilateral;   ROTATOR CUFF REPAIR Bilateral    SHOULDER ARTHROSCOPY WITH ROTATOR CUFF REPAIR AND SUBACROMIAL DECOMPRESSION Right 12/30/2018   Procedure: SHOULDER ARTHROSCOPY WITH ROTATOR CUFF REPAIR AND SUBACROMIAL DECOMPRESSION, DISTAL CLAVICLE EXICISION,;  Surgeon: Hiram Gash, MD;  Location: [facility];  Service: Orthopedics;  Laterality: Right;   SHOULDER ARTHROSCOPY WITH SUBACROMIAL DECOMPRESSION, ROTATOR CUFF REPAIR AND BICEP TENDON REPAIR Left 06/19/2019   Procedure: LEFT SHOULDER ARTHROSCOPY, EXTENTSIVE DEBRIDEMENT, DISTAL CLAVICULECTOMY, SUBACROMIAL DECOMPRESSION, PARTIAL ACROMIOPLASTY WITH CORACOACROMIAL RELEASE, ROTATOR CUFFF REPAIR AND BICEP TENODESIS;  Surgeon: Hiram Gash, MD;  Location: [facility];  Service: Orthopedics;  Laterality: Left;    Family History  Problem Relation Age of Onset   Heart disease Mother    Diabetes Mother    Amblyopia Neg Hx    Blindness Neg Hx    Cataracts Neg Hx    Hypertension Neg Hx    Macular degeneration Neg Hx    Retinal detachment Neg Hx    Strabismus Neg Hx    Esophageal cancer Neg Hx    Colon cancer Neg Hx    Pancreatic cancer Neg Hx    Ulcerative colitis Neg Hx    Inflammatory bowel  disease Neg Hx    Liver disease Neg Hx    Rectal cancer Neg Hx    Stomach cancer Neg Hx     Social History   Socioeconomic History   Marital status: Single    Spouse name: Not on file   Number of children: 3   Years of education: Not on file   Highest education level: Not on file  Occupational History   Occupation: disability  Tobacco Use   Smoking status: Former    Packs/day: 0.25    Years: 25.00    Pack years: 6.25    Types: Cigarettes    Quit date: 04/26/2020    Years since quitting: 1.2    Smokeless tobacco: Never  Vaping Use   Vaping Use: Never used  Substance and Sexual Activity   Alcohol use: Yes    Alcohol/week: 4.0 standard drinks    Types: 4 Cans of beer per week    Comment: twice a week   Drug use: No   Sexual activity: Not Currently  Other Topics Concern   Not on file  Social History Narrative   Right handed   Lives alone in a one story home   Social Determinants of Health   Financial Resource Strain: Not on file  Food Insecurity: Not on file  Transportation Needs: No Transportation Needs   Lack of Transportation (Medical): No   Lack of Transportation (Non-Medical): No  Physical Activity: Insufficiently Active   Days of Exercise per Week: 7 days   Minutes of Exercise per Session: 10 min  Stress: Not on file  Social Connections: Not on file  Intimate Partner Violence: Not At Risk   Fear of Current or Ex-Partner: No   Emotionally Abused: No   Physically Abused: No   Sexually Abused: No    Outpatient Medications Prior to Visit  Medication Sig Dispense Refill   cyclobenzaprine (FLEXERIL) 10 MG tablet Take 1 tablet (10 mg total) by mouth 3 (three) times daily as needed for muscle spasms. 30 tablet 0   empagliflozin (JARDIANCE) 25 MG TABS tablet Take 1 tablet (25 mg total) by mouth daily. 90 tablet 3   esomeprazole (NEXIUM) 40 MG capsule TAKE ONE (1) CAPSULE BY MOUTH EVERY MORNING BEFORE BREAKFAST (Patient taking differently: Take 40 mg by mouth daily.) 90 capsule 1   finasteride (PROSCAR) 5 MG tablet Take 1 tablet (5 mg total) by mouth daily. 90 tablet 3   gabapentin (NEURONTIN) 300 MG capsule Take 1 capsule (300 mg total) by mouth 3 (three) times daily. 270 capsule 3   glucose blood (ACCU-CHEK AVIVA) test strip Use as instructed 100 each 12   ibuprofen (ADVIL) 200 MG tablet Take 800 mg by mouth every 6 (six) hours as needed for headache or mild pain.     linagliptin (TRADJENTA) 5 MG TABS tablet Take 1 tablet (5 mg total) by mouth daily. 90 tablet 3    lisinopril-hydrochlorothiazide (ZESTORETIC) 20-25 MG tablet Take 1 tablet by mouth daily. 90 tablet 3   metFORMIN (GLUCOPHAGE) 1000 MG tablet Take 1 tablet (1,000 mg total) by mouth 2 (two) times daily with a meal. TAKE 1 TABLET BY MOUTH 2 TIMES DAILY WITH A MEAL. 180 tablet 3   NUCYNTA 75 MG tablet SMARTSIG:1 Tablet(s) By Mouth 2-4 Times Daily     oxyCODONE (OXY IR/ROXICODONE) 5 MG immediate release tablet Take 5 mg by mouth every 6 (six) hours as needed.     simvastatin (ZOCOR) 10 MG tablet Take 1 tablet (10 mg  total) by mouth daily. 90 tablet 3   TRULICITY 1.5 0000000 SOPN Inject 1.5 mg into the skin every Monday. 6 mL 3   No facility-administered medications prior to visit.    Allergies  Allergen Reactions   Cymbalta [Duloxetine Hcl] Nausea And Vomiting and Other (See Comments)    Dizziness/headaches   Shrimp [Shellfish Allergy]     intolerant   Valium [Diazepam] Itching    ROS Review of Systems    Objective:    Physical Exam Constitutional:      Appearance: He is obese.  HENT:     Head: Normocephalic and atraumatic.     Nose: Nose normal.     Mouth/Throat:     Mouth: Mucous membranes are moist.  Cardiovascular:     Rate and Rhythm: Normal rate.     Pulses: Normal pulses.     Heart sounds: Normal heart sounds.  Pulmonary:     Effort: Pulmonary effort is normal.     Breath sounds: Normal breath sounds.  Abdominal:     General: Bowel sounds are normal.     Palpations: Abdomen is soft.  Musculoskeletal:        General: Normal range of motion.     Cervical back: Normal range of motion.  Skin:    General: Skin is warm and dry.     Capillary Refill: Capillary refill takes less than 2 seconds.  Neurological:     General: No focal deficit present.     Mental Status: He is alert.  Psychiatric:        Mood and Affect: Mood normal.        Behavior: Behavior normal.        Thought Content: Thought content normal.        Judgment: Judgment normal.    BP 140/74     Pulse 83    Temp 97.9 F (36.6 C)    Ht 5' 11.5" (1.816 m)    Wt 232 lb 8 oz (105.5 kg)    SpO2 98%    BMI 31.98 kg/m  Wt Readings from Last 3 Encounters:  07/08/21 232 lb 8 oz (105.5 kg)  06/14/21 230 lb (104.3 kg)  04/07/21 234 lb (106.1 kg)     Health Maintenance Due  Topic Date Due   OPHTHALMOLOGY EXAM  03/23/2019   COVID-19 Vaccine (3 - Moderna risk series) 06/01/2020    There are no preventive care reminders to display for this patient.  Lab Results  Component Value Date   TSH 2.164 07/05/2015   Lab Results  Component Value Date   WBC 8.8 03/26/2021   HGB 13.7 03/26/2021   HCT 41.6 03/26/2021   MCV 84.4 03/26/2021   PLT 320 03/26/2021   Lab Results  Component Value Date   NA 139 03/26/2021   K 3.8 03/26/2021   CO2 22 03/26/2021   GLUCOSE 133 (H) 03/26/2021   BUN 6 (L) 03/26/2021   CREATININE 0.75 03/26/2021   BILITOT 0.5 03/26/2021   ALKPHOS 98 03/26/2021   AST 28 03/26/2021   ALT 29 03/26/2021   PROT 7.0 03/26/2021   ALBUMIN 3.6 03/26/2021   CALCIUM 9.2 03/26/2021   ANIONGAP 12 03/26/2021   GFR 93.22 08/24/2020   Lab Results  Component Value Date   CHOL 133 07/26/2020   Lab Results  Component Value Date   HDL 63 07/26/2020   Lab Results  Component Value Date   LDLCALC 51 07/26/2020   Lab Results  Component Value Date  TRIG 105 07/26/2020   Lab Results  Component Value Date   CHOLHDL 2.1 07/26/2020   Lab Results  Component Value Date   HGBA1C 8.2 (H) 03/02/2021      Assessment & Plan:    Problem List Items Addressed This Visit       Endocrine   Diabetes ([place]) - Primary Worsening A1c has increased Encourage compliance with current treatment regimen  Dose adjustment none at this time  Encourage regular CBG monitoring Encourage contacting office if excessive hyperglycemia and or hypoglycemia Lifestyle modification with healthy diet (fewer calories, more high fiber foods, whole grains and non-starchy vegetables, lower fat meat  and fish, low-fat diary include healthy oils) regular exercise (physical activity) and weight loss Opthalmology exam discussed  Home BP monitoring also encouraged goal <130/80    Relevant Orders   HgB A1c   Comp. Metabolic Panel (12)   Other Visit Diagnoses     Hypertension due to endocrine disorder     Persistent  Encouraged on going compliance with current medication regimen Encouraged home monitoring and recording BP <130/80 Eating a heart-healthy diet with less salt Encouraged regular physical activity  Recommend Weight loss     Benign prostatic hyperplasia with urinary hesitancy     Persistent  Continue with current regimen.  No changes warranted. Good patient compliance.    Mixed hyperlipidemia       Relevant Orders   Lipid panel       No orders of the defined types were placed in this encounter.   Follow-up: No follow-ups on file.    Vevelyn Francois, NP

## 2021-07-09 LAB — COMP. METABOLIC PANEL (12)
AST: 31 IU/L (ref 0–40)
Albumin/Globulin Ratio: 1.6 (ref 1.2–2.2)
Albumin: 4.5 g/dL (ref 3.8–4.8)
Alkaline Phosphatase: 122 IU/L — ABNORMAL HIGH (ref 44–121)
BUN/Creatinine Ratio: 17 (ref 10–24)
BUN: 13 mg/dL (ref 8–27)
Bilirubin Total: 0.3 mg/dL (ref 0.0–1.2)
Calcium: 9.4 mg/dL (ref 8.6–10.2)
Chloride: 98 mmol/L (ref 96–106)
Creatinine, Ser: 0.78 mg/dL (ref 0.76–1.27)
Globulin, Total: 2.8 g/dL (ref 1.5–4.5)
Glucose: 103 mg/dL — ABNORMAL HIGH (ref 70–99)
Potassium: 4.3 mmol/L (ref 3.5–5.2)
Sodium: 138 mmol/L (ref 134–144)
Total Protein: 7.3 g/dL (ref 6.0–8.5)
eGFR: 100 mL/min/{1.73_m2} (ref >59)

## 2021-07-09 LAB — LIPID PANEL
Chol/HDL Ratio: 1.8 ratio (ref 0.0–5.0)
Cholesterol, Total: 107 mg/dL (ref 100–199)
HDL: 61 mg/dL (ref >39)
LDL Chol Calc (NIH): 31 mg/dL (ref 0–99)
Triglycerides: 75 mg/dL (ref 0–149)
VLDL Cholesterol Cal: 15 mg/dL (ref 5–40)

## 2021-07-11 ENCOUNTER — Ambulatory Visit (INDEPENDENT_AMBULATORY_CARE_PROVIDER_SITE_OTHER): Payer: Medicare Other | Admitting: Podiatry

## 2021-07-11 ENCOUNTER — Other Ambulatory Visit: Payer: Self-pay

## 2021-07-11 ENCOUNTER — Encounter: Payer: Self-pay | Admitting: Podiatry

## 2021-07-11 DIAGNOSIS — M722 Plantar fascial fibromatosis: Secondary | ICD-10-CM

## 2021-07-11 MED ORDER — TRIAMCINOLONE ACETONIDE 10 MG/ML IJ SUSP
20.0000 mg | Freq: Once | INTRAMUSCULAR | Status: AC
  Administered 2021-07-11: 20 mg

## 2021-07-11 NOTE — Progress Notes (Signed)
Subjective:   Patient ID: Riley Clarke, male   DOB: 66 y.o.   MRN: [id number]   HPI Patient states his heels have started to bother him again and he needs injection therapy   ROS      Objective:  Physical Exam  Neurovascular status intact discomfort still present fascial band bilateral medial band     Assessment:  Reoccurrence acute Planter fasciitis bilateral     Plan:  Sterile prep injected the plantar fascial bilateral 3 mg Kenalog 5 mg Xylocaine applied sterile dressing reappoint as symptoms indicate

## 2021-07-26 DIAGNOSIS — E1165 Type 2 diabetes mellitus with hyperglycemia: Secondary | ICD-10-CM | POA: Diagnosis not present

## 2021-07-26 DIAGNOSIS — Z9181 History of falling: Secondary | ICD-10-CM | POA: Diagnosis not present

## 2021-07-26 DIAGNOSIS — M5416 Radiculopathy, lumbar region: Secondary | ICD-10-CM | POA: Diagnosis not present

## 2021-07-26 DIAGNOSIS — I10 Essential (primary) hypertension: Secondary | ICD-10-CM | POA: Diagnosis not present

## 2021-07-26 DIAGNOSIS — Z79899 Other long term (current) drug therapy: Secondary | ICD-10-CM | POA: Diagnosis not present

## 2021-07-26 DIAGNOSIS — M542 Cervicalgia: Secondary | ICD-10-CM | POA: Diagnosis not present

## 2021-07-27 ENCOUNTER — Other Ambulatory Visit: Payer: Self-pay | Admitting: Neurosurgery

## 2021-07-27 DIAGNOSIS — M5412 Radiculopathy, cervical region: Secondary | ICD-10-CM

## 2021-07-27 DIAGNOSIS — M48062 Spinal stenosis, lumbar region with neurogenic claudication: Secondary | ICD-10-CM | POA: Diagnosis not present

## 2021-07-28 DIAGNOSIS — Z79899 Other long term (current) drug therapy: Secondary | ICD-10-CM | POA: Diagnosis not present

## 2021-08-09 DIAGNOSIS — M542 Cervicalgia: Secondary | ICD-10-CM | POA: Diagnosis not present

## 2021-08-09 DIAGNOSIS — I10 Essential (primary) hypertension: Secondary | ICD-10-CM | POA: Diagnosis not present

## 2021-08-09 DIAGNOSIS — M545 Low back pain, unspecified: Secondary | ICD-10-CM | POA: Diagnosis not present

## 2021-08-09 DIAGNOSIS — E1165 Type 2 diabetes mellitus with hyperglycemia: Secondary | ICD-10-CM | POA: Diagnosis not present

## 2021-08-09 DIAGNOSIS — Z79899 Other long term (current) drug therapy: Secondary | ICD-10-CM | POA: Diagnosis not present

## 2021-08-09 DIAGNOSIS — G8929 Other chronic pain: Secondary | ICD-10-CM | POA: Diagnosis not present

## 2021-08-09 DIAGNOSIS — Z9181 History of falling: Secondary | ICD-10-CM | POA: Diagnosis not present

## 2021-08-09 DIAGNOSIS — M5416 Radiculopathy, lumbar region: Secondary | ICD-10-CM | POA: Diagnosis not present

## 2021-08-10 ENCOUNTER — Ambulatory Visit
Admission: RE | Admit: 2021-08-10 | Discharge: 2021-08-10 | Disposition: A | Discharge status: Home / Self Care | Payer: Medicare Other | Source: Ambulatory Visit | Attending: Neurosurgery | Admitting: Neurosurgery

## 2021-08-10 ENCOUNTER — Other Ambulatory Visit: Payer: Self-pay

## 2021-08-10 DIAGNOSIS — M48062 Spinal stenosis, lumbar region with neurogenic claudication: Secondary | ICD-10-CM

## 2021-08-10 DIAGNOSIS — M5412 Radiculopathy, cervical region: Secondary | ICD-10-CM

## 2021-08-10 DIAGNOSIS — M4802 Spinal stenosis, cervical region: Secondary | ICD-10-CM | POA: Diagnosis not present

## 2021-08-10 DIAGNOSIS — M545 Low back pain, unspecified: Secondary | ICD-10-CM | POA: Diagnosis not present

## 2021-08-10 DIAGNOSIS — M48061 Spinal stenosis, lumbar region without neurogenic claudication: Secondary | ICD-10-CM | POA: Diagnosis not present

## 2021-08-10 DIAGNOSIS — M79605 Pain in left leg: Secondary | ICD-10-CM | POA: Diagnosis not present

## 2021-08-17 DIAGNOSIS — M5412 Radiculopathy, cervical region: Secondary | ICD-10-CM | POA: Diagnosis not present

## 2021-08-17 DIAGNOSIS — M48062 Spinal stenosis, lumbar region with neurogenic claudication: Secondary | ICD-10-CM | POA: Diagnosis not present

## 2021-08-23 DIAGNOSIS — M5416 Radiculopathy, lumbar region: Secondary | ICD-10-CM | POA: Diagnosis not present

## 2021-08-23 DIAGNOSIS — Z9181 History of falling: Secondary | ICD-10-CM | POA: Diagnosis not present

## 2021-08-23 DIAGNOSIS — G8929 Other chronic pain: Secondary | ICD-10-CM | POA: Diagnosis not present

## 2021-08-23 DIAGNOSIS — Z79899 Other long term (current) drug therapy: Secondary | ICD-10-CM | POA: Diagnosis not present

## 2021-08-23 DIAGNOSIS — M545 Low back pain, unspecified: Secondary | ICD-10-CM | POA: Diagnosis not present

## 2021-08-23 DIAGNOSIS — M542 Cervicalgia: Secondary | ICD-10-CM | POA: Diagnosis not present

## 2021-08-26 DIAGNOSIS — Z79899 Other long term (current) drug therapy: Secondary | ICD-10-CM | POA: Diagnosis not present

## 2021-08-29 DIAGNOSIS — R3914 Feeling of incomplete bladder emptying: Secondary | ICD-10-CM | POA: Diagnosis not present

## 2021-08-29 DIAGNOSIS — R3912 Poor urinary stream: Secondary | ICD-10-CM | POA: Diagnosis not present

## 2021-09-07 DIAGNOSIS — Z79899 Other long term (current) drug therapy: Secondary | ICD-10-CM | POA: Diagnosis not present

## 2021-09-07 DIAGNOSIS — Z9181 History of falling: Secondary | ICD-10-CM | POA: Diagnosis not present

## 2021-09-07 DIAGNOSIS — M545 Low back pain, unspecified: Secondary | ICD-10-CM | POA: Diagnosis not present

## 2021-09-07 DIAGNOSIS — M542 Cervicalgia: Secondary | ICD-10-CM | POA: Diagnosis not present

## 2021-09-07 DIAGNOSIS — E119 Type 2 diabetes mellitus without complications: Secondary | ICD-10-CM | POA: Diagnosis not present

## 2021-09-07 DIAGNOSIS — G8929 Other chronic pain: Secondary | ICD-10-CM | POA: Diagnosis not present

## 2021-10-05 ENCOUNTER — Encounter: Payer: Self-pay | Admitting: Podiatry

## 2021-10-05 ENCOUNTER — Ambulatory Visit (INDEPENDENT_AMBULATORY_CARE_PROVIDER_SITE_OTHER): Payer: Medicare Other | Admitting: Podiatry

## 2021-10-05 ENCOUNTER — Ambulatory Visit (INDEPENDENT_AMBULATORY_CARE_PROVIDER_SITE_OTHER): Payer: Medicare Other

## 2021-10-05 DIAGNOSIS — M722 Plantar fascial fibromatosis: Secondary | ICD-10-CM

## 2021-10-05 NOTE — Progress Notes (Signed)
Subjective:  ? ?Patient ID: Riley Clarke, male   DOB: 66 y.o.   MRN: [id number]  ? ?HPI ?Patient presents stating that he is developing a lot of pain in the bottom of both his heels and he did well for 3 months.  He does not want any surgery he knows that while we do not like to have to do these injections like this given the fact that it gives him 3 months of relief it is probably his best option and he wanted to rex-ray and see if there is been any other changes ? ? ?ROS ? ? ?   ?Objective:  ?Physical Exam  ?Neurovascular status intact exquisite discomfort plantar fascial band heel region bilateral that gets better for 3 months and then reoccurs ? ?   ?Assessment:  ?Acute plantar fasciitis bilateral that I do not think he would do well with surgery with his overall health history ? ?   ?Plan:  ?H&P x-rays reviewed sterile prep injected the fascial bilateral 3 mg Kenalog 5 g like applied sterile dressing reappoint in 3 to 4 months waiting as long as he can continue home stretching home treatment of this ? ?X-rays have not changed there is no signs of stress fracture arthritis and minimal spur has formed ?   ? ? ?

## 2021-10-06 DIAGNOSIS — Z9181 History of falling: Secondary | ICD-10-CM | POA: Diagnosis not present

## 2021-10-06 DIAGNOSIS — M5416 Radiculopathy, lumbar region: Secondary | ICD-10-CM | POA: Diagnosis not present

## 2021-10-06 DIAGNOSIS — G8929 Other chronic pain: Secondary | ICD-10-CM | POA: Diagnosis not present

## 2021-10-06 DIAGNOSIS — M542 Cervicalgia: Secondary | ICD-10-CM | POA: Diagnosis not present

## 2021-10-06 DIAGNOSIS — M545 Low back pain, unspecified: Secondary | ICD-10-CM | POA: Diagnosis not present

## 2021-10-06 DIAGNOSIS — Z79899 Other long term (current) drug therapy: Secondary | ICD-10-CM | POA: Diagnosis not present

## 2021-10-07 ENCOUNTER — Ambulatory Visit: Payer: Medicare Other | Admitting: Nurse Practitioner

## 2021-10-12 ENCOUNTER — Ambulatory Visit (INDEPENDENT_AMBULATORY_CARE_PROVIDER_SITE_OTHER): Payer: Medicare Other | Admitting: Nurse Practitioner

## 2021-10-12 ENCOUNTER — Encounter: Payer: Self-pay | Admitting: Nurse Practitioner

## 2021-10-12 VITALS — BP 141/66 | HR 93 | Temp 97.2°F | Ht 71.5 in | Wt 236.2 lb

## 2021-10-12 DIAGNOSIS — E119 Type 2 diabetes mellitus without complications: Secondary | ICD-10-CM | POA: Diagnosis not present

## 2021-10-12 DIAGNOSIS — I152 Hypertension secondary to endocrine disorders: Secondary | ICD-10-CM

## 2021-10-12 LAB — POCT GLYCOSYLATED HEMOGLOBIN (HGB A1C)
HbA1c POC (<> result, manual entry): 9.9 % (ref 4.0–5.6)
HbA1c, POC (controlled diabetic range): 9.9 % — AB (ref 0.0–7.0)
HbA1c, POC (prediabetic range): 9.9 % — AB (ref 5.7–6.4)
Hemoglobin A1C: 9.9 % — AB (ref 4.0–5.6)

## 2021-10-12 MED ORDER — TRULICITY 1.5 MG/0.5ML ~~LOC~~ SOAJ: 3.0000 mg | SUBCUTANEOUS | 3 refills | Status: DC

## 2021-10-12 MED ORDER — AMLODIPINE BESYLATE 10 MG PO TABS: 10.0000 mg | ORAL_TABLET | Freq: Every day | ORAL | 0 refills | Status: DC

## 2021-10-12 MED ORDER — LISINOPRIL-HYDROCHLOROTHIAZIDE 20-25 MG PO TABS: 1.0000 | ORAL_TABLET | Freq: Every day | ORAL | 3 refills | Status: DC

## 2021-10-12 NOTE — Patient Instructions (Signed)
You were seen today in the [facility] for reevaluation of chronic illness. Labs were collected, results will be available via MyChart or, if abnormal, you will be contacted by clinic staff. You were prescribed medications, please take as directed. Please follow up in 3 mths for reevaluation of HTN and DM. ?

## 2021-10-12 NOTE — Progress Notes (Signed)
? ?[facility] ?[address] ?[place], [place]  [address] ?Phone:  [phone]   Fax:  [phone] ?Subjective:  ? Patient ID: Riley Clarke, male    DOB: [birth year], 66 y.o.   MRN: [id number] ? ?Chief Complaint  ?Patient presents with  ? Follow-up  ?  Patient is here today for his 3 month follow up visit.  ? ?HPI ?Riley Clarke 66 y.o. male  has a past medical history of Arthritis, Chronic back pain, Diabetes mellitus without complication (HCC), GERD (gastroesophageal reflux disease), Headache(784.0), HLD (hyperlipidemia), Hypertension, and Vitamin D deficiency. To [facility] for reevaluation of HTN and DM2. ? ?Diabetes Mellitus: Patient presents for follow up of diabetes. Symptoms: none. Denies any symptoms. Patient denies none.  Evaluation to date has been included: hemoglobin A1C.  Home sugars: patient does not check sugars. Treatment to date: no recent interventions.  ? ?Hypertension: Patient here for follow-up of elevated blood pressure. He is not exercising and is not adherent to low salt diet.  Blood pressure is not well controlled at home. Cardiac symptoms none. Patient denies none.  Cardiovascular risk factors: advanced age (older than 7055 for men, 7165 for women), diabetes mellitus, and hypertension. Use of agents associated with hypertension: none. History of target organ damage: none. ? ?Checks B/P at home and its typically similar to today's value.  Denies any other concerns today.  ? ?Denies any fatigue, chest pain, shortness of breath, HA or dizziness. Denies any blurred vision, numbness or tingling. ? ?Past Medical History:  ?Diagnosis Date  ? Arthritis   ? Chronic back pain   ? on chronic opioids  ? Diabetes mellitus without complication (HCC)   ? GERD (gastroesophageal reflux disease)   ? Headache(784.0)   ? HLD (hyperlipidemia)   ? Hypertension   ? no meds  ? Vitamin D deficiency   ? ? ?Past Surgical History:  ?Procedure Laterality Date  ? CERVICAL DISC SURGERY  99  ?  LUMBAR FUSION    ? LUMBAR LAMINECTOMY/DECOMPRESSION MICRODISCECTOMY Bilateral 03/09/2021  ? Procedure: Laminectomy and Foraminotomy - bilateral - Lumbar two-Lumbar three;  Surgeon: Julio SicksPool, Henry, MD;  [facility];  Service: Neurosurgery;  Laterality: Bilateral;  ? ROTATOR CUFF REPAIR Bilateral   ? SHOULDER ARTHROSCOPY WITH ROTATOR CUFF REPAIR AND SUBACROMIAL DECOMPRESSION Right 12/30/2018  ? Procedure: SHOULDER ARTHROSCOPY WITH ROTATOR CUFF REPAIR AND SUBACROMIAL DECOMPRESSION, DISTAL CLAVICLE EXICISION,;  Surgeon: Bjorn PippinVarkey, Dax T, MD;  Location: [facility];  Service: Orthopedics;  Laterality: Right;  ? SHOULDER ARTHROSCOPY WITH SUBACROMIAL DECOMPRESSION, ROTATOR CUFF REPAIR AND BICEP TENDON REPAIR Left 06/19/2019  ? Procedure: LEFT SHOULDER ARTHROSCOPY, EXTENTSIVE DEBRIDEMENT, DISTAL CLAVICULECTOMY, SUBACROMIAL DECOMPRESSION, PARTIAL ACROMIOPLASTY WITH CORACOACROMIAL RELEASE, ROTATOR CUFFF REPAIR AND BICEP TENODESIS;  Surgeon: Bjorn PippinVarkey, Dax T, MD;  Location: [facility];  Service: Orthopedics;  Laterality: Left;  ? ? ?Family History  ?Problem Relation Age of Onset  ? Heart disease Mother   ? Diabetes Mother   ? Amblyopia Neg Hx   ? Blindness Neg Hx   ? Cataracts Neg Hx   ? Hypertension Neg Hx   ? Macular degeneration Neg Hx   ? Retinal detachment Neg Hx   ? Strabismus Neg Hx   ? Esophageal cancer Neg Hx   ? Colon cancer Neg Hx   ? Pancreatic cancer Neg Hx   ? Ulcerative colitis Neg Hx   ? Inflammatory bowel disease Neg Hx   ? Liver disease Neg Hx   ?  Rectal cancer Neg Hx   ? Stomach cancer Neg Hx   ? ? ?Social History  ? ?Socioeconomic History  ? Marital status: Single  ?  Spouse name: Not on file  ? Number of children: 3  ? Years of education: Not on file  ? Highest education level: Not on file  ?Occupational History  ? Occupation: disability  ?Tobacco Use  ? Smoking status: Former  ?  Packs/day: 0.25  ?  Years: 25.00  ?  Pack years: 6.25  ?  Types: Cigarettes  ?  Quit date: 04/26/2020  ?   Years since quitting: 1.4  ? Smokeless tobacco: Never  ?Vaping Use  ? Vaping Use: Never used  ?Substance and Sexual Activity  ? Alcohol use: Yes  ?  Alcohol/week: 4.0 standard drinks  ?  Types: 4 Cans of beer per week  ?  Comment: twice a week  ? Drug use: No  ? Sexual activity: Not Currently  ?Other Topics Concern  ? Not on file  ?Social History Narrative  ? Right handed  ? Lives alone in a one story home  ? ?Social Determinants of Health  ? ?Financial Resource Strain: Not on file  ?Food Insecurity: Not on file  ?Transportation Needs: No Transportation Needs  ? Lack of Transportation (Medical): No  ? Lack of Transportation (Non-Medical): No  ?Physical Activity: Insufficiently Active  ? Days of Exercise per Week: 7 days  ? Minutes of Exercise per Session: 10 min  ?Stress: Not on file  ?Social Connections: Not on file  ?Intimate Partner Violence: Not At Risk  ? Fear of Current or Ex-Partner: No  ? Emotionally Abused: No  ? Physically Abused: No  ? Sexually Abused: No  ? ? ?Outpatient Medications Prior to Visit  ?Medication Sig Dispense Refill  ? cyclobenzaprine (FLEXERIL) 10 MG tablet Take 1 tablet (10 mg total) by mouth 3 (three) times daily as needed for muscle spasms. 30 tablet 0  ? empagliflozin (JARDIANCE) 25 MG TABS tablet Take 1 tablet (25 mg total) by mouth daily. 90 tablet 3  ? esomeprazole (NEXIUM) 40 MG capsule TAKE ONE (1) CAPSULE BY MOUTH EVERY MORNING BEFORE BREAKFAST (Patient taking differently: Take 40 mg by mouth daily.) 90 capsule 1  ? finasteride (PROSCAR) 5 MG tablet Take 1 tablet (5 mg total) by mouth daily. 90 tablet 3  ? gabapentin (NEURONTIN) 300 MG capsule Take 1 capsule (300 mg total) by mouth 3 (three) times daily. 270 capsule 3  ? glucose blood (ACCU-CHEK AVIVA) test strip Use as instructed 100 each 12  ? ibuprofen (ADVIL) 200 MG tablet Take 800 mg by mouth every 6 (six) hours as needed for headache or mild pain.    ? linagliptin (TRADJENTA) 5 MG TABS tablet Take 1 tablet (5 mg total) by  mouth daily. 90 tablet 3  ? metFORMIN (GLUCOPHAGE) 1000 MG tablet Take 1 tablet (1,000 mg total) by mouth 2 (two) times daily with a meal. TAKE 1 TABLET BY MOUTH 2 TIMES DAILY WITH A MEAL. 180 tablet 3  ? NUCYNTA 75 MG tablet SMARTSIG:1 Tablet(s) By Mouth 2-4 Times Daily    ? simvastatin (ZOCOR) 10 MG tablet Take 1 tablet (10 mg total) by mouth daily. 90 tablet 3  ? lisinopril-hydrochlorothiazide (ZESTORETIC) 20-25 MG tablet Take 1 tablet by mouth daily. 90 tablet 3  ? TRULICITY 1.5 MG/0.5ML SOPN Inject 1.5 mg into the skin every Monday. 6 mL 3  ? oxyCODONE (OXY IR/ROXICODONE) 5 MG immediate release tablet Take 5  mg by mouth every 6 (six) hours as needed. (Patient not taking: Reported on 10/12/2021)    ? ?No facility-administered medications prior to visit.  ? ? ?Allergies  ?Allergen Reactions  ? Cymbalta [Duloxetine Hcl] Nausea And Vomiting and Other (See Comments)  ?  Dizziness/headaches  ? Shrimp [Shellfish Allergy]   ?  intolerant  ? Valium [Diazepam] Itching  ? ? ?Review of Systems  ?Constitutional:  Negative for chills, fever and malaise/fatigue.  ?Respiratory:  Negative for cough and shortness of breath.   ?Cardiovascular:  Negative for chest pain, palpitations and leg swelling.  ?Gastrointestinal:  Negative for abdominal pain, blood in stool, constipation, diarrhea, nausea and vomiting.  ?Skin: Negative.   ?Neurological: Negative.   ?Psychiatric/Behavioral:  Negative for depression. The patient is not nervous/anxious.   ?All other systems reviewed and are negative. ? ?   ?Objective:  ?  ?Physical Exam ?Constitutional:   ?   General: He is not in acute distress. ?   Appearance: Normal appearance. He is obese.  ?HENT:  ?   Head: Normocephalic.  ?Cardiovascular:  ?   Rate and Rhythm: Normal rate and regular rhythm.  ?   Pulses: Normal pulses.  ?   Heart sounds: Normal heart sounds.  ?   Comments: No obvious peripheral edema ?Pulmonary:  ?   Effort: Pulmonary effort is normal.  ?   Breath sounds: Normal breath  sounds.  ?Abdominal:  ?   General: Abdomen is flat. Bowel sounds are normal. There is no distension.  ?   Palpations: Abdomen is soft. There is no mass.  ?   Tenderness: There is no abdominal tenderness. The

## 2021-10-13 LAB — COMPREHENSIVE METABOLIC PANEL
ALT: 25 IU/L (ref 0–44)
AST: 27 IU/L (ref 0–40)
Albumin/Globulin Ratio: 1.7 (ref 1.2–2.2)
Albumin: 4.6 g/dL (ref 3.8–4.8)
Alkaline Phosphatase: 125 IU/L — ABNORMAL HIGH (ref 44–121)
BUN/Creatinine Ratio: 14 (ref 10–24)
BUN: 14 mg/dL (ref 8–27)
Bilirubin Total: 0.3 mg/dL (ref 0.0–1.2)
CO2: 21 mmol/L (ref 20–29)
Calcium: 9.5 mg/dL (ref 8.6–10.2)
Chloride: 97 mmol/L (ref 96–106)
Creatinine, Ser: 1 mg/dL (ref 0.76–1.27)
Globulin, Total: 2.7 g/dL (ref 1.5–4.5)
Glucose: 352 mg/dL — ABNORMAL HIGH (ref 70–99)
Potassium: 4 mmol/L (ref 3.5–5.2)
Sodium: 138 mmol/L (ref 134–144)
Total Protein: 7.3 g/dL (ref 6.0–8.5)
eGFR: 84 mL/min/{1.73_m2} (ref >59)

## 2021-10-14 ENCOUNTER — Other Ambulatory Visit: Payer: Self-pay | Admitting: Nurse Practitioner

## 2021-10-14 ENCOUNTER — Telehealth: Payer: Self-pay

## 2021-10-14 DIAGNOSIS — E119 Type 2 diabetes mellitus without complications: Secondary | ICD-10-CM

## 2021-10-14 DIAGNOSIS — Z79899 Other long term (current) drug therapy: Secondary | ICD-10-CM | POA: Diagnosis not present

## 2021-10-14 MED ORDER — TRULICITY 3 MG/0.5ML ~~LOC~~ SOAJ: 3.0000 mg | SUBCUTANEOUS | 0 refills | Status: AC

## 2021-10-18 NOTE — Telephone Encounter (Signed)
No additional notes needed from CMA ?

## 2021-10-18 NOTE — Telephone Encounter (Signed)
No additional notes needed by CMA ?

## 2021-10-25 ENCOUNTER — Telehealth: Payer: Self-pay

## 2021-10-26 NOTE — Telephone Encounter (Signed)
No additional notes needed  

## 2021-10-28 ENCOUNTER — Telehealth: Payer: Self-pay

## 2021-11-02 ENCOUNTER — Other Ambulatory Visit: Payer: Self-pay | Admitting: Nurse Practitioner

## 2021-11-02 DIAGNOSIS — E119 Type 2 diabetes mellitus without complications: Secondary | ICD-10-CM

## 2021-11-02 MED ORDER — FREESTYLE LIBRE 2 READER DEVI: 1.0000 | Freq: Three times a day (TID) | 2 refills | Status: DC

## 2021-11-03 NOTE — Telephone Encounter (Signed)
No additional notes needed  

## 2021-11-04 DIAGNOSIS — Z9181 History of falling: Secondary | ICD-10-CM | POA: Diagnosis not present

## 2021-11-04 DIAGNOSIS — Z79899 Other long term (current) drug therapy: Secondary | ICD-10-CM | POA: Diagnosis not present

## 2021-11-04 DIAGNOSIS — M5416 Radiculopathy, lumbar region: Secondary | ICD-10-CM | POA: Diagnosis not present

## 2021-11-04 DIAGNOSIS — M545 Low back pain, unspecified: Secondary | ICD-10-CM | POA: Diagnosis not present

## 2021-11-04 DIAGNOSIS — G8929 Other chronic pain: Secondary | ICD-10-CM | POA: Diagnosis not present

## 2021-11-04 DIAGNOSIS — M542 Cervicalgia: Secondary | ICD-10-CM | POA: Diagnosis not present

## 2021-11-08 ENCOUNTER — Other Ambulatory Visit: Payer: Self-pay | Admitting: Nurse Practitioner

## 2021-11-08 DIAGNOSIS — E119 Type 2 diabetes mellitus without complications: Secondary | ICD-10-CM

## 2021-11-08 MED ORDER — FREESTYLE LIBRE 2 READER DEVI: 1.0000 | Freq: Three times a day (TID) | 2 refills | Status: DC

## 2021-12-06 DIAGNOSIS — Z79899 Other long term (current) drug therapy: Secondary | ICD-10-CM | POA: Diagnosis not present

## 2021-12-06 DIAGNOSIS — M545 Low back pain, unspecified: Secondary | ICD-10-CM | POA: Diagnosis not present

## 2021-12-06 DIAGNOSIS — M542 Cervicalgia: Secondary | ICD-10-CM | POA: Diagnosis not present

## 2021-12-06 DIAGNOSIS — G8929 Other chronic pain: Secondary | ICD-10-CM | POA: Diagnosis not present

## 2021-12-06 DIAGNOSIS — M5136 Other intervertebral disc degeneration, lumbar region: Secondary | ICD-10-CM | POA: Diagnosis not present

## 2021-12-15 ENCOUNTER — Encounter: Payer: Self-pay | Admitting: Podiatry

## 2021-12-15 ENCOUNTER — Ambulatory Visit (INDEPENDENT_AMBULATORY_CARE_PROVIDER_SITE_OTHER): Payer: Medicare Other | Admitting: Podiatry

## 2021-12-15 DIAGNOSIS — M722 Plantar fascial fibromatosis: Secondary | ICD-10-CM

## 2021-12-15 DIAGNOSIS — E1165 Type 2 diabetes mellitus with hyperglycemia: Secondary | ICD-10-CM | POA: Diagnosis not present

## 2021-12-15 MED ORDER — TRIAMCINOLONE ACETONIDE 10 MG/ML IJ SUSP
20.0000 mg | Freq: Once | INTRAMUSCULAR | Status: AC
  Administered 2021-12-15: 20 mg

## 2021-12-16 DIAGNOSIS — Z9181 History of falling: Secondary | ICD-10-CM | POA: Diagnosis not present

## 2021-12-16 DIAGNOSIS — M545 Low back pain, unspecified: Secondary | ICD-10-CM | POA: Diagnosis not present

## 2021-12-16 DIAGNOSIS — G8929 Other chronic pain: Secondary | ICD-10-CM | POA: Diagnosis not present

## 2021-12-16 DIAGNOSIS — M542 Cervicalgia: Secondary | ICD-10-CM | POA: Diagnosis not present

## 2021-12-16 DIAGNOSIS — Z79899 Other long term (current) drug therapy: Secondary | ICD-10-CM | POA: Diagnosis not present

## 2021-12-16 DIAGNOSIS — I10 Essential (primary) hypertension: Secondary | ICD-10-CM | POA: Diagnosis not present

## 2021-12-16 DIAGNOSIS — R030 Elevated blood-pressure reading, without diagnosis of hypertension: Secondary | ICD-10-CM | POA: Diagnosis not present

## 2021-12-16 DIAGNOSIS — E119 Type 2 diabetes mellitus without complications: Secondary | ICD-10-CM | POA: Diagnosis not present

## 2021-12-16 DIAGNOSIS — M5416 Radiculopathy, lumbar region: Secondary | ICD-10-CM | POA: Diagnosis not present

## 2021-12-16 DIAGNOSIS — M5136 Other intervertebral disc degeneration, lumbar region: Secondary | ICD-10-CM | POA: Diagnosis not present

## 2021-12-16 NOTE — Progress Notes (Signed)
Subjective:   Patient ID: Riley Clarke, male   DOB: 66 y.o.   MRN: [id number]   HPI Patient states his heels have started to hurt him again and he needs them worked on and the injections make a great difference for close to 3 months   ROS      Objective:  Physical Exam  Neurovascular status intact with poor health individual not good surgical  candidate chronic Planter fasciitis bilateral with pain at the medial heel     Assessment:  Chronic Planter fasciitis bilateral handled with injections that I would like to be able to spread out further but he only gets so much relief     Plan:  H&P reviewed condition sterile prep and injected the plantar fascia at insertion bilateral 3 mg Kenalog 5 mg Xylocaine and applied sterile dressing.  We will get a go 3 months this time and hopefully we can keep him on the schedule

## 2021-12-21 DIAGNOSIS — M48062 Spinal stenosis, lumbar region with neurogenic claudication: Secondary | ICD-10-CM | POA: Diagnosis not present

## 2021-12-23 DIAGNOSIS — Z79899 Other long term (current) drug therapy: Secondary | ICD-10-CM | POA: Diagnosis not present

## 2022-01-10 ENCOUNTER — Other Ambulatory Visit: Payer: Self-pay | Admitting: Nurse Practitioner

## 2022-01-10 DIAGNOSIS — I152 Hypertension secondary to endocrine disorders: Secondary | ICD-10-CM

## 2022-01-12 ENCOUNTER — Ambulatory Visit (INDEPENDENT_AMBULATORY_CARE_PROVIDER_SITE_OTHER): Payer: Medicare Other | Admitting: Nurse Practitioner

## 2022-01-12 ENCOUNTER — Encounter: Payer: Self-pay | Admitting: Nurse Practitioner

## 2022-01-12 VITALS — BP 128/76 | HR 72 | Temp 97.5°F | Ht 71.5 in | Wt 227.0 lb

## 2022-01-12 DIAGNOSIS — J069 Acute upper respiratory infection, unspecified: Secondary | ICD-10-CM

## 2022-01-12 DIAGNOSIS — E119 Type 2 diabetes mellitus without complications: Secondary | ICD-10-CM

## 2022-01-12 LAB — POCT GLYCOSYLATED HEMOGLOBIN (HGB A1C)
HbA1c POC (<> result, manual entry): 7.2 % (ref 4.0–5.6)
HbA1c, POC (controlled diabetic range): 7.2 % — AB (ref 0.0–7.0)
HbA1c, POC (prediabetic range): 7.2 % — AB (ref 5.7–6.4)
Hemoglobin A1C: 7.2 % — AB (ref 4.0–5.6)

## 2022-01-12 MED ORDER — AZITHROMYCIN 250 MG PO TABS: ORAL_TABLET | ORAL | 0 refills | Status: AC

## 2022-01-12 MED ORDER — BENZONATATE 200 MG PO CAPS: 200.0000 mg | ORAL_CAPSULE | Freq: Two times a day (BID) | ORAL | 0 refills | Status: DC | PRN

## 2022-01-12 MED ORDER — CETIRIZINE HCL 10 MG PO TABS: 10.0000 mg | ORAL_TABLET | Freq: Every day | ORAL | 11 refills | Status: DC

## 2022-01-12 NOTE — Assessment & Plan Note (Signed)
-   POCT glycosylated hemoglobin (Hb A1C)    2. Upper respiratory tract infection, unspecified type  - cetirizine (ZYRTEC) 10 MG tablet; Take 1 tablet (10 mg total) by mouth daily.  Dispense: 30 tablet; Refill: 11 - benzonatate (TESSALON) 200 MG capsule; Take 1 capsule (200 mg total) by mouth 2 (two) times daily as needed for cough.  Dispense: 20 capsule; Refill: 0 - azithromycin (ZITHROMAX) 250 MG tablet; Take 2 tablets on day 1, then 1 tablet daily on days 2 through 5  Dispense: 6 tablet; Refill: 0   Follow up:  Follow up in 3 months or sooner if needed

## 2022-01-12 NOTE — Patient Instructions (Signed)
1. Type 2 diabetes mellitus without complication, without long-term current use of insulin (HCC)  - POCT glycosylated hemoglobin (Hb A1C)    2. Upper respiratory tract infection, unspecified type  - cetirizine (ZYRTEC) 10 MG tablet; Take 1 tablet (10 mg total) by mouth daily.  Dispense: 30 tablet; Refill: 11 - benzonatate (TESSALON) 200 MG capsule; Take 1 capsule (200 mg total) by mouth 2 (two) times daily as needed for cough.  Dispense: 20 capsule; Refill: 0 - azithromycin (ZITHROMAX) 250 MG tablet; Take 2 tablets on day 1, then 1 tablet daily on days 2 through 5  Dispense: 6 tablet; Refill: 0   Follow up:  Follow up in 3 months or sooner if needed

## 2022-01-12 NOTE — Progress Notes (Signed)
@Patient  ID: , male    DOB: 1956/02/23, 66 y.o.   MRN: [id number]  Chief Complaint  Patient presents with   Follow-up    Pt is here for 3 month's DM and HTN. Pt states he had a dry cough with green phlegm  for the past 2 week's     Referring provider: No ref. provider found  HPI  Elias Dennington Novitski 66 y.o. male  has a past medical history of Arthritis, Chronic back pain, Diabetes mellitus without complication (HCC), GERD (gastroesophageal reflux disease), Headache(784.0), HLD (hyperlipidemia), Hypertension, and Vitamin D deficiency. To the [facility] for reevaluation of HTN and DM2.   Diabetes Mellitus: Patient presents for follow up of diabetes. Symptoms: none. Denies any symptoms. Patient denies none.  Evaluation to date has been included: hemoglobin A1C.  Home sugars: patient does not check sugars. Treatment to date: no recent interventions. blood sugars ranging around 150. States that he is working on diet. A1C down to 7.2 today.    Hypertension: Patient here for follow-up of elevated blood pressure. He is not exercising and is not adherent to low salt diet.  Blood pressure is not well controlled at home. Cardiac symptoms none. Patient denies none.  Cardiovascular risk factors: advanced age (older than 73 for men, 61 for women), diabetes mellitus, and hypertension. Use of agents associated with hypertension: none. History of target organ damage: none.   Checks B/P at home and its typically similar to today's value.  Denies any other concerns today.    Reports cough x 2 weeks productive at times. Feels a lot of mucus in throat.    Denies any fatigue, chest pain, shortness of breath, HA or dizziness. Denies any blurred vision, numbness or tingling.    Allergies  Allergen Reactions   Cymbalta [Duloxetine Hcl] Nausea And Vomiting and Other (See Comments)    Dizziness/headaches   Shrimp [Shellfish Allergy]     intolerant   Valium [Diazepam] Itching    Immunization History   Administered Date(s) Administered   Influenza,inj,Quad PF,6+ Mos 04/01/2015, 02/04/2016, 02/19/2017, 04/02/2018, 02/02/2021   Moderna SARS-COV2 Booster Vaccination 05/04/2020   Moderna Sars-Covid-2 Vaccination 08/27/2019, 09/24/2019   Pneumococcal Conjugate-13 04/01/2015   Pneumococcal Polysaccharide-23 05/08/2016   Tdap 09/06/2014, 04/01/2015    Past Medical History:  Diagnosis Date   Arthritis    Chronic back pain    on chronic opioids   Diabetes mellitus without complication (HCC)    GERD (gastroesophageal reflux disease)    Headache(784.0)    HLD (hyperlipidemia)    Hypertension    no meds   Vitamin D deficiency     Tobacco History: Social History   Tobacco Use  Smoking Status Former   Packs/day: 0.25   Years: 25.00   Total pack years: 6.25   Types: Cigarettes   Quit date: 04/26/2020   Years since quitting: 1.7  Smokeless Tobacco Never   Counseling given: Not Answered   Outpatient Encounter Medications as of 01/12/2022  Medication Sig   azithromycin (ZITHROMAX) 250 MG tablet Take 2 tablets on day 1, then 1 tablet daily on days 2 through 5   benzonatate (TESSALON) 200 MG capsule Take 1 capsule (200 mg total) by mouth 2 (two) times daily as needed for cough.   cetirizine (ZYRTEC) 10 MG tablet Take 1 tablet (10 mg total) by mouth daily.   Continuous Blood Gluc Receiver (FREESTYLE LIBRE 2 READER) DEVI 1 each by Does not apply route in the morning, at noon, and at bedtime.  Dulaglutide (TRULICITY) 3 0000000 SOPN Inject 3 mg as directed once a week.   esomeprazole (NEXIUM) 40 MG capsule TAKE ONE (1) CAPSULE BY MOUTH EVERY MORNING BEFORE BREAKFAST (Patient taking differently: Take 40 mg by mouth daily.)   glucose blood (ACCU-CHEK AVIVA) test strip Use as instructed   lisinopril-hydrochlorothiazide (ZESTORETIC) 20-25 MG tablet Take 1 tablet by mouth daily.   meloxicam (MOBIC) 15 MG tablet Take 15 mg by mouth daily.   simvastatin (ZOCOR) 10 MG tablet Take 1 tablet (10  mg total) by mouth daily.   amLODipine (NORVASC) 10 MG tablet TAKE 1 TABLET(10 MG) BY MOUTH DAILY (Patient not taking: Reported on 01/12/2022)   cyclobenzaprine (FLEXERIL) 10 MG tablet Take 1 tablet (10 mg total) by mouth 3 (three) times daily as needed for muscle spasms. (Patient not taking: Reported on 01/12/2022)   finasteride (PROSCAR) 5 MG tablet Take 1 tablet (5 mg total) by mouth daily. (Patient not taking: Reported on 01/12/2022)   gabapentin (NEURONTIN) 300 MG capsule Take 1 capsule (300 mg total) by mouth 3 (three) times daily.   ibuprofen (ADVIL) 200 MG tablet Take 800 mg by mouth every 6 (six) hours as needed for headache or mild pain. (Patient not taking: Reported on 01/12/2022)   linagliptin (TRADJENTA) 5 MG TABS tablet Take 1 tablet (5 mg total) by mouth daily.   metFORMIN (GLUCOPHAGE) 1000 MG tablet Take 1 tablet (1,000 mg total) by mouth 2 (two) times daily with a meal. TAKE 1 TABLET BY MOUTH 2 TIMES DAILY WITH A MEAL.   NUCYNTA 75 MG tablet SMARTSIG:1 Tablet(s) By Mouth 2-4 Times Daily   oxyCODONE (OXY IR/ROXICODONE) 5 MG immediate release tablet Take 5 mg by mouth every 6 (six) hours as needed. (Patient not taking: Reported on 10/12/2021)   No facility-administered encounter medications on file as of 01/12/2022.     Review of Systems  Review of Systems  Constitutional: Negative.   HENT:  Positive for postnasal drip.   Respiratory:  Positive for cough.   Cardiovascular: Negative.   Gastrointestinal: Negative.   Allergic/Immunologic: Negative.   Neurological: Negative.   Psychiatric/Behavioral: Negative.         Physical Exam  BP 128/76 (BP Location: Right Arm, Patient Position: Sitting, Cuff Size: Large)   Pulse 72   Temp (!) 97.5 F (36.4 C)   Ht 5' 11.5" (1.816 m)   Wt 227 lb (103 kg)   SpO2 100%   BMI 31.22 kg/m   Wt Readings from Last 5 Encounters:  01/12/22 227 lb (103 kg)  10/12/21 236 lb 3.2 oz (107.1 kg)  07/08/21 232 lb 8 oz (105.5 kg)  06/14/21 230 lb  (104.3 kg)  04/07/21 234 lb (106.1 kg)     Physical Exam Vitals and nursing note reviewed.  Constitutional:      General: He is not in acute distress.    Appearance: He is well-developed.  Cardiovascular:     Rate and Rhythm: Normal rate and regular rhythm.  Pulmonary:     Effort: Pulmonary effort is normal.     Breath sounds: Normal breath sounds.  Skin:    General: Skin is warm and dry.  Neurological:     Mental Status: He is alert and oriented to person, place, and time.      Lab Results:  CBC    Component Value Date/Time   WBC 8.8 03/26/2021 0925   RBC 4.93 03/26/2021 0925   HGB 13.7 03/26/2021 0925   HGB 14.7 07/25/2017 1537  HCT 41.6 03/26/2021 0925   HCT 43.8 07/25/2017 1537   PLT 320 03/26/2021 0925   PLT 153 07/25/2017 1537   MCV 84.4 03/26/2021 0925   MCV 90 07/25/2017 1537   MCH 27.8 03/26/2021 0925   MCHC 32.9 03/26/2021 0925   RDW 13.6 03/26/2021 0925   RDW 13.8 07/25/2017 1537   LYMPHSABS 2.0 03/26/2021 0925   LYMPHSABS 1.4 07/25/2017 1537   MONOABS 0.7 03/26/2021 0925   EOSABS 0.2 03/26/2021 0925   EOSABS 0.2 07/25/2017 1537   BASOSABS 0.1 03/26/2021 0925   BASOSABS 0.0 07/25/2017 1537    BMET    Component Value Date/Time   NA 138 10/12/2021 1505   K 4.0 10/12/2021 1505   CL 97 10/12/2021 1505   CO2 21 10/12/2021 1505   GLUCOSE 352 (H) 10/12/2021 1505   GLUCOSE 133 (H) 03/26/2021 0925   BUN 14 10/12/2021 1505   CREATININE 1.00 10/12/2021 1505   CREATININE 0.86 01/15/2017 1501   CALCIUM 9.5 10/12/2021 1505   GFRNONAA >60 03/26/2021 0925   GFRNONAA >89 01/15/2017 1501   GFRAA 105 07/26/2020 1117   GFRAA >89 01/15/2017 1501    BNP No results found for: "BNP"  ProBNP No results found for: "PROBNP"  Imaging: No results found.   Assessment & Plan:   Diabetes - POCT glycosylated hemoglobin (Hb A1C)    2. Upper respiratory tract infection, unspecified type  - cetirizine (ZYRTEC) 10 MG tablet; Take 1 tablet (10 mg total)  by mouth daily.  Dispense: 30 tablet; Refill: 11 - benzonatate (TESSALON) 200 MG capsule; Take 1 capsule (200 mg total) by mouth 2 (two) times daily as needed for cough.  Dispense: 20 capsule; Refill: 0 - azithromycin (ZITHROMAX) 250 MG tablet; Take 2 tablets on day 1, then 1 tablet daily on days 2 through 5  Dispense: 6 tablet; Refill: 0   Follow up:  Follow up in 3 months or sooner if needed     Ivonne Andrew, NP 01/12/2022

## 2022-01-15 DIAGNOSIS — E1165 Type 2 diabetes mellitus with hyperglycemia: Secondary | ICD-10-CM | POA: Diagnosis not present

## 2022-01-16 DIAGNOSIS — G8929 Other chronic pain: Secondary | ICD-10-CM | POA: Diagnosis not present

## 2022-01-16 DIAGNOSIS — Z79899 Other long term (current) drug therapy: Secondary | ICD-10-CM | POA: Diagnosis not present

## 2022-01-16 DIAGNOSIS — R892 Abnormal level of other drugs, medicaments and biological substances in specimens from other organs, systems and tissues: Secondary | ICD-10-CM | POA: Diagnosis not present

## 2022-01-16 DIAGNOSIS — M545 Low back pain, unspecified: Secondary | ICD-10-CM | POA: Diagnosis not present

## 2022-01-16 DIAGNOSIS — M961 Postlaminectomy syndrome, not elsewhere classified: Secondary | ICD-10-CM | POA: Diagnosis not present

## 2022-01-18 DIAGNOSIS — Z79899 Other long term (current) drug therapy: Secondary | ICD-10-CM | POA: Diagnosis not present

## 2022-02-14 DIAGNOSIS — R892 Abnormal level of other drugs, medicaments and biological substances in specimens from other organs, systems and tissues: Secondary | ICD-10-CM | POA: Diagnosis not present

## 2022-02-14 DIAGNOSIS — G8929 Other chronic pain: Secondary | ICD-10-CM | POA: Diagnosis not present

## 2022-02-14 DIAGNOSIS — M545 Low back pain, unspecified: Secondary | ICD-10-CM | POA: Diagnosis not present

## 2022-02-14 DIAGNOSIS — Z79899 Other long term (current) drug therapy: Secondary | ICD-10-CM | POA: Diagnosis not present

## 2022-02-14 DIAGNOSIS — M961 Postlaminectomy syndrome, not elsewhere classified: Secondary | ICD-10-CM | POA: Diagnosis not present

## 2022-02-15 DIAGNOSIS — E1165 Type 2 diabetes mellitus with hyperglycemia: Secondary | ICD-10-CM | POA: Diagnosis not present

## 2022-03-15 ENCOUNTER — Other Ambulatory Visit: Payer: Self-pay | Admitting: Nurse Practitioner

## 2022-03-15 DIAGNOSIS — G8929 Other chronic pain: Secondary | ICD-10-CM | POA: Diagnosis not present

## 2022-03-15 DIAGNOSIS — Z79899 Other long term (current) drug therapy: Secondary | ICD-10-CM | POA: Diagnosis not present

## 2022-03-15 DIAGNOSIS — M545 Low back pain, unspecified: Secondary | ICD-10-CM | POA: Diagnosis not present

## 2022-03-15 DIAGNOSIS — M961 Postlaminectomy syndrome, not elsewhere classified: Secondary | ICD-10-CM | POA: Diagnosis not present

## 2022-03-15 DIAGNOSIS — R892 Abnormal level of other drugs, medicaments and biological substances in specimens from other organs, systems and tissues: Secondary | ICD-10-CM | POA: Diagnosis not present

## 2022-03-16 ENCOUNTER — Other Ambulatory Visit: Payer: Self-pay | Admitting: Nurse Practitioner

## 2022-03-20 DIAGNOSIS — E1165 Type 2 diabetes mellitus with hyperglycemia: Secondary | ICD-10-CM | POA: Diagnosis not present

## 2022-03-23 ENCOUNTER — Ambulatory Visit (INDEPENDENT_AMBULATORY_CARE_PROVIDER_SITE_OTHER): Payer: Medicare Other | Admitting: Podiatry

## 2022-03-23 ENCOUNTER — Encounter: Payer: Self-pay | Admitting: Podiatry

## 2022-03-23 DIAGNOSIS — M722 Plantar fascial fibromatosis: Secondary | ICD-10-CM | POA: Diagnosis not present

## 2022-03-23 MED ORDER — TRIAMCINOLONE ACETONIDE 10 MG/ML IJ SUSP
20.0000 mg | Freq: Once | INTRAMUSCULAR | Status: AC
  Administered 2022-03-23: 20 mg

## 2022-03-26 NOTE — Progress Notes (Signed)
Subjective:   Patient ID: Riley Clarke, male   DOB: 66 y.o.   MRN: [id number]   HPI States my heels have been really hurting me and I do so well with injections want to continue the same treatment process   ROS      Objective:  Physical Exam  Neurovascular status intact exquisite discomfort medial fascial band heel region bilateral     Assessment:  Acute plantar fasciitis bilateral that does well with injection treatment not good surgical candidate     Plan:  Sterile prep injected the plantar fascia bilateral 3 mg Kenalog 5 mg Xylocaine applied sterile dressing reappoint for routine treatments as needed

## 2022-04-03 ENCOUNTER — Other Ambulatory Visit: Payer: Self-pay | Admitting: Nurse Practitioner

## 2022-04-11 ENCOUNTER — Other Ambulatory Visit: Payer: Self-pay | Admitting: Nurse Practitioner

## 2022-04-12 ENCOUNTER — Telehealth: Payer: Self-pay

## 2022-04-12 ENCOUNTER — Telehealth: Payer: Self-pay | Admitting: Nurse Practitioner

## 2022-04-12 NOTE — Telephone Encounter (Signed)
Called pt and med has been filled. Riley Clarke

## 2022-04-12 NOTE — Telephone Encounter (Signed)
Trulicity refill request

## 2022-04-12 NOTE — Telephone Encounter (Signed)
Error

## 2022-04-13 DIAGNOSIS — M961 Postlaminectomy syndrome, not elsewhere classified: Secondary | ICD-10-CM | POA: Diagnosis not present

## 2022-04-13 DIAGNOSIS — M25512 Pain in left shoulder: Secondary | ICD-10-CM | POA: Diagnosis not present

## 2022-04-13 DIAGNOSIS — M545 Low back pain, unspecified: Secondary | ICD-10-CM | POA: Diagnosis not present

## 2022-04-13 DIAGNOSIS — R892 Abnormal level of other drugs, medicaments and biological substances in specimens from other organs, systems and tissues: Secondary | ICD-10-CM | POA: Diagnosis not present

## 2022-04-13 DIAGNOSIS — Z79899 Other long term (current) drug therapy: Secondary | ICD-10-CM | POA: Diagnosis not present

## 2022-04-13 DIAGNOSIS — G8929 Other chronic pain: Secondary | ICD-10-CM | POA: Diagnosis not present

## 2022-04-14 ENCOUNTER — Ambulatory Visit: Payer: Medicare Other | Admitting: Nurse Practitioner

## 2022-04-18 DIAGNOSIS — E119 Type 2 diabetes mellitus without complications: Secondary | ICD-10-CM | POA: Diagnosis not present

## 2022-04-18 DIAGNOSIS — Z1211 Encounter for screening for malignant neoplasm of colon: Secondary | ICD-10-CM | POA: Diagnosis not present

## 2022-04-18 DIAGNOSIS — I10 Essential (primary) hypertension: Secondary | ICD-10-CM | POA: Diagnosis not present

## 2022-04-20 DIAGNOSIS — E1165 Type 2 diabetes mellitus with hyperglycemia: Secondary | ICD-10-CM | POA: Diagnosis not present

## 2022-04-26 DIAGNOSIS — M48062 Spinal stenosis, lumbar region with neurogenic claudication: Secondary | ICD-10-CM | POA: Diagnosis not present

## 2022-05-03 ENCOUNTER — Other Ambulatory Visit: Payer: Self-pay | Admitting: Nurse Practitioner

## 2022-05-10 ENCOUNTER — Ambulatory Visit: Payer: Self-pay | Admitting: Nurse Practitioner

## 2022-05-11 DIAGNOSIS — Z79899 Other long term (current) drug therapy: Secondary | ICD-10-CM | POA: Diagnosis not present

## 2022-05-11 DIAGNOSIS — R892 Abnormal level of other drugs, medicaments and biological substances in specimens from other organs, systems and tissues: Secondary | ICD-10-CM | POA: Diagnosis not present

## 2022-05-11 DIAGNOSIS — G8929 Other chronic pain: Secondary | ICD-10-CM | POA: Diagnosis not present

## 2022-05-11 DIAGNOSIS — M961 Postlaminectomy syndrome, not elsewhere classified: Secondary | ICD-10-CM | POA: Diagnosis not present

## 2022-05-11 DIAGNOSIS — M545 Low back pain, unspecified: Secondary | ICD-10-CM | POA: Diagnosis not present

## 2022-05-21 DIAGNOSIS — E1165 Type 2 diabetes mellitus with hyperglycemia: Secondary | ICD-10-CM | POA: Diagnosis not present

## 2022-06-01 ENCOUNTER — Other Ambulatory Visit: Payer: Self-pay | Admitting: Nurse Practitioner

## 2022-06-07 ENCOUNTER — Telehealth: Payer: Self-pay | Admitting: Nurse Practitioner

## 2022-06-07 NOTE — Telephone Encounter (Signed)
No answer unable to leave a message for patient to call back and schedule Medicare Annual Wellness Visit (AWV).   Please offer to do virtually or by telephone.   Last AWV:  02/24/2021   Schedule at any time with [facility] Nurse Health Advisor   30 minute appointment for Virtual or phone  45 minute appointment for Initial virtual/phone  Any questions, please contact me at [phone]   Thank you,   [facility]  Ambulatory Clinical Support for Care Management [facility] You Are. We Are. [facility] ??[phone] or ??[phone]

## 2022-06-08 DIAGNOSIS — R892 Abnormal level of other drugs, medicaments and biological substances in specimens from other organs, systems and tissues: Secondary | ICD-10-CM | POA: Diagnosis not present

## 2022-06-08 DIAGNOSIS — M961 Postlaminectomy syndrome, not elsewhere classified: Secondary | ICD-10-CM | POA: Diagnosis not present

## 2022-06-08 DIAGNOSIS — M545 Low back pain, unspecified: Secondary | ICD-10-CM | POA: Diagnosis not present

## 2022-06-08 DIAGNOSIS — Z79899 Other long term (current) drug therapy: Secondary | ICD-10-CM | POA: Diagnosis not present

## 2022-06-08 DIAGNOSIS — G8929 Other chronic pain: Secondary | ICD-10-CM | POA: Diagnosis not present

## 2022-06-23 ENCOUNTER — Other Ambulatory Visit: Payer: Self-pay | Admitting: Nurse Practitioner

## 2022-06-23 ENCOUNTER — Telehealth: Payer: Self-pay | Admitting: Nurse Practitioner

## 2022-06-23 MED ORDER — TRULICITY 1.5 MG/0.5ML ~~LOC~~ SOAJ: SUBCUTANEOUS | 3 refills | Status: DC

## 2022-06-23 NOTE — Telephone Encounter (Signed)
Caller & Relationship to patient:  MRN #  [id number]   Call Back Number:   Date of Last Office Visit: 06/07/2022     Date of Next Office Visit: 06/28/2022    Medication(s) to be Refilled: Trulicity   Preferred Pharmacy:   ** Please notify patient to allow 48-72 hours to process** **Let patient know to contact pharmacy at the end of the day to make sure medication is ready. ** **If patient has not been seen in a year or longer, book an appointment **Advise to use MyChart for refill requests OR to contact their pharmacy

## 2022-06-28 ENCOUNTER — Encounter: Payer: Self-pay | Admitting: Nurse Practitioner

## 2022-06-28 ENCOUNTER — Ambulatory Visit (INDEPENDENT_AMBULATORY_CARE_PROVIDER_SITE_OTHER): Payer: 59 | Admitting: Nurse Practitioner

## 2022-06-28 VITALS — BP 132/67 | HR 80 | Temp 98.2°F | Ht 72.0 in | Wt 225.0 lb

## 2022-06-28 DIAGNOSIS — Z13220 Encounter for screening for lipoid disorders: Secondary | ICD-10-CM

## 2022-06-28 DIAGNOSIS — E119 Type 2 diabetes mellitus without complications: Secondary | ICD-10-CM | POA: Diagnosis not present

## 2022-06-28 LAB — POCT GLYCOSYLATED HEMOGLOBIN (HGB A1C): Hemoglobin A1C: 7.5 % — AB (ref 4.0–5.6)

## 2022-06-28 NOTE — Progress Notes (Signed)
Subjective:    Patient ID: Riley Clarke, male    DOB: 26-Jan-1956, 67 y.o.   MRN: [id number]  Riley Clarke is a 67 y.o. male who presents for follow-up of Type 2 diabetes mellitus.  Patient is checking home blood sugars.   Home blood sugar records: not checking Current symptoms/problems include none and have been stable. Last eye exam: unknown Exercise: The patient does not participate in regular exercise at present.  The following portions of the patient's history were reviewed and updated as appropriate: allergies, current medications, past medical history, past social history and problem list.      Review of Systems  Constitutional: Negative.   HENT: Negative.    Eyes: Negative.   Respiratory: Negative.    Cardiovascular: Negative.   Gastrointestinal: Negative.   Genitourinary: Negative.   Musculoskeletal: Negative.   Skin: Negative.   Neurological: Negative.   Endo/Heme/Allergies: Negative.   Psychiatric/Behavioral: Negative.          Objective:    Physical Exam Constitutional:      General: He is not in acute distress. Cardiovascular:     Rate and Rhythm: Normal rate and regular rhythm.  Pulmonary:     Effort: Pulmonary effort is normal.     Breath sounds: Normal breath sounds.  Skin:    General: Skin is warm and dry.  Neurological:     Mental Status: He is alert and oriented to person, place, and time.  Psychiatric:        Mood and Affect: Affect normal.      Blood pressure 132/67, pulse 80, temperature 98.2 F (36.8 C), temperature source Oral, height 6' (1.829 m), weight 225 lb (102.1 kg), SpO2 98 %.  Lab Review    Latest Ref Rng & Units 06/28/2022    2:31 PM 01/12/2022    2:44 PM 10/12/2021    3:18 PM 10/12/2021    3:05 PM 07/08/2021    9:47 PM  Diabetic Labs  HbA1c 4.0 - 5.6 % 7.5  7.2    7.2    7.2    7.2  9.9    9.9    9.9    9.9   9.7   Creatinine 0.76 - 1.27 mg/dL    1.00        06/28/2022    2:23 PM 01/12/2022    2:34 PM  10/12/2021    2:26 PM 07/08/2021    1:52 PM 06/14/2021   11:46 AM  BP/Weight  Systolic BP 063 016 010 932   Diastolic BP 67 76 66 74   Wt. (Lbs) 225 227 236.2 232.5 230  BMI 30.52 kg/m2 31.22 kg/m2 32.48 kg/m2 31.98 kg/m2 30.34 kg/m2      Latest Ref Rng & Units 07/09/2019   12:00 AM 10/14/2015    1:30 PM  Foot/eye exam completion dates  Eye Exam No Retinopathy Retinopathy       Foot Form Completion   Done     This result is from an external source.    Riley Clarke  reports that he quit smoking about 2 years ago. His smoking use included cigarettes. He has a 6.25 pack-year smoking history. He has never used smokeless tobacco. He reports current alcohol use of about 4.0 standard drinks of alcohol per week. He reports that he does not use drugs.     Assessment & Plan:    Type 2 diabetes mellitus without complication, without long-term current use of insulin (HCC) - Plan: Microalbumin/Creatinine Ratio, Urine, POCT  glycosylated hemoglobin (Hb A1C), Ambulatory referral to Ophthalmology, CBC, Comprehensive metabolic panel, Lipid Panel  Lipid screening - Plan: Lipid Panel  Rx changes: none Education: Reviewed 'ABCs' of diabetes management (respective goals in parentheses):  A1C (<7), blood pressure (<130/80), and cholesterol (LDL <100). Compliance at present is estimated to be excellent. Efforts to improve compliance (if necessary) will be directed at increased exercise. Follow up: 3 months  Patient Instructions  1. Type 2 diabetes mellitus without complication, without long-term current use of insulin (HCC)  - Microalbumin/Creatinine Ratio, Urine - POCT glycosylated hemoglobin (Hb A1C)   Follow up:  Follow up in 3 month     Lazaro Arms, [facility]  06/28/22

## 2022-06-28 NOTE — Patient Instructions (Addendum)
1. Type 2 diabetes mellitus without complication, without long-term current use of insulin (HCC)  - Microalbumin/Creatinine Ratio, Urine - POCT glycosylated hemoglobin (Hb A1C)   Follow up:  Follow up in 3 month

## 2022-06-29 ENCOUNTER — Ambulatory Visit (INDEPENDENT_AMBULATORY_CARE_PROVIDER_SITE_OTHER): Payer: 59 | Admitting: Podiatry

## 2022-06-29 ENCOUNTER — Encounter: Payer: Self-pay | Admitting: Podiatry

## 2022-06-29 DIAGNOSIS — M722 Plantar fascial fibromatosis: Secondary | ICD-10-CM

## 2022-06-29 LAB — COMPREHENSIVE METABOLIC PANEL
ALT: 13 IU/L (ref 0–44)
AST: 19 IU/L (ref 0–40)
Albumin/Globulin Ratio: 1.5 (ref 1.2–2.2)
Albumin: 4.2 g/dL (ref 3.9–4.9)
Alkaline Phosphatase: 106 IU/L (ref 44–121)
BUN/Creatinine Ratio: 12 (ref 10–24)
BUN: 11 mg/dL (ref 8–27)
Bilirubin Total: <0.2 mg/dL (ref 0.0–1.2)
CO2: 22 mmol/L (ref 20–29)
Calcium: 9.2 mg/dL (ref 8.6–10.2)
Chloride: 103 mmol/L (ref 96–106)
Creatinine, Ser: 0.92 mg/dL (ref 0.76–1.27)
Globulin, Total: 2.8 g/dL (ref 1.5–4.5)
Glucose: 105 mg/dL — ABNORMAL HIGH (ref 70–99)
Potassium: 3.8 mmol/L (ref 3.5–5.2)
Sodium: 138 mmol/L (ref 134–144)
Total Protein: 7 g/dL (ref 6.0–8.5)
eGFR: 93 mL/min/{1.73_m2} (ref >59)

## 2022-06-29 LAB — CBC
Hematocrit: 38 % (ref 37.5–51.0)
Hemoglobin: 12.6 g/dL — ABNORMAL LOW (ref 13.0–17.7)
MCH: 26.5 pg — ABNORMAL LOW (ref 26.6–33.0)
MCHC: 33.2 g/dL (ref 31.5–35.7)
MCV: 80 fL (ref 79–97)
Platelets: 228 10*3/uL (ref 150–450)
RBC: 4.76 x10E6/uL (ref 4.14–5.80)
RDW: 14.1 % (ref 11.6–15.4)
WBC: 9.2 10*3/uL (ref 3.4–10.8)

## 2022-06-29 LAB — MICROALBUMIN / CREATININE URINE RATIO
Creatinine, Urine: 64.1 mg/dL
Microalb/Creat Ratio: <5 mg/g creat (ref 0–29)
Microalbumin, Urine: <3 ug/mL

## 2022-06-29 LAB — LIPID PANEL
Chol/HDL Ratio: 2.1 ratio (ref 0.0–5.0)
Cholesterol, Total: 96 mg/dL — ABNORMAL LOW (ref 100–199)
HDL: 46 mg/dL (ref >39)
LDL Chol Calc (NIH): 28 mg/dL (ref 0–99)
Triglycerides: 128 mg/dL (ref 0–149)
VLDL Cholesterol Cal: 22 mg/dL (ref 5–40)

## 2022-06-29 MED ORDER — TRIAMCINOLONE ACETONIDE 10 MG/ML IJ SUSP
20.0000 mg | Freq: Once | INTRAMUSCULAR | Status: AC
  Administered 2022-06-29: 20 mg

## 2022-06-29 NOTE — Progress Notes (Signed)
Subjective:   Patient ID: Riley Clarke, male   DOB: 67 y.o.   MRN: [id number]   HPI Patient states my heels are starting to hurt again and ongoing about 3 months of relief with the medication   ROS      Objective:  Physical Exam  Neuro vas status intact with chronic heel pain bilateral and patient has not good surgical candidate with chronic inflammation and pain     Assessment:  Continued plantar fascial inflammation bilateral heel     Plan:  Sterile prep injected the plantar fascia bilateral at insertion 3 mg Kenalog 5 mg Xylocaine

## 2022-07-11 DIAGNOSIS — M961 Postlaminectomy syndrome, not elsewhere classified: Secondary | ICD-10-CM | POA: Diagnosis not present

## 2022-07-11 DIAGNOSIS — G8929 Other chronic pain: Secondary | ICD-10-CM | POA: Diagnosis not present

## 2022-07-11 DIAGNOSIS — Z79899 Other long term (current) drug therapy: Secondary | ICD-10-CM | POA: Diagnosis not present

## 2022-07-11 DIAGNOSIS — M545 Low back pain, unspecified: Secondary | ICD-10-CM | POA: Diagnosis not present

## 2022-07-11 DIAGNOSIS — M25512 Pain in left shoulder: Secondary | ICD-10-CM | POA: Diagnosis not present

## 2022-07-13 ENCOUNTER — Telehealth: Payer: Self-pay

## 2022-07-13 NOTE — Telephone Encounter (Signed)
Unsuccessful attempt to reach patient on preferred number listed in notes for scheduled AWV. Left message on voicemail okay to reschedule,

## 2022-08-02 ENCOUNTER — Telehealth: Payer: Self-pay | Admitting: Nurse Practitioner

## 2022-08-02 NOTE — Telephone Encounter (Signed)
Contacted Kwane Amelio Sowell to schedule their annual wellness visit. Appointment made for 08/10/2022.  Thank you,  [place] Direct dial  [phone]

## 2022-08-10 ENCOUNTER — Telehealth: Payer: Self-pay

## 2022-08-10 DIAGNOSIS — Z0000 Encounter for general adult medical examination without abnormal findings: Secondary | ICD-10-CM

## 2022-08-10 NOTE — Progress Notes (Signed)
Subjective:   Riley Clarke is a 67 y.o. male who presents for Medicare Annual/Subsequent preventive examination.  Review of Systems    Virtual Visit via Telephone Note  I connected with  Riley Clarke on 08/10/22 at  1:30 PM EST by telephone and verified that I am speaking with the correct person using two identifiers.  Location: Patient: Home Provider: Office Persons participating in the virtual visit: patient/Nurse Health Advisor   I discussed the limitations, risks, security and privacy concerns of performing an evaluation and management service by telephone and the availability of in person appointments. The patient expressed understanding and agreed to proceed.  Interactive audio and video telecommunications were attempted between this nurse and patient, however failed, due to patient having technical difficulties OR patient did not have access to video capability.  We continued and completed visit with audio only.  Some vital signs may be absent or patient reported.   Criselda Peaches, LPN        Objective:    Today's Vitals   There is no height or weight on file to calculate BMI.     03/02/2021    1:15 PM 02/24/2021    3:58 PM 10/13/2019    2:12 PM 09/15/2019    2:21 PM 06/19/2019    6:25 AM 12/30/2018    8:15 AM 12/23/2018   10:45 AM  Advanced Directives  Does Patient Have a Medical Advance Directive?  No No No No No No  Would patient like information on creating a medical advance directive? No - Patient declined Yes (ED - Information included in AVS)  No - Patient declined No - Patient declined No - Patient declined No - Patient declined    Current Medications (verified) Outpatient Encounter Medications as of 08/10/2022  Medication Sig   amLODipine (NORVASC) 10 MG tablet TAKE 1 TABLET(10 MG) BY MOUTH DAILY   benzonatate (TESSALON) 200 MG capsule Take 1 capsule (200 mg total) by mouth 2 (two) times daily as needed for cough. (Patient not taking: Reported on  06/28/2022)   cetirizine (ZYRTEC) 10 MG tablet Take 1 tablet (10 mg total) by mouth daily.   Continuous Blood Gluc Receiver (FREESTYLE LIBRE 2 READER) DEVI 1 each by Does not apply route in the morning, at noon, and at bedtime.   cyclobenzaprine (FLEXERIL) 10 MG tablet Take 1 tablet (10 mg total) by mouth 3 (three) times daily as needed for muscle spasms.   esomeprazole (NEXIUM) 40 MG capsule TAKE ONE (1) CAPSULE BY MOUTH EVERY MORNING BEFORE BREAKFAST (Patient taking differently: Take 40 mg by mouth daily.)   finasteride (PROSCAR) 5 MG tablet TAKE 1 TABLET(5 MG) BY MOUTH DAILY   gabapentin (NEURONTIN) 300 MG capsule TAKE 1 CAPSULE(300 MG) BY MOUTH THREE TIMES DAILY   glucose blood (ACCU-CHEK AVIVA) test strip Use as instructed   ibuprofen (ADVIL) 200 MG tablet Take 800 mg by mouth every 6 (six) hours as needed for headache or mild pain.   JARDIANCE 25 MG TABS tablet TAKE 1 TABLET(25 MG) BY MOUTH DAILY   lisinopril-hydrochlorothiazide (ZESTORETIC) 20-25 MG tablet Take 1 tablet by mouth daily.   meloxicam (MOBIC) 15 MG tablet Take 15 mg by mouth daily.   metFORMIN (GLUCOPHAGE) 1000 MG tablet TAKE 1 TABLET(1000 MG) BY MOUTH TWICE DAILY WITH A MEAL   NUCYNTA 75 MG tablet SMARTSIG:1 Tablet(s) By Mouth 2-4 Times Daily   oxyCODONE (OXY IR/ROXICODONE) 5 MG immediate release tablet Take 5 mg by mouth every 6 (six) hours as  needed.   simvastatin (ZOCOR) 10 MG tablet TAKE 1 TABLET(10 MG) BY MOUTH DAILY   TRADJENTA 5 MG TABS tablet TAKE 1 TABLET(5 MG) BY MOUTH DAILY   TRULICITY 1.5 0000000 SOPN ADMINISTER 1.5 MG UNDER THE SKIN EVERY MONDAY   No facility-administered encounter medications on file as of 08/10/2022.    Allergies (verified) Cymbalta [duloxetine hcl], Shrimp [shellfish allergy], and Valium [diazepam]   History: Past Medical History:  Diagnosis Date   Arthritis    Chronic back pain    on chronic opioids   Diabetes mellitus without complication (HCC)    GERD (gastroesophageal reflux  disease)    Headache(784.0)    HLD (hyperlipidemia)    Hypertension    no meds   Vitamin D deficiency    Past Surgical History:  Procedure Laterality Date   CERVICAL DISC SURGERY  99   LUMBAR FUSION     LUMBAR LAMINECTOMY/DECOMPRESSION MICRODISCECTOMY Bilateral 03/09/2021   Procedure: Laminectomy and Foraminotomy - bilateral - Lumbar two-Lumbar three;  Surgeon: Earnie Larsson, MD;  Location: [place];  Service: Neurosurgery;  Laterality: Bilateral;   ROTATOR CUFF REPAIR Bilateral    SHOULDER ARTHROSCOPY WITH ROTATOR CUFF REPAIR AND SUBACROMIAL DECOMPRESSION Right 12/30/2018   Procedure: SHOULDER ARTHROSCOPY WITH ROTATOR CUFF REPAIR AND SUBACROMIAL DECOMPRESSION, DISTAL CLAVICLE EXICISION,;  Surgeon: Hiram Gash, MD;  Location: [place];  Service: Orthopedics;  Laterality: Right;   SHOULDER ARTHROSCOPY WITH SUBACROMIAL DECOMPRESSION, ROTATOR CUFF REPAIR AND BICEP TENDON REPAIR Left 06/19/2019   Procedure: LEFT SHOULDER ARTHROSCOPY, EXTENTSIVE DEBRIDEMENT, DISTAL CLAVICULECTOMY, SUBACROMIAL DECOMPRESSION, PARTIAL ACROMIOPLASTY WITH CORACOACROMIAL RELEASE, ROTATOR CUFFF REPAIR AND BICEP TENODESIS;  Surgeon: Hiram Gash, MD;  Location: [place];  Service: Orthopedics;  Laterality: Left;   Family History  Problem Relation Age of Onset   Heart disease Mother    Diabetes Mother    Amblyopia Neg Hx    Blindness Neg Hx    Cataracts Neg Hx    Hypertension Neg Hx    Macular degeneration Neg Hx    Retinal detachment Neg Hx    Strabismus Neg Hx    Esophageal cancer Neg Hx    Colon cancer Neg Hx    Pancreatic cancer Neg Hx    Ulcerative colitis Neg Hx    Inflammatory bowel disease Neg Hx    Liver disease Neg Hx    Rectal cancer Neg Hx    Stomach cancer Neg Hx    Social History   Socioeconomic History   Marital status: Single    Spouse name: Not on file   Number of children: 3   Years of education: Not on file   Highest education level: Not on file   Occupational History   Occupation: disability  Tobacco Use   Smoking status: Former    Packs/day: 0.25    Years: 25.00    Total pack years: 6.25    Types: Cigarettes    Quit date: 04/26/2020    Years since quitting: 2.2   Smokeless tobacco: Never  Vaping Use   Vaping Use: Never used  Substance and Sexual Activity   Alcohol use: Yes    Alcohol/week: 4.0 standard drinks of alcohol    Types: 4 Cans of beer per week    Comment: twice a week   Drug use: No   Sexual activity: Not Currently  Other Topics Concern   Not on file  Social History Narrative   Right handed   Lives alone in a one story home  Social Determinants of Health   Financial Resource Strain: Not on file  Food Insecurity: Not on file  Transportation Needs: No Transportation Needs (02/24/2021)   PRAPARE - Hydrologist (Medical): No    Lack of Transportation (Non-Medical): No  Physical Activity: Insufficiently Active (02/24/2021)   Exercise Vital Sign    Days of Exercise per Week: 7 days    Minutes of Exercise per Session: 10 min  Stress: Not on file  Social Connections: Not on file    Tobacco Counseling Counseling given: Not Answered   Clinical Intake:      Nutrition Risk Assessment:  Has the patient had any N/V/D within the last 2 months?  No  Does the patient have any non-healing wounds?  No  Has the patient had any unintentional weight loss or weight gain?  No   Diabetes:  Is the patient diabetic?  Yes  If diabetic, was a CBG obtained today?  No  Did the patient bring in their glucometer from home?  No  How often do you monitor your CBG's? 2 X Daily.   Financial Strains and Diabetes Management:  Are you having any financial strains with the device, your supplies or your medication? No .  Does the patient want to be seen by Chronic Care Management for management of their diabetes?  No  Would the patient like to be referred to a Nutritionist or for Diabetic  Management?  No   Diabetic Exams:  Diabetic Eye Exam: Completed . Overdue for diabetic eye exam. Pt has been advised about the importance in completing this exam. A referral has been placed today. Message sent to referral coordinator for scheduling purposes. Advised pt to expect a call from office referred to regarding appt.  Diabetic Foot Exam: Completed . Pt has been advised about the importance in completing this exam. Pt is scheduled for diabetic foot exam on .          How often do you need to have someone help you when you read instructions, pamphlets, or other written materials from your doctor or pharmacy?: (P) 1 - Never  Diabetic? Yes         Activities of Daily Living    08/10/2022    7:16 AM  In your present state of health, do you have any difficulty performing the following activities:  Hearing? 0  Vision? 0  Difficulty concentrating or making decisions? 0  Walking or climbing stairs? 0  Dressing or bathing? 0  Doing errands, shopping? 0  Preparing Food and eating ? N  Using the Toilet? N  In the past six months, have you accidently leaked urine? N  Do you have problems with loss of bowel control? N  Managing your Medications? N  Managing your Finances? N  Housekeeping or managing your Housekeeping? N    Patient Care Team: Fenton Foy, NP as PCP - General (Pulmonary Disease)  Indicate any recent Medical Services you may have received from other than [facility] providers in the past year (date may be approximate).     Assessment:   This is a routine wellness examination for Yaakov.  Hearing/Vision screen No results found.  Dietary issues and exercise activities discussed:     Goals Addressed   None    Depression Screen    06/28/2022    2:19 PM 10/12/2021    2:31 PM 07/08/2021    1:52 PM 04/07/2021    1:13 PM 02/24/2021    4:00  PM 10/20/2020    2:06 PM 04/02/2018    2:11 PM  PHQ 2/9 Scores  PHQ - 2 Score 0 0 0 0 0 0 0  PHQ- 9 Score 0           Fall Risk    08/10/2022    7:16 AM 10/12/2021    2:30 PM 07/08/2021    1:52 PM 04/07/2021    1:13 PM 02/24/2021    3:59 PM  [place] in the past year? 0 0 0 0 0  Number falls in past yr: 0 0 0 0   Injury with Fall? 0 0 0 0   Risk for fall due to :  No Fall Risks     Follow up  Falls evaluation completed   Falls evaluation completed    FALL RISK PREVENTION PERTAINING TO THE HOME:  Any stairs in or around the home?  If so, are there any without handrails?  Home free of loose throw rugs in walkways, pet beds, electrical cords, etc?  Adequate lighting in your home to reduce risk of falls?   ASSISTIVE DEVICES UTILIZED TO PREVENT FALLS:  Life alert?  Use of a cane, walker or w/c?  Grab bars in the bathroom?  Shower chair or bench in shower?  Elevated toilet seat or a handicapped toilet?   TIMED UP AND GO:  Was the test performed? .  Length of time to ambulate 10 feet:  sec.     Cognitive Function:        02/24/2021    4:02 PM  6CIT Screen  What Year? 0 points  What month? 0 points  What time? 0 points  Count back from 20 0 points  Months in reverse 0 points  Repeat phrase 0 points  Total Score 0 points    Immunizations Immunization History  Administered Date(s) Administered   Influenza,inj,Quad PF,6+ Mos 04/01/2015, 02/04/2016, 02/19/2017, 04/02/2018, 02/02/2021   Moderna SARS-COV2 Booster Vaccination 05/04/2020   Moderna Sars-Covid-2 Vaccination 08/27/2019, 09/24/2019   Pneumococcal Conjugate-13 04/01/2015   Pneumococcal Polysaccharide-23 05/08/2016   Tdap 09/06/2014, 04/01/2015            Qualifies for Shingles Vaccine?   Zostavax completed     Screening Tests Health Maintenance  Topic Date Due   Zoster Vaccines- Shingrix (1 of 2) Never done   COVID-19 Vaccine (3 - Moderna risk series) 06/01/2020   OPHTHALMOLOGY EXAM  07/08/2020   FOOT EXAM  07/23/2021   INFLUENZA VACCINE  09/10/2022 (Originally 01/10/2022)   Fecal DNA (Cologuard)   09/01/2022   HEMOGLOBIN A1C  12/27/2022   Diabetic kidney evaluation - eGFR measurement  06/29/2023   Diabetic kidney evaluation - Urine ACR  06/29/2023   Medicare Annual Wellness (AWV)  08/10/2023   DTaP/Tdap/Td (3 - Td or Tdap) 03/31/2025   Hepatitis C Screening  Completed   HIV Screening  Completed   HPV VACCINES  Aged Out   COLONOSCOPY (Pts 45-32yr Insurance coverage will need to be confirmed)  Discontinued    Health Maintenance  Health Maintenance Due  Topic Date Due   Zoster Vaccines- Shingrix (1 of 2) Never done   COVID-19 Vaccine (3 - Moderna risk series) 06/01/2020   OPHTHALMOLOGY EXAM  07/08/2020   FOOT EXAM  07/23/2021      Lung Cancer Screening: (Low Dose CT Chest recommended if Age 65-80years, 30 pack-year currently smoking OR have quit w/in 15years.)  qualify.   Lung Cancer Screening Referral:   Additional  Screening:  Hepatitis C Screening:  qualify; Completed   Vision Screening: Recommended annual ophthalmology exams for early detection of glaucoma and other disorders of the eye. Is the patient up to date with their annual eye exam?   Who is the provider or what is the name of the office in which the patient attends annual eye exams?  If pt is not established with a provider, would they like to be referred to a provider to establish care?    Dental Screening: Recommended annual dental exams for proper oral hygiene  Community Resource Referral / Chronic Care Management: CRR required this visit?    CCM required this visit?       Plan:     I have personally reviewed and noted the following in the patient's chart:   Medical and social history Use of alcohol, tobacco or illicit drugs  Current medications and supplements including opioid prescriptions.  Functional ability and status Nutritional status Physical activity Advanced directives List of other physicians Hospitalizations, surgeries, and ER visits in previous 12 months Vitals Screenings  to include cognitive, depression, and falls Referrals and appointments  In addition, I have reviewed and discussed with patient certain preventive protocols, quality metrics, and best practice recommendations. A written personalized care plan for preventive services as well as general preventive health recommendations were provided to patient.     Criselda Peaches, LPN   [id number]   Nurse Notes: Patient decided to not complete this visit.    This encounter was created in error - please disregard.

## 2022-08-10 NOTE — Patient Instructions (Addendum)
Riley Clarke , Thank you for taking time to come for your Medicare Wellness Visit. I appreciate your ongoing commitment to your health goals. Please review the following plan we discussed and let me know if I can assist you in the future.   These are the goals we discussed:  Goals   None     This is a list of the screening recommended for you and due dates:  Health Maintenance  Topic Date Due   Zoster (Shingles) Vaccine (1 of 2) Never done   COVID-19 Vaccine (3 - Moderna risk series) 06/01/2020   Eye exam for diabetics  07/08/2020   Complete foot exam   07/23/2021   Cologuard (Stool DNA test)  09/01/2022   Flu Shot  01/11/2023   Hemoglobin A1C  03/03/2023   Yearly kidney health urinalysis for diabetes  06/29/2023   Medicare Annual Wellness Visit  08/10/2023   Yearly kidney function blood test for diabetes  09/10/2023   DTaP/Tdap/Td vaccine (3 - Td or Tdap) 03/31/2025   Hepatitis C Screening: USPSTF Recommendation to screen - Ages 75-79 yo.  Completed   HIV Screening  Completed   HPV Vaccine  Aged Out   Colon Cancer Screening  Discontinued    Advanced directives:   Conditions/risks identified: None  Next appointment: Follow up in one year for your annual wellness visit    Preventive Care 40-64 Years, Male Preventive care refers to lifestyle choices and visits with your health care provider that can promote health and wellness. What does preventive care include? A yearly physical exam. This is also called an annual well check. Dental exams once or twice a year. Routine eye exams. Ask your health care provider how often you should have your eyes checked. Personal lifestyle choices, including: Daily care of your teeth and gums. Regular physical activity. Eating a healthy diet. Avoiding tobacco and drug use. Limiting alcohol use. Practicing safe sex. Taking low-dose aspirin every day starting at age 37. What happens during an annual well check? The services and screenings  done by your health care provider during your annual well check will depend on your age, overall health, lifestyle risk factors, and family history of disease. Counseling  Your health care provider may ask you questions about your: Alcohol use. Tobacco use. Drug use. Emotional well-being. Home and relationship well-being. Sexual activity. Eating habits. Work and work Astronomer. Screening  You may have the following tests or measurements: Height, weight, and BMI. Blood pressure. Lipid and cholesterol levels. These may be checked every 5 years, or more frequently if you are over 78 years old. Skin check. Lung cancer screening. You may have this screening every year starting at age 13 if you have a 30-pack-year history of smoking and currently smoke or have quit within the past 15 years. Fecal occult blood test (FOBT) of the stool. You may have this test every year starting at age 35. Flexible sigmoidoscopy or colonoscopy. You may have a sigmoidoscopy every 5 years or a colonoscopy every 10 years starting at age 40. Prostate cancer screening. Recommendations will vary depending on your family history and other risks. Hepatitis C blood test. Hepatitis B blood test. Sexually transmitted disease (STD) testing. Diabetes screening. This is done by checking your blood sugar (glucose) after you have not eaten for a while (fasting). You may have this done every 1-3 years. Discuss your test results, treatment options, and if necessary, the need for more tests with your health care provider. Vaccines  Your health care  provider may recommend certain vaccines, such as: Influenza vaccine. This is recommended every year. Tetanus, diphtheria, and acellular pertussis (Tdap, Td) vaccine. You may need a Td booster every 10 years. Zoster vaccine. You may need this after age 22. Pneumococcal 13-valent conjugate (PCV13) vaccine. You may need this if you have certain conditions and have not been  vaccinated. Pneumococcal polysaccharide (PPSV23) vaccine. You may need one or two doses if you smoke cigarettes or if you have certain conditions. Talk to your health care provider about which screenings and vaccines you need and how often you need them. This information is not intended to replace advice given to you by your health care provider. Make sure you discuss any questions you have with your health care provider. Document Released: 06/25/2015 Document Revised: 02/16/2016 Document Reviewed: 03/30/2015 Elsevier Interactive Patient Education  2017 [place] Prevention in the Home Falls can cause injuries. They can happen to people of all ages. There are many things you can do to make your home safe and to help prevent falls. What can I do on the outside of my home? Regularly fix the edges of walkways and driveways and fix any cracks. Remove anything that might make you trip as you walk through a door, such as a raised step or threshold. Trim any bushes or trees on the path to your home. Use bright outdoor lighting. Clear any walking paths of anything that might make someone trip, such as rocks or tools. Regularly check to see if handrails are loose or broken. Make sure that both sides of any steps have handrails. Any raised decks and porches should have guardrails on the edges. Have any leaves, snow, or ice cleared regularly. Use sand or salt on walking paths during winter. Clean up any spills in your garage right away. This includes oil or grease spills. What can I do in the bathroom? Use night lights. Install grab bars by the toilet and in the tub and shower. Do not use towel bars as grab bars. Use non-skid mats or decals in the tub or shower. If you need to sit down in the shower, use a plastic, non-slip stool. Keep the floor dry. Clean up any water that spills on the floor as soon as it happens. Remove soap buildup in the tub or shower regularly. Attach bath mats  securely with double-sided non-slip rug tape. Do not have throw rugs and other things on the floor that can make you trip. What can I do in the bedroom? Use night lights. Make sure that you have a light by your bed that is easy to reach. Do not use any sheets or blankets that are too big for your bed. They should not hang down onto the floor. Have a firm chair that has side arms. You can use this for support while you get dressed. Do not have throw rugs and other things on the floor that can make you trip. What can I do in the kitchen? Clean up any spills right away. Avoid walking on wet floors. Keep items that you use a lot in easy-to-reach places. If you need to reach something above you, use a strong step stool that has a grab bar. Keep electrical cords out of the way. Do not use floor polish or wax that makes floors slippery. If you must use wax, use non-skid floor wax. Do not have throw rugs and other things on the floor that can make you trip. What can I do with my  stairs? Do not leave any items on the stairs. Make sure that there are handrails on both sides of the stairs and use them. Fix handrails that are broken or loose. Make sure that handrails are as long as the stairways. Check any carpeting to make sure that it is firmly attached to the stairs. Fix any carpet that is loose or worn. Avoid having throw rugs at the top or bottom of the stairs. If you do have throw rugs, attach them to the floor with carpet tape. Make sure that you have a light switch at the top of the stairs and the bottom of the stairs. If you do not have them, ask someone to add them for you. What else can I do to help prevent falls? Wear shoes that: Do not have high heels. Have rubber bottoms. Are comfortable and fit you well. Are closed at the toe. Do not wear sandals. If you use a stepladder: Make sure that it is fully opened. Do not climb a closed stepladder. Make sure that both sides of the stepladder  are locked into place. Ask someone to hold it for you, if possible. Clearly mark and make sure that you can see: Any grab bars or handrails. First and last steps. Where the edge of each step is. Use tools that help you move around (mobility aids) if they are needed. These include: Canes. Walkers. Scooters. Crutches. Turn on the lights when you go into a dark area. Replace any light bulbs as soon as they burn out. Set up your furniture so you have a clear path. Avoid moving your furniture around. If any of your floors are uneven, fix them. If there are any pets around you, be aware of where they are. Review your medicines with your doctor. Some medicines can make you feel dizzy. This can increase your chance of falling. Ask your doctor what other things that you can do to help prevent falls. This information is not intended to replace advice given to you by your health care provider. Make sure you discuss any questions you have with your health care provider. Document Released: 03/25/2009 Document Revised: 11/04/2015 Document Reviewed: 07/03/2014 Elsevier Interactive Patient Education  2017 [employer].

## 2022-08-10 NOTE — Telephone Encounter (Signed)
Contacted patient on preferred number listed in notes for scheduled AWV, Patient decided not to continue and complete this visit.

## 2022-08-28 ENCOUNTER — Other Ambulatory Visit: Payer: Self-pay | Admitting: Neurosurgery

## 2022-08-30 NOTE — Progress Notes (Signed)
Surgical Instructions    Your procedure is scheduled on September 05, 2022.  Report to [facility] Main Entrance "A" at 10:00 A.M., then check in with the Admitting office.  Call this number if you have problems the morning of surgery:  [phone]   If you have any questions prior to your surgery date call [phone]: Open Monday-Friday 8am-4pm If you experience any cold or flu symptoms such as cough, fever, chills, shortness of breath, etc. between now and your scheduled surgery, please notify us at the above number     Remember:  Do not eat after midnight the night before your surgery  You may drink clear liquids until 9:00AM the morning of your surgery.   Clear liquids allowed are: Water, Non-Citrus Juices (without pulp), Carbonated Beverages, Clear Tea, Black Coffee ONLY (NO MILK, CREAM OR POWDERED CREAMER of any kind), and Gatorade    Take these medicines the morning of surgery with A SIP OF WATER:  amLODipine (NORVASC)  esomeprazole (NEXIUM)  gabapentin (NEURONTIN)  simvastatin (ZOCOR)   If Needed: oxyCODONE-acetaminophen (PERCOCET)   As of today, STOP taking any Aspirin (unless otherwise instructed by your surgeon) Aleve, Naproxen, Ibuprofen, Motrin, Advil, Goody's, BC's, all herbal medications, fish oil, and all vitamins.  WHAT DO I DO ABOUT MY DIABETES MEDICATION?   Do not take oral diabetes medicines (pills) the morning of surgery. DO NOT take TRADJENTA the day of surgery.   DO NOT TAKE Trulicity for 7 days prior to surgery. DO not take after 08/28/22.  DO NOT TAKE Jardiance for 72 hours prior to surgery. Your last dose should be on 09/01/22.   The day of surgery, do not take other diabetes injectables, including Byetta (exenatide), Bydureon (exenatide ER), Victoza (liraglutide), or Trulicity (dulaglutide).  If your CBG is greater than 220 mg/dL, you may take  of your sliding scale (correction) dose of insulin.   HOW TO MANAGE YOUR DIABETES BEFORE AND AFTER  SURGERY  Why is it important to control my blood sugar before and after surgery? Improving blood sugar levels before and after surgery helps healing and can limit problems. A way of improving blood sugar control is eating a healthy diet by:  Eating less sugar and carbohydrates  Increasing activity/exercise  Talking with your doctor about reaching your blood sugar goals High blood sugars (greater than 180 mg/dL) can raise your risk of infections and slow your recovery, so you will need to focus on controlling your diabetes during the weeks before surgery. Make sure that the doctor who takes care of your diabetes knows about your planned surgery including the date and location.  How do I manage my blood sugar before surgery? Check your blood sugar at least 4 times a day, starting 2 days before surgery, to make sure that the level is not too high or low.  Check your blood sugar the morning of your surgery when you wake up and every 2 hours until you get to the Short Stay unit.  If your blood sugar is less than 70 mg/dL, you will need to treat for low blood sugar: Do not take insulin. Treat a low blood sugar (less than 70 mg/dL) with  cup of clear juice (cranberry or apple), 4 glucose tablets, OR glucose gel. Recheck blood sugar in 15 minutes after treatment (to make sure it is greater than 70 mg/dL). If your blood sugar is not greater than 70 mg/dL on recheck, call [phone] for further instructions. Report your blood sugar to the short stay  nurse when you get to Short Stay.  If you are admitted to the hospital after surgery: Your blood sugar will be checked by the staff and you will probably be given insulin after surgery (instead of oral diabetes medicines) to make sure you have good blood sugar levels. The goal for blood sugar control after surgery is 80-180 mg/dL.            [facility] is not responsible for any belongings or valuables.    Do NOT Smoke (Tobacco/Vaping)  24 hours  prior to your procedure  If you use a CPAP at night, you may bring your mask for your overnight stay.   Contacts, glasses, hearing aids, dentures or partials may not be worn into surgery, please bring cases for these belongings   For patients admitted to the hospital, discharge time will be determined by your treatment team.   Patients discharged the day of surgery will not be allowed to drive home, and someone needs to stay with them for 24 hours.   SURGICAL WAITING ROOM VISITATION Patients having surgery or a procedure may have no more than 2 support people in the waiting area - these visitors may rotate.   Children under the age of 25 must have an adult with them who is not the patient. If the patient needs to stay at the hospital during part of their recovery, the visitor guidelines for inpatient rooms apply. Pre-op nurse will coordinate an appropriate time for 1 support person to accompany patient in pre-op.  This support person may not rotate.   Please refer to [email or web address] for the visitor guidelines for Inpatients (after your surgery is over and you are in a regular room).    Special instructions:    Oral Hygiene is also important to reduce your risk of infection.  Remember - BRUSH YOUR TEETH THE MORNING OF SURGERY WITH YOUR REGULAR TOOTHPASTE   [facility]- Preparing For Surgery  Before surgery, you can play an important role. Because skin is not sterile, your skin needs to be as free of germs as possible. You can reduce the number of germs on your skin by washing with CHG (chlorahexidine gluconate) Soap before surgery.  CHG is an antiseptic cleaner which kills germs and bonds with the skin to continue killing germs even after washing.     Please do not use if you have an allergy to CHG or antibacterial soaps. If your skin becomes reddened/irritated stop using the CHG.  Do not shave (including legs and underarms) for at  least 48 hours prior to first CHG shower. It is OK to shave your face.  Please follow these instructions carefully.     Shower the NIGHT BEFORE SURGERY and the MORNING OF SURGERY with CHG Soap.   If you chose to wash your hair, wash your hair first as usual with your normal shampoo. After you shampoo, rinse your hair and body thoroughly to remove the shampoo.  Then [employer] and genitals (private parts) with your normal soap and rinse thoroughly to remove soap.  After that Use CHG Soap as you would any other liquid soap. You can apply CHG directly to the skin and wash gently with a scrungie or a clean washcloth.   Apply the CHG Soap to your body ONLY FROM THE NECK DOWN.  Do not use on open wounds or open sores. Avoid contact with your eyes, ears, mouth and genitals (private parts). Wash Face and genitals (private parts)  with your normal soap.  Wash thoroughly, paying special attention to the area where your surgery will be performed.  Thoroughly rinse your body with warm water from the neck down.  DO NOT shower/wash with your normal soap after using and rinsing off the CHG Soap.  Pat yourself dry with a CLEAN TOWEL.  Wear CLEAN PAJAMAS to bed the night before surgery  Place CLEAN SHEETS on your bed the night before your surgery  DO NOT SLEEP WITH PETS.   Day of Surgery:  Take a shower with CHG soap. Wear Clean/Comfortable clothing the morning of surgery Do not wear jewelry or makeup. Do not wear lotions, powders, perfumes/cologne or deodorant. Do not shave 48 hours prior to surgery.  Men may shave face and neck. Do not bring valuables to the hospital. Do not wear nail polish, gel polish, artificial nails, or any other type of covering on natural nails (fingers and toes) If you have artificial nails or gel coating that need to be removed by a nail salon, please have this removed prior to surgery. Artificial nails or gel coating may interfere with anesthesia's ability to  adequately monitor your vital signs. Remember to brush your teeth WITH YOUR REGULAR TOOTHPASTE.    If you received a COVID test during your pre-op visit, it is requested that you wear a mask when out in public, stay away from anyone that may not be feeling well, and notify your surgeon if you develop symptoms. If you have been in contact with anyone that has tested positive in the last 10 days, please notify your surgeon.    Please read over the following fact sheets that you were given.

## 2022-08-31 ENCOUNTER — Other Ambulatory Visit: Payer: Self-pay

## 2022-08-31 ENCOUNTER — Encounter (HOSPITAL_COMMUNITY)
Admission: RE | Admit: 2022-08-31 | Discharge: 2022-08-31 | Disposition: A | Discharge status: Home / Self Care | Payer: 59 | Source: Ambulatory Visit | Attending: Neurosurgery | Admitting: Neurosurgery

## 2022-08-31 ENCOUNTER — Encounter (HOSPITAL_COMMUNITY): Payer: Self-pay

## 2022-08-31 VITALS — BP 121/60 | HR 93 | Temp 98.6°F | Resp 17 | Ht 72.0 in | Wt 216.0 lb

## 2022-08-31 DIAGNOSIS — Z01818 Encounter for other preprocedural examination: Secondary | ICD-10-CM | POA: Diagnosis present

## 2022-08-31 DIAGNOSIS — E119 Type 2 diabetes mellitus without complications: Secondary | ICD-10-CM | POA: Diagnosis not present

## 2022-08-31 DIAGNOSIS — I10 Essential (primary) hypertension: Secondary | ICD-10-CM | POA: Diagnosis not present

## 2022-08-31 DIAGNOSIS — Z794 Long term (current) use of insulin: Secondary | ICD-10-CM | POA: Insufficient documentation

## 2022-08-31 LAB — HEMOGLOBIN A1C
Hgb A1c MFr Bld: 8.6 % — ABNORMAL HIGH (ref 4.8–5.6)
Mean Plasma Glucose: 200 mg/dL

## 2022-08-31 LAB — BASIC METABOLIC PANEL
Anion gap: 12 (ref 5–15)
BUN: 9 mg/dL (ref 8–23)
CO2: 22 mmol/L (ref 22–32)
Calcium: 9 mg/dL (ref 8.9–10.3)
Chloride: 102 mmol/L (ref 98–111)
Creatinine, Ser: 0.77 mg/dL (ref 0.61–1.24)
GFR, Estimated: >60 mL/min (ref >60)
Glucose, Bld: 259 mg/dL — ABNORMAL HIGH (ref 70–99)
Potassium: 4.3 mmol/L (ref 3.5–5.1)
Sodium: 136 mmol/L (ref 135–145)

## 2022-08-31 LAB — SURGICAL PCR SCREEN
MRSA, PCR: NEGATIVE
Staphylococcus aureus: POSITIVE — AB

## 2022-08-31 LAB — CBC
HCT: 43.5 % (ref 39.0–52.0)
Hemoglobin: 13.5 g/dL (ref 13.0–17.0)
MCH: 26.5 pg (ref 26.0–34.0)
MCHC: 31 g/dL (ref 30.0–36.0)
MCV: 85.5 fL (ref 80.0–100.0)
Platelets: 232 10*3/uL (ref 150–400)
RBC: 5.09 MIL/uL (ref 4.22–5.81)
RDW: 15.5 % (ref 11.5–15.5)
WBC: 9.1 10*3/uL (ref 4.0–10.5)
nRBC: 0 % (ref 0.0–0.2)

## 2022-08-31 LAB — GLUCOSE, CAPILLARY: Glucose-Capillary: 296 mg/dL — ABNORMAL HIGH (ref 70–99)

## 2022-08-31 NOTE — Progress Notes (Signed)
PCP - Lazaro Arms Cardiologist - Denies  PPM/ICD - Denies  Chest x-ray - Denies EKG - 08/31/2022 Stress Test - 08/24/2012 ECHO - Denies Cardiac Cath - Denies  Sleep Study - Denies  DM: Type II Fasting Blood Sugar: 192-195 Checks Blood Sugar twice a day  Last dose of GLP1 agonist- 08/28/2022 GLP1 instructions: Hold Trulicity 7 days Prior to surgery. Last dose on 08/28/2022  Blood Thinner Instructions: N/A Aspirin Instructions:N/A  ERAS Protcol - Yes PRE-SURGERY Ensure or G2- No drink  COVID TEST- N/A   Anesthesia review: No  Patient denies shortness of breath, fever, cough and chest pain at PAT appointment   All instructions explained to the patient, with a verbal understanding of the material. Patient agrees to go over the instructions while at home for a better understanding. The opportunity to ask questions was provided.

## 2022-09-05 ENCOUNTER — Ambulatory Visit (HOSPITAL_COMMUNITY): Payer: 59 | Admitting: Anesthesiology

## 2022-09-05 ENCOUNTER — Ambulatory Visit (HOSPITAL_COMMUNITY)
Admission: RE | Disposition: A | Discharge status: Home / Self Care | Payer: Self-pay | Source: Ambulatory Visit | Attending: Neurosurgery

## 2022-09-05 ENCOUNTER — Ambulatory Visit (HOSPITAL_COMMUNITY): Payer: 59

## 2022-09-05 ENCOUNTER — Encounter (HOSPITAL_COMMUNITY): Payer: Self-pay | Admitting: Neurosurgery

## 2022-09-05 ENCOUNTER — Other Ambulatory Visit: Payer: Self-pay

## 2022-09-05 ENCOUNTER — Ambulatory Visit (HOSPITAL_BASED_OUTPATIENT_CLINIC_OR_DEPARTMENT_OTHER): Payer: 59 | Admitting: Anesthesiology

## 2022-09-05 ENCOUNTER — Observation Stay (HOSPITAL_COMMUNITY)
Admission: RE | Admit: 2022-09-05 | Discharge: 2022-09-06 | Disposition: A | Discharge status: Home / Self Care | Payer: 59 | Source: Ambulatory Visit | Attending: Neurosurgery | Admitting: Neurosurgery

## 2022-09-05 DIAGNOSIS — I10 Essential (primary) hypertension: Secondary | ICD-10-CM | POA: Insufficient documentation

## 2022-09-05 DIAGNOSIS — Z7984 Long term (current) use of oral hypoglycemic drugs: Secondary | ICD-10-CM

## 2022-09-05 DIAGNOSIS — Z87891 Personal history of nicotine dependence: Secondary | ICD-10-CM | POA: Insufficient documentation

## 2022-09-05 DIAGNOSIS — M4712 Other spondylosis with myelopathy, cervical region: Secondary | ICD-10-CM | POA: Diagnosis not present

## 2022-09-05 DIAGNOSIS — E119 Type 2 diabetes mellitus without complications: Secondary | ICD-10-CM | POA: Diagnosis not present

## 2022-09-05 DIAGNOSIS — Z79899 Other long term (current) drug therapy: Secondary | ICD-10-CM | POA: Diagnosis not present

## 2022-09-05 DIAGNOSIS — M5001 Cervical disc disorder with myelopathy,  high cervical region: Secondary | ICD-10-CM | POA: Diagnosis present

## 2022-09-05 DIAGNOSIS — M4722 Other spondylosis with radiculopathy, cervical region: Secondary | ICD-10-CM | POA: Diagnosis not present

## 2022-09-05 HISTORY — PX: ANTERIOR CERVICAL DECOMP/DISCECTOMY FUSION: SHX1161

## 2022-09-05 LAB — GLUCOSE, CAPILLARY
Glucose-Capillary: 137 mg/dL — ABNORMAL HIGH (ref 70–99)
Glucose-Capillary: 110 mg/dL — ABNORMAL HIGH (ref 70–99)
Glucose-Capillary: 126 mg/dL — ABNORMAL HIGH (ref 70–99)
Glucose-Capillary: 128 mg/dL — ABNORMAL HIGH (ref 70–99)
Glucose-Capillary: 213 mg/dL — ABNORMAL HIGH (ref 70–99)

## 2022-09-05 SURGERY — ANTERIOR CERVICAL DECOMPRESSION/DISCECTOMY FUSION 1 LEVEL
Anesthesia: General

## 2022-09-05 MED ORDER — INSULIN ASPART 100 UNIT/ML IJ SOLN
0.0000 [IU] | Freq: Three times a day (TID) | INTRAMUSCULAR | Status: DC
  Administered 2022-09-05: 2 [IU] via SUBCUTANEOUS

## 2022-09-05 MED ORDER — GABAPENTIN 300 MG PO CAPS
300.0000 mg | ORAL_CAPSULE | Freq: Three times a day (TID) | ORAL | Status: DC
  Administered 2022-09-05 – 2022-09-06 (×3): 300 mg via ORAL
  Filled 2022-09-05 (×3): qty 1

## 2022-09-05 MED ORDER — SODIUM CHLORIDE 0.9 % IV SOLN: 250.0000 mL | INTRAVENOUS | Status: DC

## 2022-09-05 MED ORDER — PHENYLEPHRINE 80 MCG/ML (10ML) SYRINGE FOR IV PUSH (FOR BLOOD PRESSURE SUPPORT)
PREFILLED_SYRINGE | INTRAVENOUS | Status: AC
  Filled 2022-09-05: qty 20

## 2022-09-05 MED ORDER — HYDROCODONE-ACETAMINOPHEN 10-325 MG PO TABS
1.0000 | ORAL_TABLET | ORAL | Status: DC | PRN
  Administered 2022-09-05: 1 via ORAL
  Administered 2022-09-06: 2 via ORAL
  Administered 2022-09-06: 1 via ORAL
  Filled 2022-09-05: qty 1
  Filled 2022-09-05: qty 2
  Filled 2022-09-05 (×2): qty 1

## 2022-09-05 MED ORDER — DEXAMETHASONE SODIUM PHOSPHATE 10 MG/ML IJ SOLN
INTRAMUSCULAR | Status: DC | PRN
  Administered 2022-09-05: 10 mg via INTRAVENOUS

## 2022-09-05 MED ORDER — LINAGLIPTIN 5 MG PO TABS
5.0000 mg | ORAL_TABLET | Freq: Every day | ORAL | Status: DC
  Administered 2022-09-06: 5 mg via ORAL
  Filled 2022-09-05 (×2): qty 1

## 2022-09-05 MED ORDER — THROMBIN 5000 UNITS EX SOLR
CUTANEOUS | Status: AC
  Filled 2022-09-05: qty 5000

## 2022-09-05 MED ORDER — SUGAMMADEX SODIUM 200 MG/2ML IV SOLN
INTRAVENOUS | Status: DC | PRN
  Administered 2022-09-05: 200 mg via INTRAVENOUS

## 2022-09-05 MED ORDER — ACETAMINOPHEN 500 MG PO TABS
1000.0000 mg | ORAL_TABLET | Freq: Once | ORAL | Status: AC
  Administered 2022-09-05: 1000 mg via ORAL
  Filled 2022-09-05: qty 2

## 2022-09-05 MED ORDER — ROCURONIUM BROMIDE 10 MG/ML (PF) SYRINGE
PREFILLED_SYRINGE | INTRAVENOUS | Status: DC | PRN
  Administered 2022-09-05: 20 mg via INTRAVENOUS
  Administered 2022-09-05: 60 mg via INTRAVENOUS

## 2022-09-05 MED ORDER — OXYCODONE HCL 5 MG/5ML PO SOLN: 5.0000 mg | Freq: Once | ORAL | Status: DC | PRN

## 2022-09-05 MED ORDER — PROMETHAZINE HCL 25 MG/ML IJ SOLN: 6.2500 mg | INTRAMUSCULAR | Status: DC | PRN

## 2022-09-05 MED ORDER — OXYCODONE-ACETAMINOPHEN 10-325 MG PO TABS: 1.0000 | ORAL_TABLET | ORAL | Status: DC | PRN

## 2022-09-05 MED ORDER — HYDROMORPHONE HCL 1 MG/ML IJ SOLN
0.2500 mg | INTRAMUSCULAR | Status: DC | PRN
  Administered 2022-09-05 (×2): 0.5 mg via INTRAVENOUS

## 2022-09-05 MED ORDER — PHENOL 1.4 % MT LIQD: 1.0000 | OROMUCOSAL | Status: DC | PRN

## 2022-09-05 MED ORDER — GABAPENTIN 300 MG PO CAPS
300.0000 mg | ORAL_CAPSULE | Freq: Once | ORAL | Status: AC
  Administered 2022-09-05: 300 mg via ORAL
  Filled 2022-09-05: qty 1

## 2022-09-05 MED ORDER — CHLORHEXIDINE GLUCONATE CLOTH 2 % EX PADS: 6.0000 | MEDICATED_PAD | Freq: Once | CUTANEOUS | Status: DC

## 2022-09-05 MED ORDER — HYDROMORPHONE HCL 1 MG/ML IJ SOLN: 1.0000 mg | INTRAMUSCULAR | Status: DC | PRN

## 2022-09-05 MED ORDER — DEXAMETHASONE SODIUM PHOSPHATE 10 MG/ML IJ SOLN
INTRAMUSCULAR | Status: AC
  Filled 2022-09-05: qty 2

## 2022-09-05 MED ORDER — LIDOCAINE 2% (20 MG/ML) 5 ML SYRINGE
INTRAMUSCULAR | Status: AC
  Filled 2022-09-05: qty 10

## 2022-09-05 MED ORDER — SODIUM CHLORIDE 0.9% FLUSH: 3.0000 mL | INTRAVENOUS | Status: DC | PRN

## 2022-09-05 MED ORDER — LIDOCAINE 2% (20 MG/ML) 5 ML SYRINGE
INTRAMUSCULAR | Status: DC | PRN
  Administered 2022-09-05: 80 mg via INTRAVENOUS

## 2022-09-05 MED ORDER — METFORMIN HCL 500 MG PO TABS
1000.0000 mg | ORAL_TABLET | Freq: Two times a day (BID) | ORAL | Status: DC
  Administered 2022-09-05 – 2022-09-06 (×2): 1000 mg via ORAL
  Filled 2022-09-05 (×2): qty 2

## 2022-09-05 MED ORDER — MIDAZOLAM HCL 2 MG/2ML IJ SOLN
INTRAMUSCULAR | Status: AC
  Filled 2022-09-05: qty 2

## 2022-09-05 MED ORDER — FENTANYL CITRATE (PF) 250 MCG/5ML IJ SOLN
INTRAMUSCULAR | Status: AC
  Filled 2022-09-05: qty 5

## 2022-09-05 MED ORDER — HYDROCODONE-ACETAMINOPHEN 5-325 MG PO TABS
1.0000 | ORAL_TABLET | ORAL | Status: DC | PRN
  Administered 2022-09-05: 1 via ORAL
  Filled 2022-09-05: qty 1

## 2022-09-05 MED ORDER — MUPIROCIN 2 % EX OINT
TOPICAL_OINTMENT | CUTANEOUS | Status: AC
  Administered 2022-09-05: 1 via TOPICAL
  Filled 2022-09-05: qty 22

## 2022-09-05 MED ORDER — OXYCODONE HCL 5 MG PO TABS: 5.0000 mg | ORAL_TABLET | Freq: Once | ORAL | Status: DC | PRN

## 2022-09-05 MED ORDER — ACETAMINOPHEN 325 MG PO TABS: 650.0000 mg | ORAL_TABLET | ORAL | Status: DC | PRN

## 2022-09-05 MED ORDER — ONDANSETRON HCL 4 MG PO TABS: 4.0000 mg | ORAL_TABLET | Freq: Four times a day (QID) | ORAL | Status: DC | PRN

## 2022-09-05 MED ORDER — MEPERIDINE HCL 25 MG/ML IJ SOLN: 6.2500 mg | INTRAMUSCULAR | Status: DC | PRN

## 2022-09-05 MED ORDER — HYDROCODONE-ACETAMINOPHEN 5-325 MG PO TABS: 1.0000 | ORAL_TABLET | ORAL | Status: DC | PRN

## 2022-09-05 MED ORDER — MUPIROCIN 2 % EX OINT: 1.0000 | TOPICAL_OINTMENT | Freq: Two times a day (BID) | CUTANEOUS | Status: DC

## 2022-09-05 MED ORDER — CEFAZOLIN SODIUM-DEXTROSE 1-4 GM/50ML-% IV SOLN
1.0000 g | Freq: Three times a day (TID) | INTRAVENOUS | Status: AC
  Administered 2022-09-05 – 2022-09-06 (×2): 1 g via INTRAVENOUS
  Filled 2022-09-05 (×2): qty 50

## 2022-09-05 MED ORDER — EPHEDRINE 5 MG/ML INJ
INTRAVENOUS | Status: AC
  Filled 2022-09-05: qty 5

## 2022-09-05 MED ORDER — PHENYLEPHRINE HCL-NACL 20-0.9 MG/250ML-% IV SOLN
INTRAVENOUS | Status: DC | PRN
  Administered 2022-09-05: 25 ug/min via INTRAVENOUS

## 2022-09-05 MED ORDER — INSULIN ASPART 100 UNIT/ML IJ SOLN
0.0000 [IU] | Freq: Three times a day (TID) | INTRAMUSCULAR | Status: DC
  Administered 2022-09-06: 3 [IU] via SUBCUTANEOUS

## 2022-09-05 MED ORDER — SODIUM CHLORIDE 0.9% FLUSH
3.0000 mL | Freq: Two times a day (BID) | INTRAVENOUS | Status: DC
  Administered 2022-09-05: 3 mL via INTRAVENOUS

## 2022-09-05 MED ORDER — ONDANSETRON HCL 4 MG/2ML IJ SOLN
INTRAMUSCULAR | Status: DC | PRN
  Administered 2022-09-05: 4 mg via INTRAVENOUS

## 2022-09-05 MED ORDER — ORAL CARE MOUTH RINSE: 15.0000 mL | Freq: Once | OROMUCOSAL | Status: AC

## 2022-09-05 MED ORDER — SIMVASTATIN 20 MG PO TABS
10.0000 mg | ORAL_TABLET | Freq: Every day | ORAL | Status: DC
  Administered 2022-09-05: 10 mg via ORAL
  Filled 2022-09-05: qty 1

## 2022-09-05 MED ORDER — ACETAMINOPHEN 650 MG RE SUPP: 650.0000 mg | RECTAL | Status: DC | PRN

## 2022-09-05 MED ORDER — CHLORHEXIDINE GLUCONATE 0.12 % MT SOLN
15.0000 mL | Freq: Once | OROMUCOSAL | Status: AC
  Administered 2022-09-05: 15 mL via OROMUCOSAL
  Filled 2022-09-05: qty 15

## 2022-09-05 MED ORDER — ROCURONIUM BROMIDE 10 MG/ML (PF) SYRINGE
PREFILLED_SYRINGE | INTRAVENOUS | Status: AC
  Filled 2022-09-05: qty 20

## 2022-09-05 MED ORDER — HYDROCODONE-ACETAMINOPHEN 10-325 MG PO TABS: 2.0000 | ORAL_TABLET | ORAL | Status: DC | PRN

## 2022-09-05 MED ORDER — PROPOFOL 10 MG/ML IV BOLUS
INTRAVENOUS | Status: DC | PRN
  Administered 2022-09-05: 20 mg via INTRAVENOUS
  Administered 2022-09-05: 140 mg via INTRAVENOUS

## 2022-09-05 MED ORDER — MENTHOL 3 MG MT LOZG: 1.0000 | LOZENGE | OROMUCOSAL | Status: DC | PRN

## 2022-09-05 MED ORDER — PHENYLEPHRINE 80 MCG/ML (10ML) SYRINGE FOR IV PUSH (FOR BLOOD PRESSURE SUPPORT)
PREFILLED_SYRINGE | INTRAVENOUS | Status: DC | PRN
  Administered 2022-09-05: 80 ug via INTRAVENOUS

## 2022-09-05 MED ORDER — CEFAZOLIN SODIUM-DEXTROSE 2-4 GM/100ML-% IV SOLN
INTRAVENOUS | Status: AC
  Filled 2022-09-05: qty 100

## 2022-09-05 MED ORDER — ONDANSETRON HCL 4 MG/2ML IJ SOLN
4.0000 mg | Freq: Four times a day (QID) | INTRAMUSCULAR | Status: DC | PRN
  Administered 2022-09-05: 4 mg via INTRAVENOUS
  Filled 2022-09-05: qty 2

## 2022-09-05 MED ORDER — FENTANYL CITRATE (PF) 250 MCG/5ML IJ SOLN
INTRAMUSCULAR | Status: DC | PRN
  Administered 2022-09-05: 50 ug via INTRAVENOUS
  Administered 2022-09-05: 150 ug via INTRAVENOUS
  Administered 2022-09-05: 100 ug via INTRAVENOUS

## 2022-09-05 MED ORDER — HYDROMORPHONE HCL 1 MG/ML IJ SOLN
INTRAMUSCULAR | Status: AC
  Filled 2022-09-05: qty 1

## 2022-09-05 MED ORDER — PANTOPRAZOLE SODIUM 40 MG PO TBEC
40.0000 mg | DELAYED_RELEASE_TABLET | Freq: Every day | ORAL | Status: DC
  Administered 2022-09-05 – 2022-09-06 (×2): 40 mg via ORAL
  Filled 2022-09-05 (×2): qty 1

## 2022-09-05 MED ORDER — ONDANSETRON HCL 4 MG/2ML IJ SOLN
INTRAMUSCULAR | Status: AC
  Filled 2022-09-05: qty 4

## 2022-09-05 MED ORDER — EMPAGLIFLOZIN 25 MG PO TABS
25.0000 mg | ORAL_TABLET | Freq: Every day | ORAL | Status: DC
  Administered 2022-09-05 – 2022-09-06 (×2): 25 mg via ORAL
  Filled 2022-09-05 (×2): qty 1

## 2022-09-05 MED ORDER — LACTATED RINGERS IV SOLN: INTRAVENOUS | Status: DC

## 2022-09-05 MED ORDER — THROMBIN (RECOMBINANT) 5000 UNITS EX SOLR
CUTANEOUS | Status: DC | PRN
  Administered 2022-09-05: 10 mL via TOPICAL

## 2022-09-05 MED ORDER — INSULIN ASPART 100 UNIT/ML IJ SOLN: 0.0000 [IU] | INTRAMUSCULAR | Status: DC | PRN

## 2022-09-05 MED ORDER — PROPOFOL 10 MG/ML IV BOLUS
INTRAVENOUS | Status: AC
  Filled 2022-09-05: qty 20

## 2022-09-05 MED ORDER — 0.9 % SODIUM CHLORIDE (POUR BTL) OPTIME
TOPICAL | Status: DC | PRN
  Administered 2022-09-05: 1000 mL

## 2022-09-05 MED ORDER — CYCLOBENZAPRINE HCL 10 MG PO TABS
10.0000 mg | ORAL_TABLET | Freq: Three times a day (TID) | ORAL | Status: DC | PRN
  Administered 2022-09-05 – 2022-09-06 (×2): 10 mg via ORAL
  Filled 2022-09-05 (×2): qty 1

## 2022-09-05 MED ORDER — CEFAZOLIN SODIUM-DEXTROSE 2-4 GM/100ML-% IV SOLN
2.0000 g | INTRAVENOUS | Status: AC
  Administered 2022-09-05: 2 g via INTRAVENOUS

## 2022-09-05 MED ORDER — AMISULPRIDE (ANTIEMETIC) 5 MG/2ML IV SOLN: 10.0000 mg | Freq: Once | INTRAVENOUS | Status: DC | PRN

## 2022-09-05 SURGICAL SUPPLY — 57 items
ADH SKN CLS APL DERMABOND .7 (GAUZE/BANDAGES/DRESSINGS) ×1
APL SKNCLS STERI-STRIP NONHPOA (GAUZE/BANDAGES/DRESSINGS) ×1
BAG COUNTER SPONGE SURGICOUNT (BAG) ×2 IMPLANT
BAG DECANTER FOR FLEXI CONT (MISCELLANEOUS) ×2 IMPLANT
BAG SPNG CNTER NS LX DISP (BAG) ×2
BAND INSRT 18 STRL LF DISP RB (MISCELLANEOUS) ×2
BAND RUBBER #18 3X1/16 STRL (MISCELLANEOUS) ×4 IMPLANT
BENZOIN TINCTURE PRP APPL 2/3 (GAUZE/BANDAGES/DRESSINGS) ×2 IMPLANT
BIT DRILL 13 (BIT) IMPLANT
BUR MATCHSTICK NEURO 3.0 LAGG (BURR) ×2 IMPLANT
CAGE PEEK 6X14X11 (Cage) ×1 IMPLANT
CANISTER SUCT 3000ML PPV (MISCELLANEOUS) ×2 IMPLANT
DERMABOND ADVANCED .7 DNX12 (GAUZE/BANDAGES/DRESSINGS) IMPLANT
DRAPE C-ARM 42X72 X-RAY (DRAPES) ×4 IMPLANT
DRAPE LAPAROTOMY 100X72 PEDS (DRAPES) ×2 IMPLANT
DRAPE MICROSCOPE SLANT 54X150 (MISCELLANEOUS) ×2 IMPLANT
DURAPREP 6ML APPLICATOR 50/CS (WOUND CARE) ×2 IMPLANT
ELECT COATED BLADE 2.86 ST (ELECTRODE) ×2 IMPLANT
ELECT REM PT RETURN 9FT ADLT (ELECTROSURGICAL) ×1
ELECTRODE REM PT RTRN 9FT ADLT (ELECTROSURGICAL) ×2 IMPLANT
GAUZE 4X4 16PLY ~~LOC~~+RFID DBL (SPONGE) IMPLANT
GAUZE SPONGE 4X4 12PLY STRL (GAUZE/BANDAGES/DRESSINGS) ×2 IMPLANT
GLOVE BIOGEL PI IND STRL 7.0 (GLOVE) IMPLANT
GLOVE ECLIPSE 9.0 STRL (GLOVE) ×2 IMPLANT
GLOVE EXAM NITRILE XL STR (GLOVE) IMPLANT
GOWN STRL REUS W/ TWL LRG LVL3 (GOWN DISPOSABLE) IMPLANT
GOWN STRL REUS W/ TWL XL LVL3 (GOWN DISPOSABLE) IMPLANT
GOWN STRL REUS W/TWL 2XL LVL3 (GOWN DISPOSABLE) IMPLANT
GOWN STRL REUS W/TWL LRG LVL3 (GOWN DISPOSABLE) ×1
GOWN STRL REUS W/TWL XL LVL3 (GOWN DISPOSABLE) ×2
HALTER HD/CHIN CERV TRACTION D (MISCELLANEOUS) ×2 IMPLANT
HEMOSTAT POWDER KIT SURGIFOAM (HEMOSTASIS) IMPLANT
KIT BASIN OR (CUSTOM PROCEDURE TRAY) ×2 IMPLANT
KIT TURNOVER KIT B (KITS) ×2 IMPLANT
NDL SPNL 20GX3.5 QUINCKE YW (NEEDLE) ×2 IMPLANT
NEEDLE SPNL 20GX3.5 QUINCKE YW (NEEDLE) ×1 IMPLANT
NS IRRIG 1000ML POUR BTL (IV SOLUTION) ×2 IMPLANT
PACK LAMINECTOMY NEURO (CUSTOM PROCEDURE TRAY) ×2 IMPLANT
PAD ARMBOARD 7.5X6 YLW CONV (MISCELLANEOUS) ×6 IMPLANT
PLATE 23MM (Plate) IMPLANT
SCREW ST 13X4XST VA NS SPNE (Screw) IMPLANT
SCREW ST VAR 4 ATL (Screw) ×4 IMPLANT
SOL ELECTROSURG ANTI STICK (MISCELLANEOUS) ×1
SOLUTION ELECTROSURG ANTI STCK (MISCELLANEOUS) ×2 IMPLANT
SPACER SPNL 11X14X6XPEEK CVD (Cage) IMPLANT
SPCR SPNL 11X14X6XPEEK CVD (Cage) ×1 IMPLANT
SPONGE INTESTINAL PEANUT (DISPOSABLE) ×2 IMPLANT
SPONGE SURGIFOAM ABS GEL SZ50 (HEMOSTASIS) ×2 IMPLANT
STRIP CLOSURE SKIN 1/2X4 (GAUZE/BANDAGES/DRESSINGS) ×2 IMPLANT
SUT VIC AB 3-0 SH 8-18 (SUTURE) ×2 IMPLANT
SUT VIC AB 4-0 RB1 18 (SUTURE) ×2 IMPLANT
TAPE CLOTH 4X10 WHT NS (GAUZE/BANDAGES/DRESSINGS) ×2 IMPLANT
TAPE CLOTH SURG 4X10 WHT LF (GAUZE/BANDAGES/DRESSINGS) IMPLANT
TOWEL GREEN STERILE (TOWEL DISPOSABLE) ×2 IMPLANT
TOWEL GREEN STERILE FF (TOWEL DISPOSABLE) ×2 IMPLANT
TRAP SPECIMEN MUCUS 40CC (MISCELLANEOUS) ×2 IMPLANT
WATER STERILE IRR 1000ML POUR (IV SOLUTION) ×2 IMPLANT

## 2022-09-05 NOTE — H&P (Signed)
Riley Clarke is an 67 y.o. male.   Chief Complaint: Neck pain  HPI: 67 year old male with a remote history of a C5-6 anterior cervical discectomy presents now with worsening neck pain with bilateral upper extremity numbness paresthesias and some early weakness with some mild gait instability.  Workup demonstrates evidence of progressive degeneration with central disc herniation and stenosis at C3-4.  Patient has failed conservative management presents now for C3-4 anterior cervical discectomy and fusion in hopes improving his symptoms.  Past Medical History:  Diagnosis Date   Arthritis    Chronic back pain    on chronic opioids   Diabetes mellitus without complication (HCC)    GERD (gastroesophageal reflux disease)    Headache(784.0)    HLD (hyperlipidemia)    Hypertension    no meds   Vitamin D deficiency     Past Surgical History:  Procedure Laterality Date   CERVICAL DISC SURGERY  99   LUMBAR FUSION     LUMBAR LAMINECTOMY/DECOMPRESSION MICRODISCECTOMY Bilateral 03/09/2021   Procedure: Laminectomy and Foraminotomy - bilateral - Lumbar two-Lumbar three;  Surgeon: Earnie Larsson, MD;  Location: [facility];  Service: Neurosurgery;  Laterality: Bilateral;   ROTATOR CUFF REPAIR Bilateral    SHOULDER ARTHROSCOPY WITH ROTATOR CUFF REPAIR AND SUBACROMIAL DECOMPRESSION Right 12/30/2018   Procedure: SHOULDER ARTHROSCOPY WITH ROTATOR CUFF REPAIR AND SUBACROMIAL DECOMPRESSION, DISTAL CLAVICLE EXICISION,;  Surgeon: Hiram Gash, MD;  Location: [place];  Service: Orthopedics;  Laterality: Right;   SHOULDER ARTHROSCOPY WITH SUBACROMIAL DECOMPRESSION, ROTATOR CUFF REPAIR AND BICEP TENDON REPAIR Left 06/19/2019   Procedure: LEFT SHOULDER ARTHROSCOPY, EXTENTSIVE DEBRIDEMENT, DISTAL CLAVICULECTOMY, SUBACROMIAL DECOMPRESSION, PARTIAL ACROMIOPLASTY WITH CORACOACROMIAL RELEASE, ROTATOR CUFFF REPAIR AND BICEP TENODESIS;  Surgeon: Hiram Gash, MD;  Location: [place];  Service:  Orthopedics;  Laterality: Left;    Family History  Problem Relation Age of Onset   Heart disease Mother    Diabetes Mother    Amblyopia Neg Hx    Blindness Neg Hx    Cataracts Neg Hx    Hypertension Neg Hx    Macular degeneration Neg Hx    Retinal detachment Neg Hx    Strabismus Neg Hx    Esophageal cancer Neg Hx    Colon cancer Neg Hx    Pancreatic cancer Neg Hx    Ulcerative colitis Neg Hx    Inflammatory bowel disease Neg Hx    Liver disease Neg Hx    Rectal cancer Neg Hx    Stomach cancer Neg Hx    Social History:  reports that he quit smoking about 2 years ago. His smoking use included cigarettes. He has a 6.25 pack-year smoking history. He has never used smokeless tobacco. He reports current alcohol use of about 4.0 standard drinks of alcohol per week. He reports that he does not use drugs.  Allergies:  Allergies  Allergen Reactions   Cymbalta [Duloxetine Hcl] Nausea And Vomiting and Other (See Comments)    Dizziness/headaches   Shrimp [Shellfish Allergy]     intolerant   Valium [Diazepam] Itching    Medications Prior to Admission  Medication Sig Dispense Refill   esomeprazole (NEXIUM) 40 MG capsule TAKE ONE (1) CAPSULE BY MOUTH EVERY MORNING BEFORE BREAKFAST (Patient taking differently: Take 40 mg by mouth daily.) 90 capsule 1   gabapentin (NEURONTIN) 300 MG capsule TAKE 1 CAPSULE(300 MG) BY MOUTH THREE TIMES DAILY 270 capsule 3   JARDIANCE 25 MG TABS tablet TAKE 1 TABLET(25 MG) BY MOUTH  DAILY 90 tablet 3   metFORMIN (GLUCOPHAGE) 1000 MG tablet TAKE 1 TABLET(1000 MG) BY MOUTH TWICE DAILY WITH A MEAL 180 tablet 3   oxyCODONE-acetaminophen (PERCOCET) 10-325 MG tablet Take 1 tablet by mouth 4 (four) times daily as needed for pain.     simvastatin (ZOCOR) 10 MG tablet TAKE 1 TABLET(10 MG) BY MOUTH DAILY 90 tablet 1   TRADJENTA 5 MG TABS tablet TAKE 1 TABLET(5 MG) BY MOUTH DAILY 90 tablet 3   TRULICITY 3 0000000 SOPN Take 3 mg by mouth every Monday.     amLODipine  (NORVASC) 10 MG tablet TAKE 1 TABLET(10 MG) BY MOUTH DAILY 90 tablet 0   benzonatate (TESSALON) 200 MG capsule Take 1 capsule (200 mg total) by mouth 2 (two) times daily as needed for cough. (Patient not taking: Reported on 06/28/2022) 20 capsule 0   cetirizine (ZYRTEC) 10 MG tablet Take 1 tablet (10 mg total) by mouth daily. (Patient not taking: Reported on 08/29/2022) 30 tablet 11   Continuous Blood Gluc Receiver (FREESTYLE LIBRE 2 READER) DEVI 1 each by Does not apply route in the morning, at noon, and at bedtime. 1 each 2   cyclobenzaprine (FLEXERIL) 10 MG tablet Take 1 tablet (10 mg total) by mouth 3 (three) times daily as needed for muscle spasms. (Patient not taking: Reported on 08/29/2022) 30 tablet 0   finasteride (PROSCAR) 5 MG tablet TAKE 1 TABLET(5 MG) BY MOUTH DAILY (Patient not taking: Reported on 08/29/2022) 90 tablet 1   glucose blood (ACCU-CHEK AVIVA) test strip Use as instructed 100 each 12   lisinopril-hydrochlorothiazide (ZESTORETIC) 20-25 MG tablet Take 1 tablet by mouth daily. (Patient not taking: Reported on 08/29/2022) 90 tablet 3   TRULICITY 1.5 0000000 SOPN ADMINISTER 1.5 MG UNDER THE SKIN EVERY MONDAY (Patient not taking: Reported on 08/29/2022) 6 mL 3    Results for orders placed or performed during the hospital encounter of 09/05/22 (from the past 48 hour(s))  Glucose, capillary     Status: Abnormal   Collection Time: 09/05/22  8:57 AM  Result Value Ref Range   Glucose-Capillary 137 (H) 70 - 99 mg/dL    Comment: Glucose reference range applies only to samples taken after fasting for at least 8 hours.   No results found.  Pertinent items noted in HPI and remainder of comprehensive ROS otherwise negative.  Blood pressure 135/73, pulse 90, temperature 98.1 F (36.7 C), temperature source Oral, resp. rate 18, height 6' (1.829 m), weight 98 kg, SpO2 100 %.  Patient is awake and alert.  Oriented and appropriate.  Speech is fluent.  Judgment insight are intact.  Cranial  nerve function normal bilaterally motor examination reveals mild weakness of intrinsic and grip function bilaterally.  Tone is slightly increased in both lower extremities.  Gait is somewhat ataxic.  Examination head ears eyes nose and throat is unremarked.  Chest and abdomen are benign.  Extremities are free from injury or deformity. 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  Plan C3-4 anterior cervical discectomy and fusion.  Risks and benefits been explained.  Patient wishes to proceed.  Cooper Render Yaeli Hartung 09/05/2022, 10:56 AM

## 2022-09-05 NOTE — Anesthesia Procedure Notes (Signed)
Procedure Name: Intubation Date/Time: 09/05/2022 11:45 AM  Performed by: Eligha Bridegroom, CRNAPre-anesthesia Checklist: Patient identified, Emergency Drugs available, Suction available, Patient being monitored and Timeout performed Patient Re-evaluated:Patient Re-evaluated prior to induction Oxygen Delivery Method: Circle system utilized Preoxygenation: Pre-oxygenation with 100% oxygen Induction Type: IV induction Ventilation: Mask ventilation without difficulty and Oral airway inserted - appropriate to patient size Grade View: Grade II Tube type: Oral Tube size: 7.5 mm Number of attempts: 1 Airway Equipment and Method: Stylet Placement Confirmation: ETT inserted through vocal cords under direct vision, positive ETCO2 and breath sounds checked- equal and bilateral Secured at: 22 cm Tube secured with: Tape Dental Injury: Teeth and Oropharynx as per pre-operative assessment

## 2022-09-05 NOTE — Op Note (Signed)
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  Asst. Surgeon: Marcello Moores, MD  Anesthesia: General  Indication: 67 year old male with worsening neck and bilateral upper extremity symptoms consistent with early cervical myelopathy.  Workup demonstrates evidence of broad-based disc protrusion with cord compression and some early signal abnormality at C3-4.  Patient presents now for C3-4 anterior cervical discectomy and fusion  Operative note: After induction of anesthesia, patient positioned supine with neck slightly extended and held placeholder traction.  Patient's anterior cervical region prepped and draped sterilely.  Incision made overlying C3-4.  Dissection performed of the right.  Retractor placed.  Fluoroscopy used.  Levels confirmed.  Disc base incised.  Discectomy performed using various instruments down to the level of the posterior annulus.  Microscope was then brought into the field used throughout the remainder of the discectomy.  Remaining aspects of annulus and osteophytes were removed using a high-speed drill down to the level of the posterior longitudinal ligament.  Posterior logical was then elevated and resected in piecemeal fashion.  Underlying thecal sac was identified.  A wide central decompression was then performed undercutting the bodies of C3 and C4.  Decompression then proceeded to each neural foramina.  Wide anterior foraminotomies performed on the course exiting C4 nerve roots bilaterally.  At this point a very thorough decompression had been achieved.  There was no evidence of injury to the thecal sac or nerve roots.  Wound was irrigated.   Hemostasis was achieved with Gelfoam which was then removed.  6 mm Medtronic anatomic peek cage was packed with locally harvested autograft.  This was then impacted in the place and her size slightly from the anterior cortical margin.  23 mm Atlantis anterior cervical plate was then placed over the C3 and C4 levels.  This then attached under fluoroscopic guidance using 13 mm variable angle screws to each at both levels.  All screws given final tightening found to be solidly within the bone.  Locking screws engaged both levels.  Final images reveal good position of the cages and the hardware at the proper operative level with normal alignment of the spine.  Wound was then irrigated.  Hemostasis was assured with bipolar cautery.  Wounds and closed in layers with Vicryl sutures.  Steri-Strips and sterile dressing were applied.  No apparent complications.  Patient tolerated the procedure well and he returns to the recovery room postop.

## 2022-09-05 NOTE — Progress Notes (Signed)
Orthopedic Tech Progress Note Patient Details:  Riley Clarke 04-25-1956 [id number]  Patient has on soft collar   Patient ID: Riley Clarke, male   DOB: Apr 10, 1956, 66 y.o.   MRN: [id number]  [name] 09/05/2022, 3:45 PM

## 2022-09-05 NOTE — Brief Op Note (Signed)
09/05/2022  1:02 PM  PATIENT:  Riley Clarke  67 y.o. male  PRE-OPERATIVE DIAGNOSIS:  Spondylosis - myelopathy  POST-OPERATIVE DIAGNOSIS:  Spondylosis - myelopathy  PROCEDURE:  Procedure(s): Anterior Cervical Decompression Fusion  - Cervical three-Cervical four (N/A)  SURGEON:  Surgeon(s) and Role:    * Earnie Larsson, MD - Primary    * Vallarie Mare, MD - Assisting  PHYSICIAN ASSISTANT:   ASSISTANTS:    ANESTHESIA:   general  EBL:  10 mL   BLOOD ADMINISTERED:none  DRAINS: none   LOCAL MEDICATIONS USED:  NONE  SPECIMEN:  No Specimen  DISPOSITION OF SPECIMEN:  N/A  COUNTS:  YES  TOURNIQUET:  * No tourniquets in log *  DICTATION: .Dragon Dictation  PLAN OF CARE: Admit for overnight observation  PATIENT DISPOSITION:  PACU - hemodynamically stable.   Delay start of Pharmacological VTE agent (>24hrs) due to surgical blood loss or risk of bleeding: yes

## 2022-09-05 NOTE — Anesthesia Preprocedure Evaluation (Addendum)
Anesthesia Evaluation  Patient identified by MRN, date of birth, ID band Patient awake    Reviewed: Allergy & Precautions, NPO status , Patient's Chart, lab work & pertinent test results  Airway Mallampati: II  TM Distance: >3 FB Neck ROM: Full    Dental no notable dental hx. (+) Edentulous Upper, Poor Dentition, Missing, Dental Advisory Given,    Pulmonary former smoker   Pulmonary exam normal breath sounds clear to auscultation       Cardiovascular hypertension, Pt. on medications Normal cardiovascular exam Rhythm:Regular Rate:Normal     Neuro/Psych  Headaches  negative psych ROS   GI/Hepatic Neg liver ROS,GERD  Medicated and Controlled,,  Endo/Other  diabetes, Type 2, Oral Hypoglycemic Agents    Renal/GU negative Renal ROS     Musculoskeletal  (+) Arthritis ,    Abdominal   Peds  Hematology negative hematology ROS (+)   Anesthesia Other Findings   Reproductive/Obstetrics                             Anesthesia Physical Anesthesia Plan  ASA: 3  Anesthesia Plan: General   Post-op Pain Management: Tylenol PO (pre-op)*, Gabapentin PO (pre-op)* and Ketamine IV*   Induction: Intravenous  PONV Risk Score and Plan: 2 and Midazolam, Dexamethasone, Ondansetron and Treatment may vary due to age or medical condition  Airway Management Planned: Oral ETT and Video Laryngoscope Planned  Additional Equipment: ClearSight  Intra-op Plan:   Post-operative Plan: Extubation in OR  Informed Consent: I have reviewed the patients History and Physical, chart, labs and discussed the procedure including the risks, benefits and alternatives for the proposed anesthesia with the patient or authorized representative who has indicated his/her understanding and acceptance.     Dental advisory given  Plan Discussed with: CRNA  Anesthesia Plan Comments:         Anesthesia Quick Evaluation

## 2022-09-05 NOTE — Transfer of Care (Signed)
Immediate Anesthesia Transfer of Care Note  Patient: Riley Clarke  Procedure(s) Performed: Anterior Cervical Decompression Fusion  - Cervical three-Cervical four  Patient Location: PACU  Anesthesia Type:General  Level of Consciousness: awake, alert , and oriented  Airway & Oxygen Therapy: Patient Spontanous Breathing  Post-op Assessment: Report given to RN and Post -op Vital signs reviewed and stable  Post vital signs: Reviewed and stable  Last Vitals:  Vitals Value Taken Time  BP 155/87 09/05/22 1317  Temp    Pulse 90 09/05/22 1318  Resp 17 09/05/22 1318  SpO2 96 % 09/05/22 1318  Vitals shown include unvalidated device data.  Last Pain:  Vitals:   09/05/22 0926  TempSrc:   PainSc: 7          Complications: No notable events documented.

## 2022-09-06 ENCOUNTER — Encounter (HOSPITAL_COMMUNITY): Payer: Self-pay | Admitting: Neurosurgery

## 2022-09-06 ENCOUNTER — Encounter: Payer: Self-pay | Admitting: Nurse Practitioner

## 2022-09-06 DIAGNOSIS — M5001 Cervical disc disorder with myelopathy,  high cervical region: Secondary | ICD-10-CM | POA: Diagnosis not present

## 2022-09-06 LAB — GLUCOSE, CAPILLARY: Glucose-Capillary: 164 mg/dL — ABNORMAL HIGH (ref 70–99)

## 2022-09-06 MED FILL — Thrombin For Soln 5000 Unit: CUTANEOUS | Qty: 2 | Status: AC

## 2022-09-06 NOTE — Discharge Summary (Signed)
Physician Discharge Summary  Patient ID: Riley Clarke MRN: [id number] DOB/AGE: 1956/04/21 67 y.o.  Admit date: 09/05/2022 Discharge date: 09/06/2022  Admission Diagnoses:  Discharge Diagnoses:  Principal Problem:   Cervical spondylosis with myelopathy and radiculopathy   Discharged Condition: good  Hospital Course: Patient admitted to the hospital where he underwent uncomplicated anterior cervical decompression and fusion surgery.  Postoperative doing well.  Preoperative neck and upper extremity symptoms much improved.  Standing ambulating and voiding without difficulty.  Swallowing well.  Voice strong.  Ready for discharge home.  Consults:   Significant Diagnostic Studies:   Treatments:   Discharge Exam: Blood pressure 130/80, pulse 92, temperature 98.8 F (37.1 C), temperature source Oral, resp. rate 16, height 6' (1.829 m), weight 98 kg, SpO2 99 %. Awake and alert.  Oriented and appropriate.  Motor and sensory function intact.  Wound clean and dry.  Chest and abdomen benign.  Disposition: Discharge disposition: 01-Home or Self Care        Allergies as of 09/06/2022       Reactions   Cymbalta [duloxetine Hcl] Nausea And Vomiting, Other (See Comments)   Dizziness/headaches   Shrimp [shellfish Allergy]    intolerant   Valium [diazepam] Itching        Medication List     TAKE these medications    amLODipine 10 MG tablet Commonly known as: NORVASC TAKE 1 TABLET(10 MG) BY MOUTH DAILY   benzonatate 200 MG capsule Commonly known as: TESSALON Take 1 capsule (200 mg total) by mouth 2 (two) times daily as needed for cough.   cetirizine 10 MG tablet Commonly known as: ZYRTEC Take 1 tablet (10 mg total) by mouth daily.   cyclobenzaprine 10 MG tablet Commonly known as: FLEXERIL Take 1 tablet (10 mg total) by mouth 3 (three) times daily as needed for muscle spasms.   esomeprazole 40 MG capsule Commonly known as: NEXIUM TAKE ONE (1) CAPSULE BY MOUTH EVERY  MORNING BEFORE BREAKFAST What changed:  how much to take how to take this when to take this additional instructions   finasteride 5 MG tablet Commonly known as: PROSCAR TAKE 1 TABLET(5 MG) BY MOUTH DAILY   FreeStyle Libre 2 Reader Devi 1 each by Does not apply route in the morning, at noon, and at bedtime.   gabapentin 300 MG capsule Commonly known as: NEURONTIN TAKE 1 CAPSULE(300 MG) BY MOUTH THREE TIMES DAILY   glucose blood test strip Commonly known as: Accu-Chek Aviva Use as instructed   Jardiance 25 MG Tabs tablet Generic drug: empagliflozin TAKE 1 TABLET(25 MG) BY MOUTH DAILY   lisinopril-hydrochlorothiazide 20-25 MG tablet Commonly known as: ZESTORETIC Take 1 tablet by mouth daily.   metFORMIN 1000 MG tablet Commonly known as: GLUCOPHAGE TAKE 1 TABLET(1000 MG) BY MOUTH TWICE DAILY WITH A MEAL   oxyCODONE-acetaminophen 10-325 MG tablet Commonly known as: PERCOCET Take 1 tablet by mouth 4 (four) times daily as needed for pain.   simvastatin 10 MG tablet Commonly known as: ZOCOR TAKE 1 TABLET(10 MG) BY MOUTH DAILY   Tradjenta 5 MG Tabs tablet Generic drug: linagliptin TAKE 1 TABLET(5 MG) BY MOUTH DAILY   Trulicity 1.5 0000000 Sopn Generic drug: Dulaglutide ADMINISTER 1.5 MG UNDER THE SKIN EVERY MONDAY   Trulicity 3 0000000 Sopn Generic drug: Dulaglutide Take 3 mg by mouth every Monday.         SignedCooper Render Cristina Ceniceros 09/06/2022, 9:52 AM

## 2022-09-06 NOTE — Anesthesia Postprocedure Evaluation (Signed)
Anesthesia Post Note  Patient: Riley Clarke  Procedure(s) Performed: Anterior Cervical Decompression Fusion  - Cervical three-Cervical four     Patient location during evaluation: PACU Anesthesia Type: General Level of consciousness: sedated and patient cooperative Pain management: pain level controlled Vital Signs Assessment: post-procedure vital signs reviewed and stable Respiratory status: spontaneous breathing Cardiovascular status: stable Anesthetic complications: no   No notable events documented.  Last Vitals:  Vitals:   09/06/22 0446 09/06/22 0753  BP: 129/84 130/80  Pulse: 88 92  Resp: 20 16  Temp: 36.9 C 37.1 C  SpO2: 100% 99%    Last Pain:  Vitals:   09/06/22 0930  TempSrc:   PainSc: [place]

## 2022-09-06 NOTE — Discharge Instructions (Signed)

## 2022-09-06 NOTE — Evaluation (Signed)
Occupational Therapy Evaluation and Discharge Patient Details Name: Riley Clarke MRN: [id number] DOB: 10/29/1955 Today's Date: 09/06/2022   History of Present Illness Riley Clarke is a 67 yo male s/p C3-4 anterior cervical discectomy with interbody fusion due to worsening neck pain with bilateral upper extremity numbness paresthesias and some early weakness with some mild gait instability.  Workup demonstrates evidence of progressive degeneration with central disc herniation and stenosis at C3-4. PHMx:C5-6 anterior cervical discectomy, lumbar sxs, Bil rotator cuff surgeries, chronic back pain.   Clinical Impression   This 67 yo male admitted and underwent above presents to acute with all education provided and post op cervical surgery handout given. No further OT needs, we will sign off.      Recommendations for follow up therapy are one component of a multi-disciplinary discharge planning process, led by the attending physician.  Recommendations may be updated based on patient status, additional functional criteria and insurance authorization.   Assistance Recommended at Discharge PRN     Functional Status Assessment  Patient has not had a recent decline in their functional status  Equipment Recommendations  None recommended by OT       Precautions / Restrictions Precautions Precautions: Cervical Precaution Booklet Issued: Yes (comment) Required Braces or Orthoses: Cervical Brace Cervical Brace: Soft collar (order states "maintain soft collar") Restrictions Weight Bearing Restrictions: No      Mobility Bed Mobility               General bed mobility comments: Educated on rolling technique for in and OOB and to sleep the first several days either in recliner or propped up on several pillows in bed    Transfers                   General transfer comment: Pt not wanting to get up at present due to he feels like he is doing well without having to ambulate with  therapy or practice steps. Per nursing staff on unit he is ambulating without issues and no AD. I did see him come to his door and look out not long after I left his room--no balance issues noted.      Balance Overall balance assessment: Independent                                         ADL either performed or assessed with clinical judgement   ADL                                         General ADL Comments: Pt reports he does not foresee any issues with basic ADLs. Recommended he ask surgeon about getting incision wet and when/if he is allowed to have soft collar off. Educated on use of 2 cups for mouth care (one to spit and one with straw to rinse), use of cups with straws to avoid extending neck as he gets closer to last drops of drink, bringing feet up to him for bathing and dressing not bending over to his feet. He reports the surgeon told him he could use a sock with end cut off for collar front cover as replacement.     Vision Baseline Vision/History: 1 Wears glasses Patient Visual Report: No change from baseline  Pertinent Vitals/Pain Pain Assessment Pain Assessment: 0-10 Pain Score: 5  Pain Location: incisional Pain Descriptors / Indicators: Sore Pain Intervention(s): Limited activity within patient's tolerance     Hand Dominance Right   Extremity/Trunk Assessment Upper Extremity Assessment Upper Extremity Assessment: Overall WFL for tasks assessed           Communication Communication Communication: No difficulties   Cognition Arousal/Alertness: Awake/alert Behavior During Therapy: WFL for tasks assessed/performed Overall Cognitive Status: Within Functional Limits for tasks assessed                                                  Home Living Family/patient expects to be discharged to:: Private residence Living Arrangements: Other relatives Available Help at Discharge: Family;Available  PRN/intermittently Type of Home: House Home Access: Stairs to enter [employer] of Steps: 2 Entrance Stairs-Rails: Right Home Layout: One level     Bathroom Shower/Tub: Teacher, early years/pre: Handicapped height                Prior Functioning/Environment Prior Level of Function : Independent/Modified Independent                        OT Problem List: Pain         OT Goals(Current goals can be found in the care plan section) Acute Rehab OT Goals Patient Stated Goal: home today         AM-PAC OT "6 Clicks" Daily Activity     Outcome Measure Help from another person eating meals?: None Help from another person taking care of personal grooming?: None Help from another person toileting, which includes using toliet, bedpan, or urinal?: None Help from another person bathing (including washing, rinsing, drying)?: None Help from another person to put on and taking off regular upper body clothing?: None Help from another person to put on and taking off regular lower body clothing?: None 6 Click Score: 24   End of Session Equipment Utilized During Treatment: Cervical collar Nurse Communication:  (no follow up OT needs)  Activity Tolerance: Patient tolerated treatment well Patient left: in bed  OT Visit Diagnosis: Pain Pain - part of body:  (incisional)                TimeIV:[id number] OT Time Calculation (min): 11 min Charges:  OT General Charges $OT Visit: 1 Visit OT Evaluation $OT Eval Low Complexity: [place] Office [phone]    Almon Register 09/06/2022, 8:40 AM

## 2022-09-06 NOTE — Plan of Care (Signed)
Pt doing well. Pt given D/C instructions with verbal understanding. Incision is clean and dry with no sign of infection. Pt's IV was removed prior to D/C. Pt D/C'd home via wheelchair per MD order. Pt is stable @ D/C and has no other needs at this time. Kaeleen Odom, RN  

## 2022-09-10 ENCOUNTER — Emergency Department (HOSPITAL_COMMUNITY): Payer: 59

## 2022-09-10 ENCOUNTER — Emergency Department (HOSPITAL_COMMUNITY)
Admission: EM | Admit: 2022-09-10 | Discharge: 2022-09-10 | Disposition: A | Discharge status: Home / Self Care | Payer: 59 | Attending: Emergency Medicine | Admitting: Emergency Medicine

## 2022-09-10 ENCOUNTER — Other Ambulatory Visit: Payer: Self-pay

## 2022-09-10 DIAGNOSIS — S161XXA Strain of muscle, fascia and tendon at neck level, initial encounter: Secondary | ICD-10-CM | POA: Insufficient documentation

## 2022-09-10 DIAGNOSIS — W01198A Fall on same level from slipping, tripping and stumbling with subsequent striking against other object, initial encounter: Secondary | ICD-10-CM | POA: Insufficient documentation

## 2022-09-10 DIAGNOSIS — S199XXA Unspecified injury of neck, initial encounter: Secondary | ICD-10-CM | POA: Diagnosis present

## 2022-09-10 DIAGNOSIS — I10 Essential (primary) hypertension: Secondary | ICD-10-CM | POA: Diagnosis not present

## 2022-09-10 DIAGNOSIS — S0990XA Unspecified injury of head, initial encounter: Secondary | ICD-10-CM | POA: Insufficient documentation

## 2022-09-10 DIAGNOSIS — W19XXXA Unspecified fall, initial encounter: Secondary | ICD-10-CM

## 2022-09-10 DIAGNOSIS — E119 Type 2 diabetes mellitus without complications: Secondary | ICD-10-CM | POA: Insufficient documentation

## 2022-09-10 LAB — BASIC METABOLIC PANEL
Anion gap: 16 — ABNORMAL HIGH (ref 5–15)
BUN: 15 mg/dL (ref 8–23)
CO2: 20 mmol/L — ABNORMAL LOW (ref 22–32)
Calcium: 9.6 mg/dL (ref 8.9–10.3)
Chloride: 95 mmol/L — ABNORMAL LOW (ref 98–111)
Creatinine, Ser: 0.77 mg/dL (ref 0.61–1.24)
GFR, Estimated: >60 mL/min (ref >60)
Glucose, Bld: 182 mg/dL — ABNORMAL HIGH (ref 70–99)
Potassium: 3.6 mmol/L (ref 3.5–5.1)
Sodium: 131 mmol/L — ABNORMAL LOW (ref 135–145)

## 2022-09-10 LAB — CBC WITH DIFFERENTIAL/PLATELET
Abs Immature Granulocytes: 0.08 10*3/uL — ABNORMAL HIGH (ref 0.00–0.07)
Basophils Absolute: 0 10*3/uL (ref 0.0–0.1)
Basophils Relative: 0 %
Eosinophils Absolute: 0.1 10*3/uL (ref 0.0–0.5)
Eosinophils Relative: 1 %
HCT: 42.6 % (ref 39.0–52.0)
Hemoglobin: 13.5 g/dL (ref 13.0–17.0)
Immature Granulocytes: 1 %
Lymphocytes Relative: 22 %
Lymphs Abs: 2.2 10*3/uL (ref 0.7–4.0)
MCH: 26.7 pg (ref 26.0–34.0)
MCHC: 31.7 g/dL (ref 30.0–36.0)
MCV: 84.2 fL (ref 80.0–100.0)
Monocytes Absolute: 0.9 10*3/uL (ref 0.1–1.0)
Monocytes Relative: 9 %
Neutro Abs: 6.4 10*3/uL (ref 1.7–7.7)
Neutrophils Relative %: 67 %
Platelets: 248 10*3/uL (ref 150–400)
RBC: 5.06 MIL/uL (ref 4.22–5.81)
RDW: 14.9 % (ref 11.5–15.5)
WBC: 9.7 10*3/uL (ref 4.0–10.5)
nRBC: 0 % (ref 0.0–0.2)

## 2022-09-10 LAB — CBG MONITORING, ED: Glucose-Capillary: 229 mg/dL — ABNORMAL HIGH (ref 70–99)

## 2022-09-10 NOTE — Discharge Instructions (Signed)
Please continue to follow with your spine surgeon. Your CT scans looked normal. Return with any new or suddenly worsening symptoms.

## 2022-09-10 NOTE — ED Triage Notes (Signed)
Patient with recent neck surgery here for evaluation after he fell earlier today and hit his head. Denies LOC, is alert, oriented, and in no apparent distress at this time.

## 2022-09-10 NOTE — ED Provider Notes (Signed)
Emergency Department Provider Note   I have reviewed the triage vital signs and the nursing notes.   HISTORY  Chief Complaint Fall   HPI Riley Clarke is a 67 y.o. male presents to the ED with neck pain after a fall. He has neck surgery in the last several weeks and is concerned for possible damage to the site. Fall was mechanical with posterior head trauma. No new weakness or numbness. No LOC. No near syncope symptoms prior to fall.    Past Medical History:  Diagnosis Date   Arthritis    Chronic back pain    on chronic opioids   Diabetes mellitus without complication (HCC)    GERD (gastroesophageal reflux disease)    Headache(784.0)    HLD (hyperlipidemia)    Hypertension    no meds   Vitamin D deficiency     Review of Systems  Constitutional: No fever/chills Cardiovascular: Denies chest pain. Respiratory: Denies shortness of breath. Gastrointestinal: No abdominal pain.  No nausea, no vomiting.   Musculoskeletal: Negative for back pain. Positive neck pain.  Skin: Negative for rash. Neurological: Negative for headaches, focal weakness or numbness.  ____________________________________________   PHYSICAL EXAM:  VITAL SIGNS: ED Triage Vitals  Enc Vitals Group     BP 09/10/22 1810 (!) 159/87     Pulse Rate 09/10/22 1810 (!) 120     Resp 09/10/22 1810 18     Temp 09/10/22 1810 98.1 F (36.7 C)     Temp src --      SpO2 09/10/22 1810 92 %   Constitutional: Alert and oriented. Well appearing and in no acute distress. Eyes: Conjunctivae are normal.  Head: Atraumatic. Nose: No congestion/rhinnorhea. Mouth/Throat: Mucous membranes are moist.  Oropharynx non-erythematous. Neck: No stridor. No cervical spine tenderness to palpation. Steri-strips to the right anterior neck are well appearing.  Cardiovascular: Normal rate, regular rhythm. Good peripheral circulation. Grossly normal heart sounds.   Respiratory: Normal respiratory effort.  Gastrointestinal: No  distention.  Musculoskeletal: No lower extremity tenderness nor edema. No gross deformities of extremities. Neurologic:  Normal speech and language. No gross focal neurologic deficits are appreciated.  Skin:  Skin is warm, dry and intact. No rash noted.  ____________________________________________   LABS (all labs ordered are listed, but only abnormal results are displayed)  Labs Reviewed  BASIC METABOLIC PANEL - Abnormal; Notable for the following components:      Result Value   Sodium 131 (*)    Chloride 95 (*)    CO2 20 (*)    Glucose, Bld 182 (*)    Anion gap 16 (*)    All other components within normal limits  CBC WITH DIFFERENTIAL/PLATELET - Abnormal; Notable for the following components:   Abs Immature Granulocytes 0.08 (*)    All other components within normal limits  CBG MONITORING, ED - Abnormal; Notable for the following components:   Glucose-Capillary 229 (*)    All other components within normal limits   ____________________________________________  RADIOLOGY  CT Head Wo Contrast  Result Date: 09/10/2022 CLINICAL DATA:  Trauma EXAM: CT HEAD WITHOUT CONTRAST CT CERVICAL SPINE WITHOUT CONTRAST TECHNIQUE: Multidetector CT imaging of the head and cervical spine was performed following the standard protocol without intravenous contrast. Multiplanar CT image reconstructions of the cervical spine were also generated. RADIATION DOSE REDUCTION: This exam was performed according to the departmental dose-optimization program which includes automated exposure control, adjustment of the mA and/or kV according to patient size and/or use of iterative reconstruction  technique. COMPARISON:  None Available. FINDINGS: CT HEAD FINDINGS Brain: There is no mass, hemorrhage or extra-axial collection. The size and configuration of the ventricles and extra-axial CSF spaces are normal. The brain parenchyma is normal, without evidence of acute or chronic infarction. Vascular: No abnormal  hyperdensity of the major intracranial arteries or dural venous sinuses. No intracranial atherosclerosis. Skull: The visualized skull base, calvarium and extracranial soft tissues are normal. Sinuses/Orbits: No fluid levels or advanced mucosal thickening of the visualized paranasal sinuses. No mastoid or middle ear effusion. The orbits are normal. CT CERVICAL SPINE FINDINGS Alignment: No static subluxation. Facets are aligned. Occipital condyles are normally positioned. Skull base and vertebrae: No acute fracture. Soft tissues and spinal canal: Gas in the anterolateral right neck from recent C3-4 ACDF. Postsurgical soft tissue changes. Disc levels: No advanced spinal canal or neural foraminal stenosis. Upper chest: No pneumothorax, pulmonary nodule or pleural effusion. IMPRESSION: 1. No acute intracranial abnormality. 2. No acute fracture or static subluxation of the cervical spine. 3. Soft tissue changes in the anterolateral right neck from recent C3-4 ACDF. Electronically Signed   By: Ulyses Jarred M.D.   On: 09/10/2022 19:50   CT Cervical Spine Wo Contrast  Result Date: 09/10/2022 CLINICAL DATA:  Trauma EXAM: CT HEAD WITHOUT CONTRAST CT CERVICAL SPINE WITHOUT CONTRAST TECHNIQUE: Multidetector CT imaging of the head and cervical spine was performed following the standard protocol without intravenous contrast. Multiplanar CT image reconstructions of the cervical spine were also generated. RADIATION DOSE REDUCTION: This exam was performed according to the departmental dose-optimization program which includes automated exposure control, adjustment of the mA and/or kV according to patient size and/or use of iterative reconstruction technique. COMPARISON:  None Available. FINDINGS: CT HEAD FINDINGS Brain: There is no mass, hemorrhage or extra-axial collection. The size and configuration of the ventricles and extra-axial CSF spaces are normal. The brain parenchyma is normal, without evidence of acute or chronic  infarction. Vascular: No abnormal hyperdensity of the major intracranial arteries or dural venous sinuses. No intracranial atherosclerosis. Skull: The visualized skull base, calvarium and extracranial soft tissues are normal. Sinuses/Orbits: No fluid levels or advanced mucosal thickening of the visualized paranasal sinuses. No mastoid or middle ear effusion. The orbits are normal. CT CERVICAL SPINE FINDINGS Alignment: No static subluxation. Facets are aligned. Occipital condyles are normally positioned. Skull base and vertebrae: No acute fracture. Soft tissues and spinal canal: Gas in the anterolateral right neck from recent C3-4 ACDF. Postsurgical soft tissue changes. Disc levels: No advanced spinal canal or neural foraminal stenosis. Upper chest: No pneumothorax, pulmonary nodule or pleural effusion. IMPRESSION: 1. No acute intracranial abnormality. 2. No acute fracture or static subluxation of the cervical spine. 3. Soft tissue changes in the anterolateral right neck from recent C3-4 ACDF. Electronically Signed   By: Ulyses Jarred M.D.   On: 09/10/2022 19:50    ____________________________________________   PROCEDURES  Procedure(s) performed:   Procedures  none ____________________________________________   INITIAL IMPRESSION / ASSESSMENT AND PLAN / ED COURSE  Pertinent labs & imaging results that were available during my care of the patient were reviewed by me and considered in my medical decision making (see chart for details).   This patient is Presenting for Evaluation of neck pain, which does require a range of treatment options, and is a complaint that involves a high risk of morbidity and mortality.  The Differential Diagnoses includes subdural hematoma, epidural hematoma, acute concussion, traumatic subarachnoid hemorrhage, cerebral contusions, etc.   Radiologic Tests Ordered, included  CT head and c spine. I independently interpreted the images and agree with radiology  interpretation.   Medical Decision Making: Summary:  Patient presents to the ED with neck/head pain after fall. Reviewed op-note and post op course. Incision is very well appearing and no new symptoms to prompt MRI c spine.   Reevaluation with update and discussion with patient. CT c spine and head are reassuring. Plan for symptom mgmt and NSG follow up.   Patient's presentation is most consistent with acute, uncomplicated illness.   Disposition: discharge  ____________________________________________  FINAL CLINICAL IMPRESSION(S) / ED DIAGNOSES  Final diagnoses:  Fall, initial encounter  Injury of head, initial encounter  Strain of neck muscle, initial encounter   Note:  This document was prepared using Dragon voice recognition software and may include unintentional dictation errors.  Nanda Quinton, MD, [facility] Emergency Medicine    Carnelia Oscar, [facility], MD 09/13/22 [phone]

## 2022-09-10 NOTE — ED Provider Triage Note (Signed)
Emergency Medicine Provider Triage Evaluation Note  Riley Clarke , a 67 y.o. male  was evaluated in triage.  Pt complains of fall onset PTA. Pt with recent neck surgery.  He notes that he was bending down to pick something and when he stood up he fell backwards and hit his head.  Has associated head pain and neck pain.  No meds prior to arrival.  Denies back pain, chest pain, shortness of breath, abdominal pain, nausea, vomiting, dizziness, lightheadedness.  Review of Systems  Positive:  Negative:   Physical Exam  BP (!) 159/87 (BP Location: Right Arm)   Pulse (!) 120   Temp 98.1 F (36.7 C)   Resp 18   SpO2 92%  Gen:   Awake, no distress   Resp:  Normal effort  MSK:   Moves extremities without difficulty  Other:  No spinal tenderness to palpation.  Patient with home c-collar in place.  Patient able to ambulate without assistance or difficulty in the emergency department.  Medical Decision Making  Medically screening exam initiated at 6:11 PM.  Appropriate orders placed.  Riley Clarke was informed that the remainder of the evaluation will be completed by another provider, this initial triage assessment does not replace that evaluation, and the importance of remaining in the ED until their evaluation is complete.  Workup initiated   Riley Clarke A, PA-C 09/10/22 1813

## 2022-09-14 ENCOUNTER — Telehealth: Payer: Self-pay

## 2022-09-14 NOTE — Telephone Encounter (Signed)
     Patient  visit on 3/31  at [facility]    Have you been able to follow up with your primary care physician? Yes   The patient was or was not able to obtain any needed medicine or equipment. Yes   Are there diet recommendations that you are having difficulty following? Na   Patient expresses understanding of discharge instructions and education provided has no other needs at this time. Yes    [facility] [id number] [address], [place], [place] [address] Phone: [phone] Email: [email or web address]

## 2022-09-21 NOTE — Progress Notes (Addendum)
This encounter was created in error - please disregard.

## 2022-09-21 NOTE — Addendum Note (Signed)
Addended by: Tillie Rung on: 09/21/2022 07:46 AM   Modules accepted: Orders, Level of Service

## 2022-09-27 ENCOUNTER — Ambulatory Visit: Payer: 59 | Admitting: Nurse Practitioner

## 2022-09-27 ENCOUNTER — Ambulatory Visit: Payer: Self-pay | Admitting: Nurse Practitioner

## 2022-10-11 ENCOUNTER — Encounter: Payer: Self-pay | Admitting: Nurse Practitioner

## 2022-10-11 ENCOUNTER — Ambulatory Visit (INDEPENDENT_AMBULATORY_CARE_PROVIDER_SITE_OTHER): Payer: 59 | Admitting: Nurse Practitioner

## 2022-10-11 VITALS — BP 125/64 | HR 75 | Temp 97.1°F | Ht 72.0 in | Wt 222.8 lb

## 2022-10-11 DIAGNOSIS — Z23 Encounter for immunization: Secondary | ICD-10-CM | POA: Diagnosis not present

## 2022-10-11 DIAGNOSIS — E119 Type 2 diabetes mellitus without complications: Secondary | ICD-10-CM | POA: Diagnosis not present

## 2022-10-11 DIAGNOSIS — I152 Hypertension secondary to endocrine disorders: Secondary | ICD-10-CM | POA: Diagnosis not present

## 2022-10-11 MED ORDER — LISINOPRIL-HYDROCHLOROTHIAZIDE 20-25 MG PO TABS: 1.0000 | ORAL_TABLET | Freq: Every day | ORAL | 3 refills | Status: DC

## 2022-10-11 MED ORDER — TRULICITY 3 MG/0.5ML ~~LOC~~ SOAJ: 3.0000 mg | SUBCUTANEOUS | 1 refills | Status: DC

## 2022-10-11 NOTE — Patient Instructions (Addendum)
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  Dispense: 90 tablet; Refill: 3   2. Type 2 diabetes mellitus without complication, without long-term current use of insulin (HCC)  - TRULICITY 3 MG/0.5ML SOPN; Take 3 mg by mouth every Monday.  Dispense: 3 mL; Refill: 1 - POCT glycosylated hemoglobin (Hb A1C) - Ambulatory referral to Podiatry    3. Need for vaccination against Streptococcus pneumoniae  - Pneumococcal conjugate vaccine 13-valent IM    Follow up:  Follow up in 3 months

## 2022-10-11 NOTE — Progress Notes (Signed)
@Patient  ID: Riley Clarke, male    DOB: 08-15-1955, 67 y.o.   MRN: [id number]  Chief Complaint  Patient presents with   Diabetes    Three month follow up    Referring provider: Ivonne Andrew, NP   HPI   Riley Clarke 67 y.o. male  has a past medical history of Arthritis, Chronic back pain, Diabetes mellitus without complication (HCC), GERD (gastroesophageal reflux disease), Headache(784.0), HLD (hyperlipidemia), Hypertension, and Vitamin D deficiency. To the [facility] for reevaluation of HTN and DM2.     Diabetes Mellitus: Patient presents for follow up of diabetes. Symptoms: none. Denies any symptoms. Patient denies none.  Evaluation to date has been included: hemoglobin A1C.  Home sugars: patient does not check sugars. Treatment to date: no recent interventions. blood sugars ranging around 150. States that he is working on diet. A1C today is 8.1 - has been out of Trulicity.     Hypertension: Patient here for follow-up of elevated blood pressure. He is not exercising and is not adherent to low salt diet.  Blood pressure is not well controlled at home. Cardiac symptoms none. Patient denies none.  Cardiovascular risk factors: advanced age (older than 66 for men, 79 for women), diabetes mellitus, and hypertension. Use of agents associated with hypertension: none. History of target organ damage: none.   Checks B/P at home and its typically similar to today's value.  Denies any other concerns today.    Denies any fatigue, chest pain, shortness of breath, HA or dizziness. Denies any blurred vision, numbness or tingling.    Allergies  Allergen Reactions   Cymbalta [Duloxetine Hcl] Nausea And Vomiting and Other (See Comments)    Dizziness/headaches   Shrimp [Shellfish Allergy]     intolerant   Valium [Diazepam] Itching    Immunization History  Administered Date(s) Administered   Influenza,inj,Quad PF,6+ Mos 04/01/2015, 02/04/2016, 02/19/2017, 04/02/2018, 02/02/2021   Moderna  SARS-COV2 Booster Vaccination 05/04/2020   Moderna Sars-Covid-2 Vaccination 08/27/2019, 09/24/2019   Pneumococcal Conjugate-13 04/01/2015   Pneumococcal Polysaccharide-23 05/08/2016   Tdap 09/06/2014, 04/01/2015    Past Medical History:  Diagnosis Date   Arthritis    Chronic back pain    on chronic opioids   Diabetes mellitus without complication (HCC)    GERD (gastroesophageal reflux disease)    Headache(784.0)    HLD (hyperlipidemia)    Hypertension    no meds   Vitamin D deficiency     Tobacco History: Social History   Tobacco Use  Smoking Status Former   Packs/day: 0.25   Years: 25.00   Additional pack years: 0.00   Total pack years: 6.25   Types: Cigarettes   Quit date: 04/26/2020   Years since quitting: 2.4  Smokeless Tobacco Never   Counseling given: Not Answered   Outpatient Encounter Medications as of 10/11/2022  Medication Sig   amLODipine (NORVASC) 10 MG tablet TAKE 1 TABLET(10 MG) BY MOUTH DAILY   Continuous Blood Gluc Receiver (FREESTYLE LIBRE 2 READER) DEVI 1 each by Does not apply route in the morning, at noon, and at bedtime.   esomeprazole (NEXIUM) 40 MG capsule TAKE ONE (1) CAPSULE BY MOUTH EVERY MORNING BEFORE BREAKFAST (Patient taking differently: Take 40 mg by mouth daily.)   gabapentin (NEURONTIN) 300 MG capsule TAKE 1 CAPSULE(300 MG) BY MOUTH THREE TIMES DAILY   glucose blood (ACCU-CHEK AVIVA) test strip Use as instructed   JARDIANCE 25 MG TABS tablet TAKE 1 TABLET(25 MG) BY MOUTH DAILY   metFORMIN (GLUCOPHAGE)  1000 MG tablet TAKE 1 TABLET(1000 MG) BY MOUTH TWICE DAILY WITH A MEAL   oxyCODONE-acetaminophen (PERCOCET) 10-325 MG tablet Take 1 tablet by mouth 4 (four) times daily as needed for pain.   simvastatin (ZOCOR) 10 MG tablet TAKE 1 TABLET(10 MG) BY MOUTH DAILY   TRADJENTA 5 MG TABS tablet TAKE 1 TABLET(5 MG) BY MOUTH DAILY   [DISCONTINUED] lisinopril-hydrochlorothiazide (ZESTORETIC) 20-25 MG tablet Take 1 tablet by mouth daily.    [DISCONTINUED] TRULICITY 3 MG/0.5ML SOPN Take 3 mg by mouth every Monday.   benzonatate (TESSALON) 200 MG capsule Take 1 capsule (200 mg total) by mouth 2 (two) times daily as needed for cough. (Patient not taking: Reported on 06/28/2022)   cetirizine (ZYRTEC) 10 MG tablet Take 1 tablet (10 mg total) by mouth daily. (Patient not taking: Reported on 08/29/2022)   cyclobenzaprine (FLEXERIL) 10 MG tablet Take 1 tablet (10 mg total) by mouth 3 (three) times daily as needed for muscle spasms. (Patient not taking: Reported on 08/29/2022)   finasteride (PROSCAR) 5 MG tablet TAKE 1 TABLET(5 MG) BY MOUTH DAILY (Patient not taking: Reported on 08/29/2022)   lisinopril-hydrochlorothiazide (ZESTORETIC) 20-25 MG tablet Take 1 tablet by mouth daily.   [START ON 10/16/2022] TRULICITY 3 MG/0.5ML SOPN Take 3 mg by mouth every Monday.   [DISCONTINUED] TRULICITY 1.5 MG/0.5ML SOPN ADMINISTER 1.5 MG UNDER THE SKIN EVERY MONDAY (Patient not taking: Reported on 08/29/2022)   No facility-administered encounter medications on file as of 10/11/2022.     Review of Systems  Review of Systems  Constitutional: Negative.   HENT: Negative.    Cardiovascular: Negative.   Gastrointestinal: Negative.   Allergic/Immunologic: Negative.   Neurological: Negative.   Psychiatric/Behavioral: Negative.         Physical Exam  BP 125/64   Pulse 75   Temp (!) 97.1 F (36.2 C)   Ht 6' (1.829 m)   Wt 222 lb 12.8 oz (101.1 kg)   SpO2 100%   BMI 30.22 kg/m   Wt Readings from Last 5 Encounters:  10/11/22 222 lb 12.8 oz (101.1 kg)  09/05/22 216 lb (98 kg)  08/31/22 216 lb (98 kg)  06/28/22 225 lb (102.1 kg)  01/12/22 227 lb (103 kg)     Physical Exam Vitals and nursing note reviewed.  Constitutional:      General: He is not in acute distress.    Appearance: He is well-developed.  Cardiovascular:     Rate and Rhythm: Normal rate and regular rhythm.  Pulmonary:     Effort: Pulmonary effort is normal.     Breath sounds:  Normal breath sounds.  Skin:    General: Skin is warm and dry.  Neurological:     Mental Status: He is alert and oriented to person, place, and time.      Lab Results:  CBC    Component Value Date/Time   WBC 9.7 09/10/2022 1820   RBC 5.06 09/10/2022 1820   HGB 13.5 09/10/2022 1820   HGB 12.6 (L) 06/28/2022 1449   HCT 42.6 09/10/2022 1820   HCT 38.0 06/28/2022 1449   PLT 248 09/10/2022 1820   PLT 228 06/28/2022 1449   MCV 84.2 09/10/2022 1820   MCV 80 06/28/2022 1449   MCH 26.7 09/10/2022 1820   MCHC 31.7 09/10/2022 1820   RDW 14.9 09/10/2022 1820   RDW 14.1 06/28/2022 1449   LYMPHSABS 2.2 09/10/2022 1820   LYMPHSABS 1.4 07/25/2017 1537   MONOABS 0.9 09/10/2022 1820   EOSABS 0.1 09/10/2022  1820   EOSABS 0.2 07/25/2017 1537   BASOSABS 0.0 09/10/2022 1820   BASOSABS 0.0 07/25/2017 1537    BMET    Component Value Date/Time   NA 131 (L) 09/10/2022 1820   NA 138 06/28/2022 1449   K 3.6 09/10/2022 1820   CL 95 (L) 09/10/2022 1820   CO2 20 (L) 09/10/2022 1820   GLUCOSE 182 (H) 09/10/2022 1820   BUN 15 09/10/2022 1820   BUN 11 06/28/2022 1449   CREATININE 0.77 09/10/2022 1820   CREATININE 0.86 01/15/2017 1501   CALCIUM 9.6 09/10/2022 1820   GFRNONAA >60 09/10/2022 1820   GFRNONAA >89 01/15/2017 1501   GFRAA 105 07/26/2020 1117   GFRAA >89 01/15/2017 1501     Assessment & Plan:   Hypertension due to endocrine disorder - lisinopril-hydrochlorothiazide (ZESTORETIC) 20-25 MG tablet; Take 1 tablet by mouth daily.  Dispense: 90 tablet; Refill: 3   2. Type 2 diabetes mellitus without complication, without long-term current use of insulin (HCC)  - TRULICITY 3 MG/0.5ML SOPN; Take 3 mg by mouth every Monday.  Dispense: 3 mL; Refill: 1 - POCT glycosylated hemoglobin (Hb A1C) - Ambulatory referral to Podiatry   3. Need for vaccination against Streptococcus pneumoniae  - Pneumococcal conjugate vaccine 13-valent IM   Follow up:  Follow up in 3  months     Ivonne Andrew, NP 10/11/2022

## 2022-10-11 NOTE — Assessment & Plan Note (Signed)
-   lisinopril-hydrochlorothiazide (ZESTORETIC) 20-25 MG tablet; Take 1 tablet by mouth daily.  Dispense: 90 tablet; Refill: 3   2. Type 2 diabetes mellitus without complication, without long-term current use of insulin (HCC)  - TRULICITY 3 MG/0.5ML SOPN; Take 3 mg by mouth every Monday.  Dispense: 3 mL; Refill: 1 - POCT glycosylated hemoglobin (Hb A1C) - Ambulatory referral to Podiatry   3. Need for vaccination against Streptococcus pneumoniae  - Pneumococcal conjugate vaccine 13-valent IM   Follow up:  Follow up in 3 months

## 2022-10-12 ENCOUNTER — Ambulatory Visit (INDEPENDENT_AMBULATORY_CARE_PROVIDER_SITE_OTHER): Payer: 59 | Admitting: Podiatry

## 2022-10-12 ENCOUNTER — Encounter: Payer: Self-pay | Admitting: Podiatry

## 2022-10-12 DIAGNOSIS — M722 Plantar fascial fibromatosis: Secondary | ICD-10-CM | POA: Diagnosis not present

## 2022-10-12 MED ORDER — TRIAMCINOLONE ACETONIDE 10 MG/ML IJ SUSP
20.0000 mg | Freq: Once | INTRAMUSCULAR | Status: AC
  Administered 2022-10-12: 20 mg

## 2022-10-12 NOTE — Progress Notes (Signed)
Subjective:   Patient ID: Riley Clarke, male   DOB: 67 y.o.   MRN: [id number]   HPI Patient presents with painful heels bilateral stating that he had about 3 months of relief like usual   ROS      Objective:  Physical Exam  Neurovascular status intact intense discomfort plantar fascia insertion bilateral heels     Assessment:  Acute Planter fasciitis bilateral     Plan:  Sterile prep injected the plantar fascia bilateral 3 mg Kenalog 5 mg Xylocaine at the insertion tendon calcaneus applied sterile dressing reappoint for routine treatment as we do not think surgery would be of great benefit to this patient

## 2022-10-14 LAB — COLOGUARD: COLOGUARD: NEGATIVE

## 2022-10-19 LAB — POCT GLYCOSYLATED HEMOGLOBIN (HGB A1C): Hemoglobin A1C: 8.1 % — AB (ref 4.0–5.6)

## 2022-10-30 ENCOUNTER — Encounter: Payer: Self-pay | Admitting: Nurse Practitioner

## 2022-10-30 ENCOUNTER — Ambulatory Visit (INDEPENDENT_AMBULATORY_CARE_PROVIDER_SITE_OTHER): Payer: 59 | Admitting: Nurse Practitioner

## 2022-10-30 VITALS — BP 117/65 | HR 88 | Temp 97.2°F | Wt 216.8 lb

## 2022-10-30 DIAGNOSIS — K642 Third degree hemorrhoids: Secondary | ICD-10-CM | POA: Diagnosis not present

## 2022-10-30 DIAGNOSIS — E119 Type 2 diabetes mellitus without complications: Secondary | ICD-10-CM | POA: Diagnosis not present

## 2022-10-30 MED ORDER — TRULICITY 3 MG/0.5ML ~~LOC~~ SOAJ: 3.0000 mg | SUBCUTANEOUS | 1 refills | Status: DC

## 2022-10-30 NOTE — Progress Notes (Signed)
@Patient  ID: Riley Clarke, male    DOB: 08-20-1955, 68 y.o.   MRN: [id number]  Chief Complaint  Patient presents with   Hemorrhoids    Wants referral to GI    Referring provider: Ivonne Andrew, NP   HPI  Riley Clarke 67 y.o. male  has a past medical history of Arthritis, Chronic back pain, Diabetes mellitus without complication (HCC), GERD (gastroesophageal reflux disease), Headache(784.0), HLD (hyperlipidemia), Hypertension, and Vitamin D deficiency. To the [facility] for reevaluation of HTN and DM2.   Patient presents today requesting referral to GI for hemorrhoids.  He states that he has had this in the past.  He states that his hemorrhoids are thrombosed and he has had surgery in the past for this.  Patient declines suppository or cream for this today.  He declines exam today.  He states that he wants to wait and let GI examine him.  Patient is also requesting refill for Trulicity today.  Denies f/c/s, n/v/d, hemoptysis, PND, leg swelling Denies chest pain or edema        Allergies  Allergen Reactions   Cymbalta [Duloxetine Hcl] Nausea And Vomiting and Other (See Comments)    Dizziness/headaches   Shrimp [Shellfish Allergy]     intolerant   Valium [Diazepam] Itching    Immunization History  Administered Date(s) Administered   Influenza,inj,Quad PF,6+ Mos 04/01/2015, 02/04/2016, 02/19/2017, 04/02/2018, 02/02/2021   Moderna SARS-COV2 Booster Vaccination 05/04/2020   Moderna Sars-Covid-2 Vaccination 08/27/2019, 09/24/2019   Pneumococcal Conjugate-13 04/01/2015, 10/11/2022   Pneumococcal Polysaccharide-23 05/08/2016   Tdap 09/06/2014, 04/01/2015    Past Medical History:  Diagnosis Date   Arthritis    Chronic back pain    on chronic opioids   Diabetes mellitus without complication (HCC)    GERD (gastroesophageal reflux disease)    Headache(784.0)    HLD (hyperlipidemia)    Hypertension    no meds   Vitamin D deficiency     Tobacco History: Social  History   Tobacco Use  Smoking Status Former   Packs/day: 0.25   Years: 25.00   Additional pack years: 0.00   Total pack years: 6.25   Types: Cigarettes   Quit date: 04/26/2020   Years since quitting: 2.5  Smokeless Tobacco Never   Counseling given: Not Answered   Outpatient Encounter Medications as of 10/30/2022  Medication Sig   amLODipine (NORVASC) 10 MG tablet TAKE 1 TABLET(10 MG) BY MOUTH DAILY   Continuous Blood Gluc Receiver (FREESTYLE LIBRE 2 READER) DEVI 1 each by Does not apply route in the morning, at noon, and at bedtime.   esomeprazole (NEXIUM) 40 MG capsule TAKE ONE (1) CAPSULE BY MOUTH EVERY MORNING BEFORE BREAKFAST (Patient taking differently: Take 40 mg by mouth daily.)   gabapentin (NEURONTIN) 300 MG capsule TAKE 1 CAPSULE(300 MG) BY MOUTH THREE TIMES DAILY   glucose blood (ACCU-CHEK AVIVA) test strip Use as instructed   JARDIANCE 25 MG TABS tablet TAKE 1 TABLET(25 MG) BY MOUTH DAILY   lisinopril-hydrochlorothiazide (ZESTORETIC) 20-25 MG tablet Take 1 tablet by mouth daily.   metFORMIN (GLUCOPHAGE) 1000 MG tablet TAKE 1 TABLET(1000 MG) BY MOUTH TWICE DAILY WITH A MEAL   oxyCODONE-acetaminophen (PERCOCET) 10-325 MG tablet Take 1 tablet by mouth 4 (four) times daily as needed for pain.   simvastatin (ZOCOR) 10 MG tablet TAKE 1 TABLET(10 MG) BY MOUTH DAILY   TRADJENTA 5 MG TABS tablet TAKE 1 TABLET(5 MG) BY MOUTH DAILY   [DISCONTINUED] TRULICITY 3 MG/0.5ML SOPN Take 3  mg by mouth every Monday.   benzonatate (TESSALON) 200 MG capsule Take 1 capsule (200 mg total) by mouth 2 (two) times daily as needed for cough. (Patient not taking: Reported on 06/28/2022)   cetirizine (ZYRTEC) 10 MG tablet Take 1 tablet (10 mg total) by mouth daily. (Patient not taking: Reported on 08/29/2022)   cyclobenzaprine (FLEXERIL) 10 MG tablet Take 1 tablet (10 mg total) by mouth 3 (three) times daily as needed for muscle spasms. (Patient not taking: Reported on 08/29/2022)   finasteride (PROSCAR)  5 MG tablet TAKE 1 TABLET(5 MG) BY MOUTH DAILY (Patient not taking: Reported on 08/29/2022)   TRULICITY 3 MG/0.5ML SOPN Take 3 mg by mouth every Monday.   No facility-administered encounter medications on file as of 10/30/2022.     Review of Systems  Review of Systems  Constitutional: Negative.   HENT: Negative.    Cardiovascular: Negative.   Gastrointestinal: Negative.        Hemorrhoids   Allergic/Immunologic: Negative.   Neurological: Negative.   Psychiatric/Behavioral: Negative.         Physical Exam  BP 117/65   Pulse 88   Temp (!) 97.2 F (36.2 C)   Wt 216 lb 12.8 oz (98.3 kg)   SpO2 100%   BMI 29.40 kg/m   Wt Readings from Last 5 Encounters:  10/30/22 216 lb 12.8 oz (98.3 kg)  10/11/22 222 lb 12.8 oz (101.1 kg)  09/05/22 216 lb (98 kg)  08/31/22 216 lb (98 kg)  06/28/22 225 lb (102.1 kg)     Physical Exam Vitals and nursing note reviewed.  Constitutional:      General: He is not in acute distress.    Appearance: He is well-developed.  Cardiovascular:     Rate and Rhythm: Normal rate and regular rhythm.  Pulmonary:     Effort: Pulmonary effort is normal.     Breath sounds: Normal breath sounds.  Genitourinary:    Comments: Declined exam Skin:    General: Skin is warm and dry.  Neurological:     Mental Status: He is alert and oriented to person, place, and time.      Lab Results:  CBC    Component Value Date/Time   WBC 9.7 09/10/2022 1820   RBC 5.06 09/10/2022 1820   HGB 13.5 09/10/2022 1820   HGB 12.6 (L) 06/28/2022 1449   HCT 42.6 09/10/2022 1820   HCT 38.0 06/28/2022 1449   PLT 248 09/10/2022 1820   PLT 228 06/28/2022 1449   MCV 84.2 09/10/2022 1820   MCV 80 06/28/2022 1449   MCH 26.7 09/10/2022 1820   MCHC 31.7 09/10/2022 1820   RDW 14.9 09/10/2022 1820   RDW 14.1 06/28/2022 1449   LYMPHSABS 2.2 09/10/2022 1820   LYMPHSABS 1.4 07/25/2017 1537   MONOABS 0.9 09/10/2022 1820   EOSABS 0.1 09/10/2022 1820   EOSABS 0.2 07/25/2017  1537   BASOSABS 0.0 09/10/2022 1820   BASOSABS 0.0 07/25/2017 1537    BMET    Component Value Date/Time   NA 131 (L) 09/10/2022 1820   NA 138 06/28/2022 1449   K 3.6 09/10/2022 1820   CL 95 (L) 09/10/2022 1820   CO2 20 (L) 09/10/2022 1820   GLUCOSE 182 (H) 09/10/2022 1820   BUN 15 09/10/2022 1820   BUN 11 06/28/2022 1449   CREATININE 0.77 09/10/2022 1820   CREATININE 0.86 01/15/2017 1501   CALCIUM 9.6 09/10/2022 1820   GFRNONAA >60 09/10/2022 1820   GFRNONAA >89 01/15/2017 1501  GFRAA 105 07/26/2020 1117   GFRAA >89 01/15/2017 1501      Assessment & Plan:   Diabetes - TRULICITY 3 MG/0.5ML SOPN; Take 3 mg by mouth every Monday.  Dispense: 3 mL; Refill: 1   2. Grade III hemorrhoids  - Ambulatory referral to Gastroenterology   Follow up:  Follow up as scheduled     Ivonne Andrew, NP 10/30/2022

## 2022-10-30 NOTE — Patient Instructions (Signed)
1. Type 2 diabetes mellitus without complication, without long-term current use of insulin (HCC)  - TRULICITY 3 MG/0.5ML SOPN; Take 3 mg by mouth every Monday.  Dispense: 3 mL; Refill: 1   2. Grade III hemorrhoids  - Ambulatory referral to Gastroenterology   Follow up:  Follow up as scheduled

## 2022-10-30 NOTE — Assessment & Plan Note (Signed)
-   TRULICITY 3 MG/0.5ML SOPN; Take 3 mg by mouth every Monday.  Dispense: 3 mL; Refill: 1   2. Grade III hemorrhoids  - Ambulatory referral to Gastroenterology   Follow up:  Follow up as scheduled

## 2022-12-04 ENCOUNTER — Other Ambulatory Visit: Payer: Self-pay

## 2022-12-04 ENCOUNTER — Telehealth: Payer: Self-pay | Admitting: Nurse Practitioner

## 2022-12-04 DIAGNOSIS — K219 Gastro-esophageal reflux disease without esophagitis: Secondary | ICD-10-CM

## 2022-12-04 MED ORDER — SIMVASTATIN 10 MG PO TABS: ORAL_TABLET | ORAL | 1 refills | Status: DC

## 2022-12-04 MED ORDER — ESOMEPRAZOLE MAGNESIUM 40 MG PO CPDR: DELAYED_RELEASE_CAPSULE | ORAL | 1 refills | Status: DC

## 2022-12-04 MED ORDER — METFORMIN HCL 1000 MG PO TABS: ORAL_TABLET | ORAL | 1 refills | Status: DC

## 2022-12-04 NOTE — Telephone Encounter (Signed)
Caller & Relationship to patient:  MRN #  [id number]   Call Back Number:   Date of Last Office Visit: 10/30/2022     Date of Next Office Visit: 01/11/2023    Medication(s) to be Refilled: Esomeprazole,Metformin,Simvastatin   Preferred [facility] [address]  ** Please notify patient to allow 48-72 hours to process** **Let patient know to contact pharmacy at the end of the day to make sure medication is ready. ** **If patient has not been seen in a year or longer, book an appointment **Advise to use MyChart for refill requests OR to contact their pharmacy

## 2022-12-04 NOTE — Telephone Encounter (Signed)
Done KH 

## 2023-01-11 ENCOUNTER — Ambulatory Visit: Payer: Self-pay | Admitting: Nurse Practitioner

## 2023-01-12 ENCOUNTER — Ambulatory Visit: Payer: 59 | Admitting: Nurse Practitioner

## 2023-01-12 ENCOUNTER — Encounter: Payer: Self-pay | Admitting: Nurse Practitioner

## 2023-01-12 VITALS — BP 141/74 | HR 99 | Ht 72.0 in | Wt 221.0 lb

## 2023-01-12 DIAGNOSIS — E119 Type 2 diabetes mellitus without complications: Secondary | ICD-10-CM | POA: Diagnosis not present

## 2023-01-12 DIAGNOSIS — Z1211 Encounter for screening for malignant neoplasm of colon: Secondary | ICD-10-CM

## 2023-01-12 LAB — POCT GLYCOSYLATED HEMOGLOBIN (HGB A1C): HbA1c, POC (controlled diabetic range): 10.1 % — AB (ref 0.0–7.0)

## 2023-01-12 MED ORDER — TRULICITY 3 MG/0.5ML ~~LOC~~ SOAJ: 3.0000 mg | SUBCUTANEOUS | 1 refills | Status: DC

## 2023-01-12 NOTE — Assessment & Plan Note (Signed)
-   Cologuard  2. Type 2 diabetes mellitus without complication, without long-term current use of insulin (HCC)  - TRULICITY 3 MG/0.5ML SOPN; Take 3 mg by mouth every Monday.  Dispense: 3 mL; Refill: 1 - CBC - Comprehensive metabolic panel - AMB Referral to Pharmacy Medication Management - POCT glycosylated hemoglobin (Hb A1C)  Follow up:  Follow up in 3 months

## 2023-01-12 NOTE — Patient Instructions (Signed)
1. Colon cancer screening  - Cologuard  2. Type 2 diabetes mellitus without complication, without long-term current use of insulin (HCC)  - TRULICITY 3 MG/0.5ML SOPN; Take 3 mg by mouth every Monday.  Dispense: 3 mL; Refill: 1 - CBC - Comprehensive metabolic panel - AMB Referral to Pharmacy Medication Management - POCT glycosylated hemoglobin (Hb A1C)  Follow up:  Follow up in 3 months

## 2023-01-12 NOTE — Progress Notes (Signed)
@Patient  ID: Riley Clarke, male    DOB: [birth year], 67 y.o.   MRN: [id number]  Chief Complaint  Patient presents with   Medical Management of Chronic Issues    Referring provider: Ivonne Andrew, NP   HPI  Riley Clarke 67 y.o. male  has a past medical history of Arthritis, Chronic back pain, Diabetes mellitus without complication (HCC), GERD (gastroesophageal reflux disease), Headache(784.0), HLD (hyperlipidemia), Hypertension, and Vitamin D deficiency. To the [facility] for reevaluation of HTN and DM2.     Diabetes Mellitus: Patient presents for follow up of diabetes. Symptoms: none. Denies any symptoms. Patient denies none.  Evaluation to date has been included: hemoglobin A1C.  Home sugars: patient does not check sugars. Treatment to date: no recent interventions. blood sugars ranging around 150. States that he is working on diet. A1C today is 10.1.  Will consult with pharmacy for medication management.  Patient is noncompliant with diet.     Hypertension: Patient here for follow-up of elevated blood pressure. He is not exercising and is not adherent to low salt diet.  Blood pressure is not well controlled at home. Cardiac symptoms none. Patient denies none.  Cardiovascular risk factors: advanced age (older than 37 for men, 48 for women), diabetes mellitus, and hypertension. Use of agents associated with hypertension: none. History of target organ damage: none.   Checks B/P at home and its typically similar to today's value.  Denies any other concerns today.    Denies any fatigue, chest pain, shortness of breath, HA or dizziness. Denies any blurred vision, numbness or tingling.     Allergies  Allergen Reactions   Cymbalta [Duloxetine Hcl] Nausea And Vomiting and Other (See Comments)    Dizziness/headaches   Shrimp [Shellfish Allergy]     intolerant   Valium [Diazepam] Itching    Immunization History  Administered Date(s) Administered   Influenza,inj,Quad PF,6+ Mos  04/01/2015, 02/04/2016, 02/19/2017, 04/02/2018, 02/02/2021   Moderna SARS-COV2 Booster Vaccination 05/04/2020   Moderna Sars-Covid-2 Vaccination 08/27/2019, 09/24/2019   Pneumococcal Conjugate-13 04/01/2015, 10/11/2022   Pneumococcal Polysaccharide-23 05/08/2016   Tdap 09/06/2014, 04/01/2015    Past Medical History:  Diagnosis Date   Arthritis    Chronic back pain    on chronic opioids   Diabetes mellitus without complication (HCC)    GERD (gastroesophageal reflux disease)    Headache(784.0)    HLD (hyperlipidemia)    Hypertension    no meds   Vitamin D deficiency     Tobacco History: Social History   Tobacco Use  Smoking Status Former   Current packs/day: 0.00   Average packs/day: 0.3 packs/day for 25.0 years (6.3 ttl pk-yrs)   Types: Cigarettes   Start date: 04/27/1995   Quit date: 04/26/2020   Years since quitting: 2.7  Smokeless Tobacco Never   Counseling given: Not Answered   Outpatient Encounter Medications as of 01/12/2023  Medication Sig   amLODipine (NORVASC) 10 MG tablet TAKE 1 TABLET(10 MG) BY MOUTH DAILY   Continuous Blood Gluc Receiver (FREESTYLE LIBRE 2 READER) DEVI 1 each by Does not apply route in the morning, at noon, and at bedtime.   esomeprazole (NEXIUM) 40 MG capsule TAKE ONE (1) CAPSULE BY MOUTH EVERY MORNING BEFORE BREAKFAST   gabapentin (NEURONTIN) 300 MG capsule TAKE 1 CAPSULE(300 MG) BY MOUTH THREE TIMES DAILY   glucose blood (ACCU-CHEK AVIVA) test strip Use as instructed   JARDIANCE 25 MG TABS tablet TAKE 1 TABLET(25 MG) BY MOUTH DAILY   lisinopril-hydrochlorothiazide (ZESTORETIC) 20-25  MG tablet Take 1 tablet by mouth daily.   metFORMIN (GLUCOPHAGE) 1000 MG tablet TAKE 1 TABLET(1000 MG) BY MOUTH TWICE DAILY WITH A MEAL   oxyCODONE-acetaminophen (PERCOCET) 10-325 MG tablet Take 1 tablet by mouth 4 (four) times daily as needed for pain.   simvastatin (ZOCOR) 10 MG tablet TAKE 1 TABLET(10 MG) BY MOUTH DAILY   TRADJENTA 5 MG TABS tablet TAKE 1  TABLET(5 MG) BY MOUTH DAILY   [DISCONTINUED] TRULICITY 3 MG/0.5ML SOPN Take 3 mg by mouth every Monday.   benzonatate (TESSALON) 200 MG capsule Take 1 capsule (200 mg total) by mouth 2 (two) times daily as needed for cough. (Patient not taking: Reported on 06/28/2022)   cetirizine (ZYRTEC) 10 MG tablet Take 1 tablet (10 mg total) by mouth daily. (Patient not taking: Reported on 08/29/2022)   cyclobenzaprine (FLEXERIL) 10 MG tablet Take 1 tablet (10 mg total) by mouth 3 (three) times daily as needed for muscle spasms. (Patient not taking: Reported on 08/29/2022)   finasteride (PROSCAR) 5 MG tablet TAKE 1 TABLET(5 MG) BY MOUTH DAILY (Patient not taking: Reported on 08/29/2022)   [START ON 01/15/2023] TRULICITY 3 MG/0.5ML SOPN Take 3 mg by mouth every Monday.   No facility-administered encounter medications on file as of 01/12/2023.     Review of Systems  Review of Systems  Constitutional: Negative.   HENT: Negative.    Cardiovascular: Negative.   Gastrointestinal: Negative.   Allergic/Immunologic: Negative.   Neurological: Negative.   Psychiatric/Behavioral: Negative.         Physical Exam  BP (!) 141/74   Pulse 99   Ht 6' (1.829 m)   Wt 221 lb (100.2 kg)   SpO2 99%   BMI 29.97 kg/m   Wt Readings from Last 5 Encounters:  01/12/23 221 lb (100.2 kg)  10/30/22 216 lb 12.8 oz (98.3 kg)  10/11/22 222 lb 12.8 oz (101.1 kg)  09/05/22 216 lb (98 kg)  08/31/22 216 lb (98 kg)     Physical Exam Vitals and nursing note reviewed.  Constitutional:      General: He is not in acute distress.    Appearance: He is well-developed.  Cardiovascular:     Rate and Rhythm: Normal rate and regular rhythm.  Pulmonary:     Effort: Pulmonary effort is normal.     Breath sounds: Normal breath sounds.  Skin:    General: Skin is warm and dry.  Neurological:     Mental Status: He is alert and oriented to person, place, and time.      Lab Results:  CBC    Component Value Date/Time   WBC 9.7  09/10/2022 1820   RBC 5.06 09/10/2022 1820   HGB 13.5 09/10/2022 1820   HGB 12.6 (L) 06/28/2022 1449   HCT 42.6 09/10/2022 1820   HCT 38.0 06/28/2022 1449   PLT 248 09/10/2022 1820   PLT 228 06/28/2022 1449   MCV 84.2 09/10/2022 1820   MCV 80 06/28/2022 1449   MCH 26.7 09/10/2022 1820   MCHC 31.7 09/10/2022 1820   RDW 14.9 09/10/2022 1820   RDW 14.1 06/28/2022 1449   LYMPHSABS 2.2 09/10/2022 1820   LYMPHSABS 1.4 07/25/2017 1537   MONOABS 0.9 09/10/2022 1820   EOSABS 0.1 09/10/2022 1820   EOSABS 0.2 07/25/2017 1537   BASOSABS 0.0 09/10/2022 1820   BASOSABS 0.0 07/25/2017 1537    BMET    Component Value Date/Time   NA 131 (L) 09/10/2022 1820   NA 138 06/28/2022 1449  K 3.6 09/10/2022 1820   CL 95 (L) 09/10/2022 1820   CO2 20 (L) 09/10/2022 1820   GLUCOSE 182 (H) 09/10/2022 1820   BUN 15 09/10/2022 1820   BUN 11 06/28/2022 1449   CREATININE 0.77 09/10/2022 1820   CREATININE 0.86 01/15/2017 1501   CALCIUM 9.6 09/10/2022 1820   GFRNONAA >60 09/10/2022 1820   GFRNONAA >89 01/15/2017 1501   GFRAA 105 07/26/2020 1117   GFRAA >89 01/15/2017 1501     Assessment & Plan:   Colon cancer screening - Cologuard  2. Type 2 diabetes mellitus without complication, without long-term current use of insulin (HCC)  - TRULICITY 3 MG/0.5ML SOPN; Take 3 mg by mouth every Monday.  Dispense: 3 mL; Refill: 1 - CBC - Comprehensive metabolic panel - AMB Referral to Pharmacy Medication Management - POCT glycosylated hemoglobin (Hb A1C)  Follow up:  Follow up in 3 months     Ivonne Andrew, NP 01/12/2023

## 2023-01-15 ENCOUNTER — Ambulatory Visit: Payer: 59 | Admitting: Physician Assistant

## 2023-01-17 ENCOUNTER — Encounter: Payer: Self-pay | Admitting: Podiatry

## 2023-01-17 ENCOUNTER — Ambulatory Visit (INDEPENDENT_AMBULATORY_CARE_PROVIDER_SITE_OTHER): Payer: 59 | Admitting: Podiatry

## 2023-01-17 DIAGNOSIS — M722 Plantar fascial fibromatosis: Secondary | ICD-10-CM | POA: Diagnosis not present

## 2023-01-17 MED ORDER — TRIAMCINOLONE ACETONIDE 10 MG/ML IJ SUSP
10.0000 mg | Freq: Once | INTRAMUSCULAR | Status: AC
  Administered 2023-01-17: 10 mg via INTRA_ARTICULAR

## 2023-01-17 NOTE — Progress Notes (Signed)
Subjective:   Patient ID: Riley Clarke, male   DOB: 67 y.o.   MRN: [id number]   HPI Patient presents with recurrence of plantar fascial inflammation bilateral heels that he gets better for approximate 74-month period but then comes back intensely   ROS      Objective:  Physical Exam  Neuro vascular status unchanged with exquisite discomfort medial fascial band bilateral heel     Assessment:  Acute fasciitis bilateral     Plan:  Sterile prep injected the plantar fascia bilateral 3 mg Kenalog 5 mg Xylocaine reappoint to recheck

## 2023-01-18 ENCOUNTER — Telehealth: Payer: Self-pay

## 2023-01-18 NOTE — Progress Notes (Signed)
   Care Guide Note  01/18/2023 Name: Riley Clarke MRN: [id number] DOB: 1955-12-11  Referred by: Ivonne Andrew, NP Reason for referral : Care Management (Outreach to schedule referral with Pharm d )   Riley Clarke is a 67 y.o. year old male who is a primary care patient of Ivonne Andrew, NP. Riley Clarke was referred to the pharmacist for assistance related to DM.    Successful contact was made with the patient to discuss pharmacy services. Patient declines engagement at this time. Contact information was provided to the patient should they wish to reach out for assistance at a later time.  Penne Lash, RMA Care Guide [facility]  [place], [place] [address] Direct Dial: [phone] [email or web address]

## 2023-01-29 ENCOUNTER — Other Ambulatory Visit: Payer: Self-pay

## 2023-01-29 ENCOUNTER — Encounter: Payer: Self-pay | Admitting: Nurse Practitioner

## 2023-01-29 ENCOUNTER — Ambulatory Visit (INDEPENDENT_AMBULATORY_CARE_PROVIDER_SITE_OTHER): Payer: 59 | Admitting: Nurse Practitioner

## 2023-01-29 VITALS — BP 120/50 | HR 96 | Ht 72.0 in | Wt 224.1 lb

## 2023-01-29 DIAGNOSIS — K625 Hemorrhage of anus and rectum: Secondary | ICD-10-CM | POA: Diagnosis not present

## 2023-01-29 DIAGNOSIS — K5900 Constipation, unspecified: Secondary | ICD-10-CM

## 2023-01-29 DIAGNOSIS — R198 Other specified symptoms and signs involving the digestive system and abdomen: Secondary | ICD-10-CM | POA: Diagnosis not present

## 2023-01-29 MED ORDER — HYDROCORTISONE (PERIANAL) 2.5 % EX CREA: 1.0000 | TOPICAL_CREAM | Freq: Two times a day (BID) | CUTANEOUS | 0 refills | Status: DC

## 2023-01-29 MED ORDER — CYCLOBENZAPRINE HCL 10 MG PO TABS: 10.0000 mg | ORAL_TABLET | Freq: Three times a day (TID) | ORAL | 0 refills | Status: DC | PRN

## 2023-01-29 NOTE — Progress Notes (Signed)
Primary GI: Corliss Parish, MD   ASSESSMENT & PLAN   67 yo male with a history of diabetes, hypertension, hyperlipidemia, chronic back pain (spinal stenosis and cervical radiculitis and degenerative disc disease), diverticulosis, GERD    Rectal discomfort  and bleeding in setting of constipation. . Large internal hemorrhoids on anoscopy, likely source of bleeding and discomfort. The chronic, intermittent rectal bleeding has been present for years he says though not previously mentioned in notes --Treat constipation. Start daily fiber supplement, I recommended Metamucil.  --Increase water intake to 60 ounces daily  --In the meantime, if fiber not helping constipation after a couple of weeks , start Miralax 1 capful daily  --Anusol cream per rectum twice daily for 7 to 10 days --May be candidate for internal hemorrhoid banding. However, Dr. Meridee Score may prefer patient first undergo colonoscopy to rule out any other causes for rectal bleeding. He hasn't had a colonoscopy in greater than 10 years though Cologuard in April 2024 was negative.   --Hgb normal at 13 --Follow up with me on 11/10.   HPI   Brief GI history Patient was last seen in April 2022 by Alcide Evener, NP for bloating.   INTERVAL HISTORY    Chief complaint : feels like a tear in rectum. Sometimes has rectal bleeding with straining  Riley Clarke has irregular bowel habits. Gets urge to have a BM only a couple of times a week. But when he does he may have up to four BMs that day. BMs are sometimes loose, other times he has to strain. He  has had intermittent rectal bleeding associated with straining for years . He admits to a poor diet with junk food. Marland KitchenHe thinks he has a rectal tear because he can feel "a bump" in rectum.        Latest Ref Rng & Units 01/12/2023    3:10 PM 09/10/2022    6:20 PM 08/31/2022    1:30 PM  CBC  WBC 3.4 - 10.8 x10E3/uL 8.6  9.7  9.1   Hemoglobin 13.0 - 17.7 g/dL 78.2  95.6  21.3    Hematocrit 37.5 - 51.0 % 40.1  42.6  43.5   Platelets 150 - 450 x10E3/uL 232  248  232      Past Medical History:  Diagnosis Date   Arthritis    Chronic back pain    on chronic opioids   Diabetes mellitus without complication (HCC)    GERD (gastroesophageal reflux disease)    Headache(784.0)    HLD (hyperlipidemia)    Hypertension    no meds   Vitamin D deficiency     Past Surgical History:  Procedure Laterality Date   ANTERIOR CERVICAL DECOMP/DISCECTOMY FUSION N/A 09/05/2022   Procedure: Anterior Cervical Decompression Fusion  - Cervical three-Cervical four;  Surgeon: Julio Sicks, MD;  Location: [facility] OR;  Service: Neurosurgery;  Laterality: N/A;   CERVICAL DISC SURGERY  99   LUMBAR FUSION     LUMBAR LAMINECTOMY/DECOMPRESSION MICRODISCECTOMY Bilateral 03/09/2021   Procedure: Laminectomy and Foraminotomy - bilateral - Lumbar two-Lumbar three;  Surgeon: Julio Sicks, MD;  Location: [facility] OR;  Service: Neurosurgery;  Laterality: Bilateral;   ROTATOR CUFF REPAIR Bilateral    SHOULDER ARTHROSCOPY WITH ROTATOR CUFF REPAIR AND SUBACROMIAL DECOMPRESSION Right 12/30/2018   Procedure: SHOULDER ARTHROSCOPY WITH ROTATOR CUFF REPAIR AND SUBACROMIAL DECOMPRESSION, DISTAL CLAVICLE EXICISION,;  Surgeon: Bjorn Pippin, MD;  Location: [facility];  Service: Orthopedics;  Laterality: Right;   SHOULDER ARTHROSCOPY WITH SUBACROMIAL DECOMPRESSION,  ROTATOR CUFF REPAIR AND BICEP TENDON REPAIR Left 06/19/2019   Procedure: LEFT SHOULDER ARTHROSCOPY, EXTENTSIVE DEBRIDEMENT, DISTAL CLAVICULECTOMY, SUBACROMIAL DECOMPRESSION, PARTIAL ACROMIOPLASTY WITH CORACOACROMIAL RELEASE, ROTATOR CUFFF REPAIR AND BICEP TENODESIS;  Surgeon: Bjorn Pippin, MD;  Location: [facility];  Service: Orthopedics;  Laterality: Left;    Family History  Problem Relation Age of Onset   Heart disease Mother    Diabetes Mother    Amblyopia Neg Hx    Blindness Neg Hx    Cataracts Neg Hx    Hypertension Neg Hx     Macular degeneration Neg Hx    Retinal detachment Neg Hx    Strabismus Neg Hx    Esophageal cancer Neg Hx    Colon cancer Neg Hx    Pancreatic cancer Neg Hx    Ulcerative colitis Neg Hx    Inflammatory bowel disease Neg Hx    Liver disease Neg Hx    Rectal cancer Neg Hx    Stomach cancer Neg Hx     Current Medications, Allergies, Family History and Social History were reviewed in [employer] record.     Current Outpatient Medications  Medication Sig Dispense Refill   Continuous Blood Gluc Receiver (FREESTYLE LIBRE 2 READER) DEVI 1 each by Does not apply route in the morning, at noon, and at bedtime. 1 each 2   esomeprazole (NEXIUM) 40 MG capsule TAKE ONE (1) CAPSULE BY MOUTH EVERY MORNING BEFORE BREAKFAST 90 capsule 1   gabapentin (NEURONTIN) 300 MG capsule TAKE 1 CAPSULE(300 MG) BY MOUTH THREE TIMES DAILY 270 capsule 3   glucose blood (ACCU-CHEK AVIVA) test strip Use as instructed 100 each 12   JARDIANCE 25 MG TABS tablet TAKE 1 TABLET(25 MG) BY MOUTH DAILY 90 tablet 3   lisinopril-hydrochlorothiazide (ZESTORETIC) 20-25 MG tablet Take 1 tablet by mouth daily. 90 tablet 3   metFORMIN (GLUCOPHAGE) 1000 MG tablet TAKE 1 TABLET(1000 MG) BY MOUTH TWICE DAILY WITH A MEAL 180 tablet 1   oxyCODONE-acetaminophen (PERCOCET) 10-325 MG tablet Take 1 tablet by mouth 4 (four) times daily as needed for pain.     simvastatin (ZOCOR) 10 MG tablet TAKE 1 TABLET(10 MG) BY MOUTH DAILY 90 tablet 1   TRADJENTA 5 MG TABS tablet TAKE 1 TABLET(5 MG) BY MOUTH DAILY 90 tablet 3   TRULICITY 3 MG/0.5ML SOPN Take 3 mg by mouth every Monday. 3 mL 1   No current facility-administered medications for this visit.    Review of Systems: No chest pain. No shortness of breath. No urinary complaints.    Physical Exam  Wt Readings from Last 3 Encounters:  01/29/23 224 lb 2 oz (101.7 kg)  01/12/23 221 lb (100.2 kg)  10/30/22 216 lb 12.8 oz (98.3 kg)    Ht 6' (1.829 m)   Wt 224 lb 2 oz  (101.7 kg)   BMI 30.40 kg/m  Constitutional:  Pleasant, generally well appearing male in no acute distress. Psychiatric: Normal mood and affect. Behavior is normal. EENT: Pupils normal.  Conjunctivae are normal. No scleral icterus. Neck supple.  Cardiovascular: Normal rate, regular rhythm.  Pulmonary/chest: Effort normal and breath sounds normal. No wheezing, rales or rhonchi. Abdominal: Soft, nondistended, nontender. Bowel sounds active throughout. There are no masses palpable. No hepatomegaly. Rectal : Large swollen external hemorrhoid. On anoscopy there were very large swollen internal hemorrhoids.  Neurological: Alert and oriented to person place and time.    Willette Cluster, NP  01/29/2023, 11:14 AM

## 2023-01-29 NOTE — Patient Instructions (Addendum)
Constipation:  - Try to consume 60 oz water daily.  - Start daily fiber supplement. I recommend Metamucil. Follow directions on container.  - If constipation not improving after 2 weeks then add Miralax 1 capful in 8 ounces of water daily   We have sent the following medications to your pharmacy for you to pick up at your convenience:  START: Anusol Cream apply to rectum two times daily for 10 days ______________________________________________________  If your blood pressure at your visit was 140/90 or greater, please contact your primary care physician to follow up on this. _______________________________________________________  If you are age 20 or older, your body mass index should be between 23-30. Your Body mass index is 30.4 kg/m. If this is out of the aforementioned range listed, please consider follow up with your Primary Care Provider. ________________________________________________________  The [facility] providers would like to encourage you to use [facility] to communicate with providers for non-urgent requests or questions.  Due to long hold times on the telephone, sending your provider a message by [facility] may be a faster and more efficient way to get a response.  Please allow 48 business hours for a response.  Please remember that this is for non-urgent requests.  _______________________________________________________  Thank you for entrusting me with your care and choosing [facility].  Willette Cluster, NP

## 2023-01-30 NOTE — Progress Notes (Signed)
Attending Physician's Attestation   I have reviewed the chart.   I agree with the Advanced Practitioner's note, impression, and recommendations with any updates as below. With the persisting issues in regards to rectal bleeding, I would recommend colonoscopy versus at least a flexible sigmoidoscopy since he already had a Cologuard done.  But I think some sort of endoscopic evaluation of the left colon to be pursued.  If nothing else is found, then 1 my partners can certainly offer patient hemorrhoidal banding to try to help with his bleeding.  Alternating Calmol-4 suppositories could be helpful as well.  Next available procedure slot with me or may do a 730AM start at [facility] if needed.   Corliss Parish, MD [facility]oscopy Office # [phone]

## 2023-02-02 ENCOUNTER — Telehealth: Payer: Self-pay | Admitting: *Deleted

## 2023-02-02 ENCOUNTER — Other Ambulatory Visit: Payer: Self-pay | Admitting: *Deleted

## 2023-02-02 DIAGNOSIS — K625 Hemorrhage of anus and rectum: Secondary | ICD-10-CM

## 2023-02-02 MED ORDER — NA SULFATE-K SULFATE-MG SULF 17.5-3.13-1.6 GM/177ML PO SOLN: 1.0000 | Freq: Once | ORAL | 0 refills | Status: AC

## 2023-02-02 NOTE — Telephone Encounter (Signed)
-----   Message from Willette Cluster sent at 02/01/2023  3:22 PM EDT ----- Alona Bene, would you please call this patient and let him know that I did discuss his case with his GI doc who agrees that a lower endoscopy is warranted to evaluate the rectal bleeding.  I know he had a Cologuard in April and I realized that the hemorrhoids are probably causing the bleeding but colonoscopy is still recommended.  If colonoscopy is negative then we can arrange for hemorrhoid banding at some point.  Thanks

## 2023-02-02 NOTE — Telephone Encounter (Signed)
Called patient to inform that his case was discussed between Ms. Guenther. PA and another provider and they are in agreement with colonoscopy recommendations. Ms. Wilmon Pali, [place] states if colonoscopy is negative we can arrange for hemorrhoid banding. Patient understood and is in agreement. Scheduled colonoscopy procedure and pre-visit in October.

## 2023-02-16 ENCOUNTER — Other Ambulatory Visit: Payer: Self-pay | Admitting: Orthopaedic Surgery

## 2023-02-16 DIAGNOSIS — Z01818 Encounter for other preprocedural examination: Secondary | ICD-10-CM

## 2023-02-26 ENCOUNTER — Ambulatory Visit
Admission: RE | Admit: 2023-02-26 | Discharge: 2023-02-26 | Disposition: A | Discharge status: Home / Self Care | Payer: 59 | Source: Ambulatory Visit | Attending: Orthopaedic Surgery

## 2023-02-26 DIAGNOSIS — Z01818 Encounter for other preprocedural examination: Secondary | ICD-10-CM

## 2023-02-27 ENCOUNTER — Telehealth: Payer: Self-pay | Admitting: Nurse Practitioner

## 2023-02-27 ENCOUNTER — Other Ambulatory Visit: Payer: Self-pay

## 2023-02-27 DIAGNOSIS — E119 Type 2 diabetes mellitus without complications: Secondary | ICD-10-CM

## 2023-02-27 MED ORDER — LANCETS MISC. MISC: 1.0000 | Freq: Two times a day (BID) | 4 refills | Status: AC

## 2023-02-27 MED ORDER — LANCET DEVICE MISC: 1.0000 | Freq: Two times a day (BID) | 0 refills | Status: AC

## 2023-02-27 MED ORDER — BLOOD GLUCOSE MONITORING SUPPL DEVI: 1.0000 | Freq: Two times a day (BID) | 0 refills | Status: DC

## 2023-02-27 MED ORDER — BLOOD GLUCOSE TEST VI STRP: 1.0000 | ORAL_STRIP | Freq: Two times a day (BID) | 4 refills | Status: DC

## 2023-02-27 NOTE — Telephone Encounter (Signed)
Done Brayton Caves

## 2023-02-27 NOTE — Telephone Encounter (Signed)
Caller & Relationship to patient:  MRN #  [id number]   Call Back Number:   Date of Last Office Visit: 01/12/2023     Date of Next Office Visit: 04/16/2023    Medication(s) to be Refilled: diabetic test strips  Preferred Pharmacy: [facility] on [address]   ** Please notify patient to allow 48-72 hours to process** **Let patient know to contact pharmacy at the end of the day to make sure medication is ready. ** **If patient has not been seen in a year or longer, book an appointment **Advise to use MyChart for refill requests OR to contact their pharmacy

## 2023-02-28 ENCOUNTER — Other Ambulatory Visit: Payer: Self-pay | Admitting: Nurse Practitioner

## 2023-02-28 DIAGNOSIS — E119 Type 2 diabetes mellitus without complications: Secondary | ICD-10-CM

## 2023-02-28 MED ORDER — BLOOD GLUCOSE TEST VI STRP: 1.0000 | ORAL_STRIP | Freq: Two times a day (BID) | 4 refills | Status: AC

## 2023-03-13 ENCOUNTER — Ambulatory Visit (AMBULATORY_SURGERY_CENTER): Payer: 59

## 2023-03-13 ENCOUNTER — Other Ambulatory Visit: Payer: Self-pay

## 2023-03-13 VITALS — Ht 72.0 in | Wt 226.0 lb

## 2023-03-13 DIAGNOSIS — K625 Hemorrhage of anus and rectum: Secondary | ICD-10-CM

## 2023-03-13 NOTE — Progress Notes (Signed)

## 2023-03-13 NOTE — Telephone Encounter (Signed)
Please advise .  Last written for almost a year ago. Kh

## 2023-03-14 MED ORDER — EMPAGLIFLOZIN 25 MG PO TABS: 25.0000 mg | ORAL_TABLET | Freq: Every day | ORAL | 3 refills | Status: DC

## 2023-03-15 ENCOUNTER — Encounter: Payer: Self-pay | Admitting: Nurse Practitioner

## 2023-03-15 ENCOUNTER — Ambulatory Visit (INDEPENDENT_AMBULATORY_CARE_PROVIDER_SITE_OTHER): Payer: 59 | Admitting: Nurse Practitioner

## 2023-03-15 VITALS — BP 114/57 | HR 78 | Temp 97.5°F | Wt 226.0 lb

## 2023-03-15 DIAGNOSIS — E119 Type 2 diabetes mellitus without complications: Secondary | ICD-10-CM

## 2023-03-15 NOTE — Progress Notes (Signed)
Subjective   Patient ID: Riley Clarke, male    DOB: Oct 25, 1955, 67 y.o.   MRN: [id number]  Chief Complaint  Patient presents with   Pre-op Exam    Referring provider: Ivonne Andrew, NP  Lyman Speller Castrejon is a 67 y.o. male with Past Medical History: No date: Arthritis No date: Chronic back pain     Comment:  on chronic opioids No date: Diabetes mellitus without complication (HCC) No date: GERD (gastroesophageal reflux disease) No date: Headache(784.0) No date: HLD (hyperlipidemia) No date: Hypertension     Comment:  no meds No date: Vitamin D deficiency  HPI  Patient presents today for surgical clearance upcoming orthopedic surgery.  Patient will be having reverse total shoulder arthroplasty surgical clearance form requires hemoglobin A1c and states that an A1c of less than 7 point he is required for joint surgeries.  Patient's last A1c 2 months ago was over 10.  We will check fructosamine level today to be converted to A1c. Denies f/c/s, n/v/d, hemoptysis, PND, leg swelling Denies chest pain or edema       Allergies  Allergen Reactions   Cymbalta [Duloxetine Hcl] Nausea And Vomiting and Other (See Comments)    Dizziness/headaches   Shrimp [Shellfish Allergy]     intolerant   Valium [Diazepam] Itching    Immunization History  Administered Date(s) Administered   Influenza,inj,Quad PF,6+ Mos 04/01/2015, 02/04/2016, 02/19/2017, 04/02/2018, 02/02/2021   Moderna SARS-COV2 Booster Vaccination 05/04/2020   Moderna Sars-Covid-2 Vaccination 08/27/2019, 09/24/2019   Pneumococcal Conjugate-13 04/01/2015, 10/11/2022   Pneumococcal Polysaccharide-23 05/08/2016   Tdap 09/06/2014, 04/01/2015    Tobacco History: Social History   Tobacco Use  Smoking Status Former   Current packs/day: 0.00   Average packs/day: 0.3 packs/day for 25.0 years (6.3 ttl pk-yrs)   Types: Cigarettes   Start date: 04/27/1995   Quit date: 04/26/2020   Years since quitting: 2.8  Smokeless  Tobacco Never   Counseling given: Not Answered   Outpatient Encounter Medications as of 03/15/2023  Medication Sig   Blood Glucose Monitoring Suppl DEVI 1 each by Does not apply route in the morning and at bedtime. May substitute to any manufacturer covered by patient's insurance.   cyclobenzaprine (FLEXERIL) 10 MG tablet Take 1 tablet (10 mg total) by mouth 3 (three) times daily as needed for muscle spasms.   empagliflozin (JARDIANCE) 25 MG TABS tablet Take 1 tablet (25 mg total) by mouth daily.   esomeprazole (NEXIUM) 40 MG capsule TAKE ONE (1) CAPSULE BY MOUTH EVERY MORNING BEFORE BREAKFAST   gabapentin (NEURONTIN) 300 MG capsule TAKE 1 CAPSULE(300 MG) BY MOUTH THREE TIMES DAILY   Glucose Blood (BLOOD GLUCOSE TEST STRIPS) STRP 1 each by In Vitro route in the morning and at bedtime. May substitute to any manufacturer covered by patient's insurance.   hydrocortisone (ANUSOL-HC) 2.5 % rectal cream Place 1 Application rectally 2 (two) times daily. Use for 10 days   Lancet Device MISC 1 each by Does not apply route in the morning and at bedtime. May substitute to any manufacturer covered by patient's insurance.   Lancets Misc. MISC 1 each by Does not apply route in the morning and at bedtime. May substitute to any manufacturer covered by patient's insurance.   lisinopril-hydrochlorothiazide (ZESTORETIC) 20-25 MG tablet Take 1 tablet by mouth daily.   metFORMIN (GLUCOPHAGE) 1000 MG tablet TAKE 1 TABLET(1000 MG) BY MOUTH TWICE DAILY WITH A MEAL   oxyCODONE-acetaminophen (PERCOCET) 10-325 MG tablet Take 1 tablet by mouth 4 (four)  times daily as needed for pain.   simvastatin (ZOCOR) 10 MG tablet TAKE 1 TABLET(10 MG) BY MOUTH DAILY   TRADJENTA 5 MG TABS tablet TAKE 1 TABLET(5 MG) BY MOUTH DAILY   TRULICITY 3 MG/0.5ML SOPN Take 3 mg by mouth every Monday.   No facility-administered encounter medications on file as of 03/15/2023.    Review of Systems  Review of Systems  Constitutional: Negative.    HENT: Negative.    Cardiovascular: Negative.   Gastrointestinal: Negative.   Allergic/Immunologic: Negative.   Neurological: Negative.   Psychiatric/Behavioral: Negative.       Objective:   BP (!) 114/57   Pulse 78   Temp (!) 97.5 F (36.4 C)   Wt 226 lb (102.5 kg)   SpO2 100%   BMI 30.65 kg/m   Wt Readings from Last 5 Encounters:  03/15/23 226 lb (102.5 kg)  03/13/23 226 lb (102.5 kg)  01/29/23 224 lb 2 oz (101.7 kg)  01/12/23 221 lb (100.2 kg)  10/30/22 216 lb 12.8 oz (98.3 kg)     Physical Exam Vitals and nursing note reviewed.  Constitutional:      General: He is not in acute distress.    Appearance: He is well-developed.  Cardiovascular:     Rate and Rhythm: Normal rate and regular rhythm.  Pulmonary:     Effort: Pulmonary effort is normal.     Breath sounds: Normal breath sounds.  Skin:    General: Skin is warm and dry.  Neurological:     Mental Status: He is alert and oriented to person, place, and time.       Assessment & Plan:   Type 2 diabetes mellitus without complication, without long-term current use of insulin (HCC) -     Fructosamine     Return if symptoms worsen or fail to improve.   Ivonne Andrew, NP 03/15/2023

## 2023-03-15 NOTE — Patient Instructions (Signed)
1. Type 2 diabetes mellitus without complication, without long-term current use of insulin (HCC)  - Fructosamine   Follow up:  Follow up as scheduled

## 2023-03-16 ENCOUNTER — Encounter: Payer: Self-pay | Admitting: Gastroenterology

## 2023-03-16 LAB — FRUCTOSAMINE: Fructosamine: 263 umol/L (ref 0–285)

## 2023-03-21 ENCOUNTER — Encounter: Payer: Self-pay | Admitting: Nurse Practitioner

## 2023-03-21 ENCOUNTER — Ambulatory Visit (INDEPENDENT_AMBULATORY_CARE_PROVIDER_SITE_OTHER): Payer: 59 | Admitting: Nurse Practitioner

## 2023-03-21 VITALS — BP 134/64 | HR 74 | Ht 72.0 in | Wt 224.0 lb

## 2023-03-21 DIAGNOSIS — L0201 Cutaneous abscess of face: Secondary | ICD-10-CM

## 2023-03-21 DIAGNOSIS — R9431 Abnormal electrocardiogram [ECG] [EKG]: Secondary | ICD-10-CM | POA: Diagnosis not present

## 2023-03-21 DIAGNOSIS — I498 Other specified cardiac arrhythmias: Secondary | ICD-10-CM | POA: Diagnosis not present

## 2023-03-21 DIAGNOSIS — I499 Cardiac arrhythmia, unspecified: Secondary | ICD-10-CM | POA: Diagnosis not present

## 2023-03-21 MED ORDER — CEPHALEXIN 500 MG PO CAPS: 500.0000 mg | ORAL_CAPSULE | Freq: Two times a day (BID) | ORAL | 0 refills | Status: AC

## 2023-03-21 NOTE — Patient Instructions (Addendum)
1. Irregular heart rhythm  - EKG 12-Lead - Ambulatory referral to Cardiology  2. Cutaneous abscess of face  - cephALEXin (KEFLEX) 500 MG capsule; Take 1 capsule (500 mg total) by mouth 2 (two) times daily for 7 days.  Dispense: 14 capsule; Refill: 0  3. Bigeminy  - Ambulatory referral to Cardiology  4. Abnormal EKG  - Ambulatory referral to Cardiology  Follow up:  Follow up if symptoms worsen

## 2023-03-21 NOTE — Progress Notes (Signed)
Subjective   Patient ID: Riley Clarke, male    DOB: [birth year], 67 y.o.   MRN: [id number]  Chief Complaint  Patient presents with   Mass    Under chin, started yesterday, hurts to touch    Referring provider: Ivonne Andrew, NP  Riley Clarke is a 67 y.o. male with Past Medical History: No date: Arthritis No date: Chronic back pain     Comment:  on chronic opioids No date: Diabetes mellitus without complication (HCC) No date: GERD (gastroesophageal reflux disease) No date: Headache(784.0) No date: HLD (hyperlipidemia) No date: Hypertension     Comment:  no meds No date: Vitamin D deficiency  HPI  Patient presents today for an acute visit.  He states that since yesterday he has had a large bump under his chin.  He states that the area is tender to touch.  Also upon arrival to the office while checking patient's vital signs it was noted that his heart rate was irregular.  EKG revealed a pattern of bigeminy.  Patient will be referred to cardiology. Denies f/c/s, n/v/d, hemoptysis, PND, leg swelling Denies chest pain or edema    Allergies  Allergen Reactions   Cymbalta [Duloxetine Hcl] Nausea And Vomiting and Other (See Comments)    Dizziness/headaches   Shrimp [Shellfish Allergy]     intolerant   Valium [Diazepam] Itching    Immunization History  Administered Date(s) Administered   Influenza,inj,Quad PF,6+ Mos 04/01/2015, 02/04/2016, 02/19/2017, 04/02/2018, 02/02/2021   Moderna SARS-COV2 Booster Vaccination 05/04/2020   Moderna Sars-Covid-2 Vaccination 08/27/2019, 09/24/2019   Pneumococcal Conjugate-13 04/01/2015, 10/11/2022   Pneumococcal Polysaccharide-23 05/08/2016   Tdap 09/06/2014, 04/01/2015    Tobacco History: Social History   Tobacco Use  Smoking Status Former   Current packs/day: 0.00   Average packs/day: 0.3 packs/day for 25.0 years (6.3 ttl pk-yrs)   Types: Cigarettes   Start date: 04/27/1995   Quit date: 04/26/2020   Years since  quitting: 2.9  Smokeless Tobacco Never   Counseling given: Not Answered   Outpatient Encounter Medications as of 03/21/2023  Medication Sig   Blood Glucose Monitoring Suppl DEVI 1 each by Does not apply route in the morning and at bedtime. May substitute to any manufacturer covered by patient's insurance.   cephALEXin (KEFLEX) 500 MG capsule Take 1 capsule (500 mg total) by mouth 2 (two) times daily for 7 days.   cyclobenzaprine (FLEXERIL) 10 MG tablet Take 1 tablet (10 mg total) by mouth 3 (three) times daily as needed for muscle spasms.   empagliflozin (JARDIANCE) 25 MG TABS tablet Take 1 tablet (25 mg total) by mouth daily.   esomeprazole (NEXIUM) 40 MG capsule TAKE ONE (1) CAPSULE BY MOUTH EVERY MORNING BEFORE BREAKFAST   gabapentin (NEURONTIN) 300 MG capsule TAKE 1 CAPSULE(300 MG) BY MOUTH THREE TIMES DAILY   Glucose Blood (BLOOD GLUCOSE TEST STRIPS) STRP 1 each by In Vitro route in the morning and at bedtime. May substitute to any manufacturer covered by patient's insurance.   hydrocortisone (ANUSOL-HC) 2.5 % rectal cream Place 1 Application rectally 2 (two) times daily. Use for 10 days   Lancet Device MISC 1 each by Does not apply route in the morning and at bedtime. May substitute to any manufacturer covered by patient's insurance.   Lancets Misc. MISC 1 each by Does not apply route in the morning and at bedtime. May substitute to any manufacturer covered by patient's insurance.   lisinopril-hydrochlorothiazide (ZESTORETIC) 20-25 MG tablet Take 1 tablet by mouth  daily.   metFORMIN (GLUCOPHAGE) 1000 MG tablet TAKE 1 TABLET(1000 MG) BY MOUTH TWICE DAILY WITH A MEAL   oxyCODONE-acetaminophen (PERCOCET) 10-325 MG tablet Take 1 tablet by mouth 4 (four) times daily as needed for pain.   simvastatin (ZOCOR) 10 MG tablet TAKE 1 TABLET(10 MG) BY MOUTH DAILY   TRADJENTA 5 MG TABS tablet TAKE 1 TABLET(5 MG) BY MOUTH DAILY   TRULICITY 3 MG/0.5ML SOPN Take 3 mg by mouth every Monday.   No  facility-administered encounter medications on file as of 03/21/2023.    Review of Systems  Review of Systems  Constitutional: Negative.   HENT: Negative.    Cardiovascular: Negative.   Gastrointestinal: Negative.   Allergic/Immunologic: Negative.   Neurological: Negative.   Psychiatric/Behavioral: Negative.       Objective:   BP 134/64   Pulse 74   Ht 6' (1.829 m)   Wt 224 lb (101.6 kg)   SpO2 100%   BMI 30.38 kg/m   Wt Readings from Last 5 Encounters:  03/21/23 224 lb (101.6 kg)  03/15/23 226 lb (102.5 kg)  03/13/23 226 lb (102.5 kg)  01/29/23 224 lb 2 oz (101.7 kg)  01/12/23 221 lb (100.2 kg)     Physical Exam Vitals and nursing note reviewed.  Constitutional:      General: He is not in acute distress.    Appearance: He is well-developed.  Neck:      Comments: Raised area that is tender to palpation noted Cardiovascular:     Rate and Rhythm: Normal rate and regular rhythm.  Pulmonary:     Effort: Pulmonary effort is normal.     Breath sounds: Normal breath sounds.  Skin:    General: Skin is warm and dry.  Neurological:     Mental Status: He is alert and oriented to person, place, and time.       Assessment & Plan:   Irregular heart rhythm -     EKG 12-Lead -     Ambulatory referral to Cardiology  Cutaneous abscess of face -     Cephalexin; Take 1 capsule (500 mg total) by mouth 2 (two) times daily for 7 days.  Dispense: 14 capsule; Refill: 0  Bigeminy -     Ambulatory referral to Cardiology  Abnormal EKG -     Ambulatory referral to Cardiology     Return if symptoms worsen or fail to improve.   Ivonne Andrew, NP 03/21/2023

## 2023-03-27 NOTE — Telephone Encounter (Signed)
Pt said that he throat is still bothering him   He said [place] asked him to call back about this if it continues

## 2023-03-28 ENCOUNTER — Ambulatory Visit: Payer: 59 | Admitting: Gastroenterology

## 2023-03-28 ENCOUNTER — Encounter: Payer: Self-pay | Admitting: Gastroenterology

## 2023-03-28 VITALS — BP 128/71 | HR 88 | Temp 97.8°F | Resp 16 | Ht 72.0 in | Wt 226.0 lb

## 2023-03-28 DIAGNOSIS — Z1211 Encounter for screening for malignant neoplasm of colon: Secondary | ICD-10-CM | POA: Diagnosis not present

## 2023-03-28 DIAGNOSIS — D123 Benign neoplasm of transverse colon: Secondary | ICD-10-CM | POA: Diagnosis not present

## 2023-03-28 DIAGNOSIS — K625 Hemorrhage of anus and rectum: Secondary | ICD-10-CM

## 2023-03-28 DIAGNOSIS — D127 Benign neoplasm of rectosigmoid junction: Secondary | ICD-10-CM

## 2023-03-28 DIAGNOSIS — K51411 Inflammatory polyps of colon with rectal bleeding: Secondary | ICD-10-CM | POA: Diagnosis not present

## 2023-03-28 MED ORDER — SODIUM CHLORIDE 0.9 % IV SOLN: 500.0000 mL | Freq: Once | INTRAVENOUS | Status: DC

## 2023-03-28 NOTE — Progress Notes (Signed)
Pt's states no medical or surgical changes since previsit or office visit. VS assessed by this Nurse.

## 2023-03-28 NOTE — Op Note (Signed)
[facility] Patient Name: Riley Clarke Procedure Date: 03/28/2023 9:32 AM MRN: [id number] Endoscopist: Corliss Parish , MD, [id number] Age: 67 Referring MD:  Date of Birth: 1956/01/14 Gender: Male Account #: [id number] Procedure:                Colonoscopy Indications:              Screening for colorectal malignant neoplasm,                            Incidental - Hematochezia Medicines:                Monitored Anesthesia Care Procedure:                Pre-Anesthesia Assessment:                           - Prior to the procedure, a History and Physical                            was performed, and patient medications and                            allergies were reviewed. The patient's tolerance of                            previous anesthesia was also reviewed. The risks                            and benefits of the procedure and the sedation                            options and risks were discussed with the patient.                            All questions were answered, and informed consent                            was obtained. Prior Anticoagulants: The patient has                            taken no anticoagulant or antiplatelet agents. ASA                            Grade Assessment: II - A patient with mild systemic                            disease. After reviewing the risks and benefits,                            the patient was deemed in satisfactory condition to                            undergo the procedure.  After obtaining informed consent, the colonoscope                            was passed under direct vision. Throughout the                            procedure, the patient's blood pressure, pulse, and                            oxygen saturations were monitored continuously. The                            Olympus Scope SN: [id number] was introduced through                            the anus and advanced to the the  cecum, identified                            by appendiceal orifice and ileocecal valve. The                            colonoscopy was performed without difficulty. The                            patient tolerated the procedure. The quality of the                            bowel preparation was good. The ileocecal valve,                            appendiceal orifice, and rectum were photographed. Scope In: 9:41:59 AM Scope Out: 9:54:16 AM Scope Withdrawal Time: 0 hours 9 minutes 18 seconds  Total Procedure Duration: 0 hours 12 minutes 17 seconds  Findings:                 The digital rectal exam findings include                            hemorrhoids. Pertinent negatives include no                            palpable rectal lesions.                           Two sessile polyps were found in the recto-sigmoid                            colon and transverse colon. The polyps were 3 to 7                            mm in size. These polyps were removed with a cold                            snare. Resection and retrieval were  complete.                           Multiple small-mouthed diverticula were found in                            the entire colon.                           Normal mucosa was found in the entire colon                            otherwise.                           Non-bleeding non-thrombosed internal hemorrhoids                            were found during retroflexion, during perianal                            exam and during digital exam. The hemorrhoids were                            Grade II (internal hemorrhoids that prolapse but                            reduce spontaneously). Complications:            No immediate complications. Estimated Blood Loss:     Estimated blood loss was minimal. Impression:               - Hemorrhoids found on digital rectal exam.                           - Two 3 to 7 mm polyps at the recto-sigmoid colon                             and in the transverse colon, removed with a cold                            snare. Resected and retrieved.                           - Diverticulosis in the entire examined colon.                           - Normal mucosa in the entire examined colon                            otherwise.                           - Non-bleeding non-thrombosed internal hemorrhoids. Recommendation:           - The patient will be observed post-procedure,  until all discharge criteria are met.                           - Discharge patient to home.                           - Patient has a contact number available for                            emergencies. The signs and symptoms of potential                            delayed complications were discussed with the                            patient. Return to normal activities tomorrow.                            Written discharge instructions were provided to the                            patient.                           - High fiber diet.                           - Use FiberCon 1-2 tablets PO daily.                           - Continue present medications.                           - Await pathology results.                           - Repeat colonoscopy in 5/7 years for surveillance                            based on pathology results.                           - If patient's episodes of intermittent rectal                            bleeding continue I think he is a reasonable                            candidate for internal hemorrhoidal banding and I                            can refer to one of my partners if he wants further                            information or to move forward with procedures. He  should initiate the fiber supplementation and                            continue the Anusol suppositories as needed.                           - The findings and recommendations were discussed                             with the patient.                           - The findings and recommendations were discussed                            with the patient's family. Corliss Parish, MD 03/28/2023 10:00:09 AM

## 2023-03-28 NOTE — Patient Instructions (Addendum)
-  Handout on polyps, diverticulosis and hemorrhoids, high fiber diet provided -await pathology results -repeat colonoscopy for surveillance recommended. Date to be determined when pathology result become available   -Continue present medications  Recommend a high-fiber diet (see handout). Use FiberCon 1-2 tablets by mouth daily.    YOU HAD AN ENDOSCOPIC PROCEDURE TODAY AT THE [facility]:   Refer to the procedure report that was given to you for any specific questions about what was found during the examination.  If the procedure report does not answer your questions, please call your gastroenterologist to clarify.  If you requested that your care partner not be given the details of your procedure findings, then the procedure report has been included in a sealed envelope for you to review at your convenience later.  YOU SHOULD EXPECT: Some feelings of bloating in the abdomen. Passage of more gas than usual.  Walking can help get rid of the air that was put into your GI tract during the procedure and reduce the bloating. If you had a lower endoscopy (such as a colonoscopy or flexible sigmoidoscopy) you may notice spotting of blood in your stool or on the toilet paper. If you underwent a bowel prep for your procedure, you may not have a normal bowel movement for a few days.  Please Note:  You might notice some irritation and congestion in your nose or some drainage.  This is from the oxygen used during your procedure.  There is no need for concern and it should clear up in a day or so.  SYMPTOMS TO REPORT IMMEDIATELY:  Following lower endoscopy (colonoscopy or flexible sigmoidoscopy):  Excessive amounts of blood in the stool  Significant tenderness or worsening of abdominal pains  Swelling of the abdomen that is new, acute  Fever of 100F or higher   For urgent or emergent issues, a gastroenterologist can be reached at any hour by calling [phone]. Do not use MyChart messaging  for urgent concerns.    DIET:  We do recommend a small meal at first, but then you may proceed to your regular diet.  Drink plenty of fluids but you should avoid alcoholic beverages for 24 hours.  ACTIVITY:  You should plan to take it easy for the rest of today and you should NOT DRIVE or use heavy machinery until tomorrow (because of the sedation medicines used during the test).    FOLLOW UP: Our staff will call the number listed on your records the next business day following your procedure.  We will call around 7:15- 8:00 am to check on you and address any questions or concerns that you may have regarding the information given to you following your procedure. If we do not reach you, we will leave a message.     If any biopsies were taken you will be contacted by phone or by letter within the next 1-3 weeks.  Please call us at [phone] if you have not heard about the biopsies in 3 weeks.    SIGNATURES/CONFIDENTIALITY: You and/or your care partner have signed paperwork which will be entered into your electronic medical record.  These signatures attest to the fact that that the information above on your After Visit Summary has been reviewed and is understood.  Full responsibility of the confidentiality of this discharge information lies with you and/or your care-partner.

## 2023-03-28 NOTE — Progress Notes (Signed)
Report to PACU, RN, vss, BBS= Clear.  

## 2023-03-28 NOTE — Progress Notes (Signed)
Called to room to assist during endoscopic procedure.  Patient ID and intended procedure confirmed with present staff. Received instructions for my participation in the procedure from the performing physician.  

## 2023-03-28 NOTE — Progress Notes (Signed)
GASTROENTEROLOGY PROCEDURE H&P NOTE   Primary Care Physician: Ivonne Andrew, NP  HPI: Riley Clarke is a 67 y.o. male who presents for Colonoscopy for screening/rectal bleeding evaluation.  Past Medical History:  Diagnosis Date   Arthritis    Chronic back pain    on chronic opioids   Diabetes mellitus without complication (HCC)    GERD (gastroesophageal reflux disease)    Headache(784.0)    HLD (hyperlipidemia)    Hypertension    no meds   Vitamin D deficiency    Past Surgical History:  Procedure Laterality Date   ANTERIOR CERVICAL DECOMP/DISCECTOMY FUSION N/A 09/05/2022   Procedure: Anterior Cervical Decompression Fusion  - Cervical three-Cervical four;  Surgeon: Julio Sicks, MD;  Location: [facility] OR;  Service: Neurosurgery;  Laterality: N/A;   CERVICAL DISC SURGERY  99   LUMBAR FUSION     LUMBAR LAMINECTOMY/DECOMPRESSION MICRODISCECTOMY Bilateral 03/09/2021   Procedure: Laminectomy and Foraminotomy - bilateral - Lumbar two-Lumbar three;  Surgeon: Julio Sicks, MD;  Location: [facility] OR;  Service: Neurosurgery;  Laterality: Bilateral;   ROTATOR CUFF REPAIR Bilateral    SHOULDER ARTHROSCOPY WITH ROTATOR CUFF REPAIR AND SUBACROMIAL DECOMPRESSION Right 12/30/2018   Procedure: SHOULDER ARTHROSCOPY WITH ROTATOR CUFF REPAIR AND SUBACROMIAL DECOMPRESSION, DISTAL CLAVICLE EXICISION,;  Surgeon: Bjorn Pippin, MD;  Location: [facility];  Service: Orthopedics;  Laterality: Right;   SHOULDER ARTHROSCOPY WITH SUBACROMIAL DECOMPRESSION, ROTATOR CUFF REPAIR AND BICEP TENDON REPAIR Left 06/19/2019   Procedure: LEFT SHOULDER ARTHROSCOPY, EXTENTSIVE DEBRIDEMENT, DISTAL CLAVICULECTOMY, SUBACROMIAL DECOMPRESSION, PARTIAL ACROMIOPLASTY WITH CORACOACROMIAL RELEASE, ROTATOR CUFFF REPAIR AND BICEP TENODESIS;  Surgeon: Bjorn Pippin, MD;  Location:  SURGERY CENTER;  Service: Orthopedics;  Laterality: Left;   Current Outpatient Medications  Medication Sig Dispense Refill   Blood  Glucose Monitoring Suppl DEVI 1 each by Does not apply route in the morning and at bedtime. May substitute to any manufacturer covered by patient's insurance. 1 each 0   cephALEXin (KEFLEX) 500 MG capsule Take 1 capsule (500 mg total) by mouth 2 (two) times daily for 7 days. 14 capsule 0   cyclobenzaprine (FLEXERIL) 10 MG tablet Take 1 tablet (10 mg total) by mouth 3 (three) times daily as needed for muscle spasms. 30 tablet 0   empagliflozin (JARDIANCE) 25 MG TABS tablet Take 1 tablet (25 mg total) by mouth daily. 90 tablet 3   esomeprazole (NEXIUM) 40 MG capsule TAKE ONE (1) CAPSULE BY MOUTH EVERY MORNING BEFORE BREAKFAST 90 capsule 1   gabapentin (NEURONTIN) 300 MG capsule TAKE 1 CAPSULE(300 MG) BY MOUTH THREE TIMES DAILY 270 capsule 3   Glucose Blood (BLOOD GLUCOSE TEST STRIPS) STRP 1 each by In Vitro route in the morning and at bedtime. May substitute to any manufacturer covered by patient's insurance. 200 strip 4   hydrocortisone (ANUSOL-HC) 2.5 % rectal cream Place 1 Application rectally 2 (two) times daily. Use for 10 days 30 g 0   Lancet Device MISC 1 each by Does not apply route in the morning and at bedtime. May substitute to any manufacturer covered by patient's insurance. 1 each 0   Lancets Misc. MISC 1 each by Does not apply route in the morning and at bedtime. May substitute to any manufacturer covered by patient's insurance. 200 each 4   lisinopril-hydrochlorothiazide (ZESTORETIC) 20-25 MG tablet Take 1 tablet by mouth daily. 90 tablet 3   metFORMIN (GLUCOPHAGE) 1000 MG tablet TAKE 1 TABLET(1000 MG) BY MOUTH TWICE DAILY WITH A MEAL 180 tablet 1  oxyCODONE-acetaminophen (PERCOCET) 10-325 MG tablet Take 1 tablet by mouth 4 (four) times daily as needed for pain.     simvastatin (ZOCOR) 10 MG tablet TAKE 1 TABLET(10 MG) BY MOUTH DAILY 90 tablet 1   TRADJENTA 5 MG TABS tablet TAKE 1 TABLET(5 MG) BY MOUTH DAILY 90 tablet 3   TRULICITY 3 MG/0.5ML SOPN Take 3 mg by mouth every Monday. 3 mL 1    No current facility-administered medications for this visit.    Current Outpatient Medications:    Blood Glucose Monitoring Suppl DEVI, 1 each by Does not apply route in the morning and at bedtime. May substitute to any manufacturer covered by patient's insurance., Disp: 1 each, Rfl: 0   cephALEXin (KEFLEX) 500 MG capsule, Take 1 capsule (500 mg total) by mouth 2 (two) times daily for 7 days., Disp: 14 capsule, Rfl: 0   cyclobenzaprine (FLEXERIL) 10 MG tablet, Take 1 tablet (10 mg total) by mouth 3 (three) times daily as needed for muscle spasms., Disp: 30 tablet, Rfl: 0   empagliflozin (JARDIANCE) 25 MG TABS tablet, Take 1 tablet (25 mg total) by mouth daily., Disp: 90 tablet, Rfl: 3   esomeprazole (NEXIUM) 40 MG capsule, TAKE ONE (1) CAPSULE BY MOUTH EVERY MORNING BEFORE BREAKFAST, Disp: 90 capsule, Rfl: 1   gabapentin (NEURONTIN) 300 MG capsule, TAKE 1 CAPSULE(300 MG) BY MOUTH THREE TIMES DAILY, Disp: 270 capsule, Rfl: 3   Glucose Blood (BLOOD GLUCOSE TEST STRIPS) STRP, 1 each by In Vitro route in the morning and at bedtime. May substitute to any manufacturer covered by patient's insurance., Disp: 200 strip, Rfl: 4   hydrocortisone (ANUSOL-HC) 2.5 % rectal cream, Place 1 Application rectally 2 (two) times daily. Use for 10 days, Disp: 30 g, Rfl: 0   Lancet Device MISC, 1 each by Does not apply route in the morning and at bedtime. May substitute to any manufacturer covered by patient's insurance., Disp: 1 each, Rfl: 0   Lancets Misc. MISC, 1 each by Does not apply route in the morning and at bedtime. May substitute to any manufacturer covered by patient's insurance., Disp: 200 each, Rfl: 4   lisinopril-hydrochlorothiazide (ZESTORETIC) 20-25 MG tablet, Take 1 tablet by mouth daily., Disp: 90 tablet, Rfl: 3   metFORMIN (GLUCOPHAGE) 1000 MG tablet, TAKE 1 TABLET(1000 MG) BY MOUTH TWICE DAILY WITH A MEAL, Disp: 180 tablet, Rfl: 1   oxyCODONE-acetaminophen (PERCOCET) 10-325 MG tablet, Take 1 tablet  by mouth 4 (four) times daily as needed for pain., Disp: , Rfl:    simvastatin (ZOCOR) 10 MG tablet, TAKE 1 TABLET(10 MG) BY MOUTH DAILY, Disp: 90 tablet, Rfl: 1   TRADJENTA 5 MG TABS tablet, TAKE 1 TABLET(5 MG) BY MOUTH DAILY, Disp: 90 tablet, Rfl: 3   TRULICITY 3 MG/0.5ML SOPN, Take 3 mg by mouth every Monday., Disp: 3 mL, Rfl: 1 Allergies  Allergen Reactions   Cymbalta [Duloxetine Hcl] Nausea And Vomiting and Other (See Comments)    Dizziness/headaches   Shrimp [Shellfish Allergy]     intolerant   Valium [Diazepam] Itching   Family History  Problem Relation Age of Onset   Heart disease Mother    Diabetes Mother    Amblyopia Neg Hx    Blindness Neg Hx    Cataracts Neg Hx    Hypertension Neg Hx    Macular degeneration Neg Hx    Retinal detachment Neg Hx    Strabismus Neg Hx    Esophageal cancer Neg Hx    Colon cancer Neg  Hx    Pancreatic cancer Neg Hx    Ulcerative colitis Neg Hx    Inflammatory bowel disease Neg Hx    Liver disease Neg Hx    Rectal cancer Neg Hx    Stomach cancer Neg Hx    Social History   Socioeconomic History   Marital status: Single    Spouse name: Not on file   Number of children: 3   Years of education: Not on file   Highest education level: Not on file  Occupational History   Occupation: disability  Tobacco Use   Smoking status: Former    Current packs/day: 0.00    Average packs/day: 0.3 packs/day for 25.0 years (6.3 ttl pk-yrs)    Types: Cigarettes    Start date: 04/27/1995    Quit date: 04/26/2020    Years since quitting: 2.9   Smokeless tobacco: Never  Vaping Use   Vaping status: Never Used  Substance and Sexual Activity   Alcohol use: Yes    Alcohol/week: 4.0 standard drinks of alcohol    Types: 4 Cans of beer per week    Comment: twice a week   Drug use: No   Sexual activity: Not Currently  Other Topics Concern   Not on file  Social History Narrative   Right handed   Lives alone in a one story home   Social Determinants  of Health   Financial Resource Strain: Patient Declined (09/23/2022)   Overall Financial Resource Strain (CARDIA)    Difficulty of Paying Living Expenses: Patient declined  Food Insecurity: Patient Declined (09/23/2022)   Hunger Vital Sign    Worried About Running Out of Food in the Last Year: Patient declined    Ran Out of Food in the Last Year: Patient declined  Transportation Needs: Patient Declined (09/23/2022)   PRAPARE - Administrator, Civil Service (Medical): Patient declined    Lack of Transportation (Non-Medical): Patient declined  Physical Activity: Insufficiently Active (02/24/2021)   Exercise Vital Sign    Days of Exercise per Week: 7 days    Minutes of Exercise per Session: 10 min  Stress: Not on file  Social Connections: Unknown (09/23/2022)   Social Connection and Isolation Panel [NHANES]    Frequency of Communication with Friends and Family: Patient declined    Frequency of Social Gatherings with Friends and Family: Patient declined    Attends Religious Services: Patient declined    Database administrator or Organizations: No    Attends Engineer, structural: Not on file    Marital Status: Patient declined  Intimate Partner Violence: Not At Risk (02/24/2021)   Humiliation, Afraid, Rape, and Kick questionnaire    Fear of Current or Ex-Partner: No    Emotionally Abused: No    Physically Abused: No    Sexually Abused: No    Physical Exam: There were no vitals filed for this visit. There is no height or weight on file to calculate BMI. GEN: NAD EYE: Sclerae anicteric ENT: MMM CV: Non-tachycardic GI: Soft, NT/ND NEURO:  Alert & Oriented x 3  Lab Results: No results for input(s): "WBC", "HGB", "HCT", "PLT" in the last 72 hours. BMET No results for input(s): "NA", "K", "CL", "CO2", "GLUCOSE", "BUN", "CREATININE", "CALCIUM" in the last 72 hours. LFT No results for input(s): "PROT", "ALBUMIN", "AST", "ALT", "ALKPHOS", "BILITOT", "BILIDIR", "IBILI"  in the last 72 hours. PT/INR No results for input(s): "LABPROT", "INR" in the last 72 hours.   Impression / Plan: This  is a Riley Clarke y.o.male who presents for Colonoscopy for screening/rectal bleeding evaluation.  The risks and benefits of endoscopic evaluation/treatment were discussed with the patient and/or family; these include but are not limited to the risk of perforation, infection, bleeding, missed lesions, lack of diagnosis, severe illness requiring hospitalization, as well as anesthesia and sedation related illnesses.  The patient's history has been reviewed, patient examined, no change in status, and deemed stable for procedure.  The patient and/or family is agreeable to proceed.    Corliss Parish, MD [facility] Office # [phone]

## 2023-03-29 ENCOUNTER — Telehealth: Payer: Self-pay

## 2023-03-29 ENCOUNTER — Encounter: Payer: Self-pay | Admitting: Podiatry

## 2023-03-29 ENCOUNTER — Ambulatory Visit: Payer: 59 | Admitting: Podiatry

## 2023-03-29 DIAGNOSIS — M722 Plantar fascial fibromatosis: Secondary | ICD-10-CM

## 2023-03-29 MED ORDER — TRIAMCINOLONE ACETONIDE 10 MG/ML IJ SUSP
10.0000 mg | Freq: Once | INTRAMUSCULAR | Status: AC
Start: 2023-03-29 — End: 2023-03-29
  Administered 2023-03-29: 10 mg via INTRA_ARTICULAR

## 2023-03-29 NOTE — Telephone Encounter (Signed)
  Follow up Call-     03/28/2023    9:05 AM  Call back number  Post procedure Call Back phone  # [phone]  Permission to leave phone message Yes     Patient questions:  Do you have a fever, pain , or abdominal swelling? No. Pain Score  0 *  Have you tolerated food without any problems? Yes.    Have you been able to return to your normal activities? Yes.    Do you have any questions about your discharge instructions: Diet   No. Medications  No. Follow up visit  No.  Do you have questions or concerns about your Care? No.  Actions: * If pain score is 4 or above: No action needed, pain <4.

## 2023-03-29 NOTE — Progress Notes (Signed)
Subjective:   Patient ID: Riley Clarke, male   DOB: 67 y.o.   MRN: [id number]   HPI Patient presents with heel pain bilateral with inflammation fluid around the medial band   ROS      Objective:  Physical Exam  Neurovascular status intact inflammation pain of the heel region bilateral at the insertion calcaneus     Assessment:  Plantar fasciitis bilateral with inflammation     Plan:  Sterile prep reinjected the fascia as he is not a good surgical candidate is the only thing that helps him 3 mg Kenalog 5 mg Xylocaine tolerated well

## 2023-03-30 ENCOUNTER — Ambulatory Visit: Payer: 59 | Admitting: Podiatry

## 2023-03-30 LAB — SURGICAL PATHOLOGY

## 2023-04-05 ENCOUNTER — Encounter: Payer: Self-pay | Admitting: Gastroenterology

## 2023-04-16 ENCOUNTER — Encounter: Payer: Self-pay | Admitting: Nurse Practitioner

## 2023-04-16 ENCOUNTER — Ambulatory Visit (INDEPENDENT_AMBULATORY_CARE_PROVIDER_SITE_OTHER): Payer: 59 | Admitting: Nurse Practitioner

## 2023-04-16 VITALS — BP 136/65 | HR 82 | Temp 97.0°F | Wt 223.0 lb

## 2023-04-16 DIAGNOSIS — Z23 Encounter for immunization: Secondary | ICD-10-CM

## 2023-04-16 DIAGNOSIS — E119 Type 2 diabetes mellitus without complications: Secondary | ICD-10-CM

## 2023-04-16 LAB — POCT GLYCOSYLATED HEMOGLOBIN (HGB A1C): Hemoglobin A1C: 8.1 % — AB (ref 4.0–5.6)

## 2023-04-16 NOTE — Progress Notes (Signed)
Subjective   Patient ID: Riley Clarke, male    DOB: 01/29/1956, 67 y.o.   MRN: [id number]  Chief Complaint  Patient presents with   Consult    Need note for    Back Pain    Referring provider: Ivonne Andrew, NP  Lyman Speller Robards is a 67 y.o. male with Past Medical History: No date: Arthritis No date: Chronic back pain     Comment:  on chronic opioids No date: Diabetes mellitus without complication (HCC) No date: GERD (gastroesophageal reflux disease) No date: Headache(784.0) No date: HLD (hyperlipidemia) No date: Hypertension     Comment:  no meds No date: Vitamin D deficiency  HPI  Patient presents today for follow-up visit.  Overall he has been doing well other than back pain.  He states that he is compliant with medications.  A1c in office today is 8.1.  Denies f/c/s, n/v/d, hemoptysis, PND, leg swelling Denies chest pain or edema  Patient is requesting a note to be excused from jury duty.  He does have chronic back pain with lumbar stenosis.  He did reinjure his back with heavy lifting  - last week trying to change tire.   Denies f/c/s, n/v/d, hemoptysis, PND, leg swelling Denies chest pain or edema       Allergies  Allergen Reactions   Cymbalta [Duloxetine Hcl] Nausea And Vomiting and Other (See Comments)    Dizziness/headaches   Shrimp [Shellfish Allergy]     intolerant   Valium [Diazepam] Itching    Immunization History  Administered Date(s) Administered   Influenza,inj,Quad PF,6+ Mos 04/01/2015, 02/04/2016, 02/19/2017, 04/02/2018, 02/02/2021   Moderna SARS-COV2 Booster Vaccination 05/04/2020   Moderna Sars-Covid-2 Vaccination 08/27/2019, 09/24/2019   Pneumococcal Conjugate-13 04/01/2015, 10/11/2022   Pneumococcal Polysaccharide-23 05/08/2016   Tdap 09/06/2014, 04/01/2015    Tobacco History: Social History   Tobacco Use  Smoking Status Former   Current packs/day: 0.00   Average packs/day: 0.3 packs/day for 25.0 years (6.3 ttl pk-yrs)    Types: Cigarettes   Start date: 04/27/1995   Quit date: 04/26/2020   Years since quitting: 2.9  Smokeless Tobacco Never   Counseling given: Not Answered   Outpatient Encounter Medications as of 04/16/2023  Medication Sig   Blood Glucose Monitoring Suppl DEVI 1 each by Does not apply route in the morning and at bedtime. May substitute to any manufacturer covered by patient's insurance.   cyclobenzaprine (FLEXERIL) 10 MG tablet Take 1 tablet (10 mg total) by mouth 3 (three) times daily as needed for muscle spasms.   empagliflozin (JARDIANCE) 25 MG TABS tablet Take 1 tablet (25 mg total) by mouth daily.   esomeprazole (NEXIUM) 40 MG capsule TAKE ONE (1) CAPSULE BY MOUTH EVERY MORNING BEFORE BREAKFAST   gabapentin (NEURONTIN) 300 MG capsule TAKE 1 CAPSULE(300 MG) BY MOUTH THREE TIMES DAILY   hydrocortisone (ANUSOL-HC) 2.5 % rectal cream Place 1 Application rectally 2 (two) times daily. Use for 10 days   lisinopril-hydrochlorothiazide (ZESTORETIC) 20-25 MG tablet Take 1 tablet by mouth daily.   metFORMIN (GLUCOPHAGE) 1000 MG tablet TAKE 1 TABLET(1000 MG) BY MOUTH TWICE DAILY WITH A MEAL   oxyCODONE-acetaminophen (PERCOCET) 10-325 MG tablet Take 1 tablet by mouth 4 (four) times daily as needed for pain.   simvastatin (ZOCOR) 10 MG tablet TAKE 1 TABLET(10 MG) BY MOUTH DAILY   TRADJENTA 5 MG TABS tablet TAKE 1 TABLET(5 MG) BY MOUTH DAILY   TRULICITY 3 MG/0.5ML SOPN Take 3 mg by mouth every Monday.  No facility-administered encounter medications on file as of 04/16/2023.    Review of Systems  Review of Systems  HENT: Negative.       Objective:   BP 136/65   Pulse 82   Temp (!) 97 F (36.1 C)   Wt 223 lb (101.2 kg)   SpO2 100%   BMI 30.24 kg/m   Wt Readings from Last 5 Encounters:  04/16/23 223 lb (101.2 kg)  03/28/23 226 lb (102.5 kg)  03/21/23 224 lb (101.6 kg)  03/15/23 226 lb (102.5 kg)  03/13/23 226 lb (102.5 kg)     Physical Exam Vitals and nursing note reviewed.   Constitutional:      General: He is not in acute distress.    Appearance: He is well-developed.  Cardiovascular:     Rate and Rhythm: Regular rhythm.  Pulmonary:     Effort: Pulmonary effort is normal.     Breath sounds: Normal breath sounds.  Skin:    General: Skin is warm and dry.  Neurological:     Mental Status: He is alert and oriented to person, place, and time.       Assessment & Plan:   Type 2 diabetes mellitus without complication, without long-term current use of insulin (HCC) -     CBC -     Comprehensive metabolic panel -     POCT glycosylated hemoglobin (Hb A1C)     Return in about 6 months (around 10/14/2023).   Ivonne Andrew, NP 04/16/2023

## 2023-04-16 NOTE — Patient Instructions (Signed)
1. Type 2 diabetes mellitus without complication, without long-term current use of insulin (HCC)  - CBC - Comprehensive metabolic panel - HgB A1c   Follow up:  Follow up in 3 months

## 2023-04-17 ENCOUNTER — Other Ambulatory Visit: Payer: Self-pay | Admitting: Nurse Practitioner

## 2023-04-17 DIAGNOSIS — K219 Gastro-esophageal reflux disease without esophagitis: Secondary | ICD-10-CM

## 2023-04-20 ENCOUNTER — Encounter: Payer: Self-pay | Admitting: Nurse Practitioner

## 2023-04-20 ENCOUNTER — Other Ambulatory Visit: Payer: Self-pay

## 2023-04-20 ENCOUNTER — Ambulatory Visit (INDEPENDENT_AMBULATORY_CARE_PROVIDER_SITE_OTHER): Payer: 59 | Admitting: Nurse Practitioner

## 2023-04-20 VITALS — BP 118/70 | HR 96 | Ht 72.0 in | Wt 221.5 lb

## 2023-04-20 DIAGNOSIS — K649 Unspecified hemorrhoids: Secondary | ICD-10-CM

## 2023-04-20 MED ORDER — GABAPENTIN 300 MG PO CAPS: 300.0000 mg | ORAL_CAPSULE | Freq: Three times a day (TID) | ORAL | 3 refills | Status: DC

## 2023-04-20 NOTE — Patient Instructions (Addendum)
Drink 6-8 glasses of water daily.  Fibercon- take 2 tablets by mouth daily (over the counter)  Contact our office if rectal bleeding recurs.  Due to recent changes in healthcare laws, you may see the results of your imaging and laboratory studies on MyChart before your provider has had a chance to review them.  We understand that in some cases there may be results that are confusing or concerning to you. Not all laboratory results come back in the same time frame and the provider may be waiting for multiple results in order to interpret others.  Please give [place] 48 hours in order for your provider to thoroughly review all the results before contacting the office for clarification of your results.   Thank you for trusting me with your gastrointestinal care!   Alcide Evener, CRNP

## 2023-04-20 NOTE — Telephone Encounter (Signed)
Please advise Kh

## 2023-04-20 NOTE — Progress Notes (Signed)
Attending Physician's Attestation   I have reviewed the chart.   I agree with the Advanced Practitioner's note, impression, and recommendations with any updates as below.    Emersyn Wyss Mansouraty, MD [facility] Office # [phone]  

## 2023-04-20 NOTE — Progress Notes (Signed)
04/20/2023 Riley Clarke [id number] [birth year]   Chief Complaint: Follow-up after colonoscopy  History of Present Illness: Riley Clarke is a 67 year old male with a past medical history of arthritis, hypertension, DM II and chronic constipation. History of cervical disc surgery, lumbar fusion and left rotator cuff surgery.  He presents today for follow-up after completing a colonoscopy 03/28/2023 which identified internal hemorrhoids and two 3 to 7 mm polyps were removed at the rectosigmoid and transverse colon. Pathology report confirmed a benign inflammatory polyp and a hyperplastic polyp and a repeat colonoscopy in 10 years was recommended. Future hemorrhoidal banding was recommended if hemorrhoidal bleeding persisted. He stated feeling quite well today. He used Anusol cream twice daily for about 1 week and his hemorrhoidal bleeding abated. No further constipation or rectal discomfort. He drinks at least 6 glasses of water/tea daily. He is not taking a fiber supplement.     Latest Ref Rng & Units 01/12/2023    3:10 PM 09/10/2022    6:20 PM 08/31/2022    1:30 PM  CBC  WBC 3.4 - 10.8 x10E3/uL 8.6  9.7  9.1   Hemoglobin 13.0 - 17.7 g/dL 40.9  81.1  91.4   Hematocrit 37.5 - 51.0 % 40.1  42.6  43.5   Platelets 150 - 450 x10E3/uL 232  248  232         Latest Ref Rng & Units 01/12/2023    3:10 PM 09/10/2022    6:20 PM 08/31/2022    1:30 PM  CMP  Glucose 70 - 99 mg/dL 782  956  213   BUN 8 - 27 mg/dL 8  15  9    Creatinine 0.76 - 1.27 mg/dL 0.86  5.78  4.69   Sodium 134 - 144 mmol/L 139  131  136   Potassium 3.5 - 5.2 mmol/L 3.9  3.6  4.3   Chloride 96 - 106 mmol/L 101  95  102   CO2 20 - 29 mmol/L 21  20  22    Calcium 8.6 - 10.2 mg/dL 9.1  9.6  9.0   Total Protein 6.0 - 8.5 g/dL 6.6     Total Bilirubin 0.0 - 1.2 mg/dL 0.3     Alkaline Phos 44 - 121 IU/L 126     AST 0 - 40 IU/L 18     ALT 0 - 44 IU/L 18        Colonoscopy 03/28/2023: - Hemorrhoids found on digital rectal  exam.  - Two 3 to 7 mm polyps at the recto-sigmoid colon and in the transverse colon, removed with a cold snare. Resected and retrieved.  - Diverticulosis in the entire examined colon.  - Normal mucosa in the entire examined colon otherwise.  - Non-bleeding non-thrombosed internal hemorrhoids - 10 year recall colonoscopy  1. Surgical [P], colon, rectosigmoid and transverse, polyp (2) :       BENIGN INFLAMMATORY POLYP       HYPERPLASTIC POLYP       NEGATIVE FOR DYSPLASIA AND CARCINOMA   Past Medical History:  Diagnosis Date   Arthritis    Chronic back pain    on chronic opioids   Diabetes mellitus without complication (HCC)    GERD (gastroesophageal reflux disease)    Headache(784.0)    HLD (hyperlipidemia)    Hypertension    no meds   Vitamin D deficiency    Past Surgical History:  Procedure Laterality Date   ANTERIOR CERVICAL DECOMP/DISCECTOMY FUSION N/A 09/05/2022  Procedure: Anterior Cervical Decompression Fusion  - Cervical three-Cervical four;  Surgeon: Julio Sicks, MD;  Location: [facility] OR;  Service: Neurosurgery;  Laterality: N/A;   CERVICAL DISC SURGERY  99   LUMBAR FUSION     LUMBAR LAMINECTOMY/DECOMPRESSION MICRODISCECTOMY Bilateral 03/09/2021   Procedure: Laminectomy and Foraminotomy - bilateral - Lumbar two-Lumbar three;  Surgeon: Julio Sicks, MD;  Location: [facility] OR;  Service: Neurosurgery;  Laterality: Bilateral;   ROTATOR CUFF REPAIR Bilateral    SHOULDER ARTHROSCOPY WITH ROTATOR CUFF REPAIR AND SUBACROMIAL DECOMPRESSION Right 12/30/2018   Procedure: SHOULDER ARTHROSCOPY WITH ROTATOR CUFF REPAIR AND SUBACROMIAL DECOMPRESSION, DISTAL CLAVICLE EXICISION,;  Surgeon: Bjorn Pippin, MD;  Location: [facility];  Service: Orthopedics;  Laterality: Right;   SHOULDER ARTHROSCOPY WITH SUBACROMIAL DECOMPRESSION, ROTATOR CUFF REPAIR AND BICEP TENDON REPAIR Left 06/19/2019   Procedure: LEFT SHOULDER ARTHROSCOPY, EXTENTSIVE DEBRIDEMENT, DISTAL CLAVICULECTOMY, SUBACROMIAL  DECOMPRESSION, PARTIAL ACROMIOPLASTY WITH CORACOACROMIAL RELEASE, ROTATOR CUFFF REPAIR AND BICEP TENODESIS;  Surgeon: Bjorn Pippin, MD;  Location:  SURGERY CENTER;  Service: Orthopedics;  Laterality: Left;    Current Outpatient Medications on File Prior to Visit  Medication Sig Dispense Refill   Blood Glucose Monitoring Suppl DEVI 1 each by Does not apply route in the morning and at bedtime. May substitute to any manufacturer covered by patient's insurance. 1 each 0   empagliflozin (JARDIANCE) 25 MG TABS tablet Take 1 tablet (25 mg total) by mouth daily. 90 tablet 3   esomeprazole (NEXIUM) 40 MG capsule TAKE 1 CAPSULE BY MOUTH EVERY MORNING BEFORE BREAKFAST 90 capsule 1   gabapentin (NEURONTIN) 300 MG capsule TAKE 1 CAPSULE(300 MG) BY MOUTH THREE TIMES DAILY 270 capsule 3   hydrocortisone (ANUSOL-HC) 2.5 % rectal cream Place 1 Application rectally 2 (two) times daily. Use for 10 days 30 g 0   lisinopril-hydrochlorothiazide (ZESTORETIC) 20-25 MG tablet Take 1 tablet by mouth daily. 90 tablet 3   metFORMIN (GLUCOPHAGE) 1000 MG tablet TAKE 1 TABLET(1000 MG) BY MOUTH TWICE DAILY WITH A MEAL 180 tablet 1   simvastatin (ZOCOR) 10 MG tablet TAKE 1 TABLET(10 MG) BY MOUTH DAILY 90 tablet 1   TRADJENTA 5 MG TABS tablet TAKE 1 TABLET(5 MG) BY MOUTH DAILY 90 tablet 3   TRULICITY 3 MG/0.5ML SOPN Take 3 mg by mouth every Monday. 3 mL 1   cyclobenzaprine (FLEXERIL) 10 MG tablet Take 1 tablet (10 mg total) by mouth 3 (three) times daily as needed for muscle spasms. (Patient not taking: Reported on 04/20/2023) 30 tablet 0   oxyCODONE-acetaminophen (PERCOCET) 10-325 MG tablet Take 1 tablet by mouth 4 (four) times daily as needed for pain. (Patient not taking: Reported on 04/20/2023)     No current facility-administered medications on file prior to visit.   Allergies  Allergen Reactions   Cymbalta [Duloxetine Hcl] Nausea And Vomiting and Other (See Comments)    Dizziness/headaches   Shrimp [Shellfish  Allergy]     intolerant   Valium [Diazepam] Itching   Current Medications, Allergies, Past Medical History, Past Surgical History, Family History and Social History were reviewed in [employer] record.  Review of Systems:   Constitutional: Negative for fever, sweats, chills or weight loss.  Respiratory: Negative for shortness of breath.   Cardiovascular: Negative for chest pain, palpitations and leg swelling.  Gastrointestinal: See HPI.  Musculoskeletal: Negative for back pain or muscle aches.  Neurological: Negative for dizziness, headaches or paresthesias.   Physical Exam: BP 118/70   Pulse 96   Ht  6' (1.829 m)   Wt 221 lb 8 oz (100.5 kg)   BMI 30.04 kg/m   General: 67 year old male in no acute distress. Head: Normocephalic and atraumatic. Eyes: No scleral icterus. Conjunctiva pink . Ears: Normal auditory acuity. Lungs: Clear throughout to auscultation. Heart: Regular rate and rhythm, no murmur. Abdomen: Soft, nontender and nondistended. No masses or hepatomegaly. Normal bowel sounds x 4 quadrants.  Rectal: Patient declined as rectal bleeding/hemorrhoidal symptoms abated. Musculoskeletal: Symmetrical with no gross deformities. Extremities: No edema. Neurological: Alert oriented x 4. No focal deficits.  Psychological: Alert and cooperative. Normal mood and affect  Assessment and Recommendations:  66 year old male with internal hemorrhoids and rectal bleeding which abated after he used Anusol cream twice daily for about 1 week.  Patient does not wish to pursue hemorrhoidal banding at this time.  Colonoscopy 03/2023 showed nonbleeding nonthrombosed internal hemorrhoids and 2 benign colon polyps. -Patient instructed to drink 6 to 8 glasses of water daily -FiberCon 2 tabs p.o. daily avoid straining -Patient to contact her office if rectal bleeding or hemorrhoidal swelling/irritation recurs -Consider internal hemorrhoid banding if hemorrhoidal symptoms  recur  History of colon polyps.  Colonoscopy 03/2023 identified 2 polyps removed from the colon, path report showed 1 benign inflammatory polyp and 1 hyperplastic polyp -Next colonoscopy due 03/2033

## 2023-05-03 ENCOUNTER — Ambulatory Visit (HOSPITAL_BASED_OUTPATIENT_CLINIC_OR_DEPARTMENT_OTHER): Admit: 2023-05-03 | Payer: 59 | Admitting: Orthopaedic Surgery

## 2023-05-03 ENCOUNTER — Encounter (HOSPITAL_BASED_OUTPATIENT_CLINIC_OR_DEPARTMENT_OTHER): Payer: Self-pay

## 2023-05-03 SURGERY — ARTHROPLASTY, SHOULDER, TOTAL, REVERSE
Anesthesia: General | Site: Shoulder | Laterality: Left

## 2023-05-08 ENCOUNTER — Telehealth: Payer: Self-pay

## 2023-05-08 ENCOUNTER — Encounter: Payer: Self-pay | Admitting: Cardiology

## 2023-05-08 NOTE — Progress Notes (Unsigned)
Cardiology Office Note   Date:  05/09/2023   ID:  Riley Clarke, DOB 13-Mar-1956, MRN [id number]  PCP:  Ivonne Andrew, NP  Cardiologist:   Rollene Rotunda, MD Referring:  Ivonne Andrew, NP   Chief Complaint  Patient presents with   Abnormal ECG      History of Present Illness: Riley Clarke is a 67 y.o. male who presents for evaluation of palpitations.  He had a perfusion study in 2014 that was negative for ischemia.  This was done preoperatively.  He is otherwise not had any cardiac issues.  He is limited by some back pain but he tries to walk around the block to get some exercise. The patient denies any new symptoms such as chest discomfort, neck or arm discomfort. There has been no new shortness of breath, PND or orthopnea. There have been no reported palpitations, presyncope or syncope.   He was referred by Ivonne Andrew, NP.  Back in October he was noted to have some ectopy.  He had an EKG that I was not able to review.  The EKG is listed as "not released."  And I cannot find a copy of it in our system.  It was mentioned to be bigeminy.  He says he was not feeling and that day.    He does not have symptoms related to this.   Past Medical History:  Diagnosis Date   Arthritis    Chronic back pain    on chronic opioids   Diabetes mellitus without complication (HCC)    GERD (gastroesophageal reflux disease)    HLD (hyperlipidemia)    Hypertension    no meds   Vitamin D deficiency     Past Surgical History:  Procedure Laterality Date   ANTERIOR CERVICAL DECOMP/DISCECTOMY FUSION N/A 09/05/2022   Procedure: Anterior Cervical Decompression Fusion  - Cervical three-Cervical four;  Surgeon: Julio Sicks, MD;  Location: [facility] OR;  Service: Neurosurgery;  Laterality: N/A;   CERVICAL DISC SURGERY  99   LUMBAR FUSION     LUMBAR LAMINECTOMY/DECOMPRESSION MICRODISCECTOMY Bilateral 03/09/2021   Procedure: Laminectomy and Foraminotomy - bilateral - Lumbar two-Lumbar three;   Surgeon: Julio Sicks, MD;  Location: [facility] OR;  Service: Neurosurgery;  Laterality: Bilateral;   ROTATOR CUFF REPAIR Bilateral    SHOULDER ARTHROSCOPY WITH ROTATOR CUFF REPAIR AND SUBACROMIAL DECOMPRESSION Right 12/30/2018   Procedure: SHOULDER ARTHROSCOPY WITH ROTATOR CUFF REPAIR AND SUBACROMIAL DECOMPRESSION, DISTAL CLAVICLE EXICISION,;  Surgeon: Bjorn Pippin, MD;  Location: [facility];  Service: Orthopedics;  Laterality: Right;   SHOULDER ARTHROSCOPY WITH SUBACROMIAL DECOMPRESSION, ROTATOR CUFF REPAIR AND BICEP TENDON REPAIR Left 06/19/2019   Procedure: LEFT SHOULDER ARTHROSCOPY, EXTENTSIVE DEBRIDEMENT, DISTAL CLAVICULECTOMY, SUBACROMIAL DECOMPRESSION, PARTIAL ACROMIOPLASTY WITH CORACOACROMIAL RELEASE, ROTATOR CUFFF REPAIR AND BICEP TENODESIS;  Surgeon: Bjorn Pippin, MD;  Location: [facility];  Service: Orthopedics;  Laterality: Left;     Current Outpatient Medications  Medication Sig Dispense Refill   Blood Glucose Monitoring Suppl DEVI 1 each by Does not apply route in the morning and at bedtime. May substitute to any manufacturer covered by patient's insurance. 1 each 0   cyclobenzaprine (FLEXERIL) 10 MG tablet Take 1 tablet (10 mg total) by mouth 3 (three) times daily as needed for muscle spasms. 30 tablet 0   empagliflozin (JARDIANCE) 25 MG TABS tablet Take 1 tablet (25 mg total) by mouth daily. 90 tablet 3   esomeprazole (NEXIUM) 40 MG capsule TAKE 1 CAPSULE BY  MOUTH EVERY MORNING BEFORE BREAKFAST 90 capsule 1   gabapentin (NEURONTIN) 300 MG capsule Take 1 capsule (300 mg total) by mouth 3 (three) times daily. 270 capsule 3   hydrocortisone (ANUSOL-HC) 2.5 % rectal cream Place 1 Application rectally 2 (two) times daily. Use for 10 days 30 g 0   lisinopril-hydrochlorothiazide (ZESTORETIC) 20-25 MG tablet Take 1 tablet by mouth daily. 90 tablet 3   metFORMIN (GLUCOPHAGE) 1000 MG tablet TAKE 1 TABLET(1000 MG) BY MOUTH TWICE DAILY WITH A MEAL 180 tablet 1    oxyCODONE-acetaminophen (PERCOCET) 10-325 MG tablet Take 1 tablet by mouth 4 (four) times daily as needed for pain.     simvastatin (ZOCOR) 10 MG tablet TAKE 1 TABLET(10 MG) BY MOUTH DAILY 90 tablet 1   TRADJENTA 5 MG TABS tablet TAKE 1 TABLET(5 MG) BY MOUTH DAILY 90 tablet 3   TRULICITY 3 MG/0.5ML SOPN Take 3 mg by mouth every Monday. 3 mL 1   No current facility-administered medications for this visit.    Allergies:   Cymbalta [duloxetine hcl], Shrimp [shellfish allergy], and Valium [diazepam]    Social History:  The patient  reports that he quit smoking about 3 years ago. His smoking use included cigarettes. He started smoking about 28 years ago. He has a 6.3 pack-year smoking history. He has never used smokeless tobacco. He reports current alcohol use of about 4.0 standard drinks of alcohol per week. He reports that he does not use drugs.   Family History:  The patient's family history includes Diabetes in his mother; Heart disease in his mother.    ROS:  Please see the history of present illness.   Otherwise, review of systems are positive for none.   All other systems are reviewed and negative.    PHYSICAL EXAM: VS:  BP 136/70 (BP Location: Left Arm, Patient Position: Sitting, Cuff Size: Normal)   Pulse 82   Ht 6' (1.829 m)   Wt 224 lb 9.6 oz (101.9 kg)   SpO2 97%   BMI 30.46 kg/m  , BMI Body mass index is 30.46 kg/m. GENERAL:  Well appearing HEENT:  Pupils equal round and reactive, fundi not visualized, oral mucosa unremarkable NECK:  No jugular venous distention, waveform within normal limits, carotid upstroke brisk and symmetric, no bruits, no thyromegaly LYMPHATICS:  No cervical, inguinal adenopathy LUNGS:  Clear to auscultation bilaterally BACK:  No CVA tenderness CHEST:  Unremarkable HEART:  PMI not displaced or sustained,S1 and S2 within normal limits, no S3, no S4, no clicks, no rubs, no murmurs ABD:  Flat, positive bowel sounds normal in frequency in pitch, no  bruits, no rebound, no guarding, no midline pulsatile mass, no hepatomegaly, no splenomegaly EXT:  2 plus pulses throughout, no edema, no cyanosis no clubbing SKIN:  No rashes no nodules NEURO:  Cranial nerves II through XII grossly intact, motor grossly intact throughout [facility]:  Cognitively intact, oriented to person place and time    EKG:  EKG Interpretation Date/Time:  Wednesday May 09 2023 09:47:48 EST Ventricular Rate:  82 PR Interval:  130 QRS Duration:  76 QT Interval:  364 QTC Calculation: 425 R Axis:   0  Text Interpretation: Normal sinus rhythm When compared with ECG of 31-Aug-2022 13:21, No significant change was found Confirmed by Rollene Rotunda ([id number]) on 05/09/2023 9:56:36 AM     Recent Labs: 01/12/2023: ALT 18; BUN 8; Creatinine, Ser 0.82; Hemoglobin 13.1; Platelets 232; Potassium 3.9; Sodium 139    Lipid Panel    Component  Value Date/Time   CHOL 96 (L) 06/28/2022 1449   TRIG 128 06/28/2022 1449   HDL 46 06/28/2022 1449   CHOLHDL 2.1 06/28/2022 1449   CHOLHDL 2.2 08/14/2016 1546   VLDL 32 (H) 08/14/2016 1546   LDLCALC 28 06/28/2022 1449      Wt Readings from Last 3 Encounters:  05/09/23 224 lb 9.6 oz (101.9 kg)  04/20/23 221 lb 8 oz (100.5 kg)  04/16/23 223 lb (101.2 kg)      Other studies Reviewed: Additional studies/ records that were reviewed today include: Labs. Review of the above records demonstrates:  Please see elsewhere in the note.     ASSESSMENT AND PLAN:  Palpitations: The patient does not really notice any ectopy.  He is not feeling palpitations and no further workup by do not think is indicated.    DM: His A1c is 8.1.  We had a long discussion about this.  In particular we had a long discussion about diet.  He can particularly cut down his carbohydrates.  We talked about how maybe to increase his physical activity as he might have access to the swimming pool.     Current medicines are reviewed at length with the patient today.   The patient does not have concerns regarding medicines.  The following changes have been made:  no change  Labs/ tests ordered today include:   Orders Placed This Encounter  Procedures   EKG 12-Lead     Disposition:   FU with me as needed.     Signed, Rollene Rotunda, MD  05/09/2023 10:49 AM    [facility]

## 2023-05-08 NOTE — Telephone Encounter (Signed)
Copied from CRM [id number]. Topic: Clinical - Medication Refill >> May 08, 2023 11:32 AM Riley Clarke wrote: Most Recent Primary Care Visit:  Provider: Ivonne Andrew  Department: [facility]  Visit Type: OFFICE VISIT  Date: 04/16/2023  Medication: Muscle Relaxer  Has the patient contacted their pharmacy? No (Agent: If no, request that the patient contact the pharmacy for the refill. If patient does not wish to contact the pharmacy document the reason why and proceed with request.) (Agent: If yes, when and what did the pharmacy advise?)  Patient was recently seen in providers office. Patient would like to request prescription medication for muscle relaxer. Patient has not contacted pharmacy as this is not a medication that he is currently taking.   Is this the correct pharmacy for this prescription? Yes If no, delete pharmacy and type the correct one.  This is the patient's preferred pharmacy:   [facility] #[id number] [place], [place] - [address] AT [facility] [address] [name] [address] Phone: [phone] Fax: [phone]   Has the prescription been filled recently? No  Is the patient out of the medication? No  Has the patient been seen for an appointment in the last year OR does the patient have an upcoming appointment? Yes  Can we respond through MyChart? Yes  Agent: Please be advised that Rx refills may take up to 3 business days. We ask that you follow-up with your pharmacy.  Please advise [facility]

## 2023-05-09 ENCOUNTER — Encounter: Payer: Self-pay | Admitting: Cardiology

## 2023-05-09 ENCOUNTER — Other Ambulatory Visit: Payer: Self-pay

## 2023-05-09 ENCOUNTER — Ambulatory Visit: Payer: 59 | Attending: Cardiology | Admitting: Cardiology

## 2023-05-09 VITALS — BP 136/70 | HR 82 | Ht 72.0 in | Wt 224.6 lb

## 2023-05-09 DIAGNOSIS — I10 Essential (primary) hypertension: Secondary | ICD-10-CM

## 2023-05-09 MED ORDER — CYCLOBENZAPRINE HCL 10 MG PO TABS: 10.0000 mg | ORAL_TABLET | Freq: Three times a day (TID) | ORAL | 0 refills | Status: DC | PRN

## 2023-05-09 NOTE — Telephone Encounter (Signed)
Caller & Relationship to patient:  MRN #  [id number]   Call Back Number:   Date of Last Office Visit: 04/20/2023     Date of Next Office Visit: 10/15/2023    Medication(s) to be Refilled: Flexeril   Preferred Pharmacy:   ** Please notify patient to allow 48-72 hours to process** **Let patient know to contact pharmacy at the end of the day to make sure medication is ready. ** **If patient has not been seen in a year or longer, book an appointment **Advise to use MyChart for refill requests OR to contact their pharmacy

## 2023-05-09 NOTE — Patient Instructions (Signed)
Medication Instructions:  No changes.  *If you need a refill on your cardiac medications before your next appointment, please call your pharmacy*     Follow-Up: At [facility], you and your health needs are our priority.  As part of our continuing mission to provide you with exceptional heart care, we have created designated Provider Care Teams.  These Care Teams include your primary Cardiologist (physician) and Advanced Practice Providers (APPs -  Physician Assistants and Nurse Practitioners) who all work together to provide you with the care you need, when you need it.  Your next appointment:    As needed.   Provider:   Rollene Rotunda, MD

## 2023-05-09 NOTE — Telephone Encounter (Signed)
Please advise Kh

## 2023-05-13 ENCOUNTER — Other Ambulatory Visit: Payer: Self-pay | Admitting: Nurse Practitioner

## 2023-05-13 MED ORDER — CYCLOBENZAPRINE HCL 10 MG PO TABS: 10.0000 mg | ORAL_TABLET | Freq: Three times a day (TID) | ORAL | 0 refills | Status: DC | PRN

## 2023-06-07 ENCOUNTER — Other Ambulatory Visit: Payer: Self-pay

## 2023-06-07 MED ORDER — METFORMIN HCL 1000 MG PO TABS: ORAL_TABLET | ORAL | 1 refills | Status: DC

## 2023-06-11 ENCOUNTER — Encounter: Payer: Self-pay | Admitting: Podiatry

## 2023-06-11 ENCOUNTER — Ambulatory Visit (INDEPENDENT_AMBULATORY_CARE_PROVIDER_SITE_OTHER): Payer: 59 | Admitting: Podiatry

## 2023-06-11 DIAGNOSIS — M722 Plantar fascial fibromatosis: Secondary | ICD-10-CM | POA: Diagnosis not present

## 2023-06-11 MED ORDER — TRIAMCINOLONE ACETONIDE 10 MG/ML IJ SUSP
10.0000 mg | Freq: Once | INTRAMUSCULAR | Status: AC
Start: 2023-06-11 — End: 2023-06-11
  Administered 2023-06-11: 10 mg via INTRA_ARTICULAR

## 2023-06-11 NOTE — Progress Notes (Signed)
Subjective:   Patient ID: Riley Clarke, male   DOB: 67 y.o.   MRN: [id number]   HPI Patient presents with a lot of pains in the heel bilateral understands this is not the optimal solution but not able to have surgery neuro   ROS      Objective:  Physical Exam  Vascular status intact with chronic discomfort in the plantar fascia bilateral heels acute     Assessment:  Fasciitis bilateral heels that is kept under control with injections     Plan:  H&P reviewed again he understands that we are trying to help keep this is a for him but he wants to continue this approach and I went ahead today did sterile prep and I injected the fascia at insertion calcaneus 3 mg Dexasone Kenalog 5 mg Xylocaine

## 2023-06-15 ENCOUNTER — Other Ambulatory Visit: Payer: Self-pay | Admitting: Nurse Practitioner

## 2023-06-15 ENCOUNTER — Ambulatory Visit: Payer: 59 | Admitting: Podiatry

## 2023-06-15 DIAGNOSIS — E119 Type 2 diabetes mellitus without complications: Secondary | ICD-10-CM

## 2023-08-09 ENCOUNTER — Other Ambulatory Visit: Payer: Self-pay

## 2023-08-09 MED ORDER — CYCLOBENZAPRINE HCL 10 MG PO TABS: 10.0000 mg | ORAL_TABLET | Freq: Three times a day (TID) | ORAL | 0 refills | Status: DC | PRN

## 2023-08-09 NOTE — Telephone Encounter (Signed)
 Please advise [facility]

## 2023-09-06 ENCOUNTER — Ambulatory Visit

## 2023-09-06 ENCOUNTER — Ambulatory Visit (INDEPENDENT_AMBULATORY_CARE_PROVIDER_SITE_OTHER): Admitting: Podiatry

## 2023-09-06 ENCOUNTER — Encounter: Payer: Self-pay | Admitting: Podiatry

## 2023-09-06 ENCOUNTER — Other Ambulatory Visit: Payer: Self-pay | Admitting: Nurse Practitioner

## 2023-09-06 DIAGNOSIS — M722 Plantar fascial fibromatosis: Secondary | ICD-10-CM

## 2023-09-06 MED ORDER — TRIAMCINOLONE ACETONIDE 10 MG/ML IJ SUSP
10.0000 mg | Freq: Once | INTRAMUSCULAR | Status: AC
Start: 2023-09-06 — End: 2023-09-06
  Administered 2023-09-06: 10 mg via INTRA_ARTICULAR

## 2023-09-06 NOTE — Progress Notes (Signed)
 Subjective:   Patient ID: Riley Clarke, male   DOB: 68 y.o.   MRN: [id number]   HPI Patient presents with exquisite discomfort in the bottom of both heels states they are hard to walk on did do well for almost 3 months   ROS      Objective:  Physical Exam  Neuro vascular status intact chronic discomfort medial fascial band bilateral that is very well with injections     Assessment:  Acute fasciitis bilateral with pain     Plan:  H&P reviewed sterile prep injected the plantar fascia at insertion bilateral 3 mg Kenalog 5 mg Xylocaine applied sterile dressing

## 2023-09-17 ENCOUNTER — Telehealth: Payer: Self-pay | Admitting: Nurse Practitioner

## 2023-09-17 NOTE — Telephone Encounter (Signed)
 Riley Clarke is requesting a refill of his Trulicity, please send to  [facility] #[id number] - [place],  - [address] AT [facility] [address]

## 2023-09-20 ENCOUNTER — Other Ambulatory Visit: Payer: Self-pay | Admitting: Nurse Practitioner

## 2023-09-20 DIAGNOSIS — E119 Type 2 diabetes mellitus without complications: Secondary | ICD-10-CM

## 2023-10-02 ENCOUNTER — Other Ambulatory Visit: Payer: Self-pay | Admitting: Nurse Practitioner

## 2023-10-02 DIAGNOSIS — I152 Hypertension secondary to endocrine disorders: Secondary | ICD-10-CM

## 2023-10-06 ENCOUNTER — Other Ambulatory Visit: Payer: Self-pay | Admitting: Nurse Practitioner

## 2023-10-08 DIAGNOSIS — G8929 Other chronic pain: Secondary | ICD-10-CM | POA: Insufficient documentation

## 2023-10-14 ENCOUNTER — Other Ambulatory Visit: Payer: Self-pay | Admitting: Nurse Practitioner

## 2023-10-15 ENCOUNTER — Ambulatory Visit (INDEPENDENT_AMBULATORY_CARE_PROVIDER_SITE_OTHER): Payer: Self-pay | Admitting: Nurse Practitioner

## 2023-10-15 ENCOUNTER — Encounter: Payer: Self-pay | Admitting: Nurse Practitioner

## 2023-10-15 VITALS — BP 135/66 | HR 90 | Temp 98.0°F | Wt 231.8 lb

## 2023-10-15 DIAGNOSIS — Z13220 Encounter for screening for lipoid disorders: Secondary | ICD-10-CM

## 2023-10-15 DIAGNOSIS — E119 Type 2 diabetes mellitus without complications: Secondary | ICD-10-CM | POA: Diagnosis not present

## 2023-10-15 DIAGNOSIS — K219 Gastro-esophageal reflux disease without esophagitis: Secondary | ICD-10-CM

## 2023-10-15 LAB — POCT GLYCOSYLATED HEMOGLOBIN (HGB A1C): Hemoglobin A1C: 10.3 % — AB (ref 4.0–5.6)

## 2023-10-15 MED ORDER — ESOMEPRAZOLE MAGNESIUM 40 MG PO CPDR: DELAYED_RELEASE_CAPSULE | ORAL | 1 refills | Status: DC

## 2023-10-15 NOTE — Patient Instructions (Signed)
 1. Type 2 diabetes mellitus without complication, without long-term current use of insulin  (HCC)  - Urine Albumin/Creatinine with ratio (send out) [LAB689] - POCT glycosylated hemoglobin (Hb A1C) - CBC - Comprehensive metabolic panel with GFR - Lipid Panel - esomeprazole  (NEXIUM ) 40 MG capsule; TAKE 1 CAPSULE BY MOUTH EVERY MORNING BEFORE BREAKFAST  Dispense: 90 capsule; Refill: 1 - AMB Referral [facility] Care Management  2. Gastroesophageal reflux disease without esophagitis  - esomeprazole  (NEXIUM ) 40 MG capsule; TAKE 1 CAPSULE BY MOUTH EVERY MORNING BEFORE BREAKFAST  Dispense: 90 capsule; Refill: 1  3. Lipid screening (Primary)  - Lipid Panel

## 2023-10-15 NOTE — Progress Notes (Unsigned)
 Subjective   Patient ID: Riley Clarke, male    DOB: 1956/05/30, 68 y.o.   MRN: [id number]  Chief Complaint  Patient presents with   Diabetes    Follow up    Referring provider: Jerrlyn Morel, NP  Riley Clarke is a 68 y.o. male with Past Medical History: No date: Arthritis No date: Chronic back pain     Comment:  on chronic opioids No date: Diabetes mellitus without complication (HCC) No date: GERD (gastroesophageal reflux disease) No date: HLD (hyperlipidemia) No date: Hypertension     Comment:  no meds No date: Vitamin D  deficiency   HPI  Patient presents today for follow-up visit.  Overall he has been doing well other than back pain.  He states that he is compliant with medications.  A1c in office today is 10.3.  referral has been placed to pharmacy for medication management for diabetes. Denies f/c/s, n/v/d, hemoptysis, PND, leg swelling Denies chest pain or edema.   Allergies  Allergen Reactions   Cymbalta [Duloxetine Hcl] Nausea And Vomiting and Other (See Comments)    Dizziness/headaches   Shrimp [Shellfish Allergy]     intolerant   Valium  [Diazepam ] Itching    Immunization History  Administered Date(s) Administered   Fluad Quad(high Dose 65+) 04/16/2023   Influenza,inj,Quad PF,6+ Mos 04/01/2015, 02/04/2016, 02/19/2017, 04/02/2018, 02/02/2021   Moderna SARS-COV2 Booster Vaccination 05/04/2020   Moderna Sars-Covid-2 Vaccination 08/27/2019, 09/24/2019   Pneumococcal Conjugate-13 04/01/2015, 10/11/2022   Pneumococcal Polysaccharide-23 05/08/2016   Tdap 09/06/2014, 04/01/2015    Tobacco History: Social History   Tobacco Use  Smoking Status Former   Current packs/day: 0.00   Average packs/day: 0.3 packs/day for 25.0 years (6.3 ttl pk-yrs)   Types: Cigarettes   Start date: 04/27/1995   Quit date: 04/26/2020   Years since quitting: 3.4  Smokeless Tobacco Never   Counseling given: Not Answered   Outpatient Encounter Medications as of  10/15/2023  Medication Sig   Blood Glucose Monitoring Suppl DEVI 1 each by Does not apply route in the morning and at bedtime. May substitute to any manufacturer covered by patient's insurance.   cyclobenzaprine  (FLEXERIL ) 10 MG tablet TAKE 1 TABLET(10 MG) BY MOUTH THREE TIMES DAILY AS NEEDED FOR MUSCLE SPASMS   empagliflozin  (JARDIANCE ) 25 MG TABS tablet Take 1 tablet (25 mg total) by mouth daily.   gabapentin  (NEURONTIN ) 300 MG capsule Take 1 capsule (300 mg total) by mouth 3 (three) times daily.   hydrocortisone  (ANUSOL -HC) 2.5 % rectal cream Place 1 Application rectally 2 (two) times daily. Use for 10 days   lisinopril -hydrochlorothiazide  (ZESTORETIC ) 20-25 MG tablet TAKE 1 TABLET BY MOUTH DAILY   metFORMIN  (GLUCOPHAGE ) 1000 MG tablet TAKE 1 TABLET(1000 MG) BY MOUTH TWICE DAILY WITH A MEAL   oxyCODONE -acetaminophen  (PERCOCET) 10-325 MG tablet Take 1 tablet by mouth 4 (four) times daily as needed for pain.   simvastatin  (ZOCOR ) 10 MG tablet TAKE 1 TABLET(10 MG) BY MOUTH DAILY   TRADJENTA  5 MG TABS tablet TAKE 1 TABLET(5 MG) BY MOUTH DAILY   TRULICITY  3 MG/0.5ML SOAJ INJECT 3 MG INTO THE SKIN EVERY MONDAY   [DISCONTINUED] esomeprazole  (NEXIUM ) 40 MG capsule TAKE 1 CAPSULE BY MOUTH EVERY MORNING BEFORE BREAKFAST   esomeprazole  (NEXIUM ) 40 MG capsule TAKE 1 CAPSULE BY MOUTH EVERY MORNING BEFORE BREAKFAST   No facility-administered encounter medications on file as of 10/15/2023.    Review of Systems  Review of Systems  Constitutional: Negative.   HENT: Negative.    Cardiovascular:  Negative.   Gastrointestinal: Negative.   Allergic/Immunologic: Negative.   Neurological: Negative.   Psychiatric/Behavioral: Negative.       Objective:   BP 135/66   Pulse 90   Temp 98 F (36.7 C) (Oral)   Wt 231 lb 12.8 oz (105.1 kg)   SpO2 98%   BMI 31.44 kg/m   Wt Readings from Last 5 Encounters:  10/15/23 231 lb 12.8 oz (105.1 kg)  05/09/23 224 lb 9.6 oz (101.9 kg)  04/20/23 221 lb 8 oz (100.5  kg)  04/16/23 223 lb (101.2 kg)  03/28/23 226 lb (102.5 kg)     Physical Exam Vitals and nursing note reviewed.  Constitutional:      General: He is not in acute distress.    Appearance: He is well-developed.  Cardiovascular:     Rate and Rhythm: Normal rate and regular rhythm.  Pulmonary:     Effort: Pulmonary effort is normal.     Breath sounds: Normal breath sounds.  Skin:    General: Skin is warm and dry.  Neurological:     Mental Status: He is alert and oriented to person, place, and time.     {Labs (Optional):23779}  Assessment & Plan:   Lipid screening -     Lipid panel  Type 2 diabetes mellitus without complication, without long-term current use of insulin  (HCC) -     Microalbumin / creatinine urine ratio -     POCT glycosylated hemoglobin (Hb A1C) -     CBC -     Comprehensive metabolic panel with GFR -     Lipid panel -     Esomeprazole  Magnesium ; TAKE 1 CAPSULE BY MOUTH EVERY MORNING BEFORE BREAKFAST  Dispense: 90 capsule; Refill: 1 -     AMB Referral [facility] Care Management  Gastroesophageal reflux disease without esophagitis -     Esomeprazole  Magnesium ; TAKE 1 CAPSULE BY MOUTH EVERY MORNING BEFORE BREAKFAST  Dispense: 90 capsule; Refill: 1     Return in about 3 months (around 01/15/2024).   Riley Morel, NP 10/15/2023

## 2023-10-16 LAB — LIPID PANEL
Chol/HDL Ratio: 1.9 ratio (ref 0.0–5.0)
Cholesterol, Total: 93 mg/dL — ABNORMAL LOW (ref 100–199)
HDL: 49 mg/dL (ref >39)
LDL Chol Calc (NIH): 22 mg/dL (ref 0–99)
Triglycerides: 129 mg/dL (ref 0–149)
VLDL Cholesterol Cal: 22 mg/dL (ref 5–40)

## 2023-10-16 LAB — COMPREHENSIVE METABOLIC PANEL WITH GFR
ALT: 15 IU/L (ref 0–44)
AST: 14 IU/L (ref 0–40)
Albumin: 4.3 g/dL (ref 3.9–4.9)
Alkaline Phosphatase: 147 IU/L — ABNORMAL HIGH (ref 44–121)
BUN/Creatinine Ratio: 14 (ref 10–24)
BUN: 11 mg/dL (ref 8–27)
Bilirubin Total: <0.2 mg/dL (ref 0.0–1.2)
CO2: 21 mmol/L (ref 20–29)
Calcium: 9.3 mg/dL (ref 8.6–10.2)
Chloride: 94 mmol/L — ABNORMAL LOW (ref 96–106)
Creatinine, Ser: 0.76 mg/dL (ref 0.76–1.27)
Globulin, Total: 2.4 g/dL (ref 1.5–4.5)
Glucose: 145 mg/dL — ABNORMAL HIGH (ref 70–99)
Potassium: 5 mmol/L (ref 3.5–5.2)
Sodium: 131 mmol/L — ABNORMAL LOW (ref 134–144)
Total Protein: 6.7 g/dL (ref 6.0–8.5)
eGFR: 99 mL/min/{1.73_m2} (ref >59)

## 2023-10-16 LAB — CBC
Hematocrit: 38.7 % (ref 37.5–51.0)
Hemoglobin: 12.4 g/dL — ABNORMAL LOW (ref 13.0–17.7)
MCH: 25 pg — ABNORMAL LOW (ref 26.6–33.0)
MCHC: 32 g/dL (ref 31.5–35.7)
MCV: 78 fL — ABNORMAL LOW (ref 79–97)
Platelets: 237 10*3/uL (ref 150–450)
RBC: 4.96 x10E6/uL (ref 4.14–5.80)
RDW: 15.8 % — ABNORMAL HIGH (ref 11.6–15.4)
WBC: 9.8 10*3/uL (ref 3.4–10.8)

## 2023-10-16 LAB — MICROALBUMIN / CREATININE URINE RATIO
Creatinine, Urine: 27.4 mg/dL
Microalb/Creat Ratio: <11 mg/g{creat} (ref 0–29)
Microalbumin, Urine: <3 ug/mL

## 2023-10-29 ENCOUNTER — Telehealth: Payer: Self-pay | Admitting: *Deleted

## 2023-10-29 NOTE — Progress Notes (Signed)
 Care Guide Pharmacy Note  10/29/2023 Name: Riley Clarke MRN: [id number] DOB: 06-Aug-1955  Referred By: Jerrlyn Morel, NP Reason for referral: Complex Care Management (Initial outreach to schedule referral with PharmD )   Riley Clarke is a 68 y.o. year old male who is a primary care patient of Jerrlyn Morel, NP.  Riley Clarke was referred to the pharmacist for assistance related to: DMII  Successful contact was made with the patient to discuss pharmacy services including being ready for the pharmacist to call at least 5 minutes before the scheduled appointment time and to have medication bottles and any blood pressure readings ready for review. The patient agreed to meet with the pharmacist via in office  on (date/time). 11/20/2023 at 1:30 PM   Barnie Bora  [facility] Guide  Direct Dial: [phone]  Fax [phone]

## 2023-11-05 ENCOUNTER — Other Ambulatory Visit: Payer: Self-pay | Admitting: Nurse Practitioner

## 2023-11-06 NOTE — Telephone Encounter (Signed)
 Please advise [facility]

## 2023-11-13 ENCOUNTER — Telehealth: Payer: Self-pay

## 2023-11-13 NOTE — Telephone Encounter (Signed)
 Copied from CRM [id number]. Topic: General - Phone/Fax/Address >> Nov 13, 2023  2:17 PM Antwanette L wrote: Sherri from [facility] is calling in about form that was faxed on 4/14 for surgical clearence. Sherri needs Abbey Hobby to fill out the form and provide records. Sherri can be contacted at [phone] and fax number is [phone]

## 2023-11-14 ENCOUNTER — Other Ambulatory Visit (HOSPITAL_COMMUNITY): Payer: Self-pay

## 2023-11-14 ENCOUNTER — Ambulatory Visit (INDEPENDENT_AMBULATORY_CARE_PROVIDER_SITE_OTHER)

## 2023-11-14 VITALS — BP 131/68 | HR 82 | Wt 233.8 lb

## 2023-11-14 DIAGNOSIS — E119 Type 2 diabetes mellitus without complications: Secondary | ICD-10-CM

## 2023-11-14 MED ORDER — MOUNJARO 5 MG/0.5ML ~~LOC~~ SOAJ: 5.0000 mg | SUBCUTANEOUS | 1 refills | Status: DC

## 2023-11-14 MED ORDER — MOUNJARO 7.5 MG/0.5ML ~~LOC~~ SOAJ: 7.5000 mg | SUBCUTANEOUS | 1 refills | Status: DC

## 2023-11-14 NOTE — Progress Notes (Addendum)
 11/14/2023 Name: Riley Clarke MRN: [id number] DOB: March 17, 1956  Chief Complaint  Patient presents with   Diabetes   Hypertension   Hyperlipidemia    Riley Clarke is a 68 y.o. year old male who was referred for medication management by their primary care provider, Jerrlyn Morel, NP. They presented for a face to face visit today.   They were referred to the pharmacist by their PCP for assistance in managing diabetes, hypertension, and hyperlipidemia . PMH includes HTN, T2DM, BMI > 30, opioid dependence for chronic pain.   Subjective: Patient was last seen by PCP, [name], on 10/15/23. BP was 135/66, HR 90. Patient reported taking his medications as prescribed. A1C had increased from 8.1% to 10.3%.   Patient reports doing well today. He does not know why his A1C increased. Denies changes in his diet, medication adherence, or lifestyle. He does admit to dietary indiscretion, but this is unchanged for him. He does not feel any appetite suppression on Trulicity  and denies nausea, vomiting, abdominal pain. He denies missed doses of any of his medications.   Care Team: Primary Care Provider: Jerrlyn Morel, NP ; Next Scheduled Visit: 01/16/24  Medication Access/Adherence  Current Pharmacy:  [id number] - [place], [place] - [address] AT [facility] [address] [place] [address] Phone: [phone] Fax: [phone]   Patient reports affordability concerns with their medications: No  - [employer] Dual Complete, meds are free Patient reports access/transportation concerns to their pharmacy: No  Patient reports adherence concerns with their medications:  No    Fill history is appropriate  Diabetes:  Current medications: metformin  IR 1000 mg BID with meals, linagliptin  (Tradjenta ) 5 mg daily (patient reports he has supply of this and is taking at home), Trulicity  3 mg weekly (Mondays), Jardiance  25 mg daily Medications  tried in the past: N/A  Current glucose readings: Did not bring glucometer with him today Using Accu Chek Guide meter; testing 2 times daily  - checking around 11:30-12PM after breakfast: usually 200s - checking in the evening after dinner: higher 200s, closer to 300s.  - Reports this is not a change   Patient denies hypoglycemic s/sx including dizziness, shakiness, sweating. Patient denies hyperglycemic symptoms including polyuria, polydipsia, polyphagia, nocturia, neuropathy (fingers and toes). Denies blurred vision.  Current meal patterns:  - Breakfast: bowl of cereal, bacon-egg sandwich,  - Supper: brown rice and chicken. Large portion of rice - Drinks: homemade sweet tea - with a little bit of sugar but most lemon, drinking 5-6x 16 oz water bottle during the day  Current physical activity: did not discuss today  Current medication access support: none- [employer] Dual Complete  Hypertension:  Current medications: lisinopril -hydrochlorothiazide  20-25 mg daily Medications previously tried:   Patient has a automated, upper arm home BP cuff - he is not currently checking at home Current blood pressure readings readings: N/A  Patient denies hypotensive s/sx including dizziness, lightheadedness.  Patient denies hypertensive symptoms including headache, chest pain, shortness of breath   Hyperlipidemia/ASCVD Risk Reduction  Current lipid lowering medications: simvastatin  10 mg daily Medications tried in the past: N/A  Antiplatelet regimen: N/A  ASCVD History: N/A Family History: heart disease in mother Risk Factors: T2DM, HTN, family history   Clinical ASCVD: No  The ASCVD Risk score (Arnett DK, et al., 2019) failed to calculate for the following reasons:   The valid total cholesterol range is 130 to 320 mg/dL  Objective:  BP Readings from Last 3 Encounters:  11/14/23 131/68  10/15/23 135/66  05/09/23 136/70    Lab Results  Component Value Date   HGBA1C 10.3 (A)  10/15/2023    Lab Results  Component Value Date   CREATININE 0.76 10/15/2023   BUN 11 10/15/2023   NA 131 (L) 10/15/2023   K 5.0 10/15/2023   CL 94 (L) 10/15/2023   CO2 21 10/15/2023    Lab Results  Component Value Date   CHOL 93 (L) 10/15/2023   HDL 49 10/15/2023   LDLCALC 22 10/15/2023   TRIG 129 10/15/2023   CHOLHDL 1.9 10/15/2023    Medications Reviewed Today     Reviewed by Adra Alanis, RPH (Pharmacist) on 11/14/23 at 1542  Med List Status: <None>   Medication Order Taking? Sig Documenting Provider Last Dose Status Informant  Blood Glucose Monitoring Suppl DEVI [id number]  1 each by Does not apply route in the morning and at bedtime. May substitute to any manufacturer covered by patient's insurance. Jerrlyn Morel, NP  Active   cyclobenzaprine  (FLEXERIL ) 10 MG tablet [id number] Yes TAKE 1 TABLET(10 MG) BY MOUTH THREE TIMES DAILY AS NEEDED FOR MUSCLE SPASMS Jerrlyn Morel, NP Taking Active   empagliflozin  (JARDIANCE ) 25 MG TABS tablet [id number] Yes Take 1 tablet (25 mg total) by mouth daily. Jerrlyn Morel, NP Taking Active   esomeprazole  (NEXIUM ) 40 MG capsule [id number] Yes TAKE 1 CAPSULE BY MOUTH EVERY MORNING BEFORE BREAKFAST Jerrlyn Morel, NP Taking Active   gabapentin  (NEURONTIN ) 300 MG capsule [id number] Yes Take 1 capsule (300 mg total) by mouth 3 (three) times daily. Jerrlyn Morel, NP Taking Active   hydrocortisone  (ANUSOL -HC) 2.5 % rectal cream [id number]  Place 1 Application rectally 2 (two) times daily. Use for 10 days Arlee Bellows, NP  Active   lisinopril -hydrochlorothiazide  (ZESTORETIC ) 20-25 MG tablet [id number] Yes TAKE 1 TABLET BY MOUTH DAILY Nichols, [name] S, NP Taking Active   metFORMIN  (GLUCOPHAGE ) 1000 MG tablet [id number] Yes TAKE 1 TABLET(1000 MG) BY MOUTH TWICE DAILY WITH A MEAL Nichols, [name] S, NP Taking Active   oxyCODONE -acetaminophen  (PERCOCET) 10-325 MG tablet [id number] Yes Take 1 tablet by mouth 4 (four) times daily as needed for pain.  [provider] Taking Active Self  simvastatin  (ZOCOR ) 10 MG tablet [id number] Yes TAKE 1 TABLET(10 MG) BY MOUTH DAILY Nichols, [name] S, NP Taking Active   TRADJENTA  5 MG TABS tablet [id number] Yes TAKE 1 TABLET(5 MG) BY MOUTH DAILY Nichols, [name] S, NP Taking Active Self  TRULICITY  3 MG/0.5ML [place] [id number] Yes INJECT 3 MG INTO THE SKIN EVERY MONDAY Jerrlyn Morel, NP Taking Active               Assessment/Plan:   Diabetes: - Currently uncontrolled with last A1C of 10.3% uncontrolled above goal < 7% and worsened from 8.1% without an identifiable cause. Patient is tolerating Trulicity  well. Reports he does not feel any appetite suppression and still has a large appetite with frequent dietary excursions. He would be a good candidate to switch to a more potent GLP-1RA or GLP-1RA/GIP RA like Ozempic or Mounjaro. Mounjaro $0 without PA per test claim in clinic today, and will be a simple transition for the patient since the pen looks the same as Trulicity . Will start with second dose level of 5 mg weekly, since he is stable on Trulicity  3 mg weekly. Instructed patient to stop Tradjenta  (linagliptin ) as this is a DPP4i and is duplicate therapy  with a GLP-1RA. He is tolerating metformin  and Jardiance  well.  - Reviewed long term cardiovascular and renal outcomes of uncontrolled blood sugar - Reviewed goal A1c, goal fasting, and goal 2 hour post prandial glucose - Reviewed dietary modifications including minimizing carbohydrate food and drinks, maximizing protein intake with meals.  - Recommend to continue metformin  IR 1000 mg BID, Jardiance  25 mg daily - Recommend to STOP Tradjenta  (linagliptin ) 5 mg daily  - Recommend to STOP Trulicity  and START Mounjaro 5 mg weekly when his next Trulicity  dose would be due next Monday. Will collaborate with PCP to place orders.  - Patient denies personal or family history of multiple endocrine neoplasia type 2, medullary thyroid  cancer; personal history  of pancreatitis or gallbladder disease. - Recommend to check glucose twice daily with glucometer. Patient reports that CGM sensor irritated his skin in the past.    Hypertension: - Currently controlled with clinic BP of 131/68 almost at goal < 130/80 mmHg, which is consistent for him. Plan to repeat BMP at next PCP appt as potassium was 5.0 mEq/L on last labs while taking lisinopril  20 mg daily. This is mildly elevated, and recheck will ensure that he is not at risk for arrhythmia or in need of a dose reduction with lisinopril .  - Reviewed long term cardiovascular and renal outcomes of uncontrolled blood pressure - Reviewed appropriate blood pressure monitoring technique and reviewed goal blood pressure. Recommended to check home blood pressure and heart rate at least once per week.  - Recommend to continue lisinopril -hydrochlorothiazide  20-25 mg daily  - Recheck BMP at next clinic appt to monitor for elevated K   Hyperlipidemia/ASCVD Risk Reduction: - Currently controlled with LDL-C of 22 mg/dL. Patient does have an indication for higher intensity statin with T2DM + comorbidities, but he is tolerating this medication well and LDL-C is very low. Therefore, will continue current regimen at this time - Reviewed long term complications of uncontrolled cholesterol - Recommend to continue simvastatin  10 mg daily   Follow Up Plan: PharmD in person 12/26/23, PCP 01/16/24  Arthea Larsson, PharmD PGY1 Pharmacy Resident

## 2023-11-14 NOTE — Patient Instructions (Addendum)
 It was great to see you today. Please see the following instructions for changes in your medications:  STOP Tradjenta  STOP Trulicity  When your next dose of Trulicity  is due next Monday, START Mounjaro 5 mg weekly.   After you complete your first box of Mounjaro, if you are tolerating it well please ask the pharmacy to fill Mounjaro 10 mg for you  Keep checking blood sugars twice daily. Please bring your glucometer and blood sugar readings to your next appointment to review.

## 2023-11-20 ENCOUNTER — Ambulatory Visit: Payer: Self-pay

## 2023-11-28 LAB — HM DIABETES EYE EXAM

## 2023-11-29 ENCOUNTER — Telehealth: Payer: Self-pay | Admitting: Nurse Practitioner

## 2023-11-29 NOTE — Telephone Encounter (Signed)
 Patient was identified as falling into the [place] Measure - Diabetes.   Patient was: Referred to pharmacy for chronic disease management.  Has upcoming appt with pharmacy, completed PCP appt on  11/14/23

## 2023-12-06 ENCOUNTER — Ambulatory Visit: Admitting: Podiatry

## 2023-12-06 ENCOUNTER — Encounter: Payer: Self-pay | Admitting: Podiatry

## 2023-12-06 DIAGNOSIS — M722 Plantar fascial fibromatosis: Secondary | ICD-10-CM

## 2023-12-06 MED ORDER — TRIAMCINOLONE ACETONIDE 10 MG/ML IJ SUSP
10.0000 mg | Freq: Once | INTRAMUSCULAR | Status: AC
  Administered 2023-12-06: 10 mg via INTRA_ARTICULAR

## 2023-12-07 NOTE — Progress Notes (Signed)
 Subjective:   Patient ID: Riley Clarke, male   DOB: 68 y.o.   MRN: [id number]   HPI Patient states his heels are hurting again and he knows this is only temporary but is what gives him relief and is not eligible for surgery   ROS      Objective:  Physical Exam  Neuro vascular status intact with inflammation pain of the plantar fascia at insertion bilateral heel     Assessment:  Acute plantar fasciitis bilateral with inflammation     Plan:  H&P done sterile prep injected the plantar fascia bilateral 3 mg Kenalog  5 mg Xylocaine  applied sterile dressing and advised on anti-inflammatories as needed and shoe gear modifications

## 2023-12-24 ENCOUNTER — Telehealth: Payer: Self-pay | Admitting: Nurse Practitioner

## 2023-12-24 NOTE — Telephone Encounter (Signed)
 Patient was identified as falling into the [place] Measure - Diabetes.   Patient was: Referred to pharmacy for chronic disease management.  Next pharmacy appt: 12/26/23  Next PCP visit: 01/16/24

## 2023-12-25 NOTE — Progress Notes (Unsigned)
 12/26/2023 Name: LORENZO ARSCOTT MRN: [id number] DOB: 1955-09-19  Chief Complaint  Patient presents with   Diabetes   Hypertension   Hyperlipidemia    EMAURI KRYGIER is a 68 y.o. year old male who was referred for medication management by their primary care provider, [name], NP. They presented for a face to face visit today.   They were referred to the pharmacist by their PCP for assistance in managing diabetes, hypertension, and hyperlipidemia . PMH includes HTN, T2DM, BMI > 30, opioid dependence for chronic pain.   Subjective: Patient was last seen by PCP, [name], on 10/15/23. BP was 135/66, HR 90. Patient reported taking his medications as prescribed. A1C had increased from 8.1% to 10.3%. Patient was last seen by pharmacy in person on 11/14/23. He could not identify a specific cause for his recent increase in A1C. He was instructed to stop linaglipitin, stop Trulicity , and start Mounjaro . He was instructed to continue metformin  and Jardiance .   Patient reports doing well today. He confirms that he started taking Mounjaro  and stopped Tradjenta . However, he reports that he stopped taking metformin  about 1 month ago. He reports that he feels better off the medication but does not list any specific symptoms he was having while taking the medication. He endorses that he has read about metformin  not being a safe medication. He also has been out of Jardiance  for several weeks. He has been intermittently checking his blood sugars and brings his glucometer in for review.   Care Team: Primary Care Provider: [name], NP ; Next Scheduled Visit: 01/16/24  Medication Access/Adherence  Current Pharmacy:  [id number] - [place], [place] - [address] AT [facility] [address] [place] [address] Phone: [phone] Fax: [phone]   Patient reports affordability concerns with their medications: No  - [employer] Dual  Complete, meds are free Patient reports access/transportation concerns to their pharmacy: No  Patient reports adherence concerns with their medications:  No    Fill history is ok - though demonstrates that he is overdue for a refill of Jardiance   Diabetes:  Current medications: metformin  IR 1000 mg BID with meals (not currently taking),  Mounjaro  5 mg weekly (Mondays), Jardiance  25 mg daily (ran out, not currently taking) Medications tried in the past: N/A  Denies GI AE since starting Mounjaro . Feels that he has lost weight. 221 lbs today, compared to 233 lbs on 11/14/23.   Current glucose readings: Has glucometer with him today Using Accu Chek Guide meter; testing 2 times daily  7/16: 3AM: 301, 8AM 275, 12PM 319, 1PM 284 7/15: 3AM 321, 4AM 462, 9PM 415 6/30: 4AM 462   Patient denies hypoglycemic s/sx including dizziness, shakiness, sweating. Patient denies hyperglycemic symptoms including polyuria, polydipsia, polyphagia, nocturia, neuropathy, denies blurred vision.  Current meal patterns:  - Breakfast: bowl of cereal, bacon-egg sandwich,  - Supper: brown rice and chicken. Large portion of rice - Drinks: homemade sweet tea - with a little bit of sugar but most lemon, drinking 5-6x 16 oz water bottle during the day. Drinking water and regular Gatorade.   Current physical activity: did not discuss today  Current medication access support: none- [employer] Dual Complete  Hypertension:  Current medications: lisinopril -hydrochlorothiazide  20-25 mg daily Medications previously tried:   Patient has a automated, upper arm home BP cuff - checked at home 2 days ago: 125/63 mmHg Current blood pressure readings readings: N/A  Patient denies hypotensive  s/sx including dizziness, lightheadedness.  Patient denies hypertensive symptoms including headache, chest pain, shortness of breath   Hyperlipidemia/ASCVD Risk Reduction  Current lipid lowering medications: simvastatin  10 mg daily Medications  tried in the past: N/A  Antiplatelet regimen: N/A  ASCVD History: N/A Family History: heart disease in mother Risk Factors: T2DM, HTN, family history   Clinical ASCVD: No  The ASCVD Risk score (Arnett DK, et al., 2019) failed to calculate for the following reasons:   The valid total cholesterol range is 130 to 320 mg/dL    Objective:  BP Readings from Last 3 Encounters:  12/26/23 130/78  11/14/23 131/68  10/15/23 135/66    Lab Results  Component Value Date   HGBA1C 10.3 (A) 10/15/2023    Lab Results  Component Value Date   CREATININE 0.76 10/15/2023   BUN 11 10/15/2023   NA 131 (L) 10/15/2023   K 5.0 10/15/2023   CL 94 (L) 10/15/2023   CO2 21 10/15/2023    Lab Results  Component Value Date   CHOL 93 (L) 10/15/2023   HDL 49 10/15/2023   LDLCALC 22 10/15/2023   TRIG 129 10/15/2023   CHOLHDL 1.9 10/15/2023    Medications Reviewed Today     Reviewed by Brinda Lorain SQUIBB, RPH (Pharmacist) on 12/26/23 at 1438  Med List Status: <None>   Medication Order Taking? Sig Documenting Provider Last Dose Status Informant  Blood Glucose Monitoring Suppl DEVI [id number]  1 each by Does not apply route in the morning and at bedtime. May substitute to any manufacturer covered by patient's insurance. [name], NP  Active   cyclobenzaprine  (FLEXERIL ) 10 MG tablet [id number] Yes TAKE 1 TABLET(10 MG) BY MOUTH THREE TIMES DAILY AS NEEDED FOR MUSCLE SPASMS Nichols, [name] S, NP  Active   empagliflozin  (JARDIANCE ) 25 MG TABS tablet [id number]  Take 1 tablet (25 mg total) by mouth daily.  Patient not taking: Reported on 12/26/2023   [name], NP  Active   esomeprazole  (NEXIUM ) 40 MG capsule [id number] Yes TAKE 1 CAPSULE BY MOUTH EVERY MORNING BEFORE BREAKFAST [name], NP  Active   gabapentin  (NEURONTIN ) 300 MG capsule [id number] Yes Take 1 capsule (300 mg total) by mouth 3 (three) times daily. [name], NP  Active   hydrocortisone  (ANUSOL -HC) 2.5 % rectal cream  [id number]  Place 1 Application rectally 2 (two) times daily. Use for 10 days Kerman Vina HERO, NP  Active   lisinopril -hydrochlorothiazide  (ZESTORETIC ) 20-25 MG tablet [id number] Yes TAKE 1 TABLET BY MOUTH DAILY Nichols, [name] S, NP  Active   metFORMIN  (GLUCOPHAGE ) 1000 MG tablet [id number]  TAKE 1 TABLET(1000 MG) BY MOUTH TWICE DAILY WITH A MEAL  Patient not taking: Reported on 12/26/2023   [name], NP  Active   oxyCODONE -acetaminophen  (PERCOCET) 10-325 MG tablet [id number] Yes Take 1 tablet by mouth 4 (four) times daily as needed for pain. [provider]  Active Self  simvastatin  (ZOCOR ) 10 MG tablet [id number] Yes TAKE 1 TABLET(10 MG) BY MOUTH DAILY Nichols, [name] S, NP  Active   tirzepatide  (MOUNJARO ) 5 MG/0.5ML Pen [id number] Yes Inject 5 mg into the skin once a week. [name], NP  Active   tirzepatide  (MOUNJARO ) 7.5 MG/0.5ML Pen [id number]  Inject 7.5 mg into the skin once a week.  Patient not taking: Reported on 12/26/2023   [name], NP  Active               Assessment/Plan:   Diabetes: -  Currently uncontrolled with last A1C of 10.3% uncontrolled above goal < 7% and worsened from 8.1% without an identifiable cause. Patient is tolerating Mounjaro  well, though it is difficult to assess efficacy since he wast not taking metformin  or Jardiance  over the past two days that he was monitoring his BG. He is amenable to increasing Mounjaro  today, and restarting his other maintenance medications. Discussed that if his BG does not improve on these 3 antihyperglycemics, he will likely require insulin .  - Reviewed long term cardiovascular and renal outcomes of uncontrolled blood sugar - Reviewed goal A1c, goal fasting, and goal 2 hour post prandial glucose - Reviewed dietary modifications including minimizing carbohydrate food and drinks, maximizing protein intake with meals.  - Recommend to RESTART metformin  IR 1000 mg BID, Jardiance  25 mg daily. Patient requested  refills from [facility].  - Recommend to INCREASE Mounjaro  to 7.5 mg weekly. Rx was previously sent to [facility] for titration. - Patient denies personal or family history of multiple endocrine neoplasia type 2, medullary thyroid  cancer; personal history of pancreatitis or gallbladder disease. - Recommend to check glucose twice daily with glucometer and bring meter to all appointments for review.   Hypertension: - Currently controlled with clinic BP of 130/78 almost at goal < 130/80 mmHg, which is consistent for him. Plan to repeat BMP at next PCP appt as potassium was 5.0 mEq/L on last labs while taking lisinopril  20 mg daily. This is mildly elevated, and recheck will ensure that he is not at risk for arrhythmia or in need of a dose reduction with lisinopril .  - Reviewed long term cardiovascular and renal outcomes of uncontrolled blood pressure - Reviewed appropriate blood pressure monitoring technique and reviewed goal blood pressure. Recommended to check home blood pressure and heart rate at least once per week.  - Recommend to continue lisinopril -hydrochlorothiazide  20-25 mg daily  - Recheck BMP at next PCP clinic appt to monitor for elevated K   Hyperlipidemia/ASCVD Risk Reduction: - Currently controlled with LDL-C of 22 mg/dL. Patient does have an indication for higher intensity statin with T2DM + comorbidities, but he is tolerating this medication well and LDL-C is very low. Therefore, will continue current regimen at this time - Reviewed long term complications of uncontrolled cholesterol - Recommend to continue simvastatin  10 mg daily   Follow Up Plan:  PCP 01/16/24, PharmD in person 02/26/24  Lorain Baseman, PharmD PGY1 Pharmacy Resident

## 2023-12-26 ENCOUNTER — Ambulatory Visit (INDEPENDENT_AMBULATORY_CARE_PROVIDER_SITE_OTHER): Payer: Self-pay

## 2023-12-26 VITALS — BP 130/78 | Wt 221.0 lb

## 2023-12-26 DIAGNOSIS — E119 Type 2 diabetes mellitus without complications: Secondary | ICD-10-CM

## 2023-12-26 NOTE — Patient Instructions (Addendum)
 It was good to see you today!  For your blood sugars:  Increase Mounjaro  to 7.5 mg weekly Restart metformin  1000 mg BID Restart Jardiance  25 mg daily  Keep checking your blood sugars twice daily. If they continue to be elevated at your next appointment once you have restarted your medications, we may need to start insulin   Your goal fasting blood sugar goal is 80-130 mg/dL, your goal blood sugar 2 hours after eating is less than 180 mg/dL.   For your cholesterol Continue simvastatin  10 mg daily  For your blood pressure Continue lisinopril -hydrochlorothiazide  20-25 mg daily

## 2023-12-27 ENCOUNTER — Ambulatory Visit: Payer: Self-pay

## 2023-12-27 VITALS — Ht 72.0 in | Wt 221.0 lb

## 2023-12-27 DIAGNOSIS — Z0000 Encounter for general adult medical examination without abnormal findings: Secondary | ICD-10-CM

## 2023-12-27 NOTE — Progress Notes (Signed)
 Subjective:   Riley Clarke is a 68 y.o. male who presents for Medicare Annual/Subsequent preventive examination.  Visit Complete: Virtual I connected with  Riley Clarke on 12/27/23 by a audio enabled telemedicine application and verified that I am speaking with the correct person using two identifiers.  Patient Location: Home  Provider Location: Office/Clinic  I discussed the limitations of evaluation and management by telemedicine. The patient expressed understanding and agreed to proceed.  Vital Signs: Because this visit was a virtual/telehealth visit, some criteria may be missing or patient reported. Any vitals not documented were not able to be obtained and vitals that have been documented are patient reported.  Patient Medicare AWV questionnaire was completed by the patient on 12/27/23; I have confirmed that all information answered by patient is correct and no changes since this date.  Cardiac Risk Factors include: advanced age (>77men, >9 women);diabetes mellitus;hypertension;male gender     Objective:    Today's Vitals   12/27/23 1046  Weight: 221 lb (100.2 kg)  Height: 6' (1.829 m)   Body mass index is 29.97 kg/m.     12/27/2023   11:16 AM 08/31/2022   12:58 PM 03/02/2021    1:15 PM 02/24/2021    3:58 PM 10/13/2019    2:12 PM 09/15/2019    2:21 PM 06/19/2019    6:25 AM  Advanced Directives  Does Patient Have a Medical Advance Directive? No No  No No No No  Would patient like information on creating a medical advance directive? No - Patient declined No - Patient declined No - Patient declined Yes (ED - Information included in AVS)  No - Patient declined No - Patient declined    Current Medications (verified) Outpatient Encounter Medications as of 12/27/2023  Medication Sig   Blood Glucose Monitoring Suppl DEVI 1 each by Does not apply route in the morning and at bedtime. May substitute to any manufacturer covered by patient's insurance.   cyclobenzaprine   (FLEXERIL ) 10 MG tablet TAKE 1 TABLET(10 MG) BY MOUTH THREE TIMES DAILY AS NEEDED FOR MUSCLE SPASMS   esomeprazole  (NEXIUM ) 40 MG capsule TAKE 1 CAPSULE BY MOUTH EVERY MORNING BEFORE BREAKFAST   gabapentin  (NEURONTIN ) 300 MG capsule Take 1 capsule (300 mg total) by mouth 3 (three) times daily.   hydrocortisone  (ANUSOL -HC) 2.5 % rectal cream Place 1 Application rectally 2 (two) times daily. Use for 10 days   lisinopril -hydrochlorothiazide  (ZESTORETIC ) 20-25 MG tablet TAKE 1 TABLET BY MOUTH DAILY   oxyCODONE -acetaminophen  (PERCOCET) 10-325 MG tablet Take 1 tablet by mouth 4 (four) times daily as needed for pain.   simvastatin  (ZOCOR ) 10 MG tablet TAKE 1 TABLET(10 MG) BY MOUTH DAILY   empagliflozin  (JARDIANCE ) 25 MG TABS tablet Take 1 tablet (25 mg total) by mouth daily. (Patient not taking: Reported on 12/27/2023)   metFORMIN  (GLUCOPHAGE ) 1000 MG tablet TAKE 1 TABLET(1000 MG) BY MOUTH TWICE DAILY WITH A MEAL (Patient not taking: Reported on 12/27/2023)   tirzepatide  (MOUNJARO ) 7.5 MG/0.5ML Pen Inject 7.5 mg into the skin once a week. (Patient not taking: Reported on 12/27/2023)   No facility-administered encounter medications on file as of 12/27/2023.    Allergies (verified) Cymbalta [duloxetine hcl], Shrimp [shellfish allergy], and Valium  [diazepam ]   History: Past Medical History:  Diagnosis Date   Arthritis    Chronic back pain    on chronic opioids   Diabetes mellitus without complication (HCC)    GERD (gastroesophageal reflux disease)    HLD (hyperlipidemia)    Hypertension  no meds   Vitamin D  deficiency    Past Surgical History:  Procedure Laterality Date   ANTERIOR CERVICAL DECOMP/DISCECTOMY FUSION N/A 09/05/2022   Procedure: Anterior Cervical Decompression Fusion  - Cervical three-Cervical four;  Surgeon: Louis Shove, MD;  Location: [facility] OR;  Service: Neurosurgery;  Laterality: N/A;   CERVICAL DISC SURGERY  99   LUMBAR FUSION     LUMBAR LAMINECTOMY/DECOMPRESSION MICRODISCECTOMY  Bilateral 03/09/2021   Procedure: Laminectomy and Foraminotomy - bilateral - Lumbar two-Lumbar three;  Surgeon: Louis Shove, MD;  Location: [facility] OR;  Service: Neurosurgery;  Laterality: Bilateral;   ROTATOR CUFF REPAIR Bilateral    SHOULDER ARTHROSCOPY WITH ROTATOR CUFF REPAIR AND SUBACROMIAL DECOMPRESSION Right 12/30/2018   Procedure: SHOULDER ARTHROSCOPY WITH ROTATOR CUFF REPAIR AND SUBACROMIAL DECOMPRESSION, DISTAL CLAVICLE EXICISION,;  Surgeon: Cristy Bonner DASEN, MD;  Location: [facility];  Service: Orthopedics;  Laterality: Right;   SHOULDER ARTHROSCOPY WITH SUBACROMIAL DECOMPRESSION, ROTATOR CUFF REPAIR AND BICEP TENDON REPAIR Left 06/19/2019   Procedure: LEFT SHOULDER ARTHROSCOPY, EXTENTSIVE DEBRIDEMENT, DISTAL CLAVICULECTOMY, SUBACROMIAL DECOMPRESSION, PARTIAL ACROMIOPLASTY WITH CORACOACROMIAL RELEASE, ROTATOR CUFFF REPAIR AND BICEP TENODESIS;  Surgeon: Cristy Bonner DASEN, MD;  Location: [facility];  Service: Orthopedics;  Laterality: Left;   Family History  Problem Relation Age of Onset   Heart disease Mother    Diabetes Mother    Amblyopia Neg Hx    Blindness Neg Hx    Cataracts Neg Hx    Hypertension Neg Hx    Macular degeneration Neg Hx    Retinal detachment Neg Hx    Strabismus Neg Hx    Esophageal cancer Neg Hx    Colon cancer Neg Hx    Pancreatic cancer Neg Hx    Ulcerative colitis Neg Hx    Inflammatory bowel disease Neg Hx    Liver disease Neg Hx    Rectal cancer Neg Hx    Stomach cancer Neg Hx    Social History   Socioeconomic History   Marital status: Single    Spouse name: Not on file   Number of children: 3   Years of education: Not on file   Highest education level: 12th grade  Occupational History   Occupation: disability  Tobacco Use   Smoking status: Former    Current packs/day: 0.00    Average packs/day: 0.3 packs/day for 25.0 years (6.3 ttl pk-yrs)    Types: Cigarettes    Start date: 04/27/1995    Quit date: 04/26/2020    Years  since quitting: 3.6   Smokeless tobacco: Never  Vaping Use   Vaping status: Never Used  Substance and Sexual Activity   Alcohol use: Yes    Alcohol/week: 4.0 standard drinks of alcohol    Types: 4 Cans of beer per week    Comment: twice a week   Drug use: No   Sexual activity: Not Currently  Other Topics Concern   Not on file  Social History Narrative   Right handed   Lives alone in a one story home   Social Drivers of Health   Financial Resource Strain: Low Risk  (12/25/2023)   Overall Financial Resource Strain (CARDIA)    Difficulty of Paying Living Expenses: Not hard at all  Food Insecurity: Patient Declined (12/25/2023)   Hunger Vital Sign    Worried About Running Out of Food in the Last Year: Patient declined    Ran Out of Food in the Last Year: Patient declined  Transportation Needs: No Transportation Needs (12/25/2023)  PRAPARE - Administrator, Civil Service (Medical): No    Lack of Transportation (Non-Medical): No  Physical Activity: Inactive (12/25/2023)   Exercise Vital Sign    Days of Exercise per Week: 0 days    Minutes of Exercise per Session: Not on file  Stress: No Stress Concern Present (12/25/2023)   [employer] of Occupational Health - Occupational Stress Questionnaire    Feeling of Stress: Not at all  Social Connections: Unknown (12/25/2023)   Social Connection and Isolation Panel    Frequency of Communication with Friends and Family: Once a week    Frequency of Social Gatherings with Friends and Family: Patient declined    Attends Religious Services: Never    Database administrator or Organizations: No    Attends Engineer, structural: Not on file    Marital Status: Never married    Tobacco Counseling Counseling given: Not Answered   Clinical Intake:  Pre-visit preparation completed: No  Pain : No/denies pain     BMI - recorded: 29.97 Nutritional Status: BMI > 30  Obese Nutritional Risks: None Diabetes: Yes CBG  done?: No Did pt. bring in CBG monitor from home?: No            Activities of Daily Living    12/27/2023   11:17 AM  In your present state of health, do you have any difficulty performing the following activities:  Hearing? 0  Vision? 0  Difficulty concentrating or making decisions? 0  Walking or climbing stairs? 0  Dressing or bathing? 0  Doing errands, shopping? 0  Preparing Food and eating ? N  Using the Toilet? N  In the past six months, have you accidently leaked urine? N  Do you have problems with loss of bowel control? N  Managing your Medications? N  Managing your Finances? N  Housekeeping or managing your Housekeeping? N    Patient Care Team: [name], NP as PCP - General (Pulmonary Disease) Lavona Agent, MD as PCP - Cardiology (Cardiology) Brinda Lorain SQUIBB, [facility] (Pharmacist)  Indicate any recent Medical Services you may have received from other than [facility] providers in the past year (date may be approximate).     Assessment:   This is a routine wellness examination for Cyle.  Hearing/Vision screen No results found.   Goals Addressed             This Visit's Progress    DIET - INCREASE WATER INTAKE         Depression Screen    12/27/2023   11:17 AM 10/15/2023    2:29 PM 10/11/2022    1:23 PM 06/28/2022    2:19 PM 10/12/2021    2:31 PM 07/08/2021    1:52 PM 04/07/2021    1:13 PM  PHQ 2/9 Scores  PHQ - 2 Score 0 0 0 0 0 0 0  PHQ- 9 Score  0  0       Fall Risk    12/27/2023   11:16 AM 01/12/2023    2:27 PM 10/11/2022    1:22 PM 08/10/2022    7:16 AM 10/12/2021    2:30 PM  Fall Risk   Falls in the past year? 0 1 1 0 0  Number falls in past yr: 0 0 0 0 0  Injury with Fall? 0 0 0 0 0  Risk for fall due to : No Fall Risks No Fall Risks No Fall Risks  No Fall Risks  Follow up Falls evaluation completed  Follow up appointment  Falls evaluation completed      Data saved with a previous flowsheet row definition    MEDICARE RISK AT  HOME: Medicare Risk at Home Any stairs in or around the home?: No If so, are there any without handrails?: No Home free of loose throw rugs in walkways, pet beds, electrical cords, etc?: Yes Adequate lighting in your home to reduce risk of falls?: Yes Life alert?: No Use of a cane, walker or w/c?: No Grab bars in the bathroom?: No Shower chair or bench in shower?: No Elevated toilet seat or a handicapped toilet?: No  TIMED UP AND GO:  Was the test performed?  No    Cognitive Function:        02/24/2021    4:02 PM  6CIT Screen  What Year? 0 points  What month? 0 points  What time? 0 points  Count back from 20 0 points  Months in reverse 0 points  Repeat phrase 0 points  Total Score 0 points    Immunizations Immunization History  Administered Date(s) Administered   Fluad Quad(high Dose 65+) 04/16/2023   Influenza,inj,Quad PF,6+ Mos 04/01/2015, 02/04/2016, 02/19/2017, 04/02/2018, 02/02/2021   Moderna SARS-COV2 Booster Vaccination 05/04/2020   Moderna Sars-Covid-2 Vaccination 08/27/2019, 09/24/2019   Pneumococcal Conjugate-13 04/01/2015, 10/11/2022   Pneumococcal Polysaccharide-23 05/08/2016   Tdap 09/06/2014, 04/01/2015    TDAP status: Up to date  Flu Vaccine status: Up to date  Pneumococcal vaccine status: Up to date  Covid-19 vaccine status: Completed vaccines  Qualifies for Shingles Vaccine? Yes   Zostavax completed No   Shingrix Completed?: No.    Education has been provided regarding the importance of this vaccine. Patient has been advised to call insurance company to determine out of pocket expense if they have not yet received this vaccine. Advised may also receive vaccine at local pharmacy or Health Dept. Verbalized acceptance and understanding.  Screening Tests Health Maintenance  Topic Date Due   Zoster Vaccines- Shingrix (1 of 2) Never done   COVID-19 Vaccine (3 - Moderna risk series) 06/01/2020   Fecal DNA (Cologuard)  09/01/2022   FOOT EXAM   10/11/2023   INFLUENZA VACCINE  01/11/2024   HEMOGLOBIN A1C  04/16/2024   Diabetic kidney evaluation - eGFR measurement  10/14/2024   Diabetic kidney evaluation - Urine ACR  10/14/2024   OPHTHALMOLOGY EXAM  11/27/2024   Medicare Annual Wellness (AWV)  12/26/2024   DTaP/Tdap/Td (3 - Td or Tdap) 03/31/2025   Pneumococcal Vaccine: 50+ Years (3 of 3 - PCV20 or PCV21) 10/11/2027   Colonoscopy  03/27/2033   Hepatitis C Screening  Completed   Hepatitis B Vaccines  Aged Out   HPV VACCINES  Aged Out   Meningococcal B Vaccine  Aged Out    Health Maintenance  Health Maintenance Due  Topic Date Due   Zoster Vaccines- Shingrix (1 of 2) Never done   COVID-19 Vaccine (3 - Moderna risk series) 06/01/2020   Fecal DNA (Cologuard)  09/01/2022   FOOT EXAM  10/11/2023    Colorectal cancer screening: Type of screening: Colonoscopy. Completed 03/28/2023. Repeat every 10 years  Lung Cancer Screening: (Low Dose CT Chest recommended if Age 60-80 years, 20 pack-year currently smoking OR have quit w/in 15years.) does not qualify.   Lung Cancer Screening Referral: N/A  Additional Screening:  Hepatitis C Screening: does not qualify; Completed 07/05/15  Vision Screening: Recommended annual ophthalmology exams for early detection of glaucoma and other  disorders of the eye. Is the patient up to date with their annual eye exam?  Yes  Who is the provider or what is the name of the office in which the patient attends annual eye exams? N/A If pt is not established with a provider, would they like to be referred to a provider to establish care? No .   Dental Screening: Recommended annual dental exams for proper oral hygiene  Diabetic Foot Exam: Diabetic Foot Exam: Overdue, Pt has been advised about the importance in completing this exam. Pt is scheduled for diabetic foot exam on 01/16/2024.  Community Resource Referral / Chronic Care Management: CRR required this visit?  No   CCM required this visit?   No     Plan:     I have personally reviewed and noted the following in the patient's chart:   Medical and social history Use of alcohol, tobacco or illicit drugs  Current medications and supplements including opioid prescriptions. Patient is not currently taking opioid prescriptions. Functional ability and status Nutritional status Physical activity Advanced directives List of other physicians Hospitalizations, surgeries, and ER visits in previous 12 months Vitals Screenings to include cognitive, depression, and falls Referrals and appointments  In addition, I have reviewed and discussed with patient certain preventive protocols, quality metrics, and best practice recommendations. A written personalized care plan for preventive services as well as general preventive health recommendations were provided to patient.     Suzen Shove, RMA   12/27/2023   After Visit Summary: (MyChart) Due to this being a telephonic visit, the after visit summary with patients personalized plan was offered to patient via MyChart   Nurse Notes: thank you for your time.

## 2024-01-01 ENCOUNTER — Inpatient Hospital Stay (HOSPITAL_COMMUNITY)

## 2024-01-01 ENCOUNTER — Emergency Department (HOSPITAL_COMMUNITY)

## 2024-01-01 ENCOUNTER — Inpatient Hospital Stay (HOSPITAL_COMMUNITY)
Admission: EM | Admit: 2024-01-01 | Discharge: 2024-01-10 | DRG: 871 | Disposition: A | Discharge status: Home Health | Attending: Internal Medicine | Admitting: Internal Medicine

## 2024-01-01 DIAGNOSIS — R6521 Severe sepsis with septic shock: Secondary | ICD-10-CM | POA: Diagnosis present

## 2024-01-01 DIAGNOSIS — E1110 Type 2 diabetes mellitus with ketoacidosis without coma: Secondary | ICD-10-CM | POA: Diagnosis present

## 2024-01-01 DIAGNOSIS — Z79891 Long term (current) use of opiate analgesic: Secondary | ICD-10-CM

## 2024-01-01 DIAGNOSIS — Z794 Long term (current) use of insulin: Secondary | ICD-10-CM

## 2024-01-01 DIAGNOSIS — A419 Sepsis, unspecified organism: Secondary | ICD-10-CM | POA: Diagnosis present

## 2024-01-01 DIAGNOSIS — R578 Other shock: Secondary | ICD-10-CM | POA: Diagnosis present

## 2024-01-01 DIAGNOSIS — N179 Acute kidney failure, unspecified: Secondary | ICD-10-CM

## 2024-01-01 DIAGNOSIS — E876 Hypokalemia: Secondary | ICD-10-CM | POA: Diagnosis present

## 2024-01-01 DIAGNOSIS — E860 Dehydration: Secondary | ICD-10-CM | POA: Diagnosis present

## 2024-01-01 DIAGNOSIS — A020 Salmonella enteritis: Secondary | ICD-10-CM | POA: Diagnosis present

## 2024-01-01 DIAGNOSIS — M549 Dorsalgia, unspecified: Secondary | ICD-10-CM | POA: Diagnosis present

## 2024-01-01 DIAGNOSIS — E1010 Type 1 diabetes mellitus with ketoacidosis without coma: Secondary | ICD-10-CM | POA: Diagnosis not present

## 2024-01-01 DIAGNOSIS — E785 Hyperlipidemia, unspecified: Secondary | ICD-10-CM | POA: Diagnosis present

## 2024-01-01 DIAGNOSIS — A041 Enterotoxigenic Escherichia coli infection: Secondary | ICD-10-CM | POA: Diagnosis present

## 2024-01-01 DIAGNOSIS — Z833 Family history of diabetes mellitus: Secondary | ICD-10-CM

## 2024-01-01 DIAGNOSIS — G8929 Other chronic pain: Secondary | ICD-10-CM | POA: Diagnosis present

## 2024-01-01 DIAGNOSIS — Z1152 Encounter for screening for COVID-19: Secondary | ICD-10-CM | POA: Diagnosis not present

## 2024-01-01 DIAGNOSIS — E0810 Diabetes mellitus due to underlying condition with ketoacidosis without coma: Secondary | ICD-10-CM | POA: Diagnosis not present

## 2024-01-01 DIAGNOSIS — T383X5A Adverse effect of insulin and oral hypoglycemic [antidiabetic] drugs, initial encounter: Secondary | ICD-10-CM | POA: Diagnosis present

## 2024-01-01 DIAGNOSIS — E861 Hypovolemia: Secondary | ICD-10-CM | POA: Diagnosis present

## 2024-01-01 DIAGNOSIS — Z87891 Personal history of nicotine dependence: Secondary | ICD-10-CM

## 2024-01-01 DIAGNOSIS — K219 Gastro-esophageal reflux disease without esophagitis: Secondary | ICD-10-CM

## 2024-01-01 DIAGNOSIS — Z7985 Long-term (current) use of injectable non-insulin antidiabetic drugs: Secondary | ICD-10-CM

## 2024-01-01 DIAGNOSIS — I10 Essential (primary) hypertension: Secondary | ICD-10-CM | POA: Diagnosis present

## 2024-01-01 DIAGNOSIS — Z79899 Other long term (current) drug therapy: Secondary | ICD-10-CM

## 2024-01-01 DIAGNOSIS — Z91013 Allergy to seafood: Secondary | ICD-10-CM

## 2024-01-01 DIAGNOSIS — M1990 Unspecified osteoarthritis, unspecified site: Secondary | ICD-10-CM | POA: Diagnosis present

## 2024-01-01 DIAGNOSIS — A040 Enteropathogenic Escherichia coli infection: Secondary | ICD-10-CM | POA: Diagnosis present

## 2024-01-01 DIAGNOSIS — E871 Hypo-osmolality and hyponatremia: Secondary | ICD-10-CM | POA: Diagnosis present

## 2024-01-01 DIAGNOSIS — N170 Acute kidney failure with tubular necrosis: Secondary | ICD-10-CM | POA: Diagnosis present

## 2024-01-01 DIAGNOSIS — Z888 Allergy status to other drugs, medicaments and biological substances status: Secondary | ICD-10-CM

## 2024-01-01 DIAGNOSIS — E1310 Other specified diabetes mellitus with ketoacidosis without coma: Secondary | ICD-10-CM | POA: Diagnosis not present

## 2024-01-01 DIAGNOSIS — Z8249 Family history of ischemic heart disease and other diseases of the circulatory system: Secondary | ICD-10-CM

## 2024-01-01 DIAGNOSIS — I4891 Unspecified atrial fibrillation: Secondary | ICD-10-CM | POA: Diagnosis present

## 2024-01-01 DIAGNOSIS — Z7984 Long term (current) use of oral hypoglycemic drugs: Secondary | ICD-10-CM

## 2024-01-01 DIAGNOSIS — Z981 Arthrodesis status: Secondary | ICD-10-CM

## 2024-01-01 LAB — I-STAT CHEM 8, ED
BUN: 93 mg/dL — ABNORMAL HIGH (ref 8–23)
Calcium, Ion: 0.88 mmol/L — CL (ref 1.15–1.40)
Chloride: 91 mmol/L — ABNORMAL LOW (ref 98–111)
Creatinine, Ser: 12.2 mg/dL — ABNORMAL HIGH (ref 0.61–1.24)
Glucose, Bld: 395 mg/dL — ABNORMAL HIGH (ref 70–99)
HCT: 42 % (ref 39.0–52.0)
Hemoglobin: 14.3 g/dL (ref 13.0–17.0)
Potassium: 4.7 mmol/L (ref 3.5–5.1)
Sodium: 121 mmol/L — ABNORMAL LOW (ref 135–145)
TCO2: 7 mmol/L — ABNORMAL LOW (ref 22–32)

## 2024-01-01 LAB — I-STAT VENOUS BLOOD GAS, ED
Acid-base deficit: 26 mmol/L — ABNORMAL HIGH (ref 0.0–2.0)
Bicarbonate: 4.2 mmol/L — ABNORMAL LOW (ref 20.0–28.0)
Calcium, Ion: 0.88 mmol/L — CL (ref 1.15–1.40)
HCT: 39 % (ref 39.0–52.0)
Hemoglobin: 13.3 g/dL (ref 13.0–17.0)
O2 Saturation: 71 %
Potassium: 4.6 mmol/L (ref 3.5–5.1)
Sodium: 121 mmol/L — ABNORMAL LOW (ref 135–145)
TCO2: <5 mmol/L — ABNORMAL LOW (ref 22–32)
pCO2, Ven: 18.7 mmHg — CL (ref 44–60)
pH, Ven: 6.963 — CL (ref 7.25–7.43)
pO2, Ven: 57 mmHg — ABNORMAL HIGH (ref 32–45)

## 2024-01-01 LAB — COMPREHENSIVE METABOLIC PANEL WITH GFR
ALT: 21 U/L (ref 0–44)
ALT: 23 U/L (ref 0–44)
AST: 34 U/L (ref 15–41)
AST: 39 U/L (ref 15–41)
Albumin: 2.5 g/dL — ABNORMAL LOW (ref 3.5–5.0)
Albumin: 2.4 g/dL — ABNORMAL LOW (ref 3.5–5.0)
Alkaline Phosphatase: 117 U/L (ref 38–126)
Alkaline Phosphatase: 129 U/L — ABNORMAL HIGH (ref 38–126)
BUN: 85 mg/dL — ABNORMAL HIGH (ref 8–23)
BUN: 84 mg/dL — ABNORMAL HIGH (ref 8–23)
CO2: <7 mmol/L — ABNORMAL LOW (ref 22–32)
CO2: <7 mmol/L — ABNORMAL LOW (ref 22–32)
Calcium: 7.6 mg/dL — ABNORMAL LOW (ref 8.9–10.3)
Calcium: 7.7 mg/dL — ABNORMAL LOW (ref 8.9–10.3)
Chloride: 83 mmol/L — ABNORMAL LOW (ref 98–111)
Chloride: 91 mmol/L — ABNORMAL LOW (ref 98–111)
Creatinine, Ser: 12.21 mg/dL — ABNORMAL HIGH (ref 0.61–1.24)
Creatinine, Ser: 11.26 mg/dL — ABNORMAL HIGH (ref 0.61–1.24)
GFR, Estimated: 4 mL/min — ABNORMAL LOW (ref >60)
GFR, Estimated: 5 mL/min — ABNORMAL LOW (ref >60)
Glucose, Bld: 393 mg/dL — ABNORMAL HIGH (ref 70–99)
Glucose, Bld: 222 mg/dL — ABNORMAL HIGH (ref 70–99)
Potassium: 5 mmol/L (ref 3.5–5.1)
Potassium: 4 mmol/L (ref 3.5–5.1)
Sodium: 127 mmol/L — ABNORMAL LOW (ref 135–145)
Sodium: 131 mmol/L — ABNORMAL LOW (ref 135–145)
Total Bilirubin: 0.5 mg/dL (ref 0.0–1.2)
Total Bilirubin: 0.7 mg/dL (ref 0.0–1.2)
Total Protein: 6.4 g/dL — ABNORMAL LOW (ref 6.5–8.1)
Total Protein: 6.1 g/dL — ABNORMAL LOW (ref 6.5–8.1)

## 2024-01-01 LAB — BLOOD GAS, ARTERIAL
Acid-base deficit: 27.8 mmol/L — ABNORMAL HIGH (ref 0.0–2.0)
Bicarbonate: 2.1 mmol/L — ABNORMAL LOW (ref 20.0–28.0)
Drawn by: 51155
O2 Saturation: 100 %
Patient temperature: 37
pCO2 arterial: <18 mmHg — CL (ref 32–48)
pH, Arterial: 6.97 — CL (ref 7.35–7.45)
pO2, Arterial: 190 mmHg — ABNORMAL HIGH (ref 83–108)

## 2024-01-01 LAB — CBC WITH DIFFERENTIAL/PLATELET
Abs Immature Granulocytes: 0 K/uL (ref 0.00–0.07)
Basophils Absolute: 0 K/uL (ref 0.0–0.1)
Basophils Relative: 0 %
Eosinophils Absolute: 0 K/uL (ref 0.0–0.5)
Eosinophils Relative: 0 %
HCT: 39.7 % (ref 39.0–52.0)
Hemoglobin: 12.7 g/dL — ABNORMAL LOW (ref 13.0–17.0)
Lymphocytes Relative: 9 %
Lymphs Abs: 1.5 K/uL (ref 0.7–4.0)
MCH: 24.6 pg — ABNORMAL LOW (ref 26.0–34.0)
MCHC: 32 g/dL (ref 30.0–36.0)
MCV: 76.8 fL — ABNORMAL LOW (ref 80.0–100.0)
Monocytes Absolute: 2.5 K/uL — ABNORMAL HIGH (ref 0.1–1.0)
Monocytes Relative: 15 %
Neutro Abs: 12.5 K/uL — ABNORMAL HIGH (ref 1.7–7.7)
Neutrophils Relative %: 76 %
Platelets: 406 K/uL — ABNORMAL HIGH (ref 150–400)
RBC: 5.17 MIL/uL (ref 4.22–5.81)
RDW: 15.9 % — ABNORMAL HIGH (ref 11.5–15.5)
WBC: 16.4 K/uL — ABNORMAL HIGH (ref 4.0–10.5)
nRBC: 0 % (ref 0.0–0.2)
nRBC: 0 /100{WBCs}

## 2024-01-01 LAB — CBC
HCT: 40.1 % (ref 39.0–52.0)
Hemoglobin: 12.5 g/dL — ABNORMAL LOW (ref 13.0–17.0)
MCH: 24.4 pg — ABNORMAL LOW (ref 26.0–34.0)
MCHC: 31.2 g/dL (ref 30.0–36.0)
MCV: 78.2 fL — ABNORMAL LOW (ref 80.0–100.0)
Platelets: 381 K/uL (ref 150–400)
RBC: 5.13 MIL/uL (ref 4.22–5.81)
RDW: 15.8 % — ABNORMAL HIGH (ref 11.5–15.5)
WBC: 13.8 K/uL — ABNORMAL HIGH (ref 4.0–10.5)
nRBC: 0.1 % (ref 0.0–0.2)

## 2024-01-01 LAB — RESP PANEL BY RT-PCR (RSV, FLU A&B, COVID)  RVPGX2
Influenza A by PCR: NEGATIVE
Influenza B by PCR: NEGATIVE
Resp Syncytial Virus by PCR: NEGATIVE
SARS Coronavirus 2 by RT PCR: NEGATIVE

## 2024-01-01 LAB — I-STAT CG4 LACTIC ACID, ED: Lactic Acid, Venous: 10.7 mmol/L (ref 0.5–1.9)

## 2024-01-01 LAB — GLUCOSE, CAPILLARY
Glucose-Capillary: 184 mg/dL — ABNORMAL HIGH (ref 70–99)
Glucose-Capillary: 191 mg/dL — ABNORMAL HIGH (ref 70–99)
Glucose-Capillary: 213 mg/dL — ABNORMAL HIGH (ref 70–99)
Glucose-Capillary: 226 mg/dL — ABNORMAL HIGH (ref 70–99)
Glucose-Capillary: 256 mg/dL — ABNORMAL HIGH (ref 70–99)
Glucose-Capillary: 286 mg/dL — ABNORMAL HIGH (ref 70–99)
Glucose-Capillary: 322 mg/dL — ABNORMAL HIGH (ref 70–99)
Glucose-Capillary: 352 mg/dL — ABNORMAL HIGH (ref 70–99)

## 2024-01-01 LAB — MRSA NEXT GEN BY PCR, NASAL: MRSA by PCR Next Gen: NOT DETECTED

## 2024-01-01 LAB — PHOSPHORUS: Phosphorus: >30 mg/dL — ABNORMAL HIGH (ref 2.5–4.6)

## 2024-01-01 LAB — BETA-HYDROXYBUTYRIC ACID
Beta-Hydroxybutyric Acid: 4.84 mmol/L — ABNORMAL HIGH (ref 0.05–0.27)
Beta-Hydroxybutyric Acid: 3.2 mmol/L — ABNORMAL HIGH (ref 0.05–0.27)

## 2024-01-01 LAB — CBG MONITORING, ED
Glucose-Capillary: 410 mg/dL — ABNORMAL HIGH (ref 70–99)
Glucose-Capillary: 361 mg/dL — ABNORMAL HIGH (ref 70–99)

## 2024-01-01 LAB — PROTIME-INR
INR: 1.3 — ABNORMAL HIGH (ref 0.8–1.2)
Prothrombin Time: 17.3 s — ABNORMAL HIGH (ref 11.4–15.2)

## 2024-01-01 LAB — MAGNESIUM: Magnesium: 2.9 mg/dL — ABNORMAL HIGH (ref 1.7–2.4)

## 2024-01-01 LAB — TSH: TSH: 0.497 u[IU]/mL (ref 0.350–4.500)

## 2024-01-01 LAB — LACTIC ACID, PLASMA: Lactic Acid, Venous: >9 mmol/L (ref 0.5–1.9)

## 2024-01-01 LAB — HIV ANTIBODY (ROUTINE TESTING W REFLEX): HIV Screen 4th Generation wRfx: NONREACTIVE

## 2024-01-01 LAB — LIPASE, BLOOD: Lipase: 69 U/L — ABNORMAL HIGH (ref 11–51)

## 2024-01-01 MED ORDER — AMIODARONE LOAD VIA INFUSION
150.0000 mg | Freq: Once | INTRAVENOUS | Status: AC
  Administered 2024-01-01: 150 mg via INTRAVENOUS
  Filled 2024-01-01: qty 83.34

## 2024-01-01 MED ORDER — SODIUM CHLORIDE 0.9 % IV SOLN: 250.0000 mL | INTRAVENOUS | Status: AC

## 2024-01-01 MED ORDER — PRISMASOL BGK 4/2.5 32-4-2.5 MEQ/L EC SOLN: Status: DC

## 2024-01-01 MED ORDER — SODIUM CHLORIDE 0.9 % IV SOLN: INTRAVENOUS | Status: AC | PRN

## 2024-01-01 MED ORDER — LACTATED RINGERS IV BOLUS
1000.0000 mL | Freq: Once | INTRAVENOUS | Status: AC
  Administered 2024-01-01: 1000 mL via INTRAVENOUS

## 2024-01-01 MED ORDER — CHLORHEXIDINE GLUCONATE CLOTH 2 % EX PADS
6.0000 | MEDICATED_PAD | Freq: Every day | CUTANEOUS | Status: DC
  Administered 2024-01-01 – 2024-01-04 (×4): 6 via TOPICAL

## 2024-01-01 MED ORDER — PIPERACILLIN-TAZOBACTAM IN DEX 2-0.25 GM/50ML IV SOLN
2.2500 g | Freq: Once | INTRAVENOUS | Status: AC
  Administered 2024-01-01: 2.25 g via INTRAVENOUS
  Filled 2024-01-01: qty 50

## 2024-01-01 MED ORDER — HEPARIN SODIUM (PORCINE) 1000 UNIT/ML DIALYSIS
1000.0000 [IU] | INTRAMUSCULAR | Status: DC | PRN
  Filled 2024-01-01: qty 5
  Filled 2024-01-01: qty 6

## 2024-01-01 MED ORDER — SODIUM BICARBONATE 8.4 % IV SOLN
INTRAVENOUS | Status: DC
  Filled 2024-01-01: qty 1000
  Filled 2024-01-01: qty 150

## 2024-01-01 MED ORDER — PHENYLEPHRINE HCL-NACL 20-0.9 MG/250ML-% IV SOLN
25.0000 ug/min | INTRAVENOUS | Status: DC
  Administered 2024-01-01: 200 ug/min via INTRAVENOUS
  Administered 2024-01-01: 25 ug/min via INTRAVENOUS
  Administered 2024-01-02: 100 ug/min via INTRAVENOUS
  Administered 2024-01-02: 130 ug/min via INTRAVENOUS
  Filled 2024-01-01 (×4): qty 250

## 2024-01-01 MED ORDER — OXYCODONE-ACETAMINOPHEN 5-325 MG PO TABS: 1.0000 | ORAL_TABLET | Freq: Four times a day (QID) | ORAL | Status: DC | PRN

## 2024-01-01 MED ORDER — HEPARIN SODIUM (PORCINE) 5000 UNIT/ML IJ SOLN
5000.0000 [IU] | Freq: Three times a day (TID) | INTRAMUSCULAR | Status: DC
  Administered 2024-01-01 – 2024-01-02 (×3): 5000 [IU] via SUBCUTANEOUS
  Filled 2024-01-01 (×3): qty 1

## 2024-01-01 MED ORDER — ONDANSETRON HCL 4 MG/2ML IJ SOLN
4.0000 mg | Freq: Once | INTRAMUSCULAR | Status: AC
  Administered 2024-01-01: 4 mg via INTRAVENOUS
  Filled 2024-01-01: qty 2

## 2024-01-01 MED ORDER — DEXTROSE IN LACTATED RINGERS 5 % IV SOLN: INTRAVENOUS | Status: DC

## 2024-01-01 MED ORDER — MAGNESIUM SULFATE 2 GM/50ML IV SOLN
2.0000 g | Freq: Once | INTRAVENOUS | Status: AC
  Administered 2024-01-01: 2 g via INTRAVENOUS
  Filled 2024-01-01: qty 50

## 2024-01-01 MED ORDER — DEXTROSE 50 % IV SOLN: 0.0000 mL | INTRAVENOUS | Status: DC | PRN

## 2024-01-01 MED ORDER — SODIUM CHLORIDE 0.9 % IV SOLN: Freq: Once | INTRAVENOUS | Status: AC

## 2024-01-01 MED ORDER — NOREPINEPHRINE 16 MG/250ML-% IV SOLN
0.0000 ug/min | INTRAVENOUS | Status: DC
  Administered 2024-01-02: 22 ug/min via INTRAVENOUS
  Administered 2024-01-02: 30 ug/min via INTRAVENOUS
  Administered 2024-01-02: 20 ug/min via INTRAVENOUS
  Administered 2024-01-03: 10 ug/min via INTRAVENOUS
  Filled 2024-01-01 (×4): qty 250

## 2024-01-01 MED ORDER — POTASSIUM CHLORIDE 10 MEQ/100ML IV SOLN
10.0000 meq | INTRAVENOUS | Status: AC
  Administered 2024-01-01 (×2): 10 meq via INTRAVENOUS
  Filled 2024-01-01 (×2): qty 100

## 2024-01-01 MED ORDER — SODIUM CHLORIDE 0.9 % IV BOLUS
1000.0000 mL | Freq: Once | INTRAVENOUS | Status: AC
  Administered 2024-01-01: 1000 mL via INTRAVENOUS

## 2024-01-01 MED ORDER — SODIUM BICARBONATE 8.4 % IV SOLN: 100.0000 meq | Freq: Once | INTRAVENOUS | Status: AC

## 2024-01-01 MED ORDER — SODIUM CHLORIDE 0.9 % FOR CRRT: INTRAVENOUS_CENTRAL | Status: DC | PRN

## 2024-01-01 MED ORDER — AMIODARONE HCL IN DEXTROSE 360-4.14 MG/200ML-% IV SOLN
60.0000 mg/h | INTRAVENOUS | Status: DC
  Administered 2024-01-01 – 2024-01-02 (×2): 60 mg/h via INTRAVENOUS
  Filled 2024-01-01 (×2): qty 200

## 2024-01-01 MED ORDER — VASOPRESSIN 20 UNITS/100 ML INFUSION FOR SHOCK
0.0000 [IU]/min | INTRAVENOUS | Status: DC
  Administered 2024-01-02 – 2024-01-03 (×5): 0.03 [IU]/min via INTRAVENOUS
  Filled 2024-01-01 (×6): qty 100

## 2024-01-01 MED ORDER — VANCOMYCIN HCL 2000 MG/400ML IV SOLN
2000.0000 mg | Freq: Once | INTRAVENOUS | Status: AC
  Administered 2024-01-01: 2000 mg via INTRAVENOUS
  Filled 2024-01-01: qty 400

## 2024-01-01 MED ORDER — SODIUM BICARBONATE 8.4 % IV SOLN
INTRAVENOUS | Status: AC
Start: 2024-01-01 — End: 2024-01-01
  Administered 2024-01-01: 100 meq via INTRAVENOUS
  Filled 2024-01-01: qty 100

## 2024-01-01 MED ORDER — VANCOMYCIN HCL 2000 MG/400ML IV SOLN
2000.0000 mg | Freq: Once | INTRAVENOUS | Status: DC
  Filled 2024-01-01: qty 400

## 2024-01-01 MED ORDER — PIPERACILLIN-TAZOBACTAM IN DEX 2-0.25 GM/50ML IV SOLN
2.2500 g | Freq: Once | INTRAVENOUS | Status: DC
  Filled 2024-01-01: qty 50

## 2024-01-01 MED ORDER — NOREPINEPHRINE 4 MG/250ML-% IV SOLN
0.0000 ug/min | INTRAVENOUS | Status: DC
  Administered 2024-01-01: 5 ug/min via INTRAVENOUS

## 2024-01-01 MED ORDER — CALCIUM GLUCONATE-NACL 1-0.675 GM/50ML-% IV SOLN
1.0000 g | Freq: Once | INTRAVENOUS | Status: AC
  Administered 2024-01-01: 1000 mg via INTRAVENOUS
  Filled 2024-01-01: qty 50

## 2024-01-01 MED ORDER — ACETAMINOPHEN 325 MG PO TABS
650.0000 mg | ORAL_TABLET | Freq: Four times a day (QID) | ORAL | Status: DC | PRN
  Administered 2024-01-02 – 2024-01-10 (×3): 650 mg via ORAL
  Filled 2024-01-01 (×3): qty 2

## 2024-01-01 MED ORDER — LACTATED RINGERS IV BOLUS (SEPSIS)
1000.0000 mL | Freq: Once | INTRAVENOUS | Status: AC
  Administered 2024-01-01: 1000 mL via INTRAVENOUS

## 2024-01-01 MED ORDER — LACTATED RINGERS IV SOLN: INTRAVENOUS | Status: DC

## 2024-01-01 MED ORDER — OXYCODONE HCL 5 MG PO TABS
5.0000 mg | ORAL_TABLET | Freq: Four times a day (QID) | ORAL | Status: DC | PRN
  Administered 2024-01-03 – 2024-01-09 (×5): 5 mg via ORAL
  Filled 2024-01-01 (×5): qty 1

## 2024-01-01 MED ORDER — HYDROMORPHONE HCL 1 MG/ML IJ SOLN
1.0000 mg | Freq: Once | INTRAMUSCULAR | Status: AC
  Administered 2024-01-01: 1 mg via INTRAVENOUS
  Filled 2024-01-01: qty 1

## 2024-01-01 MED ORDER — NOREPINEPHRINE 4 MG/250ML-% IV SOLN
INTRAVENOUS | Status: AC
  Filled 2024-01-01: qty 250

## 2024-01-01 MED ORDER — INSULIN REGULAR(HUMAN) IN NACL 100-0.9 UT/100ML-% IV SOLN
INTRAVENOUS | Status: DC
  Administered 2024-01-01 (×2): 11.5 [IU]/h via INTRAVENOUS
  Filled 2024-01-01 (×2): qty 100

## 2024-01-01 MED ORDER — AMIODARONE HCL IN DEXTROSE 360-4.14 MG/200ML-% IV SOLN
30.0000 mg/h | INTRAVENOUS | Status: DC
  Administered 2024-01-02 – 2024-01-04 (×6): 30 mg/h via INTRAVENOUS
  Filled 2024-01-01 (×5): qty 200

## 2024-01-01 NOTE — ED Notes (Signed)
 Pt transported to CT ?

## 2024-01-01 NOTE — Consult Note (Signed)
 Reason for Consult: Acute kidney injury, metabolic acidosis Referring Physician: Dorn Chill, MD (CCM)  HPI:  68 year old man with past medical history significant for type 2 diabetes mellitus, hypertension, dyslipidemia and chronic back pain on chronic opiate therapy.  He appears to have normal renal function at baseline with a creatinine of 0.8 two months ago.  He presented to the emergency room via EMS with concerns of nausea, vomiting, diarrhea and abdominal pain for about a week following Mounjaro  injection last week.  He has had diminishing urine output over the past week.  In the emergency room noted to have an elevated creatinine of 12.2 with metabolic acidosis (venous pH is 6.9 with serum bicarbonate <7).  He is being admitted with suspected DKA with lactic acidosis likely related to preceding metformin  use.  He reports to have been taking his lisinopril /HCTZ and Jardiance  as well up until yesterday.  He denies any preceding dysuria, urgency, frequency, flank pain, fever or chills and does not have any history of hematuria.  He denies any hematochezia or melena and did not have hematemesis.  He denies any preceding use of NSAIDs.  Past Medical History:  Diagnosis Date   Arthritis    Chronic back pain    on chronic opioids   Diabetes mellitus without complication (HCC)    GERD (gastroesophageal reflux disease)    HLD (hyperlipidemia)    Hypertension    no meds   Vitamin D  deficiency     Past Surgical History:  Procedure Laterality Date   ANTERIOR CERVICAL DECOMP/DISCECTOMY FUSION N/A 09/05/2022   Procedure: Anterior Cervical Decompression Fusion  - Cervical three-Cervical four;  Surgeon: Louis Shove, MD;  Location: [facility] OR;  Service: Neurosurgery;  Laterality: N/A;   CERVICAL DISC SURGERY  99   LUMBAR FUSION     LUMBAR LAMINECTOMY/DECOMPRESSION MICRODISCECTOMY Bilateral 03/09/2021   Procedure: Laminectomy and Foraminotomy - bilateral - Lumbar two-Lumbar three;  Surgeon: Louis Shove, MD;  Location: [facility] OR;  Service: Neurosurgery;  Laterality: Bilateral;   ROTATOR CUFF REPAIR Bilateral    SHOULDER ARTHROSCOPY WITH ROTATOR CUFF REPAIR AND SUBACROMIAL DECOMPRESSION Right 12/30/2018   Procedure: SHOULDER ARTHROSCOPY WITH ROTATOR CUFF REPAIR AND SUBACROMIAL DECOMPRESSION, DISTAL CLAVICLE EXICISION,;  Surgeon: Cristy Bonner DASEN, MD;  Location: [facility];  Service: Orthopedics;  Laterality: Right;   SHOULDER ARTHROSCOPY WITH SUBACROMIAL DECOMPRESSION, ROTATOR CUFF REPAIR AND BICEP TENDON REPAIR Left 06/19/2019   Procedure: LEFT SHOULDER ARTHROSCOPY, EXTENTSIVE DEBRIDEMENT, DISTAL CLAVICULECTOMY, SUBACROMIAL DECOMPRESSION, PARTIAL ACROMIOPLASTY WITH CORACOACROMIAL RELEASE, ROTATOR CUFFF REPAIR AND BICEP TENODESIS;  Surgeon: Cristy Bonner DASEN, MD;  Location: [facility];  Service: Orthopedics;  Laterality: Left;    Family History  Problem Relation Age of Onset   Heart disease Mother    Diabetes Mother    Amblyopia Neg Hx    Blindness Neg Hx    Cataracts Neg Hx    Hypertension Neg Hx    Macular degeneration Neg Hx    Retinal detachment Neg Hx    Strabismus Neg Hx    Esophageal cancer Neg Hx    Colon cancer Neg Hx    Pancreatic cancer Neg Hx    Ulcerative colitis Neg Hx    Inflammatory bowel disease Neg Hx    Liver disease Neg Hx    Rectal cancer Neg Hx    Stomach cancer Neg Hx     Social History:  reports that he quit smoking about 3 years ago. His smoking use included cigarettes. He started smoking about 28  years ago. He has a 6.3 pack-year smoking history. He has never used smokeless tobacco. He reports current alcohol use of about 4.0 standard drinks of alcohol per week. He reports that he does not use drugs.  Allergies:  Allergies  Allergen Reactions   Cymbalta [Duloxetine Hcl] Nausea And Vomiting and Other (See Comments)    Dizziness/headaches   Shrimp [Shellfish Allergy]     intolerant   Valium  [Diazepam ] Itching    Medications: I  have reviewed the patient's current medications. Scheduled:  Chlorhexidine  Gluconate Cloth  6 each Topical Daily   heparin   5,000 Units Subcutaneous Q8H   Continuous:  sodium chloride  10 mL/hr at 01/01/24 1643   calcium  gluconate     dextrose  5% lactated ringers      insulin  11.5 Units/hr (01/01/24 1600)   lactated ringers      piperacillin -tazobactam (ZOSYN )  IV Stopped (01/01/24 1534)   potassium chloride      vancomycin  Stopped (01/01/24 1535)      Latest Ref Rng & Units 01/01/2024    2:40 PM 01/01/2024    2:10 PM 10/15/2023    3:01 PM  BMP  Glucose 70 - 99 mg/dL 604  606  854   BUN 8 - 23 mg/dL 93  85  11   Creatinine 0.61 - 1.24 mg/dL 87.79  87.78  9.23   BUN/Creat Ratio 10 - 24   14   Sodium 135 - 145 mmol/L 135 - 145 mmol/L 121    121  127  131   Potassium 3.5 - 5.1 mmol/L 3.5 - 5.1 mmol/L 4.6    4.7  5.0  5.0   Chloride 98 - 111 mmol/L 91  83  94   CO2 22 - 32 mmol/L  <7  21   Calcium  8.9 - 10.3 mg/dL  7.6  9.3       Latest Ref Rng & Units 01/01/2024    2:40 PM 01/01/2024    2:10 PM 10/15/2023    3:01 PM  CBC  WBC 4.0 - 10.5 K/uL  16.4  9.8   Hemoglobin 13.0 - 17.0 g/dL 86.9 - 82.9 g/dL 86.6    85.6  87.2  87.5   Hematocrit 39.0 - 52.0 % 39.0 - 52.0 % 39.0    42.0  39.7  38.7   Platelets 150 - 400 K/uL  406  237    Urinalysis Pending  CT ABDOMEN PELVIS WO CONTRAST Result Date: 01/01/2024 CLINICAL DATA:  Abdominal pain, acute, nonlocalized. EXAM: CT ABDOMEN AND PELVIS WITHOUT CONTRAST TECHNIQUE: Multidetector CT imaging of the abdomen and pelvis was performed following the standard protocol without IV contrast. RADIATION DOSE REDUCTION: This exam was performed according to the departmental dose-optimization program which includes automated exposure control, adjustment of the mA and/or kV according to patient size and/or use of iterative reconstruction technique. COMPARISON:  CT abdomen/pelvis dated 08/30/2020. FINDINGS: Lower chest: No acute abnormality.  Hepatobiliary: No focal liver abnormality is seen. No gallstones, gallbladder wall thickening, or biliary dilatation. Pancreas: Unremarkable. No pancreatic ductal dilatation or surrounding inflammatory changes. Spleen: Normal in size without focal abnormality. Adrenals/Urinary Tract: Adrenal glands are unremarkable. No urolithiasis or hydronephrosis. Bladder is minimally distended and otherwise grossly unremarkable. Stomach/Bowel: Stomach is within normal limits. Appendix appears normal. No evidence of bowel wall thickening, distention, or inflammatory changes. Vascular/Lymphatic: Abdominal aorta is normal in caliber with scattered atherosclerotic calcification. No enlarged abdominal or pelvic lymph nodes. Reproductive: Prostate is enlarged measuring up to 5.4 cm diameter, similar to the prior exam.  Other: No abdominal wall hernia or abnormality. No abdominopelvic ascites. No free air. Musculoskeletal: No acute osseous abnormality. No suspicious osseous lesion. Status post PLIF L4-S1. Multilevel degenerative changes of the thoracolumbar spine. IMPRESSION: 1. No acute localizing findings in the abdomen or pelvis. 2. Mild prostatomegaly. 3.  Aortic Atherosclerosis (ICD10-I70.0). Electronically Signed   By: Harrietta Sherry M.D.   On: 01/01/2024 15:50   DG Chest Port 1 View Result Date: 01/01/2024 CLINICAL DATA:  Nausea, vomiting, and diarrhea.  Possible sepsis. EXAM: PORTABLE CHEST 1 VIEW COMPARISON:  10/31/2011 FINDINGS: Thoracic spondylosis. Cardiac and mediastinal margins appear normal. The lungs appear clear. No blunting of the costophrenic angles. Moderate degenerative glenohumeral arthropathy bilaterally. IMPRESSION: 1. No acute findings. 2. Thoracic spondylosis. 3. Moderate degenerative glenohumeral arthropathy bilaterally. Electronically Signed   By: Ryan Salvage M.D.   On: 01/01/2024 14:36    Review of Systems  Constitutional:  Positive for appetite change, chills and fatigue. Negative for fever.   HENT:  Negative for nosebleeds, sore throat and trouble swallowing.   Respiratory:  Negative for cough, shortness of breath and wheezing.   Cardiovascular:  Negative for chest pain and leg swelling.  Gastrointestinal:  Positive for abdominal pain, diarrhea, nausea and vomiting. Negative for blood in stool and constipation.  Genitourinary:  Positive for decreased urine volume and difficulty urinating. Negative for frequency, hematuria and urgency.  Musculoskeletal:  Positive for back pain. Negative for myalgias and neck pain.  Skin:  Negative for rash and wound.  Neurological:  Positive for weakness and light-headedness.   Blood pressure (!) 95/45, pulse 100, temperature (!) 93.4 F (34.1 C), temperature source Rectal, resp. rate (!) 27, height 6' (1.829 m), weight 104.8 kg, SpO2 99%. Physical Exam Vitals reviewed.  Constitutional:      Appearance: He is well-developed and normal weight. He is ill-appearing.  HENT:     Head: Normocephalic and atraumatic.     Mouth/Throat:     Pharynx: Oropharynx is clear.  Eyes:     General: No scleral icterus.    Extraocular Movements: Extraocular movements intact.  Cardiovascular:     Rate and Rhythm: Regular rhythm. Tachycardia present.     Heart sounds: Normal heart sounds.  Pulmonary:     Effort: Pulmonary effort is normal.     Breath sounds: Normal breath sounds. No wheezing.  Abdominal:     General: Abdomen is flat. Bowel sounds are normal.     Palpations: Abdomen is soft.     Tenderness: There is abdominal tenderness in the epigastric area.  Musculoskeletal:     Right lower leg: No edema.     Left lower leg: No edema.  Skin:    General: Skin is warm and dry.  Neurological:     General: No focal deficit present.     Mental Status: He is alert.     Assessment/Plan: 1.  Acute kidney injury: Appears to be hemodynamically mediated with preceding excessive GI losses and ongoing ACE inhibitor/thiazide diuretic as well as Jardiance .  This  is most likely prerenal acute kidney injury but may have evolved into ATN if sustained for long enough.  Agree with aggressive intravenous fluids for volume replacement and management of diabetic ketoacidosis as we hold lisinopril /HCTZ and Jardiance .  CT scan of the abdomen did not show any obstruction but showed a mildly distended bladder.  Agree with placement of Foley catheter.  Will send off for urine electrolytes. Avoid nephrotoxic medications including NSAIDs and iodinated intravenous contrast exposure unless the latter  is absolutely indicated.  Preferred narcotic agents for pain control are hydromorphone , fentanyl , and methadone. Morphine  should not be used. Avoid Baclofen and avoid oral sodium phosphate  and magnesium  citrate based laxatives / bowel preps. Continue strict Input and Output monitoring. Will monitor the patient closely with you and intervene or adjust therapy as indicated by changes in clinical status/labs. 2.  Metabolic acidosis/acidemia: Awaiting basic metabolic panel but data largely pointing to metabolic acidosis likely arising from lactic acidosis (metformin  use/hypoperfusion) and combination of DKA as well as acute kidney injury.  Monitor with IV fluids. 3.  Diabetic ketoacidosis: Started on aggressive fluid replacement as well as insulin  therapy for glycemic management. 4.  Hyponatremia: Hypovolemic hyponatremia compounded by pseudohyponatremia from hyperglycemia.  Monitor with glycemic control/intravenous fluids.    Riley Clarke 01/01/2024, 4:37 PM

## 2024-01-01 NOTE — Procedures (Signed)
 Central Venous Catheter Insertion Procedure Note  Riley Clarke  [id number]  Nov 05, 1955  Date:01/01/24  Time:11:21 PM   Provider Performing:Adaiah Morken D Emilio   Procedure: Insertion of Non-tunneled Central Venous Catheter(36556)with US  guidance (23062)    Indication(s) Medication administration and Hemodialysis  Consent Risks of the procedure as well as the alternatives and risks of each were explained to the patient and/or caregiver.  Consent for the procedure was obtained and is signed in the bedside chart  Anesthesia Topical only with 1% lidocaine    Timeout Verified patient identification, verified procedure, site/side was marked, verified correct patient position, special equipment/implants available, medications/allergies/relevant history reviewed, required imaging and test results available.  Sterile Technique Maximal sterile technique including full sterile barrier drape, hand hygiene, sterile gown, sterile gloves, mask, hair covering, sterile ultrasound probe cover (if used).  Procedure Description Area of catheter insertion was cleaned with chlorhexidine  and draped in sterile fashion.   With real-time ultrasound guidance a HD catheter was placed into the right internal jugular vein.  Nonpulsatile blood flow and easy flushing noted in all ports.  The catheter was sutured in place and sterile dressing applied.  Complications/Tolerance None; patient tolerated the procedure well. Chest X-ray is ordered to verify placement for internal jugular or subclavian cannulation.  Chest x-ray is not ordered for femoral cannulation.  EBL Minimal  Specimen(s) None  JD Emilio RIGGERS [facility] 01/01/2024, 11:21 PM  Please see [email or web address] for pager details.  From 7A-7P if no response, please call [phone]. After hours, please call ELink [phone].

## 2024-01-01 NOTE — ED Provider Notes (Signed)
 [place] EMERGENCY DEPARTMENT AT [facility] Provider Note   CSN: [id number] Arrival date & time: 01/01/24  1311     Patient presents with: Abdominal Pain   Riley Clarke is a 68 y.o. male presenting to the ED with abdominal pain, nausea and vomiting.  Reports onset 1 week ago after getting 2nd dose of Monjarno.  Difficulty keeping down fluids.  Feels diffuse abdominal pain. Lots of diarrhea at home.   HPI     Prior to Admission medications   Medication Sig Start Date End Date Taking? Authorizing Provider  Blood Glucose Monitoring Suppl DEVI 1 each by Does not apply route in the morning and at bedtime. May substitute to any manufacturer covered by patient's insurance. 02/27/23   [name], NP  cyclobenzaprine  (FLEXERIL ) 10 MG tablet TAKE 1 TABLET(10 MG) BY MOUTH THREE TIMES DAILY AS NEEDED FOR MUSCLE SPASMS 11/06/23   [name], NP  empagliflozin  (JARDIANCE ) 25 MG TABS tablet Take 1 tablet (25 mg total) by mouth daily. Patient not taking: Reported on 12/27/2023 03/14/23   [name], NP  esomeprazole  (NEXIUM ) 40 MG capsule TAKE 1 CAPSULE BY MOUTH EVERY MORNING BEFORE BREAKFAST 10/15/23   [name], NP  gabapentin  (NEURONTIN ) 300 MG capsule Take 1 capsule (300 mg total) by mouth 3 (three) times daily. 04/20/23   Nichols, [name] S, NP  hydrocortisone  (ANUSOL -HC) 2.5 % rectal cream Place 1 Application rectally 2 (two) times daily. Use for 10 days 01/29/23   Kerman Vina HERO, NP  lisinopril -hydrochlorothiazide  (ZESTORETIC ) 20-25 MG tablet TAKE 1 TABLET BY MOUTH DAILY 10/02/23 10/01/24  Nichols, [name] S, NP  metFORMIN  (GLUCOPHAGE ) 1000 MG tablet TAKE 1 TABLET(1000 MG) BY MOUTH TWICE DAILY WITH A MEAL Patient not taking: Reported on 12/27/2023 10/15/23   [name], NP  oxyCODONE -acetaminophen  (PERCOCET) 10-325 MG tablet Take 1 tablet by mouth 4 (four) times daily as needed for pain. 08/11/22   [provider]  simvastatin  (ZOCOR ) 10 MG tablet TAKE 1  TABLET(10 MG) BY MOUTH DAILY 10/08/23   Nichols, [name] S, NP  tirzepatide  (MOUNJARO ) 7.5 MG/0.5ML Pen Inject 7.5 mg into the skin once a week. Patient not taking: Reported on 12/27/2023 11/14/23   [name], NP    Allergies: Cymbalta [duloxetine hcl], Shrimp [shellfish allergy], and Valium  Deen.Dandy ]    Review of Systems  Updated Vital Signs BP (!) 95/45   Pulse 100   Temp (!) 93.4 F (34.1 C) (Rectal)   Resp (!) 27   Ht 6' (1.829 m)   Wt 104.8 kg   SpO2 99%   BMI 31.33 kg/m   Physical Exam Constitutional:      General: He is not in acute distress. HENT:     Head: Normocephalic and atraumatic.  Eyes:     Conjunctiva/sclera: Conjunctivae normal.     Pupils: Pupils are equal, round, and reactive to light.  Cardiovascular:     Rate and Rhythm: Normal rate and regular rhythm.  Pulmonary:     Effort: Pulmonary effort is normal. No respiratory distress.     Comments: Tachypneic Abdominal:     General: There is no distension.     Tenderness: There is generalized abdominal tenderness.  Skin:    General: Skin is warm and dry.  Neurological:     General: No focal deficit present.     Mental Status: He is alert. Mental status is at baseline.  Psychiatric:        Mood and Affect:  Mood normal.        Behavior: Behavior normal.     (all labs ordered are listed, but only abnormal results are displayed) Labs Reviewed  CBC WITH DIFFERENTIAL/PLATELET - Abnormal; Notable for the following components:      Result Value   WBC 16.4 (*)    Hemoglobin 12.7 (*)    MCV 76.8 (*)    MCH 24.6 (*)    RDW 15.9 (*)    Platelets 406 (*)    Neutro Abs 12.5 (*)    Monocytes Absolute 2.5 (*)    All other components within normal limits  PROTIME-INR - Abnormal; Notable for the following components:   Prothrombin Time 17.3 (*)    INR 1.3 (*)    All other components within normal limits  I-STAT CG4 LACTIC ACID, ED - Abnormal; Notable for the following components:   Lactic Acid, Venous  10.7 (*)    All other components within normal limits  I-STAT VENOUS BLOOD GAS, ED - Abnormal; Notable for the following components:   pH, Ven 6.963 (*)    pCO2, Ven 18.7 (*)    pO2, Ven 57 (*)    Bicarbonate 4.2 (*)    TCO2 <5 (*)    Acid-base deficit 26.0 (*)    Sodium 121 (*)    Calcium , Ion 0.88 (*)    All other components within normal limits  I-STAT CHEM 8, ED - Abnormal; Notable for the following components:   Sodium 121 (*)    Chloride 91 (*)    BUN 93 (*)    Creatinine, Ser 12.20 (*)    Glucose, Bld 395 (*)    Calcium , Ion 0.88 (*)    TCO2 7 (*)    All other components within normal limits  CBG MONITORING, ED - Abnormal; Notable for the following components:   Glucose-Capillary 410 (*)    All other components within normal limits  CBG MONITORING, ED - Abnormal; Notable for the following components:   Glucose-Capillary 361 (*)    All other components within normal limits  C DIFFICILE QUICK SCREEN W PCR REFLEX    GASTROINTESTINAL PANEL BY PCR, STOOL (REPLACES STOOL CULTURE)  RESP PANEL BY RT-PCR (RSV, FLU A&B, COVID)  RVPGX2  COMPREHENSIVE METABOLIC PANEL WITH GFR  URINALYSIS, W/ REFLEX TO CULTURE (INFECTION SUSPECTED)  BETA-HYDROXYBUTYRIC ACID  LIPASE, BLOOD  HIV ANTIBODY (ROUTINE TESTING W REFLEX)  CBC  BASIC METABOLIC PANEL WITH GFR  BASIC METABOLIC PANEL WITH GFR  BASIC METABOLIC PANEL WITH GFR  BASIC METABOLIC PANEL WITH GFR  BETA-HYDROXYBUTYRIC ACID  BETA-HYDROXYBUTYRIC ACID  BETA-HYDROXYBUTYRIC ACID  BETA-HYDROXYBUTYRIC ACID  URINALYSIS, ROUTINE W REFLEX MICROSCOPIC  I-STAT CG4 LACTIC ACID, ED    EKG: None  Radiology: CT ABDOMEN PELVIS WO CONTRAST Result Date: 01/01/2024 CLINICAL DATA:  Abdominal pain, acute, nonlocalized. EXAM: CT ABDOMEN AND PELVIS WITHOUT CONTRAST TECHNIQUE: Multidetector CT imaging of the abdomen and pelvis was performed following the standard protocol without IV contrast. RADIATION DOSE REDUCTION: This exam was performed  according to the departmental dose-optimization program which includes automated exposure control, adjustment of the mA and/or kV according to patient size and/or use of iterative reconstruction technique. COMPARISON:  CT abdomen/pelvis dated 08/30/2020. FINDINGS: Lower chest: No acute abnormality. Hepatobiliary: No focal liver abnormality is seen. No gallstones, gallbladder wall thickening, or biliary dilatation. Pancreas: Unremarkable. No pancreatic ductal dilatation or surrounding inflammatory changes. Spleen: Normal in size without focal abnormality. Adrenals/Urinary Tract: Adrenal glands are unremarkable. No urolithiasis or hydronephrosis. Bladder is minimally  distended and otherwise grossly unremarkable. Stomach/Bowel: Stomach is within normal limits. Appendix appears normal. No evidence of bowel wall thickening, distention, or inflammatory changes. Vascular/Lymphatic: Abdominal aorta is normal in caliber with scattered atherosclerotic calcification. No enlarged abdominal or pelvic lymph nodes. Reproductive: Prostate is enlarged measuring up to 5.4 cm diameter, similar to the prior exam. Other: No abdominal wall hernia or abnormality. No abdominopelvic ascites. No free air. Musculoskeletal: No acute osseous abnormality. No suspicious osseous lesion. Status post PLIF L4-S1. Multilevel degenerative changes of the thoracolumbar spine. IMPRESSION: 1. No acute localizing findings in the abdomen or pelvis. 2. Mild prostatomegaly. 3.  Aortic Atherosclerosis (ICD10-I70.0). Electronically Signed   By: Harrietta Sherry M.D.   On: 01/01/2024 15:50   DG Chest Port 1 View Result Date: 01/01/2024 CLINICAL DATA:  Nausea, vomiting, and diarrhea.  Possible sepsis. EXAM: PORTABLE CHEST 1 VIEW COMPARISON:  10/31/2011 FINDINGS: Thoracic spondylosis. Cardiac and mediastinal margins appear normal. The lungs appear clear. No blunting of the costophrenic angles. Moderate degenerative glenohumeral arthropathy bilaterally.  IMPRESSION: 1. No acute findings. 2. Thoracic spondylosis. 3. Moderate degenerative glenohumeral arthropathy bilaterally. Electronically Signed   By: Ryan Salvage M.D.   On: 01/01/2024 14:36     .Critical Care  Performed by: Cottie Donnice PARAS, MD Authorized by: Cottie Donnice PARAS, MD   Critical care provider statement:    Critical care time (minutes):  45   Critical care time was exclusive of:  Separately billable procedures and treating other patients   Critical care was necessary to treat or prevent imminent or life-threatening deterioration of the following conditions:  Metabolic crisis and dehydration   Critical care was time spent personally by me on the following activities:  Ordering and performing treatments and interventions, ordering and review of laboratory studies, ordering and review of radiographic studies, pulse oximetry, review of old charts, examination of patient and evaluation of patient's response to treatment    Medications Ordered in the ED  piperacillin -tazobactam (ZOSYN ) IVPB 2.25 g (0 g Intravenous Hold 01/01/24 1534)  vancomycin  (VANCOREADY) IVPB 2000 mg/400 mL (0 mg Intravenous Hold 01/01/24 1535)  heparin  injection 5,000 Units (has no administration in time range)  insulin  regular, human (MYXREDLIN ) 100 units/ 100 mL infusion (11.5 Units/hr Intravenous New Bag/Given 01/01/24 1600)  lactated ringers  infusion (has no administration in time range)  dextrose  5 % in lactated ringers  infusion (has no administration in time range)  dextrose  50 % solution 0-50 mL (has no administration in time range)  potassium chloride  10 mEq in 100 mL IVPB (has no administration in time range)  oxyCODONE  (Oxy IR/ROXICODONE ) immediate release tablet 5 mg (has no administration in time range)  acetaminophen  (TYLENOL ) tablet 650 mg (has no administration in time range)  sodium chloride  0.9 % bolus 1,000 mL (0 mLs Intravenous Stopped 01/01/24 1358)  lactated ringers  bolus 1,000 mL (0 mLs  Intravenous Stopped 01/01/24 1503)  ondansetron  (ZOFRAN ) injection 4 mg (4 mg Intravenous Given 01/01/24 1348)  HYDROmorphone  (DILAUDID ) injection 1 mg (1 mg Intravenous Given 01/01/24 1348)  lactated ringers  bolus 1,000 mL (1,000 mLs Intravenous New Bag/Given 01/01/24 1605)    Clinical Course as of 01/01/24 1615  Tue Jan 01, 2024  1456 Critical care consulted [MT]  1458 Requested RN to bladder scan patient to evaluate for urine production [MT]    Clinical Course User Index [MT] Cottie Donnice PARAS, MD  Medical Decision Making Amount and/or Complexity of Data Reviewed Labs: ordered. Radiology: ordered.  Risk Prescription drug management. Decision regarding hospitalization.   This patient presents to the ED with concern for abdominal pain, nausea vomiting. This involves an extensive number of treatment options, and is a complaint that carries with it a high risk of complications and morbidity.  The differential diagnosis includes DKA vs viral illness vs medication side effect vs pancreatitis vs biliary disease vs other intraabdomainl process  Co-morbidities that complicate the patient evaluation: hx of diabetes at risk of complications  Additional history obtained from EMS  External records from outside source obtained and reviewed including ha1c level 10/15/23 was 10.3  I ordered and personally interpreted labs.  The pertinent results include:  severe acidosis, ph 6.9, bicarb low, WBC 16.4, Lactate 10.7, Na 121, Cr 12.2  I ordered imaging studies including dg chest, ct abdomen pelvis (no contrast given renal failure) I independently visualized and interpreted imaging which showed no emergent findings I agree with the radiologist interpretation  The patient was maintained on a cardiac monitor.  I personally viewed and interpreted the cardiac monitored which showed an underlying rhythm of: sinus tachycardia  I ordered medication including 2 liter total  fluids (1 L Lr and 1 L NS) given renal failure concerns.  IV dilaudid  and nausea medications for pain.   I have reviewed the patients home medicines and have made adjustments as needed  Test Considered: doubt acute PE  Admission to ICU for DKA, renal failure, hyponatremia  I paged nephrology but did not hear back from them prior to patient's ICU transfer and admission.  I notified ICU admitting team who will repage nephrology.  Dispostion:  After consideration of the diagnostic results and the patients response to treatment, I feel that the patent would benefit from medical admission      Final diagnoses:  Diabetic ketoacidosis without coma associated with other specified diabetes mellitus (HCC)  Hyponatremia  Sepsis, due to unspecified organism, unspecified whether acute organ dysfunction present [facility])    ED Discharge Orders     None          Cottie Donnice PARAS, MD 01/01/24 1615

## 2024-01-01 NOTE — ED Notes (Signed)
 Called phlebotomy to obtain labs. This RN was unable to obtain labs.

## 2024-01-01 NOTE — H&P (Signed)
 NAME:  Riley Clarke, MRN:  [id number], DOB:  1956/01/04, LOS: 0 ADMISSION DATE:  01/01/2024, CONSULTATION DATE:  01/01/24  REFERRING MD: Cottie CHIEF COMPLAINT: N/V/D  History of Present Illness:  Pt is a with significant pmhx of diabetes 2, HLD, HTN, Chronic Back pain, and Arthritis who presents by EMS from home with N/V/D for approxmiately the last 7 days and abdominal pain. Patient states that patient had a mounjaro  injection last Monday 12/24/23, which he contributes to the abdominal pain.   Initial Istat labs revealed- metabolic acidosis- venous pH 6.963, lactic acid of 10.7, creatinine of 12.20, glucose of 395, BUN of 93. CBC 16.4, HCT 39, and platelets 406-differential still pending. Concern for DKA and Renal Failure. PCCM consulted for admitted due to metabolic derangements along with Nephro consult.   CT of abdomen without contrast pending along with BMET  Patient has received 2 Liters of fluid resuscitation as well (1 Liter of LR and 1 liter Normal Saline).   Upon initial assessment of patient, patient having kussmaul respirations, tachycardia- HR 100s, BP within normal limits, O2 sat 99% on RA. Patient states that s/s began about a week ago after mounjaro  injection. Patient reports symptoms of n,v, diarrhea, and having decreased to no urine output. Patient endorses that he has only had 3 episodes of vomiting but diarrhea since the start of all this with generalized abdominal pain. Patient denies any sick contacts, eating any different/or contaminated foods, fever, chills, headache, blood in stool or emesis.   Pertinent  Medical History   Past Medical History:  Diagnosis Date   Arthritis    Chronic back pain    on chronic opioids   Diabetes mellitus without complication (HCC)    GERD (gastroesophageal reflux disease)    HLD (hyperlipidemia)    Hypertension    no meds   Vitamin D  deficiency      Significant Hospital Events: Including procedures, antibiotic start and stop  dates in addition to other pertinent events   7/22 PCCM admit for DKA, Renal failure- Cr  12.20, lactic acid 10.7, nephro consulted   Interim History / Subjective:  Patient ill appearing, with Kussmaul breathing  Vital Signs stable at this time with fluid resuscitation  Insulin  gtt ordered with maintenance fluids    Objective    Blood pressure (!) 95/45, pulse 100, temperature 98.3 F (36.8 C), resp. rate (!) 27, SpO2 99%.        Intake/Output Summary (Last 24 hours) at 01/01/2024 1500 Last data filed at 01/01/2024 1358 Gross per 24 hour  Intake 766.5 ml  Output --  Net 766.5 ml   There were no vitals filed for this visit.  Examination: General: acute on chronically ill appearing adult male, lying on ED stretcher  HEENT: Normocephalic, PERRLA intact, missing teeth, Pink MM CV: s1,s2, RRR, no MRG, No JVD  pulm: clear, diminished, no distress, kussmaul respirations  Abs: bs active, soft  Extremities: no edema, no deformity, moves all extremities on command  Skin: no rash  Neuro: Rass 0, alert and oriented x 4 GU: intact - bladder scan zero   Resolved problem list   Assessment and Plan  Metabolic Acidosis  Lactic Acidosis  DKA  - Suspect DKA, will elevated lactate secondary DKA with continued metformin  use per patient, and developing renal failure secondary to dehydration due to vomiting, lact of intake, and diarrhea but cannot at this time rule out infectious process. CT abdomen pending for abdominal pain along with Cdiff/GI panel P:  Continue fluid resuscitation- give additional 1 LR  Along with continuous LR infusion per DKA protocol  Trend Lactic acid, beta hydroxy  MAP goal > 65- no need for pressors at this time  Obtain Cdiff, GI panel per stool, as well as  Follow up with CT results for acute abdominal processes Consider broad spectrum abx  Place on enteric precautions   Acute Renal Failure  Acute Kidney Injury  P: Continue to trend renal function daily   Continue to monitor and optimize electrolytes daily Continue to monitor urine output Continue strict I/Os Continue Adequate renal perfusion  Avoid nephrotoxic agents  Hold Jardiance , metformin , lisinopril -hydrochlorothiazide  for now  Nephro consulted  Bladder Scan q 6hr- may need to place foley cath   DM2  Poorly controlled Type 2, hgb A1c 10.3 on 10/15/23  P:  Initiate DKA protocol  Hold metformin  at this time due to elevated lactate/renal failure  Will need diabetes coordinator Will also need diet/exercise counseling once medically stable  Obtain beta-hydroxy and trend, along with BMETs q 4 Watch for electrolyte imbalances, optimizes   HTN  HLD  On lisinopril /hydrochlorothiazide  P: Hold in setting of Acute Renal failure  Hold statin in setting of abdominal pain, diarrhea at this time   GERD  P:  PPI   Chronic Pain  Arthritis  On gabapentin  at home as well as Percocet  P: Tylenol  q 6hr for mild  Oxy IR 5mg  for severe pain    Best Practice (right click and Reselect all SmartList Selections daily)   Diet/type: NPO DVT prophylaxis prophylactic heparin   Pressure ulcer(s): N/A GI prophylaxis: PPI Lines: N/A Foley:  N/A Code Status:  full code Last date of multidisciplinary goals of care discussion [updated patient at beside in ED- 01/01/24   Labs   CBC: Recent Labs  Lab 01/01/24 1440  HGB 13.3  14.3  HCT 39.0  42.0    Basic Metabolic Panel: Recent Labs  Lab 01/01/24 1440  NA 121*  121*  K 4.6  4.7  CL 91*  GLUCOSE 395*  BUN 93*  CREATININE 12.20*   GFR: Estimated Creatinine Clearance: 7.3 mL/min (A) (by C-G formula based on SCr of 12.2 mg/dL (H)). Recent Labs  Lab 01/01/24 1440  LATICACIDVEN 10.7*    Liver Function Tests: No results for input(s): AST, ALT, ALKPHOS, BILITOT, PROT, ALBUMIN in the last 168 hours. No results for input(s): LIPASE, AMYLASE in the last 168 hours. No results for input(s): AMMONIA in the last  168 hours.  ABG    Component Value Date/Time   HCO3 4.2 (L) 01/01/2024 1440   TCO2 <5 (L) 01/01/2024 1440   TCO2 7 (L) 01/01/2024 1440   ACIDBASEDEF 26.0 (H) 01/01/2024 1440   O2SAT 71 01/01/2024 1440     Coagulation Profile: No results for input(s): INR, PROTIME in the last 168 hours.  Cardiac Enzymes: No results for input(s): CKTOTAL, CKMB, CKMBINDEX, TROPONINI in the last 168 hours.  HbA1C: Hemoglobin A1C  Date/Time Value Ref Range Status  10/15/2023 02:34 PM 10.3 (A) 4.0 - 5.6 % Final  04/16/2023 03:43 PM 8.1 (A) 4.0 - 5.6 % Final   HbA1c, POC (prediabetic range)  Date/Time Value Ref Range Status  01/12/2022 02:44 PM 7.2 (A) 5.7 - 6.4 % Final  10/12/2021 03:18 PM 9.9 (A) 5.7 - 6.4 % Final   HbA1c, POC (controlled diabetic range)  Date/Time Value Ref Range Status  01/12/2023 03:04 PM 10.1 (A) 0.0 - 7.0 % Final  01/12/2022 02:44 PM 7.2 (A) 0.0 -  7.0 % Final   HbA1c POC (<> result, manual entry)  Date/Time Value Ref Range Status  01/12/2022 02:44 PM 7.2 4.0 - 5.6 % Final   Hgb A1c MFr Bld  Date/Time Value Ref Range Status  08/31/2022 01:30 PM 8.6 (H) 4.8 - 5.6 % Final    Comment:    (NOTE)         Prediabetes: 5.7 - 6.4         Diabetes: >6.4         Glycemic control for adults with diabetes: <7.0     CBG: Recent Labs  Lab 01/01/24 1356  GLUCAP 410*    Review of Systems:   See HPI   Past Medical History:  He,  has a past medical history of Arthritis, Chronic back pain, Diabetes mellitus without complication (HCC), GERD (gastroesophageal reflux disease), HLD (hyperlipidemia), Hypertension, and Vitamin D  deficiency.   Surgical History:   Past Surgical History:  Procedure Laterality Date   ANTERIOR CERVICAL DECOMP/DISCECTOMY FUSION N/A 09/05/2022   Procedure: Anterior Cervical Decompression Fusion  - Cervical three-Cervical four;  Surgeon: Louis Shove, MD;  Location: [facility] OR;  Service: Neurosurgery;  Laterality: N/A;   CERVICAL DISC SURGERY   99   LUMBAR FUSION     LUMBAR LAMINECTOMY/DECOMPRESSION MICRODISCECTOMY Bilateral 03/09/2021   Procedure: Laminectomy and Foraminotomy - bilateral - Lumbar two-Lumbar three;  Surgeon: Louis Shove, MD;  Location: [facility] OR;  Service: Neurosurgery;  Laterality: Bilateral;   ROTATOR CUFF REPAIR Bilateral    SHOULDER ARTHROSCOPY WITH ROTATOR CUFF REPAIR AND SUBACROMIAL DECOMPRESSION Right 12/30/2018   Procedure: SHOULDER ARTHROSCOPY WITH ROTATOR CUFF REPAIR AND SUBACROMIAL DECOMPRESSION, DISTAL CLAVICLE EXICISION,;  Surgeon: Cristy Bonner DASEN, MD;  Location: [facility];  Service: Orthopedics;  Laterality: Right;   SHOULDER ARTHROSCOPY WITH SUBACROMIAL DECOMPRESSION, ROTATOR CUFF REPAIR AND BICEP TENDON REPAIR Left 06/19/2019   Procedure: LEFT SHOULDER ARTHROSCOPY, EXTENTSIVE DEBRIDEMENT, DISTAL CLAVICULECTOMY, SUBACROMIAL DECOMPRESSION, PARTIAL ACROMIOPLASTY WITH CORACOACROMIAL RELEASE, ROTATOR CUFFF REPAIR AND BICEP TENODESIS;  Surgeon: Cristy Bonner DASEN, MD;  Location: [facility];  Service: Orthopedics;  Laterality: Left;     Social History:   reports that he quit smoking about 3 years ago. His smoking use included cigarettes. He started smoking about 28 years ago. He has a 6.3 pack-year smoking history. He has never used smokeless tobacco. He reports current alcohol use of about 4.0 standard drinks of alcohol per week. He reports that he does not use drugs.   Family History:  His family history includes Diabetes in his mother; Heart disease in his mother. There is no history of Amblyopia, Blindness, Cataracts, Hypertension, Macular degeneration, Retinal detachment, Strabismus, Esophageal cancer, Colon cancer, Pancreatic cancer, Ulcerative colitis, Inflammatory bowel disease, Liver disease, Rectal cancer, or Stomach cancer.   Allergies Allergies  Allergen Reactions   Cymbalta [Duloxetine Hcl] Nausea And Vomiting and Other (See Comments)    Dizziness/headaches   Shrimp [Shellfish  Allergy]     intolerant   Valium  [Diazepam ] Itching     Home Medications  Prior to Admission medications   Medication Sig Start Date End Date Taking? Authorizing Provider  Blood Glucose Monitoring Suppl DEVI 1 each by Does not apply route in the morning and at bedtime. May substitute to any manufacturer covered by patient's insurance. 02/27/23   [name], NP  cyclobenzaprine  (FLEXERIL ) 10 MG tablet TAKE 1 TABLET(10 MG) BY MOUTH THREE TIMES DAILY AS NEEDED FOR MUSCLE SPASMS 11/06/23   [name], NP  empagliflozin  (JARDIANCE ) 25  MG TABS tablet Take 1 tablet (25 mg total) by mouth daily. Patient not taking: Reported on 12/27/2023 03/14/23   [name], NP  esomeprazole  (NEXIUM ) 40 MG capsule TAKE 1 CAPSULE BY MOUTH EVERY MORNING BEFORE BREAKFAST 10/15/23   [name], NP  gabapentin  (NEURONTIN ) 300 MG capsule Take 1 capsule (300 mg total) by mouth 3 (three) times daily. 04/20/23   Nichols, [name] S, NP  hydrocortisone  (ANUSOL -HC) 2.5 % rectal cream Place 1 Application rectally 2 (two) times daily. Use for 10 days 01/29/23   Kerman Vina HERO, NP  lisinopril -hydrochlorothiazide  (ZESTORETIC ) 20-25 MG tablet TAKE 1 TABLET BY MOUTH DAILY 10/02/23 10/01/24  Nichols, [name] S, NP  metFORMIN  (GLUCOPHAGE ) 1000 MG tablet TAKE 1 TABLET(1000 MG) BY MOUTH TWICE DAILY WITH A MEAL Patient not taking: Reported on 12/27/2023 10/15/23   [name], NP  oxyCODONE -acetaminophen  (PERCOCET) 10-325 MG tablet Take 1 tablet by mouth 4 (four) times daily as needed for pain. 08/11/22   [provider]  simvastatin  (ZOCOR ) 10 MG tablet TAKE 1 TABLET(10 MG) BY MOUTH DAILY 10/08/23   Nichols, [name] S, NP  tirzepatide  (MOUNJARO ) 7.5 MG/0.5ML Pen Inject 7.5 mg into the skin once a week. Patient not taking: Reported on 12/27/2023 11/14/23   [name], NP     Critical care time: 50 mins     [facility] Pulmonary & Critical Care 01/01/2024, 3:42 PM  Please see [email or web address]  for pager details.  From 7A-7P if no response, please call [phone]. After hours, please call ELink [phone].

## 2024-01-01 NOTE — Progress Notes (Signed)
 Brief Nephrology note. Patient seen earlier today by Dr. Tobie. Severe acidosis in renal failure. Lactic acid very high. Acidosis mostly related to hemodynamic compromise however it is multifactorial. Given severe nature and possibility of metformin  induced lactic acidosis it felt he would benefit from renal replacement therapy. Given his hemodynamic compromise will start CRRT tonight. Continue bicarb infusion.

## 2024-01-01 NOTE — ED Triage Notes (Signed)
 Pt bibems from home. N/V/D x 7 days, unable to eat or drink much. Skin cool and clammy. Start Mounjaro  last Monday, pt believes is the cause of the pain. 22G R hand. Complains of aching abdominal pain 10/10, pain all over. 500mL NS given, has LR running.  BP 82/60 RR 36 HR 102 O2 99%  CBG 404

## 2024-01-01 NOTE — Progress Notes (Addendum)
 eLink Physician-Brief Progress Note Patient Name: DEQUANTE TREMAINE DOB: 12/21/1955 MRN: [id number]   Date of Service  01/01/2024  HPI/Events of Note  ABG reviewed, consistent with profound metabolic acidosis.  eICU Interventions  Bicarb 2 amps iv stat, Bicarb gtt increased to 150 ml / hour, stat consult to nephrology for emergent dialysis.        Doyce Stonehouse U Averey Koning 01/01/2024, 10:20 PM

## 2024-01-01 NOTE — Progress Notes (Signed)
 eLink Physician-Brief Progress Note Patient Name: Riley Clarke DOB: 1955/06/30 MRN: [id number]   Date of Service  01/01/2024  HPI/Events of Note  DKA, AKI, atrial fib with RVR, and hypotension in the context of recently starting Mounjaro . CO2 7, K+ 5.0, Cr 12.  eICU Interventions  IV fluids switched to D5 bicarb at 125 ml / hour, Q 4 hourly BMP, Amiodarone  ordered for atrial fib with RVR, Peripheral Phenylephrine  ordered for hypotension.        Marcellina RAYMOND Dub 01/01/2024, 8:19 PM

## 2024-01-01 NOTE — Progress Notes (Signed)
 ED Pharmacy Antibiotic Sign Off An antibiotic consult was received from an ED provider for vancomycin  and zosyn  per pharmacy dosing for sepsis. A chart review was completed to assess appropriateness.  The following one time order(s) were placed per pharmacy consult:  zosyn  2.25g x 1 dose vancomycin  2000 mg x 1 dose  Further antibiotic and/or antibiotic pharmacy consults should be ordered by the admitting provider if indicated.   Thank you for allowing pharmacy to be a part of this patient's care.   Dorn Buttner, PharmD, BCPS 01/01/2024 3:07 PM ED Clinical Pharmacist -  [phone]

## 2024-01-01 NOTE — Progress Notes (Signed)
 eLink Physician-Brief Progress Note Patient Name: Riley Clarke DOB: 1955/06/22 MRN: [id number]   Date of Service  01/01/2024  HPI/Events of Note  Bp 73/53, Peripheral Neo does not appear to be working.  eICU Interventions  Peripheral Levo gtt ordered, stat ABG to r/o severe acidosis impacting BP, Normal Saline 500 ml iv bolus x 1, echocardiogram.        Riley Clarke 01/01/2024, 9:31 PM

## 2024-01-02 ENCOUNTER — Other Ambulatory Visit (HOSPITAL_COMMUNITY)

## 2024-01-02 ENCOUNTER — Inpatient Hospital Stay (HOSPITAL_COMMUNITY)

## 2024-01-02 DIAGNOSIS — I4891 Unspecified atrial fibrillation: Secondary | ICD-10-CM

## 2024-01-02 DIAGNOSIS — E1110 Type 2 diabetes mellitus with ketoacidosis without coma: Secondary | ICD-10-CM | POA: Diagnosis not present

## 2024-01-02 DIAGNOSIS — E871 Hypo-osmolality and hyponatremia: Secondary | ICD-10-CM | POA: Diagnosis not present

## 2024-01-02 DIAGNOSIS — A419 Sepsis, unspecified organism: Secondary | ICD-10-CM

## 2024-01-02 DIAGNOSIS — N179 Acute kidney failure, unspecified: Secondary | ICD-10-CM | POA: Diagnosis not present

## 2024-01-02 LAB — GASTROINTESTINAL PANEL BY PCR, STOOL (REPLACES STOOL CULTURE)
Adenovirus F40/41: NOT DETECTED
Astrovirus: NOT DETECTED
Campylobacter species: NOT DETECTED
Cryptosporidium: NOT DETECTED
Cyclospora cayetanensis: NOT DETECTED
Entamoeba histolytica: NOT DETECTED
Enteroaggregative E coli (EAEC): NOT DETECTED
Enteropathogenic E coli (EPEC): DETECTED — AB
Enterotoxigenic E coli (ETEC): DETECTED — AB
Giardia lamblia: NOT DETECTED
Norovirus GI/GII: NOT DETECTED
Plesimonas shigelloides: NOT DETECTED
Rotavirus A: NOT DETECTED
Salmonella species: DETECTED — AB
Sapovirus (I, II, IV, and V): NOT DETECTED
Shiga like toxin producing E coli (STEC): NOT DETECTED
Shigella/Enteroinvasive E coli (EIEC): NOT DETECTED
Vibrio cholerae: NOT DETECTED
Vibrio species: NOT DETECTED
Yersinia enterocolitica: NOT DETECTED

## 2024-01-02 LAB — GLUCOSE, CAPILLARY
Glucose-Capillary: 101 mg/dL — ABNORMAL HIGH (ref 70–99)
Glucose-Capillary: 104 mg/dL — ABNORMAL HIGH (ref 70–99)
Glucose-Capillary: 108 mg/dL — ABNORMAL HIGH (ref 70–99)
Glucose-Capillary: 130 mg/dL — ABNORMAL HIGH (ref 70–99)
Glucose-Capillary: 136 mg/dL — ABNORMAL HIGH (ref 70–99)
Glucose-Capillary: 143 mg/dL — ABNORMAL HIGH (ref 70–99)
Glucose-Capillary: 145 mg/dL — ABNORMAL HIGH (ref 70–99)
Glucose-Capillary: 147 mg/dL — ABNORMAL HIGH (ref 70–99)
Glucose-Capillary: 160 mg/dL — ABNORMAL HIGH (ref 70–99)
Glucose-Capillary: 226 mg/dL — ABNORMAL HIGH (ref 70–99)
Glucose-Capillary: 259 mg/dL — ABNORMAL HIGH (ref 70–99)
Glucose-Capillary: 265 mg/dL — ABNORMAL HIGH (ref 70–99)
Glucose-Capillary: 89 mg/dL (ref 70–99)

## 2024-01-02 LAB — BASIC METABOLIC PANEL WITH GFR
Anion gap: 34 — ABNORMAL HIGH (ref 5–15)
BUN: 83 mg/dL — ABNORMAL HIGH (ref 8–23)
BUN: 60 mg/dL — ABNORMAL HIGH (ref 8–23)
CO2: <7 mmol/L — ABNORMAL LOW (ref 22–32)
CO2: 8 mmol/L — ABNORMAL LOW (ref 22–32)
Calcium: 7.2 mg/dL — ABNORMAL LOW (ref 8.9–10.3)
Calcium: 6.9 mg/dL — ABNORMAL LOW (ref 8.9–10.3)
Chloride: 91 mmol/L — ABNORMAL LOW (ref 98–111)
Chloride: 93 mmol/L — ABNORMAL LOW (ref 98–111)
Creatinine, Ser: 10.6 mg/dL — ABNORMAL HIGH (ref 0.61–1.24)
Creatinine, Ser: 7.09 mg/dL — ABNORMAL HIGH (ref 0.61–1.24)
GFR, Estimated: 5 mL/min — ABNORMAL LOW (ref >60)
GFR, Estimated: 8 mL/min — ABNORMAL LOW (ref >60)
Glucose, Bld: 192 mg/dL — ABNORMAL HIGH (ref 70–99)
Glucose, Bld: 154 mg/dL — ABNORMAL HIGH (ref 70–99)
Potassium: 3.7 mmol/L (ref 3.5–5.1)
Potassium: 3.8 mmol/L (ref 3.5–5.1)
Sodium: 132 mmol/L — ABNORMAL LOW (ref 135–145)
Sodium: 135 mmol/L (ref 135–145)

## 2024-01-02 LAB — CBC WITH DIFFERENTIAL/PLATELET
Abs Immature Granulocytes: 0 K/uL (ref 0.00–0.07)
Basophils Absolute: 0 K/uL (ref 0.0–0.1)
Basophils Relative: 0 %
Eosinophils Absolute: 0 K/uL (ref 0.0–0.5)
Eosinophils Relative: 0 %
HCT: 39.9 % (ref 39.0–52.0)
Hemoglobin: 13 g/dL (ref 13.0–17.0)
Lymphocytes Relative: 11 %
Lymphs Abs: 1 K/uL (ref 0.7–4.0)
MCH: 24.2 pg — ABNORMAL LOW (ref 26.0–34.0)
MCHC: 32.6 g/dL (ref 30.0–36.0)
MCV: 74.2 fL — ABNORMAL LOW (ref 80.0–100.0)
Monocytes Absolute: 0.3 K/uL (ref 0.1–1.0)
Monocytes Relative: 3 %
Neutro Abs: 7.7 K/uL (ref 1.7–7.7)
Neutrophils Relative %: 86 %
Platelets: 259 K/uL (ref 150–400)
RBC: 5.38 MIL/uL (ref 4.22–5.81)
RDW: 15.9 % — ABNORMAL HIGH (ref 11.5–15.5)
WBC: 8.9 K/uL (ref 4.0–10.5)
nRBC: 0 % (ref 0.0–0.2)
nRBC: 0 /100{WBCs}

## 2024-01-02 LAB — RENAL FUNCTION PANEL
Albumin: 2.4 g/dL — ABNORMAL LOW (ref 3.5–5.0)
Albumin: 2 g/dL — ABNORMAL LOW (ref 3.5–5.0)
Anion gap: 29 — ABNORMAL HIGH (ref 5–15)
BUN: 74 mg/dL — ABNORMAL HIGH (ref 8–23)
BUN: 48 mg/dL — ABNORMAL HIGH (ref 8–23)
CO2: <7 mmol/L — ABNORMAL LOW (ref 22–32)
CO2: 12 mmol/L — ABNORMAL LOW (ref 22–32)
Calcium: 7.6 mg/dL — ABNORMAL LOW (ref 8.9–10.3)
Calcium: 7.2 mg/dL — ABNORMAL LOW (ref 8.9–10.3)
Chloride: 92 mmol/L — ABNORMAL LOW (ref 98–111)
Chloride: 92 mmol/L — ABNORMAL LOW (ref 98–111)
Creatinine, Ser: 9.15 mg/dL — ABNORMAL HIGH (ref 0.61–1.24)
Creatinine, Ser: 5.58 mg/dL — ABNORMAL HIGH (ref 0.61–1.24)
GFR, Estimated: 6 mL/min — ABNORMAL LOW (ref >60)
GFR, Estimated: 11 mL/min — ABNORMAL LOW (ref >60)
Glucose, Bld: 119 mg/dL — ABNORMAL HIGH (ref 70–99)
Glucose, Bld: 202 mg/dL — ABNORMAL HIGH (ref 70–99)
Phosphorus: 10.3 mg/dL — ABNORMAL HIGH (ref 2.5–4.6)
Phosphorus: 5.6 mg/dL — ABNORMAL HIGH (ref 2.5–4.6)
Potassium: 3.7 mmol/L (ref 3.5–5.1)
Potassium: 3.5 mmol/L (ref 3.5–5.1)
Sodium: 134 mmol/L — ABNORMAL LOW (ref 135–145)
Sodium: 135 mmol/L (ref 135–145)

## 2024-01-02 LAB — C DIFFICILE QUICK SCREEN W PCR REFLEX
C Diff antigen: NEGATIVE
C Diff interpretation: NOT DETECTED
C Diff toxin: NEGATIVE

## 2024-01-02 LAB — BLOOD GAS, ARTERIAL
Acid-base deficit: 16.1 mmol/L — ABNORMAL HIGH (ref 0.0–2.0)
Bicarbonate: 6.5 mmol/L — ABNORMAL LOW (ref 20.0–28.0)
Drawn by: 441371
O2 Saturation: 99.2 %
Patient temperature: 37.1
pCO2 arterial: <18 mmHg — CL (ref 32–48)
pH, Arterial: 7.34 — ABNORMAL LOW (ref 7.35–7.45)
pO2, Arterial: 115 mmHg — ABNORMAL HIGH (ref 83–108)

## 2024-01-02 LAB — URINALYSIS, W/ REFLEX TO CULTURE (INFECTION SUSPECTED)
Bilirubin Urine: NEGATIVE
Glucose, UA: >=500 mg/dL — AB
Ketones, ur: NEGATIVE mg/dL
Leukocytes,Ua: NEGATIVE
Nitrite: NEGATIVE
Protein, ur: 100 mg/dL — AB
Specific Gravity, Urine: 1.012 (ref 1.005–1.030)
pH: 5 (ref 5.0–8.0)

## 2024-01-02 LAB — BETA-HYDROXYBUTYRIC ACID
Beta-Hydroxybutyric Acid: 2.61 mmol/L — ABNORMAL HIGH (ref 0.05–0.27)
Beta-Hydroxybutyric Acid: 2.44 mmol/L — ABNORMAL HIGH (ref 0.05–0.27)

## 2024-01-02 LAB — BLOOD GAS, VENOUS
Acid-base deficit: 17.6 mmol/L — ABNORMAL HIGH (ref 0.0–2.0)
Bicarbonate: 9 mmol/L — ABNORMAL LOW (ref 20.0–28.0)
O2 Saturation: 31.3 %
Patient temperature: 37.2
pCO2, Ven: 24 mmHg — ABNORMAL LOW (ref 44–60)
pH, Ven: 7.18 — CL (ref 7.25–7.43)
pO2, Ven: <31 mmHg — CL (ref 32–45)

## 2024-01-02 LAB — ECHOCARDIOGRAM COMPLETE
Area-P 1/2: 3.37 cm2
Height: 72 in
S' Lateral: 2.5 cm
Weight: 3654.34 [oz_av]

## 2024-01-02 LAB — HEMOGLOBIN AND HEMATOCRIT, BLOOD
HCT: 34.9 % — ABNORMAL LOW (ref 39.0–52.0)
HCT: 35.6 % — ABNORMAL LOW (ref 39.0–52.0)
HCT: 34.4 % — ABNORMAL LOW (ref 39.0–52.0)
Hemoglobin: 11.6 g/dL — ABNORMAL LOW (ref 13.0–17.0)
Hemoglobin: 11.9 g/dL — ABNORMAL LOW (ref 13.0–17.0)
Hemoglobin: 11.9 g/dL — ABNORMAL LOW (ref 13.0–17.0)

## 2024-01-02 LAB — MAGNESIUM: Magnesium: 2.3 mg/dL (ref 1.7–2.4)

## 2024-01-02 LAB — OCCULT BLOOD X 1 CARD TO LAB, STOOL: Fecal Occult Bld: POSITIVE — AB

## 2024-01-02 LAB — APTT: aPTT: 93 s — ABNORMAL HIGH (ref 24–36)

## 2024-01-02 LAB — HEPARIN LEVEL (UNFRACTIONATED): Heparin Unfractionated: 0.7 [IU]/mL (ref 0.30–0.70)

## 2024-01-02 MED ORDER — DEXTROSE IN LACTATED RINGERS 5 % IV SOLN: INTRAVENOUS | Status: DC

## 2024-01-02 MED ORDER — PROSOURCE PLUS PO LIQD
30.0000 mL | Freq: Three times a day (TID) | ORAL | Status: DC
  Administered 2024-01-02 – 2024-01-07 (×12): 30 mL via ORAL
  Filled 2024-01-02 (×12): qty 30

## 2024-01-02 MED ORDER — SODIUM CHLORIDE 0.9 % IV SOLN
2.0000 g | Freq: Every day | INTRAVENOUS | Status: DC
  Administered 2024-01-02 – 2024-01-10 (×9): 2 g via INTRAVENOUS
  Filled 2024-01-02 (×9): qty 20

## 2024-01-02 MED ORDER — SODIUM BICARBONATE 8.4 % IV SOLN
100.0000 meq | Freq: Once | INTRAVENOUS | Status: AC
  Administered 2024-01-02: 100 meq via INTRAVENOUS
  Filled 2024-01-02: qty 100

## 2024-01-02 MED ORDER — SODIUM CHLORIDE 0.9 % IV SOLN: INTRAVENOUS | Status: DC

## 2024-01-02 MED ORDER — HEPARIN (PORCINE) 25000 UT/250ML-% IV SOLN
1200.0000 [IU]/h | INTRAVENOUS | Status: DC
  Administered 2024-01-02 – 2024-01-03 (×2): 1400 [IU]/h via INTRAVENOUS
  Administered 2024-01-04: 1200 [IU]/h via INTRAVENOUS
  Filled 2024-01-02 (×3): qty 250

## 2024-01-02 MED ORDER — SODIUM CHLORIDE 0.9 % IV SOLN
500.0000 [IU]/h | INTRAVENOUS | Status: DC
  Administered 2024-01-02: 500 [IU]/h via INTRAVENOUS_CENTRAL
  Filled 2024-01-02: qty 10000

## 2024-01-02 MED ORDER — STERILE WATER FOR INJECTION IV SOLN
INTRAVENOUS | Status: DC
  Filled 2024-01-02: qty 150
  Filled 2024-01-02 (×2): qty 1000

## 2024-01-02 MED ORDER — PERFLUTREN LIPID MICROSPHERE
1.0000 mL | INTRAVENOUS | Status: AC | PRN
  Administered 2024-01-02: 2 mL via INTRAVENOUS

## 2024-01-02 MED ORDER — INSULIN ASPART 100 UNIT/ML IJ SOLN
0.0000 [IU] | Freq: Three times a day (TID) | INTRAMUSCULAR | Status: DC
  Administered 2024-01-02: 2 [IU] via SUBCUTANEOUS
  Administered 2024-01-03: 3 [IU] via SUBCUTANEOUS

## 2024-01-02 MED ORDER — BOOST / RESOURCE BREEZE PO LIQD CUSTOM
1.0000 | Freq: Three times a day (TID) | ORAL | Status: DC
  Administered 2024-01-02 – 2024-01-07 (×11): 1 via ORAL

## 2024-01-02 MED ORDER — INSULIN GLARGINE-YFGN 100 UNIT/ML ~~LOC~~ SOLN
30.0000 [IU] | Freq: Every day | SUBCUTANEOUS | Status: DC
  Filled 2024-01-02: qty 0.3

## 2024-01-02 MED ORDER — INSULIN GLARGINE-YFGN 100 UNIT/ML ~~LOC~~ SOLN
12.0000 [IU] | Freq: Every day | SUBCUTANEOUS | Status: DC
  Administered 2024-01-02: 12 [IU] via SUBCUTANEOUS
  Filled 2024-01-02 (×2): qty 0.12

## 2024-01-02 MED ORDER — RENA-VITE PO TABS
1.0000 | ORAL_TABLET | Freq: Every day | ORAL | Status: DC
  Administered 2024-01-02 – 2024-01-06 (×5): 1 via ORAL
  Filled 2024-01-02 (×5): qty 1

## 2024-01-02 NOTE — Progress Notes (Signed)
 PHARMACY - ANTICOAGULATION CONSULT NOTE  Pharmacy Consult for heparin  Indication: atrial fibrillation  Allergies  Allergen Reactions   Cymbalta [Duloxetine Hcl] Nausea And Vomiting and Other (See Comments)    Dizziness/headaches   Shrimp [Shellfish Allergy]     intolerant   Valium  [Diazepam ] Itching    Patient Measurements: Height: 6' (182.9 cm) Weight: 103.6 kg (228 lb 6.3 oz) IBW/kg (Calculated) : 77.6 HEPARIN  DW (KG): 99.3  Vital Signs: Temp: 99.3 F (37.4 C) (07/23 2030) Temp Source: Bladder (07/23 2000) BP: 119/48 (07/23 2030) Pulse Rate: 75 (07/23 2030)  Labs: Recent Labs    01/01/24 1410 01/01/24 1440 01/01/24 2041 01/01/24 2043 01/02/24 0451 01/02/24 0452 01/02/24 0649 01/02/24 1055 01/02/24 1429 01/02/24 1553 01/02/24 2049  HGB 12.7*   < > 12.5*  --  13.0  --  11.6* 11.9* 11.9*  --   --   HCT 39.7   < > 40.1  --  39.9  --  34.9* 35.6* 34.4*  --   --   PLT 406*  --  381  --  259  --   --   --   --   --   --   APTT  --   --   --   --   --   --   --   --   --   --  93*  LABPROT 17.3*  --   --   --   --   --   --   --   --   --   --   INR 1.3*  --   --   --   --   --   --   --   --   --   --   HEPARINUNFRC  --   --   --   --   --   --   --   --   --   --  0.70  CREATININE 12.21*   < >  --    < >  --  9.15*  --  7.09*  --  5.58*  --    < > = values in this interval not displayed.    Estimated Creatinine Clearance: 16.2 mL/min (A) (by C-G formula based on SCr of 5.58 mg/dL (H)).   Medical History: Past Medical History:  Diagnosis Date   Arthritis    Chronic back pain    on chronic opioids   Diabetes mellitus without complication (HCC)    GERD (gastroesophageal reflux disease)    HLD (hyperlipidemia)    Hypertension    no meds   Vitamin D  deficiency     Assessment: 67 yoM with new onset atrial fibrillation in the ICU. No oral anticoagulation prior to admission. Pharmacy consulted to manage heparin  infusion. Patient started on CRRT overnight and  heparin  added this morning. RN will stop heparin  with CRRT once systemic heparin  is initiated. Noted Fecal occult (+), care team aware and felt this was likely due to his GI illness and diarrhea.  Heparin  drip 1400 uts/hr with aptt 93sec and heparin  level 0.7 at upper end of goal will decrease slightly.   Goal of Therapy:  Heparin  level 0.3-0.7 units/ml Monitor platelets by anticoagulation protocol: Yes   Plan:  Decrease heparin  infusion at 1300 units/hr Daily cbc and heparin  level  Continue to monitor H&H and platelets   Olam Chalk Pharm.D. CPP, BCPS Clinical Pharmacist [phone] 01/02/2024 10:16 PM   Please check AMION for all [facility] Pharmacy  numbers

## 2024-01-02 NOTE — TOC Initial Note (Signed)
 Transition of Care [facility]) - Initial/Assessment Note    Patient Details  Name: Riley Clarke MRN: [id number] Date of Birth: [birth year]  [facility] CM/SW Contact:    Andrez JULIANNA George, RN Phone Number: 01/02/2024, 3:22 PM  Clinical Narrative:                  Pt on CRRT.  He is from home alone. He has sister, brother and niece that can check on him.  Pt drives self.  He manages his own medications and denies issues with medications normally. IP Care Management following.  Expected Discharge Plan:  (TBD) Barriers to Discharge: Continued Medical Work up   Patient Goals and CMS Choice            Expected Discharge Plan and Services                                              Prior Living Arrangements/Services   Lives with:: Self Patient language and need for interpreter reviewed:: Yes Do you feel safe going back to the place where you live?: Yes          Current home services: DME (cane) Criminal Activity/Legal Involvement Pertinent to Current Situation/Hospitalization: No - Comment as needed  Activities of Daily Living      Permission Sought/Granted                  Emotional Assessment Appearance:: Appears stated age Attitude/Demeanor/Rapport: Lethargic Affect (typically observed): Quiet Orientation: : Oriented to Self, Oriented to Place, Oriented to  Time, Oriented to Situation   Psych Involvement: No (comment)  Admission diagnosis:  DKA (diabetic ketoacidosis) (HCC) [E11.10] Patient Active Problem List   Diagnosis Date Noted   Sepsis (HCC) 01/02/2024   DKA (diabetic ketoacidosis) (HCC) 01/01/2024   AKI (acute kidney injury) (HCC) 01/01/2024   Chronic thoracic back pain 10/08/2023   Colon cancer screening 01/12/2023   Hypertension due to endocrine disorder 10/11/2022   Cervical spondylosis with myelopathy and radiculopathy 09/05/2022   Lumbar stenosis with neurogenic claudication 03/09/2021   Bloating symptom  10/07/2020   Diastasis recti 10/07/2020   Nocturia 10/07/2020   Abdominal distension 08/24/2020   Body mass index (BMI) 30.0-30.9, adult 09/16/2019   Elevated blood-pressure reading, without diagnosis of hypertension 09/16/2019   Cervical radiculitis 02/21/2016   Opioid dependence (HCC) 02/21/2016   Displacement of lumbar intervertebral disc without myelopathy 04/30/2013   Diabetes (HCC) 09/12/2012   Spinal stenosis of lumbar region 09/10/2012   PCP:  [name], NP Pharmacy:   [id number] - [place], [place] - [address] [facility] [address] [facility] [address] & [address] [place] [address] Phone: [phone] Fax: [phone]     Social Drivers of Health (SDOH) Social History: SDOH Screenings   Food Insecurity: Patient Declined (12/25/2023)  Housing: Unknown (12/25/2023)  Transportation Needs: No Transportation Needs (12/25/2023)  Utilities: Not At Risk (08/10/2022)  Alcohol Screen: Low Risk  (12/25/2023)  Depression (PHQ2-9): Low Risk  (12/27/2023)  Financial Resource Strain: Low Risk  (12/25/2023)  Physical Activity: Inactive (12/25/2023)  Social Connections: Unknown (12/25/2023)  Stress: No Stress Concern Present (12/25/2023)  Tobacco Use: Medium Risk (12/27/2023)   SDOH Interventions:     Readmission Risk Interventions     No data to display

## 2024-01-02 NOTE — Progress Notes (Addendum)
 PHARMACY - ANTICOAGULATION CONSULT NOTE  Pharmacy Consult for heparin  Indication: atrial fibrillation  Allergies  Allergen Reactions   Cymbalta [Duloxetine Hcl] Nausea And Vomiting and Other (See Comments)    Dizziness/headaches   Shrimp [Shellfish Allergy]     intolerant   Valium  [Diazepam ] Itching    Patient Measurements: Height: 6' (182.9 cm) Weight: 103.6 kg (228 lb 6.3 oz) IBW/kg (Calculated) : 77.6 HEPARIN  DW (KG): 99.3  Vital Signs: Temp: 98.8 F (37.1 C) (07/23 1045) Temp Source: Bladder (07/23 1000) BP: 116/51 (07/23 1045) Pulse Rate: 77 (07/23 1045)  Labs: Recent Labs    01/01/24 1410 01/01/24 1440 01/01/24 2041 01/01/24 2043 01/01/24 2311 01/02/24 0451 01/02/24 0452 01/02/24 0649  HGB 12.7*   < > 12.5*  --   --  13.0  --  11.6*  HCT 39.7   < > 40.1  --   --  39.9  --  34.9*  PLT 406*  --  381  --   --  259  --   --   LABPROT 17.3*  --   --   --   --   --   --   --   INR 1.3*  --   --   --   --   --   --   --   CREATININE 12.21*   < >  --  11.26* 10.60*  --  9.15*  --    < > = values in this interval not displayed.    Estimated Creatinine Clearance: 9.9 mL/min (A) (by C-G formula based on SCr of 9.15 mg/dL (H)).   Medical History: Past Medical History:  Diagnosis Date   Arthritis    Chronic back pain    on chronic opioids   Diabetes mellitus without complication (HCC)    GERD (gastroesophageal reflux disease)    HLD (hyperlipidemia)    Hypertension    no meds   Vitamin D  deficiency     Assessment: 50 yoM with new onset atrial fibrillation in the ICU. No oral anticoagulation prior to admission. Pharmacy consulted to manage heparin  infusion. Patient started on CRRT overnight and heparin  added this morning. RN will stop heparin  with CRRT once systemic heparin  is initiated. Noted Fecal occult (+), care team aware and felt this was likely due to his GI illness and diarrhea.  Goal of Therapy:  Heparin  level 0.3-0.7 units/ml Monitor platelets by  anticoagulation protocol: Yes   Plan:  Start heparin  infusion at 1400 units/hr Check anti-Xa level in 8 hours and daily while on heparin  Continue to monitor H&H and platelets  Koren Or, PharmD Clinical Pharmacist 01/02/2024 11:16 AM Please check AMION for all [facility] Pharmacy numbers

## 2024-01-02 NOTE — Progress Notes (Addendum)
 NAME:  Riley Clarke, MRN:  [id number], DOB:  [birth year], LOS: 1 ADMISSION DATE:  01/01/2024  History of Present Illness:  Pt is a with significant pmhx of diabetes 2, HLD, HTN, Chronic Back pain, and Arthritis who presents by EMS from home with N/V/D for approxmiately the 7 days and abdominal pain. Notes persistent diarrhea for the last week but just 3 episodes of vomiting. Also decreased to no urine output. No sick contacts. Patient states that patient had a mounjaro  injection last Monday 12/24/23, which he contributes to the abdominal pain. He was also still taking Metformin , lisinopril /hydrochlorothiazide , and Jardiance .   Initial Istat labs revealed- metabolic acidosis- venous pH 6.963, lactic acid of 10.7, creatinine of 12.20, glucose of 395, BUN of 93. CBC 16.4, HCT 39, and platelets 406. Concern for DKA and Renal Failure. Upon initial assessment of patient, patient having kussmaul respirations, tachycardia- HR 100s, BP within normal limits, O2 sat 99% on RA. PCCM consulted for admitted due to metabolic derangements along with Nephro consult.   Pertinent Medical History  Arthritis Chronic back pain on chronic opioids T2DM GERD HLD HTN Vit D Deficiency  Significant Hospital Events: Including procedures, antibiotic start and stop dates in addition to other pertinent events   7/22 admitted to PCCM, CRRT started due to worsening metabolic acidosis   Interim History / Subjective:  Hypotensive overnight despite phenylephrine , peripheral levo gtt added and nephrology consulted for worsening acidosis, CRRT started  Objective    Blood pressure (!) 108/44, pulse 82, temperature 98.4 F (36.9 C), resp. rate 18, height 6' (1.829 m), weight 103.6 kg, SpO2 98%.        Intake/Output Summary (Last 24 hours) at 01/02/2024 0755 Last data filed at 01/02/2024 0700 Gross per 24 hour  Intake 7153.23 ml  Output 1549 ml  Net 5604.23 ml   Filed Weights   01/01/24 1502 01/02/24 0010 01/02/24  0500  Weight: 104.8 kg 103.6 kg 103.6 kg    Examination: General: resting comfortably in bed, no acute distress HENT: dry mucous membranes Lungs: tachypneic, CTAB Cardiovascular: RRR, no m/r/g Abdomen: bowel sounds present, Soft, diffusely ttp, non-distended Extremities: no edema BLE Neuro: A&O x4  Resolved problem list   Assessment and Plan  Metabolic Acidosis Lactic Acidosis DKA, hx T2DM Shock Metabolic acidosis likely secondary to metformin  induced lactic acidosis, AKI, and DKA - Transition to subcutaneous insulin  12U basal, very sensitive SSI - D5LR at 40ml/h while NPO - Sodium bicarb 100ml/h  - levophed , vasopressin , phenylephrine  gtt - wean as tolerated for MAP >65 - monitor BMP - f/u GIPP  Acute Renal Failure Suspected pre-renal secondary to GI losses, ACE-I/thiazide use, Jardiance  use - Nephrology following - CRRT with low dose heparin   Afib with RVR No hx afib - Amiodarone  30mg /h gtt  - F/u echo  Hyponatremia Improving - Monitor BMP - fluid resuscitation   Chronic back pain - tylenol  q6h PRN mild pain - oxycodone  5mg  q6h PRN severe pain - avoid nephrotoxic medications  Best Practice (right click and Reselect all SmartList Selections daily)   Diet/type: NPO DVT prophylaxis prophylactic heparin   Pressure ulcer(s): N/A GI prophylaxis: N/A Lines: Dialysis Catheter Foley:  Yes, and it is still needed Code Status:  full code Last date of multidisciplinary goals of care discussion []   Labs   CBC: Recent Labs  Lab 01/01/24 1410 01/01/24 1440 01/01/24 2041 01/02/24 0451 01/02/24 0649  WBC 16.4*  --  13.8* 8.9  --   NEUTROABS 12.5*  --   --  7.7  --   HGB 12.7* 13.3  14.3 12.5* 13.0 11.6*  HCT 39.7 39.0  42.0 40.1 39.9 34.9*  MCV 76.8*  --  78.2* 74.2*  --   PLT 406*  --  381 259  --     Basic Metabolic Panel: Recent Labs  Lab 01/01/24 1410 01/01/24 1440 01/01/24 2043 01/01/24 2311 01/02/24 0451 01/02/24 0452  NA 127* 121*   121* 131* 132*  --  134*  K 5.0 4.6  4.7 4.0 3.7  --  3.7  CL 83* 91* 91* 91*  --  92*  CO2 <7*  --  <7* <7*  --  <7*  GLUCOSE 393* 395* 222* 192*  --  119*  BUN 85* 93* 84* 83*  --  74*  CREATININE 12.21* 12.20* 11.26* 10.60*  --  9.15*  CALCIUM  7.6*  --  7.7* 7.2*  --  7.6*  MG  --   --  2.9*  --  2.3  --   PHOS  --   --  >30.0*  --   --  10.3*   GFR: Estimated Creatinine Clearance: 9.9 mL/min (A) (by C-G formula based on SCr of 9.15 mg/dL (H)). Recent Labs  Lab 01/01/24 1410 01/01/24 1440 01/01/24 2041 01/01/24 2043 01/02/24 0451  WBC 16.4*  --  13.8*  --  8.9  LATICACIDVEN  --  10.7*  --  >9.0*  --     Liver Function Tests: Recent Labs  Lab 01/01/24 1410 01/01/24 2043 01/02/24 0452  AST 34 39  --   ALT 21 23  --   ALKPHOS 117 129*  --   BILITOT 0.5 0.7  --   PROT 6.4* 6.1*  --   ALBUMIN 2.5* 2.4* 2.4*   Recent Labs  Lab 01/01/24 1410  LIPASE 69*   No results for input(s): AMMONIA in the last 168 hours.  ABG    Component Value Date/Time   PHART 6.97 (LL) 01/01/2024 2145   PCO2ART <18 (LL) 01/01/2024 2145   PO2ART 190 (H) 01/01/2024 2145   HCO3 9.0 (L) 01/02/2024 0451   TCO2 <5 (L) 01/01/2024 1440   TCO2 7 (L) 01/01/2024 1440   ACIDBASEDEF 17.6 (H) 01/02/2024 0451   O2SAT 31.3 01/02/2024 0451     Coagulation Profile: Recent Labs  Lab 01/01/24 1410  INR 1.3*    Cardiac Enzymes: No results for input(s): CKTOTAL, CKMB, CKMBINDEX, TROPONINI in the last 168 hours.  HbA1C: Hemoglobin A1C  Date/Time Value Ref Range Status  10/15/2023 02:34 PM 10.3 (A) 4.0 - 5.6 % Final  04/16/2023 03:43 PM 8.1 (A) 4.0 - 5.6 % Final   HbA1c, POC (prediabetic range)  Date/Time Value Ref Range Status  01/12/2022 02:44 PM 7.2 (A) 5.7 - 6.4 % Final  10/12/2021 03:18 PM 9.9 (A) 5.7 - 6.4 % Final   HbA1c, POC (controlled diabetic range)  Date/Time Value Ref Range Status  01/12/2023 03:04 PM 10.1 (A) 0.0 - 7.0 % Final  01/12/2022 02:44 PM 7.2 (A) 0.0 -  7.0 % Final   HbA1c POC (<> result, manual entry)  Date/Time Value Ref Range Status  01/12/2022 02:44 PM 7.2 4.0 - 5.6 % Final   Hgb A1c MFr Bld  Date/Time Value Ref Range Status  08/31/2022 01:30 PM 8.6 (H) 4.8 - 5.6 % Final    Comment:    (NOTE)         Prediabetes: 5.7 - 6.4         Diabetes: >6.4  Glycemic control for adults with diabetes: <7.0     CBG: Recent Labs  Lab 01/02/24 0154 01/02/24 0253 01/02/24 0418 01/02/24 0644 01/02/24 0716  GLUCAP 136* 130* 108* 89 101*    Review of Systems:   na  Past Medical History:  He,  has a past medical history of Arthritis, Chronic back pain, Diabetes mellitus without complication (HCC), GERD (gastroesophageal reflux disease), HLD (hyperlipidemia), Hypertension, and Vitamin D  deficiency.   Surgical History:   Past Surgical History:  Procedure Laterality Date   ANTERIOR CERVICAL DECOMP/DISCECTOMY FUSION N/A 09/05/2022   Procedure: Anterior Cervical Decompression Fusion  - Cervical three-Cervical four;  Surgeon: Louis Shove, MD;  Location: [facility] OR;  Service: Neurosurgery;  Laterality: N/A;   CERVICAL DISC SURGERY  99   LUMBAR FUSION     LUMBAR LAMINECTOMY/DECOMPRESSION MICRODISCECTOMY Bilateral 03/09/2021   Procedure: Laminectomy and Foraminotomy - bilateral - Lumbar two-Lumbar three;  Surgeon: Louis Shove, MD;  Location: [facility] OR;  Service: Neurosurgery;  Laterality: Bilateral;   ROTATOR CUFF REPAIR Bilateral    SHOULDER ARTHROSCOPY WITH ROTATOR CUFF REPAIR AND SUBACROMIAL DECOMPRESSION Right 12/30/2018   Procedure: SHOULDER ARTHROSCOPY WITH ROTATOR CUFF REPAIR AND SUBACROMIAL DECOMPRESSION, DISTAL CLAVICLE EXICISION,;  Surgeon: Cristy Bonner DASEN, MD;  Location: [facility];  Service: Orthopedics;  Laterality: Right;   SHOULDER ARTHROSCOPY WITH SUBACROMIAL DECOMPRESSION, ROTATOR CUFF REPAIR AND BICEP TENDON REPAIR Left 06/19/2019   Procedure: LEFT SHOULDER ARTHROSCOPY, EXTENTSIVE DEBRIDEMENT, DISTAL CLAVICULECTOMY,  SUBACROMIAL DECOMPRESSION, PARTIAL ACROMIOPLASTY WITH CORACOACROMIAL RELEASE, ROTATOR CUFFF REPAIR AND BICEP TENODESIS;  Surgeon: Cristy Bonner DASEN, MD;  Location: [facility];  Service: Orthopedics;  Laterality: Left;     Social History:   reports that he quit smoking about 3 years ago. His smoking use included cigarettes. He started smoking about 28 years ago. He has a 6.3 pack-year smoking history. He has never used smokeless tobacco. He reports current alcohol use of about 4.0 standard drinks of alcohol per week. He reports that he does not use drugs.   Family History:  His family history includes Diabetes in his mother; Heart disease in his mother. There is no history of Amblyopia, Blindness, Cataracts, Hypertension, Macular degeneration, Retinal detachment, Strabismus, Esophageal cancer, Colon cancer, Pancreatic cancer, Ulcerative colitis, Inflammatory bowel disease, Liver disease, Rectal cancer, or Stomach cancer.   Allergies Allergies  Allergen Reactions   Cymbalta [Duloxetine Hcl] Nausea And Vomiting and Other (See Comments)    Dizziness/headaches   Shrimp [Shellfish Allergy]     intolerant   Valium  [Diazepam ] Itching     Home Medications  Prior to Admission medications   Medication Sig Start Date End Date Taking? Authorizing Provider  Blood Glucose Monitoring Suppl DEVI 1 each by Does not apply route in the morning and at bedtime. May substitute to any manufacturer covered by patient's insurance. 02/27/23   [name], NP  cyclobenzaprine  (FLEXERIL ) 10 MG tablet TAKE 1 TABLET(10 MG) BY MOUTH THREE TIMES DAILY AS NEEDED FOR MUSCLE SPASMS 11/06/23   [name], NP  empagliflozin  (JARDIANCE ) 25 MG TABS tablet Take 1 tablet (25 mg total) by mouth daily. Patient not taking: Reported on 12/27/2023 03/14/23   [name], NP  esomeprazole  (NEXIUM ) 40 MG capsule TAKE 1 CAPSULE BY MOUTH EVERY MORNING BEFORE BREAKFAST 10/15/23   [name], NP  gabapentin   (NEURONTIN ) 300 MG capsule Take 1 capsule (300 mg total) by mouth 3 (three) times daily. 04/20/23   [name], NP  hydrocortisone  (ANUSOL -HC) 2.5 % rectal  cream Place 1 Application rectally 2 (two) times daily. Use for 10 days 01/29/23   Kerman Vina HERO, NP  lisinopril -hydrochlorothiazide  (ZESTORETIC ) 20-25 MG tablet TAKE 1 TABLET BY MOUTH DAILY 10/02/23 10/01/24  Nichols, [name] S, NP  metFORMIN  (GLUCOPHAGE ) 1000 MG tablet TAKE 1 TABLET(1000 MG) BY MOUTH TWICE DAILY WITH A MEAL Patient not taking: Reported on 12/27/2023 10/15/23   [name], NP  oxyCODONE -acetaminophen  (PERCOCET) 10-325 MG tablet Take 1 tablet by mouth 4 (four) times daily as needed for pain. 08/11/22   [provider]  simvastatin  (ZOCOR ) 10 MG tablet TAKE 1 TABLET(10 MG) BY MOUTH DAILY 10/08/23   Nichols, [name] S, NP  tirzepatide  (MOUNJARO ) 7.5 MG/0.5ML Pen Inject 7.5 mg into the skin once a week. Patient not taking: Reported on 12/27/2023 11/14/23   [name], NP     Critical care time: 30 minutes

## 2024-01-02 NOTE — Progress Notes (Signed)
 Patient ID: Riley Clarke, male   DOB: 08-May-1956, 68 y.o.   MRN: [id number] [facility] Progress Note   Assessment/ Plan:   1.  Acute kidney injury: Appears to be hemodynamically mediated with preceding excessive GI losses and ongoing ACE inhibitor/thiazide diuretic as well as Jardiance .  Likely prerenal +/- evolution to ischemic ATN.  Started on CRRT overnight after persistent metabolic acidosis refractory to medical management.  Continue CRRT at the current prescription and begin low-dose heparin  to maintain circuit patency (currently filter lasting 2 to 3 hours).  Will recheck labs again today morning to decide on need for change in CRRT prescription to incorporate pre-/post filter bicarbonate. 2.  Metabolic acidosis/acidemia: Awaiting basic metabolic panel but data largely pointing to metabolic acidosis likely arising from lactic acidosis (metformin  use/hypoperfusion) and combination of DKA as well as acute kidney injury.  Started on CRRT and currently with piggyback sodium bicarbonate . 3.  Diabetic ketoacidosis: Started on aggressive fluid replacement as well as insulin  therapy for glycemic management.  Fluid adjustments/insulin  therapy per primary service. 4.  Hyponatremia: Hypovolemic hyponatremia compounded by pseudohyponatremia from hyperglycemia.  Improving with glycemic control. 5.  Shock: Circulatory collapse likely related to his metabolic acidemia.  Continue CRRT and monitor on Levophed /fluids.  Subjective:   Started on CRRT overnight with worsening metabolic acidosis and circulatory collapse.   Objective:   BP (!) 118/41   Pulse 78   Temp 98.8 F (37.1 C)   Resp (!) 29   Ht 6' (1.829 m)   Wt 103.6 kg   SpO2 94%   BMI 30.98 kg/m   Intake/Output Summary (Last 24 hours) at 01/02/2024 0828 Last data filed at 01/02/2024 0800 Gross per 24 hour  Intake 7529.66 ml  Output 1549 ml  Net 5980.66 ml   Weight change:   Physical Exam: Gen: Comfortably resting in  bed, on CRRT CVS: Pulse regular rhythm, normal rate, S1-S2 Resp: Anteriorly clear to auscultation, no rales/rhonchi.  Remains tachypneic Abd: Soft, flat, mild tenderness over epigastric area, bowel sounds normal Ext: No lower extremity edema  Imaging: DG Chest Port 1 View Result Date: 01/01/2024 EXAM: 1 VIEW XRAY OF THE CHEST 01/01/2024 11:25:00 PM COMPARISON: Earlier today at 14:01 hours. CLINICAL HISTORY: Encounter for central line placement. FINDINGS: LUNGS AND PLEURA: No focal pulmonary opacity. No pulmonary edema. No pleural effusion. No pneumothorax. HEART AND MEDIASTINUM: No acute abnormality of the cardiac and mediastinal silhouettes. BONES AND SOFT TISSUES: No acute osseous abnormality. LINES AND TUBES: Right IJ venous catheter terminates at the cavoatrial junction in appropriate position. IMPRESSION: 1. Right IJ venous catheter terminates at the cavoatrial junction. 2. No pneumothorax. Electronically signed by: Pinkie Pebbles MD 01/01/2024 11:29 PM EDT RP Workstation: [id number]   CT ABDOMEN PELVIS WO CONTRAST Result Date: 01/01/2024 CLINICAL DATA:  Abdominal pain, acute, nonlocalized. EXAM: CT ABDOMEN AND PELVIS WITHOUT CONTRAST TECHNIQUE: Multidetector CT imaging of the abdomen and pelvis was performed following the standard protocol without IV contrast. RADIATION DOSE REDUCTION: This exam was performed according to the departmental dose-optimization program which includes automated exposure control, adjustment of the mA and/or kV according to patient size and/or use of iterative reconstruction technique. COMPARISON:  CT abdomen/pelvis dated 08/30/2020. FINDINGS: Lower chest: No acute abnormality. Hepatobiliary: No focal liver abnormality is seen. No gallstones, gallbladder wall thickening, or biliary dilatation. Pancreas: Unremarkable. No pancreatic ductal dilatation or surrounding inflammatory changes. Spleen: Normal in size without focal abnormality. Adrenals/Urinary Tract: Adrenal glands  are unremarkable. No urolithiasis or hydronephrosis. Bladder is minimally distended  and otherwise grossly unremarkable. Stomach/Bowel: Stomach is within normal limits. Appendix appears normal. No evidence of bowel wall thickening, distention, or inflammatory changes. Vascular/Lymphatic: Abdominal aorta is normal in caliber with scattered atherosclerotic calcification. No enlarged abdominal or pelvic lymph nodes. Reproductive: Prostate is enlarged measuring up to 5.4 cm diameter, similar to the prior exam. Other: No abdominal wall hernia or abnormality. No abdominopelvic ascites. No free air. Musculoskeletal: No acute osseous abnormality. No suspicious osseous lesion. Status post PLIF L4-S1. Multilevel degenerative changes of the thoracolumbar spine. IMPRESSION: 1. No acute localizing findings in the abdomen or pelvis. 2. Mild prostatomegaly. 3.  Aortic Atherosclerosis (ICD10-I70.0). Electronically Signed   By: Harrietta Sherry M.D.   On: 01/01/2024 15:50   DG Chest Port 1 View Result Date: 01/01/2024 CLINICAL DATA:  Nausea, vomiting, and diarrhea.  Possible sepsis. EXAM: PORTABLE CHEST 1 VIEW COMPARISON:  10/31/2011 FINDINGS: Thoracic spondylosis. Cardiac and mediastinal margins appear normal. The lungs appear clear. No blunting of the costophrenic angles. Moderate degenerative glenohumeral arthropathy bilaterally. IMPRESSION: 1. No acute findings. 2. Thoracic spondylosis. 3. Moderate degenerative glenohumeral arthropathy bilaterally. Electronically Signed   By: Ryan Salvage M.D.   On: 01/01/2024 14:36    Labs: BMET Recent Labs  Lab 01/01/24 1410 01/01/24 1440 01/01/24 2043 01/01/24 2311 01/02/24 0452  NA 127* 121*  121* 131* 132* 134*  K 5.0 4.6  4.7 4.0 3.7 3.7  CL 83* 91* 91* 91* 92*  CO2 <7*  --  <7* <7* <7*  GLUCOSE 393* 395* 222* 192* 119*  BUN 85* 93* 84* 83* 74*  CREATININE 12.21* 12.20* 11.26* 10.60* 9.15*  CALCIUM  7.6*  --  7.7* 7.2* 7.6*  PHOS  --   --  >30.0*  --  10.3*    CBC Recent Labs  Lab 01/01/24 1410 01/01/24 1440 01/01/24 2041 01/02/24 0451 01/02/24 0649  WBC 16.4*  --  13.8* 8.9  --   NEUTROABS 12.5*  --   --  7.7  --   HGB 12.7* 13.3  14.3 12.5* 13.0 11.6*  HCT 39.7 39.0  42.0 40.1 39.9 34.9*  MCV 76.8*  --  78.2* 74.2*  --   PLT 406*  --  381 259  --     Medications:     Chlorhexidine  Gluconate Cloth  6 each Topical Daily   heparin   5,000 Units Subcutaneous Q8H   Gordy Blanch, MD 01/02/2024, 8:28 AM

## 2024-01-02 NOTE — Progress Notes (Signed)
 eLink Physician-Brief Progress Note Patient Name: Riley Clarke DOB: 25-Apr-1956 MRN: [id number]   Date of Service  01/02/2024  HPI/Events of Note  Stool occult blood positive.  eICU Interventions  Type and screen + H & H Q 4 hours x 3 ordered.        Jadrian Bulman U Patty Lopezgarcia 01/02/2024, 6:02 AM

## 2024-01-02 NOTE — Progress Notes (Signed)
 eLink Physician-Brief Progress Note Patient Name: Riley Clarke DOB: April 12, 1956 MRN: [id number]   Date of Service  01/02/2024  HPI/Events of Note  PH 7.18  eICU Interventions  Sodium Bicarbonate  100 meq iv x 1 ordered.        Rondell Pardon U Parsa Rickett 01/02/2024, 5:23 AM

## 2024-01-02 NOTE — Inpatient Diabetes Management (Signed)
 Inpatient Diabetes Program Recommendations  AACE/ADA: New Consensus Statement on Inpatient Glycemic Control (2015)  Target Ranges:  Prepandial:   less than 140 mg/dL      Peak postprandial:   less than 180 mg/dL (1-2 hours)      Critically ill patients:  140 - 180 mg/dL   Lab Results  Component Value Date   GLUCAP 147 (H) 01/02/2024   HGBA1C 10.3 (A) 10/15/2023    Review of Glycemic Control  Diabetes history: DM 2 Outpatient Diabetes medications: Jardiance  25 mg Daily, Metformin  1000 mg bid, Mounjaro  7.5 mg weekly Current orders for Inpatient glycemic control:  Semglee  12 units Daily, Novolog  0-6 units tid A1c 10.3 on 5/5   Note: pt had recent visit at PCP on 7/16 pt had been without jardinace and metformin  and at the time on mounjaro  5 mg weekly, oral meds restarted and mounjaro  dose increased 7.5 mg weekly  Follow glucose trends on current regimen  Thanks,  Clotilda Bull RN, MSN, BC-ADM Inpatient Diabetes Coordinator Team Pager [phone] (8a-5p)

## 2024-01-02 NOTE — Progress Notes (Signed)
 Initial Nutrition Assessment  DOCUMENTATION CODES:  Not applicable  INTERVENTION:  Monitor for further diet advancement Encourage adequate, small frequent oral intake Boost Breeze po TID, each supplement provides 250 kcal and 9 grams of protein 30 ml ProSource Plus TID, each supplement provides 100 kcals and 15 grams protein.  Renal MVI with minerals daily  NUTRITION DIAGNOSIS:  Inadequate oral intake related to acute illness, nausea, vomiting, poor appetite as evidenced by per patient/family report  GOAL:  Patient will meet greater than or equal to 90% of their needs  MONITOR:  PO intake, Diet advancement, Labs, Weight trends, I & O's  REASON FOR ASSESSMENT:  Consult Assessment of nutrition requirement/status (CRRT protocol)  ASSESSMENT:  Pt admitted with DKA, hyponatremia, and AKI after starting mounjaro  a week ago. PMH significant for DM2. HLD, HTN, chronic back pain.   7/22: admitted, CRRT initiated 7/23: clear liquid diet  Per Nephrology, likely prerenal +/- evolution to ischemic ATN. Started on CRRT overnight after persistent metabolic acidosis refractory to medical management.   GI panel notably positive for salmonella and e coli.   Checked in with patient at bedside. Nutrition history is limited d/t pt being quite drowsy.   He states that his appetite has been subpar for some time, though worsened with presentation of acute illness (n/v/d) since last Wednesday. He reports that he took a dose of mounjaro  two days prior to onset of acute illness. He recalls tolerance to only water  within the last week. Per RN, this was pt's reported second dose of mounjaro . Pt c/o left sided abdominal pain/tenderness. Abdomen notably distended, slightly taut.   Pt reports that he has experienced unintentional weight loss over a period of time but does not recall how much or within what time frame.   Overall, average weights within the last year appear to have fluctuated between 100-102  kg. On 6/4 outpatient MD appt, pt's weight was documented at highest weight for time frame at 106.1 kg.   Patient is at high risk for acute malnutrition given oral intake </=50% for >/= 5 days and significantly increased nutritionally requirements in setting of acute illness and CRRT.    Drains/lines: RIJ triple lumen UOP: x24 hours  CRRT: 1269 x12 hours  Medications: SSI 0-6 units TID, semglee  30 units daily Drips: Levo @ 24mcg/min Neo @ 30 mcg/min Sodium bicarbonate  Vaso @ 0.03 units/min  Labs:  Sodium 134 Chloride 92 CO2 <7 BUN 74 Cr 9.15 Corrected calcium  8.88 Phosphorus 10.3 Albumin 2.4 GFR 6 Lactic acid >9.0 (7/22) BHA 2.44, down from 4.84 CBG's 89-410 x24 hours No updated A1c  NUTRITION - FOCUSED PHYSICAL EXAM: Flowsheet Row Most Recent Value  Orbital Region No depletion  Upper Arm Region No depletion  Thoracic and Lumbar Region No depletion  Buccal Region No depletion  Temple Region No depletion  Clavicle Bone Region No depletion  Clavicle and Acromion Bone Region No depletion  Scapular Bone Region No depletion  Dorsal Hand No depletion  Patellar Region No depletion  Anterior Thigh Region No depletion  Posterior Calf Region No depletion  Edema (RD Assessment) None  Hair Reviewed  Eyes Reviewed  Mouth Reviewed  Skin Reviewed  Nails Reviewed    Diet Order:   Diet Order             Diet clear liquid Room service appropriate? Yes; Fluid consistency: Thin  Diet effective now                   EDUCATION  NEEDS:   Not appropriate for education at this time  Skin:  Skin Assessment: Reviewed RN Assessment  Last BM:  7/23- via flatus pouch, + 3 unmeasured occurrences  Height:   Ht Readings from Last 1 Encounters:  01/01/24 6' (1.829 m)    Weight:   Wt Readings from Last 1 Encounters:  01/02/24 103.6 kg    Ideal Body Weight:  80.9 kg  BMI:  Body mass index is 30.98 kg/m.  Estimated Nutritional Needs:   Kcal:   2200-2400  Protein:  130-145g  Fluid:  1 L + UOP  Royce Maris, RDN, LDN Clinical Nutrition See AMiON for contact information.

## 2024-01-02 NOTE — Progress Notes (Signed)
  Echocardiogram 2D Echocardiogram has been performed.  Riley Clarke, RDCS 01/02/2024, 8:42 AM

## 2024-01-03 DIAGNOSIS — I4891 Unspecified atrial fibrillation: Secondary | ICD-10-CM | POA: Diagnosis not present

## 2024-01-03 DIAGNOSIS — E871 Hypo-osmolality and hyponatremia: Secondary | ICD-10-CM | POA: Diagnosis not present

## 2024-01-03 DIAGNOSIS — N179 Acute kidney failure, unspecified: Secondary | ICD-10-CM | POA: Diagnosis not present

## 2024-01-03 DIAGNOSIS — E1110 Type 2 diabetes mellitus with ketoacidosis without coma: Secondary | ICD-10-CM | POA: Diagnosis not present

## 2024-01-03 LAB — APTT: aPTT: 98 s — ABNORMAL HIGH (ref 24–36)

## 2024-01-03 LAB — RENAL FUNCTION PANEL
Albumin: 2 g/dL — ABNORMAL LOW (ref 3.5–5.0)
Albumin: 1.9 g/dL — ABNORMAL LOW (ref 3.5–5.0)
Anion gap: 30 — ABNORMAL HIGH (ref 5–15)
Anion gap: 26 — ABNORMAL HIGH (ref 5–15)
BUN: 33 mg/dL — ABNORMAL HIGH (ref 8–23)
BUN: 28 mg/dL — ABNORMAL HIGH (ref 8–23)
CO2: 13 mmol/L — ABNORMAL LOW (ref 22–32)
CO2: 16 mmol/L — ABNORMAL LOW (ref 22–32)
Calcium: 7.2 mg/dL — ABNORMAL LOW (ref 8.9–10.3)
Calcium: 6.9 mg/dL — ABNORMAL LOW (ref 8.9–10.3)
Chloride: 100 mmol/L (ref 98–111)
Chloride: 92 mmol/L — ABNORMAL LOW (ref 98–111)
Creatinine, Ser: 3.23 mg/dL — ABNORMAL HIGH (ref 0.61–1.24)
Creatinine, Ser: 2.08 mg/dL — ABNORMAL HIGH (ref 0.61–1.24)
GFR, Estimated: 20 mL/min — ABNORMAL LOW (ref >60)
GFR, Estimated: 34 mL/min — ABNORMAL LOW (ref >60)
Glucose, Bld: 311 mg/dL — ABNORMAL HIGH (ref 70–99)
Glucose, Bld: 261 mg/dL — ABNORMAL HIGH (ref 70–99)
Phosphorus: 5 mg/dL — ABNORMAL HIGH (ref 2.5–4.6)
Phosphorus: 3.7 mg/dL (ref 2.5–4.6)
Potassium: 4.5 mmol/L (ref 3.5–5.1)
Potassium: 4.5 mmol/L (ref 3.5–5.1)
Sodium: 143 mmol/L (ref 135–145)
Sodium: 134 mmol/L — ABNORMAL LOW (ref 135–145)

## 2024-01-03 LAB — URINE CULTURE: Culture: NO GROWTH

## 2024-01-03 LAB — CBC
HCT: 31 % — ABNORMAL LOW (ref 39.0–52.0)
Hemoglobin: 10.4 g/dL — ABNORMAL LOW (ref 13.0–17.0)
MCH: 24.5 pg — ABNORMAL LOW (ref 26.0–34.0)
MCHC: 33.5 g/dL (ref 30.0–36.0)
MCV: 72.9 fL — ABNORMAL LOW (ref 80.0–100.0)
Platelets: 174 K/uL (ref 150–400)
RBC: 4.25 MIL/uL (ref 4.22–5.81)
RDW: 17 % — ABNORMAL HIGH (ref 11.5–15.5)
WBC: 18.2 K/uL — ABNORMAL HIGH (ref 4.0–10.5)
nRBC: 0.1 % (ref 0.0–0.2)

## 2024-01-03 LAB — HEPARIN LEVEL (UNFRACTIONATED)
Heparin Unfractionated: 0.85 [IU]/mL — ABNORMAL HIGH (ref 0.30–0.70)
Heparin Unfractionated: 0.4 [IU]/mL (ref 0.30–0.70)

## 2024-01-03 LAB — GLUCOSE, CAPILLARY
Glucose-Capillary: 276 mg/dL — ABNORMAL HIGH (ref 70–99)
Glucose-Capillary: 295 mg/dL — ABNORMAL HIGH (ref 70–99)
Glucose-Capillary: 236 mg/dL — ABNORMAL HIGH (ref 70–99)
Glucose-Capillary: 251 mg/dL — ABNORMAL HIGH (ref 70–99)
Glucose-Capillary: 280 mg/dL — ABNORMAL HIGH (ref 70–99)

## 2024-01-03 LAB — MAGNESIUM: Magnesium: 2.4 mg/dL (ref 1.7–2.4)

## 2024-01-03 MED ORDER — MELATONIN 3 MG PO TABS
3.0000 mg | ORAL_TABLET | Freq: Once | ORAL | Status: AC
  Administered 2024-01-03: 3 mg via ORAL
  Filled 2024-01-03: qty 1

## 2024-01-03 MED ORDER — INSULIN ASPART 100 UNIT/ML IJ SOLN
0.0000 [IU] | Freq: Three times a day (TID) | INTRAMUSCULAR | Status: DC
  Administered 2024-01-03: 5 [IU] via SUBCUTANEOUS
  Administered 2024-01-03: 3 [IU] via SUBCUTANEOUS

## 2024-01-03 MED ORDER — INSULIN ASPART 100 UNIT/ML IJ SOLN
0.0000 [IU] | Freq: Every day | INTRAMUSCULAR | Status: DC
  Administered 2024-01-03: 3 [IU] via SUBCUTANEOUS
  Administered 2024-01-04 – 2024-01-05 (×2): 5 [IU] via SUBCUTANEOUS

## 2024-01-03 MED ORDER — LACTATED RINGERS IV BOLUS
1000.0000 mL | Freq: Once | INTRAVENOUS | Status: AC
  Administered 2024-01-03: 1000 mL via INTRAVENOUS

## 2024-01-03 MED ORDER — STERILE WATER FOR INJECTION IV SOLN
INTRAVENOUS | Status: DC
Start: 2024-01-03 — End: 2024-01-04
  Filled 2024-01-03 (×7): qty 150

## 2024-01-03 MED ORDER — INSULIN GLARGINE-YFGN 100 UNIT/ML ~~LOC~~ SOLN
16.0000 [IU] | Freq: Every day | SUBCUTANEOUS | Status: DC
  Administered 2024-01-03: 16 [IU] via SUBCUTANEOUS
  Filled 2024-01-03 (×2): qty 0.16

## 2024-01-03 MED ORDER — INSULIN ASPART 100 UNIT/ML IJ SOLN
0.0000 [IU] | Freq: Three times a day (TID) | INTRAMUSCULAR | Status: DC
  Administered 2024-01-04: 3 [IU] via SUBCUTANEOUS
  Administered 2024-01-04: 5 [IU] via SUBCUTANEOUS
  Administered 2024-01-04 – 2024-01-05 (×2): 3 [IU] via SUBCUTANEOUS

## 2024-01-03 NOTE — Plan of Care (Signed)
  Problem: Education: Goal: Ability to describe self-care measures that may prevent or decrease complications (Diabetes Survival Skills Education) will improve Outcome: Progressing   Problem: Education: Goal: Knowledge of General Education information will improve Description: Including pain rating scale, medication(s)/side effects and non-pharmacologic comfort measures Outcome: Progressing   Problem: Clinical Measurements: Goal: Ability to maintain clinical measurements within normal limits will improve Outcome: Progressing   Problem: Pain Managment: Goal: General experience of comfort will improve and/or be controlled Outcome: Progressing

## 2024-01-03 NOTE — Progress Notes (Signed)
 NAME:  Riley Clarke, MRN:  [id number], DOB:  01-05-1956, LOS: 2 ADMISSION DATE:  01/01/2024  History of Present Illness:  Pt is a with significant pmhx of diabetes 2, HLD, HTN, Chronic Back pain, and Arthritis who presents by EMS from home with N/V/D for approxmiately the 7 days and abdominal pain. Notes persistent diarrhea for the last week but just 3 episodes of vomiting. Also decreased to no urine output. No sick contacts. Patient states that patient had a mounjaro  injection last Monday 12/24/23, which he contributes to the abdominal pain. He was also still taking Metformin , lisinopril /hydrochlorothiazide , and Jardiance .    Initial Istat labs revealed- metabolic acidosis- venous pH 6.963, lactic acid of 10.7, creatinine of 12.20, glucose of 395, BUN of 93. CBC 16.4, HCT 39, and platelets 406. Concern for DKA and Renal Failure. Upon initial assessment of patient, patient having kussmaul respirations, tachycardia- HR 100s, BP within normal limits, O2 sat 99% on RA. PCCM consulted for admitted due to metabolic derangements along with Nephro consult.   Pertinent  Medical History  Arthritis Chronic back pain on chronic opioids T2DM GERD HLD HTN Vit D Deficiency  Significant Hospital Events: Including procedures, antibiotic start and stop dates in addition to other pertinent events   7/22 admitted to [facility], CRRT started due to worsening metabolic acidosis  7/23 GIPP positive salmonella, EPEC, ETEC. Ceftriaxone  started  Interim History / Subjective:  Still remains with very frequent diarrhea overnight  Objective    Blood pressure 99/66, pulse 69, temperature 98.6 F (37 C), resp. rate (!) 26, height 6' (1.829 m), weight 97.7 kg, SpO2 100%.        Intake/Output Summary (Last 24 hours) at 01/03/2024 1201 Last data filed at 01/03/2024 1100 Gross per 24 hour  Intake 4476.94 ml  Output 6472.4 ml  Net -1995.46 ml   Filed Weights   01/02/24 0010 01/02/24 0500 01/03/24 0500  Weight:  103.6 kg 103.6 kg 97.7 kg    Examination: General: resting comfortably in bed Lungs: CTAB, breathing comfortably on RA Cardiovascular: RRR, no m/r/g Abdomen: Soft, non-distended, non-tender Extremities: no BLE edema Neuro: A&Ox4  Resolved problem list  Hyponatremia Assessment and Plan  Metabolic Acidosis Lactic Acidosis DKA, hx T2DM Shock Metabolic acidosis likely secondary to metformin  induced lactic acidosis, AKI, and DKA - 16U basal insulin , very sensitive SSI - Sodium bicarb 100ml/h  - levophed , vasopressin  gtt - wean as tolerated for MAP >65 - monitor BMP  Salmonella, ETEC, EPEC - Continue Ceftriaxone  (7/23-) - Supportive care  - Enteric precautions    Acute Renal Failure Suspected pre-renal secondary to GI losses, ACE-I/thiazide use, Jardiance  use - Nephrology following - Ongoing CRRT - Monitor RFP   Afib with RVR No hx afib. Echo LVEF 60-65%, no WMA, RV mildly enlarged with moderately reduced systolic function.  - Amiodarone  30mg /h gtt  - Heparin  for anticoagulation    Chronic back pain - tylenol  q6h PRN mild pain - oxycodone  5mg  q6h PRN severe pain - avoid nephrotoxic medications  Best Practice (right click and Reselect all SmartList Selections daily)   Diet/type: clear liquids DVT prophylaxis systemic heparin  Pressure ulcer(s): N/A GI prophylaxis: N/A Lines: Dialysis Catheter Foley:  Yes, and it is still needed Code Status:  full code Last date of multidisciplinary goals of care discussion []   Labs   CBC: Recent Labs  Lab 01/01/24 1410 01/01/24 1440 01/01/24 2041 01/02/24 0451 01/02/24 0649 01/02/24 1055 01/02/24 1429  WBC 16.4*  --  13.8* 8.9  --   --   --  NEUTROABS 12.5*  --   --  7.7  --   --   --   HGB 12.7*   < > 12.5* 13.0 11.6* 11.9* 11.9*  HCT 39.7   < > 40.1 39.9 34.9* 35.6* 34.4*  MCV 76.8*  --  78.2* 74.2*  --   --   --   PLT 406*  --  381 259  --   --   --    < > = values in this interval not displayed.    Basic  Metabolic Panel: Recent Labs  Lab 01/01/24 2043 01/01/24 2311 01/02/24 0451 01/02/24 0452 01/02/24 1055 01/02/24 1553 01/03/24 0421 01/03/24 0422  NA 131* 132*  --  134* 135 135  --  143  K 4.0 3.7  --  3.7 3.8 3.5  --  4.5  CL 91* 91*  --  92* 93* 92*  --  100  CO2 <7* <7*  --  <7* 8* 12*  --  13*  GLUCOSE 222* 192*  --  119* 154* 202*  --  311*  BUN 84* 83*  --  74* 60* 48*  --  33*  CREATININE 11.26* 10.60*  --  9.15* 7.09* 5.58*  --  3.23*  CALCIUM  7.7* 7.2*  --  7.6* 6.9* 7.2*  --  7.2*  MG 2.9*  --  2.3  --   --   --  2.4  --   PHOS >30.0*  --   --  10.3*  --  5.6*  --  5.0*   GFR: Estimated Creatinine Clearance: 27.2 mL/min (A) (by C-G formula based on SCr of 3.23 mg/dL (H)). Recent Labs  Lab 01/01/24 1410 01/01/24 1440 01/01/24 2041 01/01/24 2043 01/02/24 0451  WBC 16.4*  --  13.8*  --  8.9  LATICACIDVEN  --  10.7*  --  >9.0*  --     Liver Function Tests: Recent Labs  Lab 01/01/24 1410 01/01/24 2043 01/02/24 0452 01/02/24 1553 01/03/24 0422  AST 34 39  --   --   --   ALT 21 23  --   --   --   ALKPHOS 117 129*  --   --   --   BILITOT 0.5 0.7  --   --   --   PROT 6.4* 6.1*  --   --   --   ALBUMIN 2.5* 2.4* 2.4* 2.0* 2.0*   Recent Labs  Lab 01/01/24 1410  LIPASE 69*   No results for input(s): AMMONIA in the last 168 hours.  ABG    Component Value Date/Time   PHART 7.34 (L) 01/02/2024 0843   PCO2ART <18 (LL) 01/02/2024 0843   PO2ART 115 (H) 01/02/2024 0843   HCO3 6.5 (L) 01/02/2024 0843   TCO2 <5 (L) 01/01/2024 1440   TCO2 7 (L) 01/01/2024 1440   ACIDBASEDEF 16.1 (H) 01/02/2024 0843   O2SAT 99.2 01/02/2024 0843     Coagulation Profile: Recent Labs  Lab 01/01/24 1410  INR 1.3*    Cardiac Enzymes: No results for input(s): CKTOTAL, CKMB, CKMBINDEX, TROPONINI in the last 168 hours.  HbA1C: Hemoglobin A1C  Date/Time Value Ref Range Status  10/15/2023 02:34 PM 10.3 (A) 4.0 - 5.6 % Final  04/16/2023 03:43 PM 8.1 (A) 4.0 - 5.6  % Final   HbA1c, POC (prediabetic range)  Date/Time Value Ref Range Status  01/12/2022 02:44 PM 7.2 (A) 5.7 - 6.4 % Final  10/12/2021 03:18 PM 9.9 (A) 5.7 - 6.4 % Final  HbA1c, POC (controlled diabetic range)  Date/Time Value Ref Range Status  01/12/2023 03:04 PM 10.1 (A) 0.0 - 7.0 % Final  01/12/2022 02:44 PM 7.2 (A) 0.0 - 7.0 % Final   HbA1c POC (<> result, manual entry)  Date/Time Value Ref Range Status  01/12/2022 02:44 PM 7.2 4.0 - 5.6 % Final   Hgb A1c MFr Bld  Date/Time Value Ref Range Status  08/31/2022 01:30 PM 8.6 (H) 4.8 - 5.6 % Final    Comment:    (NOTE)         Prediabetes: 5.7 - 6.4         Diabetes: >6.4         Glycemic control for adults with diabetes: <7.0     CBG: Recent Labs  Lab 01/02/24 1637 01/02/24 2148 01/02/24 2322 01/03/24 0742 01/03/24 1116  GLUCAP 226* 259* 265* 276* 295*    Review of Systems:   N/a   Past Medical History:  He,  has a past medical history of Arthritis, Chronic back pain, Diabetes mellitus without complication (HCC), GERD (gastroesophageal reflux disease), HLD (hyperlipidemia), Hypertension, and Vitamin D  deficiency.   Surgical History:   Past Surgical History:  Procedure Laterality Date   ANTERIOR CERVICAL DECOMP/DISCECTOMY FUSION N/A 09/05/2022   Procedure: Anterior Cervical Decompression Fusion  - Cervical three-Cervical four;  Surgeon: Louis Shove, MD;  Location[facility];  Service: Neurosurgery;  Laterality: N/A;   CERVICAL DISC SURGERY  99   LUMBAR FUSION     LUMBAR LAMINECTOMY/DECOMPRESSION MICRODISCECTOMY Bilateral 03/09/2021   Procedure: Laminectomy and Foraminotomy - bilateral - Lumbar two-Lumbar three;  Surgeon: Louis Shove, MD;  Location: [facility] OR;  Service: Neurosurgery;  Laterality: Bilateral;   ROTATOR CUFF REPAIR Bilateral    SHOULDER ARTHROSCOPY WITH ROTATOR CUFF REPAIR AND SUBACROMIAL DECOMPRESSION Right 12/30/2018   Procedure: SHOULDER ARTHROSCOPY WITH ROTATOR CUFF REPAIR AND SUBACROMIAL DECOMPRESSION,  DISTAL CLAVICLE EXICISION,;  Surgeon: Cristy Bonner DASEN, MD;  Location: [facility];  Service: Orthopedics;  Laterality: Right;   SHOULDER ARTHROSCOPY WITH SUBACROMIAL DECOMPRESSION, ROTATOR CUFF REPAIR AND BICEP TENDON REPAIR Left 06/19/2019   Procedure: LEFT SHOULDER ARTHROSCOPY, EXTENTSIVE DEBRIDEMENT, DISTAL CLAVICULECTOMY, SUBACROMIAL DECOMPRESSION, PARTIAL ACROMIOPLASTY WITH CORACOACROMIAL RELEASE, ROTATOR CUFFF REPAIR AND BICEP TENODESIS;  Surgeon: Cristy Bonner DASEN, MD;  Location:  SURGERY CENTER;  Service: Orthopedics;  Laterality: Left;     Social History:   reports that he quit smoking about 3 years ago. His smoking use included cigarettes. He started smoking about 28 years ago. He has a 6.3 pack-year smoking history. He has never used smokeless tobacco. He reports current alcohol use of about 4.0 standard drinks of alcohol per week. He reports that he does not use drugs.   Family History:  His family history includes Diabetes in his mother; Heart disease in his mother. There is no history of Amblyopia, Blindness, Cataracts, Hypertension, Macular degeneration, Retinal detachment, Strabismus, Esophageal cancer, Colon cancer, Pancreatic cancer, Ulcerative colitis, Inflammatory bowel disease, Liver disease, Rectal cancer, or Stomach cancer.   Allergies Allergies  Allergen Reactions   Cymbalta [Duloxetine Hcl] Nausea And Vomiting and Other (See Comments)    Dizziness/headaches   Shrimp [Shellfish Allergy]     intolerant   Valium  [Diazepam ] Itching     Home Medications  Prior to Admission medications   Medication Sig Start Date End Date Taking? Authorizing Provider  Blood Glucose Monitoring Suppl DEVI 1 each by Does not apply route in the morning and at bedtime. May substitute to any manufacturer covered by  patient's insurance. 02/27/23   [name], NP  cyclobenzaprine  (FLEXERIL ) 10 MG tablet TAKE 1 TABLET(10 MG) BY MOUTH THREE TIMES DAILY AS NEEDED FOR MUSCLE  SPASMS 11/06/23   [name], NP  empagliflozin  (JARDIANCE ) 25 MG TABS tablet Take 1 tablet (25 mg total) by mouth daily. Patient not taking: Reported on 12/27/2023 03/14/23   [name], NP  esomeprazole  (NEXIUM ) 40 MG capsule TAKE 1 CAPSULE BY MOUTH EVERY MORNING BEFORE BREAKFAST 10/15/23   Nichols, [name] S, NP  gabapentin  (NEURONTIN ) 300 MG capsule Take 1 capsule (300 mg total) by mouth 3 (three) times daily. 04/20/23   Nichols, [name] S, NP  hydrocortisone  (ANUSOL -HC) 2.5 % rectal cream Place 1 Application rectally 2 (two) times daily. Use for 10 days 01/29/23   Kerman Vina HERO, NP  lisinopril -hydrochlorothiazide  (ZESTORETIC ) 20-25 MG tablet TAKE 1 TABLET BY MOUTH DAILY 10/02/23 10/01/24  Nichols, [name] S, NP  metFORMIN  (GLUCOPHAGE ) 1000 MG tablet TAKE 1 TABLET(1000 MG) BY MOUTH TWICE DAILY WITH A MEAL Patient not taking: Reported on 12/27/2023 10/15/23   [name], NP  oxyCODONE -acetaminophen  (PERCOCET) 10-325 MG tablet Take 1 tablet by mouth 4 (four) times daily as needed for pain. 08/11/22   [provider]  simvastatin  (ZOCOR ) 10 MG tablet TAKE 1 TABLET(10 MG) BY MOUTH DAILY 10/08/23   Nichols, [name] S, NP  tirzepatide  (MOUNJARO ) 7.5 MG/0.5ML Pen Inject 7.5 mg into the skin once a week. Patient not taking: Reported on 12/27/2023 11/14/23   [name], NP     Critical care time: 30 minutes

## 2024-01-03 NOTE — Progress Notes (Signed)
 Patient ID: Riley Clarke, male   DOB: March 13, 1956, 68 y.o.   MRN: [id number] [facility] Progress Note   Assessment/ Plan:   1.  Acute kidney injury: Nonoliguric overnight.  Appears to be hemodynamically mediated with preceding excessive GI losses and ongoing ACE inhibitor/thiazide diuretic as well as Jardiance .  Likely prerenal +/- evolution to ischemic ATN.  Started on CRRT 7/22 after persistent metabolic acidosis refractory to medical management.  Continues to have persistent metabolic acidosis with ongoing CRRT, will switch prescription to include post filter bicarbonate and discontinue piggyback sodium bicarbonate . 2.  Metabolic acidosis/acidemia: Awaiting basic metabolic panel but data largely pointing to metabolic acidosis likely arising from lactic acidosis (metformin  use/hypoperfusion) and combination of DKA as well as acute kidney injury.  Prescription changed to post filter sodium bicarbonate . 3.  Diabetic ketoacidosis: Started on aggressive fluid replacement as well as insulin  therapy for glycemic management.  Intravenous fluids discontinued earlier after transition to CRRT. 4.  Hyponatremia: Hypovolemic hyponatremia compounded by pseudohyponatremia from hyperglycemia.  Corrected with CRRT ongoing efforts at glycemic control. 5.  Shock: Circulatory collapse likely related to his metabolic acidemia.  Remains on pressors with anticipated ability to wean this as his acidemia improves.  Subjective:   Reports to be feeling better this morning.   Objective:   BP (!) 131/92   Pulse 77   Temp 98.8 F (37.1 C)   Resp (!) 27   Ht 6' (1.829 m)   Wt 97.7 kg   SpO2 97%   BMI 29.21 kg/m   Intake/Output Summary (Last 24 hours) at 01/03/2024 0738 Last data filed at 01/03/2024 0700 Gross per 24 hour  Intake 5069.22 ml  Output 6731 ml  Net -1661.78 ml   Weight change: -7.081 kg  Physical Exam: Gen: Comfortably resting in bed, on CRRT.  Awake/alert and more communicative  than yesterday CVS: Pulse regular rhythm, normal rate, S1-S2 Resp: Anteriorly clear to auscultation, no rales/rhonchi.  Remains tachypneic Abd: Soft, flat, mild tenderness over epigastric area, bowel sounds normal Ext: No lower extremity edema  Imaging: ECHOCARDIOGRAM COMPLETE Result Date: 01/02/2024    ECHOCARDIOGRAM REPORT   Patient Name:   Riley Clarke Date of Exam: 01/02/2024 Medical Rec #:  [id number]        Height:       72.0 in Accession #:    [id number]       Weight:       228.4 lb Date of Birth:  19-Nov-1955        BSA:          2.254 m Patient Age:    68 years         BP:           114/47 mmHg Patient Gender: M                HR:           80 bpm. Exam Location:  Inpatient Procedure: 2D Echo, Cardiac Doppler, Color Doppler and Intracardiac            Opacification Agent (Both Spectral and Color Flow Doppler were            utilized during procedure). STAT ECHO Indications:    Atrial Fibrillation  History:        Patient has no prior history of Echocardiogram examinations.                 Risk Factors:Diabetes, Former Smoker, Hypertension and  Dyslipidemia.  Sonographer:    Koleen Popper RDCS Referring Phys: [id number] OKORONKWO U OGAN IMPRESSIONS  1. Left ventricular ejection fraction, by estimation, is 60 to 65%. The left ventricle has normal function. The left ventricle has no regional wall motion abnormalities. Left ventricular diastolic parameters were normal.  2. Right ventricular systolic function is moderately reduced. The right ventricular size is mildly enlarged. There is normal pulmonary artery systolic pressure.  3. The mitral valve is normal in structure. No evidence of mitral valve regurgitation. No evidence of mitral stenosis.  4. The aortic valve is normal in structure. Aortic valve regurgitation is not visualized. No aortic stenosis is present.  5. The inferior vena cava is normal in size with greater than 50% respiratory variability, suggesting right atrial pressure  of 3 mmHg. FINDINGS  Left Ventricle: Left ventricular ejection fraction, by estimation, is 60 to 65%. The left ventricle has normal function. The left ventricle has no regional wall motion abnormalities. Definity  contrast agent was given IV to delineate the left ventricular  endocardial borders. The left ventricular internal cavity size was normal in size. There is no left ventricular hypertrophy. Left ventricular diastolic function could not be evaluated due to atrial fibrillation. Left ventricular diastolic parameters were normal. Right Ventricle: The right ventricular size is mildly enlarged. No increase in right ventricular wall thickness. Right ventricular systolic function is moderately reduced. There is normal pulmonary artery systolic pressure. The tricuspid regurgitant velocity is 2.24 m/s, and with an assumed right atrial pressure of 3 mmHg, the estimated right ventricular systolic pressure is 23.1 mmHg. Left Atrium: Left atrial size was normal in size. Right Atrium: Right atrial size was normal in size. Pericardium: There is no evidence of pericardial effusion. Mitral Valve: The mitral valve is normal in structure. No evidence of mitral valve regurgitation. No evidence of mitral valve stenosis. Tricuspid Valve: The tricuspid valve is normal in structure. Tricuspid valve regurgitation is trivial. No evidence of tricuspid stenosis. Aortic Valve: The aortic valve is normal in structure. Aortic valve regurgitation is not visualized. No aortic stenosis is present. Pulmonic Valve: The pulmonic valve was normal in structure. Pulmonic valve regurgitation is not visualized. No evidence of pulmonic stenosis. Aorta: The aortic root is normal in size and structure. Venous: The inferior vena cava is normal in size with greater than 50% respiratory variability, suggesting right atrial pressure of 3 mmHg. IAS/Shunts: No atrial level shunt detected by color flow Doppler.  LEFT VENTRICLE PLAX 2D LVIDd:         4.30 cm    Diastology LVIDs:         2.50 cm   LV e' medial:    6.31 cm/s LV PW:         1.20 cm   LV E/e' medial:  11.8 LV IVS:        1.10 cm   LV e' lateral:   6.96 cm/s LVOT diam:     2.10 cm   LV E/e' lateral: 10.7 LVOT Area:     3.46 cm  IVC IVC diam: 1.90 cm LEFT ATRIUM             Index LA diam:        3.20 cm 1.42 cm/m LA Vol (A2C):   56.2 ml 24.93 ml/m LA Vol (A4C):   64.9 ml 28.79 ml/m LA Biplane Vol: 60.6 ml 26.88 ml/m   AORTA Ao Root diam: 3.40 cm Ao Asc diam:  2.80 cm MITRAL VALVE  TRICUSPID VALVE MV Area (PHT): 3.37 cm    TR Peak grad:   20.1 mmHg MV Decel Time: 225 msec    TR Vmax:        224.00 cm/s MV E velocity: 74.40 cm/s MV A velocity: 53.90 cm/s  SHUNTS MV E/A ratio:  1.38        Systemic Diam: 2.10 cm Morene Brownie Electronically signed by Morene Brownie Signature Date/Time: 01/02/2024/4:22:18 PM    Final    DG Chest Port 1 View Result Date: 01/01/2024 EXAM: 1 VIEW XRAY OF THE CHEST 01/01/2024 11:25:00 PM COMPARISON: Earlier today at 14:01 hours. CLINICAL HISTORY: Encounter for central line placement. FINDINGS: LUNGS AND PLEURA: No focal pulmonary opacity. No pulmonary edema. No pleural effusion. No pneumothorax. HEART AND MEDIASTINUM: No acute abnormality of the cardiac and mediastinal silhouettes. BONES AND SOFT TISSUES: No acute osseous abnormality. LINES AND TUBES: Right IJ venous catheter terminates at the cavoatrial junction in appropriate position. IMPRESSION: 1. Right IJ venous catheter terminates at the cavoatrial junction. 2. No pneumothorax. Electronically signed by: Pinkie Pebbles MD 01/01/2024 11:29 PM EDT RP Workstation: [id number]   CT ABDOMEN PELVIS WO CONTRAST Result Date: 01/01/2024 CLINICAL DATA:  Abdominal pain, acute, nonlocalized. EXAM: CT ABDOMEN AND PELVIS WITHOUT CONTRAST TECHNIQUE: Multidetector CT imaging of the abdomen and pelvis was performed following the standard protocol without IV contrast. RADIATION DOSE REDUCTION: This exam was performed  according to the departmental dose-optimization program which includes automated exposure control, adjustment of the mA and/or kV according to patient size and/or use of iterative reconstruction technique. COMPARISON:  CT abdomen/pelvis dated 08/30/2020. FINDINGS: Lower chest: No acute abnormality. Hepatobiliary: No focal liver abnormality is seen. No gallstones, gallbladder wall thickening, or biliary dilatation. Pancreas: Unremarkable. No pancreatic ductal dilatation or surrounding inflammatory changes. Spleen: Normal in size without focal abnormality. Adrenals/Urinary Tract: Adrenal glands are unremarkable. No urolithiasis or hydronephrosis. Bladder is minimally distended and otherwise grossly unremarkable. Stomach/Bowel: Stomach is within normal limits. Appendix appears normal. No evidence of bowel wall thickening, distention, or inflammatory changes. Vascular/Lymphatic: Abdominal aorta is normal in caliber with scattered atherosclerotic calcification. No enlarged abdominal or pelvic lymph nodes. Reproductive: Prostate is enlarged measuring up to 5.4 cm diameter, similar to the prior exam. Other: No abdominal wall hernia or abnormality. No abdominopelvic ascites. No free air. Musculoskeletal: No acute osseous abnormality. No suspicious osseous lesion. Status post PLIF L4-S1. Multilevel degenerative changes of the thoracolumbar spine. IMPRESSION: 1. No acute localizing findings in the abdomen or pelvis. 2. Mild prostatomegaly. 3.  Aortic Atherosclerosis (ICD10-I70.0). Electronically Signed   By: Harrietta Sherry M.D.   On: 01/01/2024 15:50   DG Chest Port 1 View Result Date: 01/01/2024 CLINICAL DATA:  Nausea, vomiting, and diarrhea.  Possible sepsis. EXAM: PORTABLE CHEST 1 VIEW COMPARISON:  10/31/2011 FINDINGS: Thoracic spondylosis. Cardiac and mediastinal margins appear normal. The lungs appear clear. No blunting of the costophrenic angles. Moderate degenerative glenohumeral arthropathy bilaterally.  IMPRESSION: 1. No acute findings. 2. Thoracic spondylosis. 3. Moderate degenerative glenohumeral arthropathy bilaterally. Electronically Signed   By: Ryan Salvage M.D.   On: 01/01/2024 14:36    Labs: BMET Recent Labs  Lab 01/01/24 1410 01/01/24 1440 01/01/24 2043 01/01/24 2311 01/02/24 0452 01/02/24 1055 01/02/24 1553 01/03/24 0422  NA 127* 121*  121* 131* 132* 134* 135 135 143  K 5.0 4.6  4.7 4.0 3.7 3.7 3.8 3.5 4.5  CL 83* 91* 91* 91* 92* 93* 92* 100  CO2 <7*  --  <7* <7* <7* 8* 12* 13*  GLUCOSE 393* 395* 222* 192* 119* 154* 202* 311*  BUN 85* 93* 84* 83* 74* 60* 48* 33*  CREATININE 12.21* 12.20* 11.26* 10.60* 9.15* 7.09* 5.58* 3.23*  CALCIUM  7.6*  --  7.7* 7.2* 7.6* 6.9* 7.2* 7.2*  PHOS  --   --  >30.0*  --  10.3*  --  5.6* 5.0*   CBC Recent Labs  Lab 01/01/24 1410 01/01/24 1440 01/01/24 2041 01/02/24 0451 01/02/24 0649 01/02/24 1055 01/02/24 1429  WBC 16.4*  --  13.8* 8.9  --   --   --   NEUTROABS 12.5*  --   --  7.7  --   --   --   HGB 12.7*   < > 12.5* 13.0 11.6* 11.9* 11.9*  HCT 39.7   < > 40.1 39.9 34.9* 35.6* 34.4*  MCV 76.8*  --  78.2* 74.2*  --   --   --   PLT 406*  --  381 259  --   --   --    < > = values in this interval not displayed.    Medications:     (feeding supplement) PROSource Plus  30 mL Oral TID BM   Chlorhexidine  Gluconate Cloth  6 each Topical Daily   feeding supplement  1 Container Oral TID BM   insulin  aspart  0-6 Units Subcutaneous TID WC   insulin  glargine-yfgn  12 Units Subcutaneous Daily   multivitamin  1 tablet Oral QHS   Gordy Blanch, MD 01/03/2024, 7:38 AM

## 2024-01-03 NOTE — Progress Notes (Signed)
 PHARMACY - ANTICOAGULATION CONSULT NOTE  Pharmacy Consult for heparin  Indication: atrial fibrillation  Allergies  Allergen Reactions   Cymbalta [Duloxetine Hcl] Nausea And Vomiting and Other (See Comments)    Dizziness Headaches    Shrimp [Shellfish Allergy] Nausea And Vomiting   Valium  [Diazepam ] Itching    Patient Measurements: Height: 6' (182.9 cm) Weight: 97.7 kg (215 lb 6.2 oz) IBW/kg (Calculated) : 77.6 HEPARIN  DW (KG): 99.3  Vital Signs: Temp: 98.4 F (36.9 C) (07/24 1730) Temp Source: Bladder (07/24 1600) BP: 127/51 (07/24 1730) Pulse Rate: 72 (07/24 1730)  Labs: Recent Labs    01/01/24 1410 01/01/24 1440 01/01/24 2041 01/01/24 2043 01/02/24 0451 01/02/24 0452 01/02/24 1055 01/02/24 1429 01/02/24 1553 01/02/24 2049 01/03/24 0421 01/03/24 0422 01/03/24 1612  HGB 12.7*   < > 12.5*  --  13.0   < > 11.9* 11.9*  --   --   --   --  10.4*  HCT 39.7   < > 40.1  --  39.9   < > 35.6* 34.4*  --   --   --   --  31.0*  PLT 406*  --  381  --  259  --   --   --   --   --   --   --  174  APTT  --   --   --   --   --   --   --   --   --  93* 98*  --   --   LABPROT 17.3*  --   --   --   --   --   --   --   --   --   --   --   --   INR 1.3*  --   --   --   --   --   --   --   --   --   --   --   --   HEPARINUNFRC  --   --   --   --   --   --   --   --   --  0.70  --  0.85* 0.40  CREATININE 12.21*   < >  --    < >  --    < > 7.09*  --  5.58*  --   --  3.23*  --    < > = values in this interval not displayed.    Estimated Creatinine Clearance: 27.2 mL/min (A) (by C-G formula based on SCr of 3.23 mg/dL (H)).   Medical History: Past Medical History:  Diagnosis Date   Arthritis    Chronic back pain    on chronic opioids   Diabetes mellitus without complication (HCC)    GERD (gastroesophageal reflux disease)    HLD (hyperlipidemia)    Hypertension    no meds   Vitamin D  deficiency     Assessment: 26 yoM with new onset atrial fibrillation in the ICU. No oral  anticoagulation prior to admission. Pharmacy consulted to manage heparin  infusion. Patient started on CRRT overnight and heparin  added this morning. RN will stop heparin  with CRRT once systemic heparin  is initiated. Noted Fecal occult (+), care team aware and felt this was likely due to his GI illness and diarrhea.  7/24 PM: Heparin  level 0.40- therapeutic on 1200 units/hr. No signs of bleeding or issues with the heparin  infusion noted.    Goal of Therapy:  Heparin  level 0.3-0.7 units/ml Monitor  platelets by anticoagulation protocol: Yes   Plan:  Continue heparin  infusion at 1200 units/hour  Daily cbc and heparin  level  Continue to monitor H&H and platelets  Massie Fila, PharmD Clinical Pharmacist  01/03/2024 5:56 PM

## 2024-01-03 NOTE — Progress Notes (Signed)
 PHARMACY - ANTICOAGULATION CONSULT NOTE  Pharmacy Consult for heparin  Indication: atrial fibrillation  Allergies  Allergen Reactions   Cymbalta [Duloxetine Hcl] Nausea And Vomiting and Other (See Comments)    Dizziness/headaches   Shrimp [Shellfish Allergy]     intolerant   Valium  [Diazepam ] Itching    Patient Measurements: Height: 6' (182.9 cm) Weight: 97.7 kg (215 lb 6.2 oz) IBW/kg (Calculated) : 77.6 HEPARIN  DW (KG): 99.3  Vital Signs: Temp: 98.8 F (37.1 C) (07/24 0700) Temp Source: Bladder (07/24 0400) BP: 131/92 (07/24 0700) Pulse Rate: 77 (07/24 0700)  Labs: Recent Labs    01/01/24 1410 01/01/24 1440 01/01/24 2041 01/01/24 2043 01/02/24 0451 01/02/24 0452 01/02/24 0649 01/02/24 1055 01/02/24 1429 01/02/24 1553 01/02/24 2049 01/03/24 0421 01/03/24 0422  HGB 12.7*   < > 12.5*  --  13.0  --  11.6* 11.9* 11.9*  --   --   --   --   HCT 39.7   < > 40.1  --  39.9  --  34.9* 35.6* 34.4*  --   --   --   --   PLT 406*  --  381  --  259  --   --   --   --   --   --   --   --   APTT  --   --   --   --   --   --   --   --   --   --  93* 98*  --   LABPROT 17.3*  --   --   --   --   --   --   --   --   --   --   --   --   INR 1.3*  --   --   --   --   --   --   --   --   --   --   --   --   HEPARINUNFRC  --   --   --   --   --   --   --   --   --   --  0.70  --  0.85*  CREATININE 12.21*   < >  --    < >  --    < >  --  7.09*  --  5.58*  --   --  3.23*   < > = values in this interval not displayed.    Estimated Creatinine Clearance: 27.2 mL/min (A) (by C-G formula based on SCr of 3.23 mg/dL (H)).   Medical History: Past Medical History:  Diagnosis Date   Arthritis    Chronic back pain    on chronic opioids   Diabetes mellitus without complication (HCC)    GERD (gastroesophageal reflux disease)    HLD (hyperlipidemia)    Hypertension    no meds   Vitamin D  deficiency     Assessment: 84 yoM with new onset atrial fibrillation in the ICU. No oral  anticoagulation prior to admission. Pharmacy consulted to manage heparin  infusion. Patient started on CRRT overnight and heparin  added this morning. RN will stop heparin  with CRRT once systemic heparin  is initiated. Noted Fecal occult (+), care team aware and felt this was likely due to his GI illness and diarrhea.  Heparin  level 0.85 above goal on 1400 units/hr. Patient continues to have loose bowels, but no signs of blood per RN.    Goal of Therapy:  Heparin  level  0.3-0.7 units/ml Monitor platelets by anticoagulation protocol: Yes   Plan:  Decrease heparin  infusion to 1200 units/hr Daily cbc and heparin  level  Continue to monitor H&H and platelets   Koren Or, PharmD Clinical Pharmacist 01/03/2024 7:38 AM Please check AMION for all [facility] Pharmacy numbers

## 2024-01-04 DIAGNOSIS — A419 Sepsis, unspecified organism: Secondary | ICD-10-CM

## 2024-01-04 DIAGNOSIS — E1110 Type 2 diabetes mellitus with ketoacidosis without coma: Secondary | ICD-10-CM

## 2024-01-04 LAB — BASIC METABOLIC PANEL WITH GFR
Anion gap: 26 — ABNORMAL HIGH (ref 5–15)
BUN: 21 mg/dL (ref 8–23)
CO2: 20 mmol/L — ABNORMAL LOW (ref 22–32)
Calcium: 7.5 mg/dL — ABNORMAL LOW (ref 8.9–10.3)
Chloride: 90 mmol/L — ABNORMAL LOW (ref 98–111)
Creatinine, Ser: 1.49 mg/dL — ABNORMAL HIGH (ref 0.61–1.24)
GFR, Estimated: 51 mL/min — ABNORMAL LOW (ref >60)
Glucose, Bld: 285 mg/dL — ABNORMAL HIGH (ref 70–99)
Potassium: 3.6 mmol/L (ref 3.5–5.1)
Sodium: 136 mmol/L (ref 135–145)

## 2024-01-04 LAB — RENAL FUNCTION PANEL
Albumin: 2 g/dL — ABNORMAL LOW (ref 3.5–5.0)
Albumin: 1.9 g/dL — ABNORMAL LOW (ref 3.5–5.0)
Anion gap: 24 — ABNORMAL HIGH (ref 5–15)
Anion gap: 21 — ABNORMAL HIGH (ref 5–15)
BUN: 21 mg/dL (ref 8–23)
BUN: 22 mg/dL (ref 8–23)
CO2: 21 mmol/L — ABNORMAL LOW (ref 22–32)
CO2: 21 mmol/L — ABNORMAL LOW (ref 22–32)
Calcium: 7.5 mg/dL — ABNORMAL LOW (ref 8.9–10.3)
Calcium: 7.7 mg/dL — ABNORMAL LOW (ref 8.9–10.3)
Chloride: 88 mmol/L — ABNORMAL LOW (ref 98–111)
Chloride: 92 mmol/L — ABNORMAL LOW (ref 98–111)
Creatinine, Ser: 1.49 mg/dL — ABNORMAL HIGH (ref 0.61–1.24)
Creatinine, Ser: 1.51 mg/dL — ABNORMAL HIGH (ref 0.61–1.24)
GFR, Estimated: 51 mL/min — ABNORMAL LOW (ref >60)
GFR, Estimated: 51 mL/min — ABNORMAL LOW (ref >60)
Glucose, Bld: 283 mg/dL — ABNORMAL HIGH (ref 70–99)
Glucose, Bld: 289 mg/dL — ABNORMAL HIGH (ref 70–99)
Phosphorus: 3 mg/dL (ref 2.5–4.6)
Phosphorus: 2.7 mg/dL (ref 2.5–4.6)
Potassium: 3.5 mmol/L (ref 3.5–5.1)
Potassium: 3.2 mmol/L — ABNORMAL LOW (ref 3.5–5.1)
Sodium: 133 mmol/L — ABNORMAL LOW (ref 135–145)
Sodium: 134 mmol/L — ABNORMAL LOW (ref 135–145)

## 2024-01-04 LAB — CBC
HCT: 30.7 % — ABNORMAL LOW (ref 39.0–52.0)
Hemoglobin: 10.2 g/dL — ABNORMAL LOW (ref 13.0–17.0)
MCH: 24.1 pg — ABNORMAL LOW (ref 26.0–34.0)
MCHC: 33.2 g/dL (ref 30.0–36.0)
MCV: 72.4 fL — ABNORMAL LOW (ref 80.0–100.0)
Platelets: 172 K/uL (ref 150–400)
RBC: 4.24 MIL/uL (ref 4.22–5.81)
RDW: 16.9 % — ABNORMAL HIGH (ref 11.5–15.5)
WBC: 14.9 K/uL — ABNORMAL HIGH (ref 4.0–10.5)
nRBC: 0 % (ref 0.0–0.2)

## 2024-01-04 LAB — MAGNESIUM: Magnesium: 2.6 mg/dL — ABNORMAL HIGH (ref 1.7–2.4)

## 2024-01-04 LAB — GLUCOSE, CAPILLARY
Glucose-Capillary: 275 mg/dL — ABNORMAL HIGH (ref 70–99)
Glucose-Capillary: 212 mg/dL — ABNORMAL HIGH (ref 70–99)
Glucose-Capillary: 216 mg/dL — ABNORMAL HIGH (ref 70–99)
Glucose-Capillary: 276 mg/dL — ABNORMAL HIGH (ref 70–99)
Glucose-Capillary: 339 mg/dL — ABNORMAL HIGH (ref 70–99)
Glucose-Capillary: 353 mg/dL — ABNORMAL HIGH (ref 70–99)

## 2024-01-04 LAB — HEPARIN LEVEL (UNFRACTIONATED): Heparin Unfractionated: 0.29 [IU]/mL — ABNORMAL LOW (ref 0.30–0.70)

## 2024-01-04 LAB — APTT: aPTT: 47 s — ABNORMAL HIGH (ref 24–36)

## 2024-01-04 MED ORDER — LACTATED RINGERS IV BOLUS
1000.0000 mL | Freq: Once | INTRAVENOUS | Status: AC
  Administered 2024-01-04: 1000 mL via INTRAVENOUS

## 2024-01-04 MED ORDER — POTASSIUM CHLORIDE CRYS ER 20 MEQ PO TBCR
40.0000 meq | EXTENDED_RELEASE_TABLET | Freq: Once | ORAL | Status: AC
  Administered 2024-01-04: 40 meq via ORAL
  Filled 2024-01-04: qty 2

## 2024-01-04 MED ORDER — HEPARIN SODIUM (PORCINE) 1000 UNIT/ML DIALYSIS
1000.0000 [IU] | INTRAMUSCULAR | Status: DC | PRN
  Administered 2024-01-04: 3000 [IU] via INTRAVENOUS_CENTRAL
  Filled 2024-01-04: qty 3
  Filled 2024-01-04: qty 6

## 2024-01-04 MED ORDER — INSULIN GLARGINE-YFGN 100 UNIT/ML ~~LOC~~ SOLN
25.0000 [IU] | Freq: Every day | SUBCUTANEOUS | Status: DC
  Administered 2024-01-04 – 2024-01-05 (×2): 25 [IU] via SUBCUTANEOUS
  Filled 2024-01-04 (×2): qty 0.25

## 2024-01-04 MED ORDER — ENOXAPARIN SODIUM 40 MG/0.4ML IJ SOSY
40.0000 mg | PREFILLED_SYRINGE | INTRAMUSCULAR | Status: DC
  Administered 2024-01-04 – 2024-01-09 (×6): 40 mg via SUBCUTANEOUS
  Filled 2024-01-04 (×6): qty 0.4

## 2024-01-04 MED ORDER — HEPARIN SODIUM (PORCINE) 1000 UNIT/ML IJ SOLN: 3000.0000 [IU] | Freq: Once | INTRAMUSCULAR | Status: DC

## 2024-01-04 NOTE — TOC Progression Note (Addendum)
 Transition of Care [facility]) - Progression Note    Patient Details  Name: Riley Clarke MRN: [id number] Date of Birth: [birth year]  Transition of [facility]) CM/SW Contact  Corean JAYSON Canary, RN Phone Number: 01/04/2024, 2:51 PM  Clinical Narrative:    CRRT stopping today, still on low dose pressers vut improving.  PT consult in, awaiting eval and recommendations.    Expected Discharge Plan:  (TBD) Barriers to Discharge: Continued Medical Work up               Expected Discharge Plan and Services                                               Social Drivers of Health (SDOH) Interventions SDOH Screenings   Food Insecurity: Patient Declined (12/25/2023)  Housing: Unknown (12/25/2023)  Transportation Needs: No Transportation Needs (12/25/2023)  Utilities: Not At Risk (08/10/2022)  Alcohol Screen: Low Risk  (12/25/2023)  Depression (PHQ2-9): Low Risk  (12/27/2023)  Financial Resource Strain: Low Risk  (12/25/2023)  Physical Activity: Inactive (12/25/2023)  Social Connections: Unknown (12/25/2023)  Stress: No Stress Concern Present (12/25/2023)  Tobacco Use: Medium Risk (12/27/2023)    Readmission Risk Interventions     No data to display

## 2024-01-04 NOTE — Plan of Care (Signed)
  Problem: Education: Goal: Ability to describe self-care measures that may prevent or decrease complications (Diabetes Survival Skills Education) will improve Outcome: Progressing   Problem: Education: Goal: Knowledge of General Education information will improve Description: Including pain rating scale, medication(s)/side effects and non-pharmacologic comfort measures Outcome: Progressing   Problem: Clinical Measurements: Goal: Ability to maintain clinical measurements within normal limits will improve Outcome: Progressing   Problem: Pain Managment: Goal: General experience of comfort will improve and/or be controlled Outcome: Progressing   Problem: Skin Integrity: Goal: Risk for impaired skin integrity will decrease Outcome: Progressing

## 2024-01-04 NOTE — Progress Notes (Signed)
 Patient ID: Riley Clarke, male   DOB: 04/11/1956, 68 y.o.   MRN: [id number] [facility] Progress Note   Assessment/ Plan:   1.  Acute kidney injury: Nonoliguric overnight.  Appears to be hemodynamically mediated with preceding excessive GI losses and ongoing ACE inhibitor/thiazide diuretic as well as Jardiance .  Likely prerenal +/- evolution to ischemic ATN.  Started on CRRT 7/22 after persistent metabolic acidosis refractory to medical management.  I will discontinue CRRT this morning with increasing urine output and permissive labs.  Will follow labs to determine additional management. 2.  Metabolic acidosis/acidemia: Awaiting basic metabolic panel but data largely pointing to metabolic acidosis likely arising from lactic acidosis (metformin  use/hypoperfusion) and combination of DKA as well as acute kidney injury.  Corrected with CRRT/bicarbonate. 3.  Diabetic ketoacidosis: Started on aggressive fluid replacement as well as insulin  therapy for glycemic management.  Continue glycemic management per primary service. 4.  Hyponatremia: Hypovolemic hyponatremia compounded by pseudohyponatremia from hyperglycemia.  Improved with glycemic control/fluid, monitor off CRRT. 5.  Shock: Circulatory collapse likely related to his metabolic acidemia.  Still requiring low-dose pressors and intermittent fluid boluses, monitor off of CRRT.  Subjective:   Some episodes of hypotension overnight prompting fluid bolus administration.   Objective:   BP (!) 92/45   Pulse 75   Temp 97.7 F (36.5 C)   Resp 18   Ht 6' (1.829 m)   Wt 99.2 kg   SpO2 100%   BMI 29.66 kg/m   Intake/Output Summary (Last 24 hours) at 01/04/2024 0803 Last data filed at 01/04/2024 0800 Gross per 24 hour  Intake 5328.06 ml  Output 5515 ml  Net -186.94 ml   Weight change: 1.5 kg  Physical Exam: Gen: Resting comfortably in bed, awake/alert.  On CRRT CVS: Pulse regular rhythm, normal rate, S1-S2 Resp: Anteriorly  clear to auscultation, no rales/rhonchi.   Abd: Soft, flat, mild tenderness over epigastric area, bowel sounds normal Ext: No lower extremity edema  Imaging: ECHOCARDIOGRAM COMPLETE Result Date: 01/02/2024    ECHOCARDIOGRAM REPORT   Patient Name:   Riley Clarke Date of Exam: 01/02/2024 Medical Rec #:  [id number]        Height:       72.0 in Accession #:    [id number]       Weight:       228.4 lb Date of Birth:  12/06/1955        BSA:          2.254 m Patient Age:    68 years         BP:           114/47 mmHg Patient Gender: M                HR:           80 bpm. Exam Location:  Inpatient Procedure: 2D Echo, Cardiac Doppler, Color Doppler and Intracardiac            Opacification Agent (Both Spectral and Color Flow Doppler were            utilized during procedure). STAT ECHO Indications:    Atrial Fibrillation  History:        Patient has no prior history of Echocardiogram examinations.                 Risk Factors:Diabetes, Former Smoker, Hypertension and                 Dyslipidemia.  Sonographer:    Koleen Popper RDCS Referring Phys: [id number] [place]ORONKWO U OGAN IMPRESSIONS  1. Left ventricular ejection fraction, by estimation, is 60 to 65%. The left ventricle has normal function. The left ventricle has no regional wall motion abnormalities. Left ventricular diastolic parameters were normal.  2. Right ventricular systolic function is moderately reduced. The right ventricular size is mildly enlarged. There is normal pulmonary artery systolic pressure.  3. The mitral valve is normal in structure. No evidence of mitral valve regurgitation. No evidence of mitral stenosis.  4. The aortic valve is normal in structure. Aortic valve regurgitation is not visualized. No aortic stenosis is present.  5. The inferior vena cava is normal in size with greater than 50% respiratory variability, suggesting right atrial pressure of 3 mmHg. FINDINGS  Left Ventricle: Left ventricular ejection fraction, by estimation, is 60 to  65%. The left ventricle has normal function. The left ventricle has no regional wall motion abnormalities. Definity  contrast agent was given IV to delineate the left ventricular  endocardial borders. The left ventricular internal cavity size was normal in size. There is no left ventricular hypertrophy. Left ventricular diastolic function could not be evaluated due to atrial fibrillation. Left ventricular diastolic parameters were normal. Right Ventricle: The right ventricular size is mildly enlarged. No increase in right ventricular wall thickness. Right ventricular systolic function is moderately reduced. There is normal pulmonary artery systolic pressure. The tricuspid regurgitant velocity is 2.24 m/s, and with an assumed right atrial pressure of 3 mmHg, the estimated right ventricular systolic pressure is 23.1 mmHg. Left Atrium: Left atrial size was normal in size. Right Atrium: Right atrial size was normal in size. Pericardium: There is no evidence of pericardial effusion. Mitral Valve: The mitral valve is normal in structure. No evidence of mitral valve regurgitation. No evidence of mitral valve stenosis. Tricuspid Valve: The tricuspid valve is normal in structure. Tricuspid valve regurgitation is trivial. No evidence of tricuspid stenosis. Aortic Valve: The aortic valve is normal in structure. Aortic valve regurgitation is not visualized. No aortic stenosis is present. Pulmonic Valve: The pulmonic valve was normal in structure. Pulmonic valve regurgitation is not visualized. No evidence of pulmonic stenosis. Aorta: The aortic root is normal in size and structure. Venous: The inferior vena cava is normal in size with greater than 50% respiratory variability, suggesting right atrial pressure of 3 mmHg. IAS/Shunts: No atrial level shunt detected by color flow Doppler.  LEFT VENTRICLE PLAX 2D LVIDd:         4.30 cm   Diastology LVIDs:         2.50 cm   LV e' medial:    6.31 cm/s LV PW:         1.20 cm   LV E/e'  medial:  11.8 LV IVS:        1.10 cm   LV e' lateral:   6.96 cm/s LVOT diam:     2.10 cm   LV E/e' lateral: 10.7 LVOT Area:     3.46 cm  IVC IVC diam: 1.90 cm LEFT ATRIUM             Index LA diam:        3.20 cm 1.42 cm/m LA Vol (A2C):   56.2 ml 24.93 ml/m LA Vol (A4C):   64.9 ml 28.79 ml/m LA Biplane Vol: 60.6 ml 26.88 ml/m   AORTA Ao Root diam: 3.40 cm Ao Asc diam:  2.80 cm MITRAL VALVE  TRICUSPID VALVE MV Area (PHT): 3.37 cm    TR Peak grad:   20.1 mmHg MV Decel Time: 225 msec    TR Vmax:        224.00 cm/s MV E velocity: 74.40 cm/s MV A velocity: 53.90 cm/s  SHUNTS MV E/A ratio:  1.38        Systemic Diam: 2.10 cm Morene Brownie Electronically signed by Morene Brownie Signature Date/Time: 01/02/2024/4:22:18 PM    Final     Labs: BMET Recent Labs  Lab 01/01/24 2043 01/01/24 2311 01/02/24 0452 01/02/24 1055 01/02/24 1553 01/03/24 0422 01/03/24 1612 01/04/24 0324  NA 131* 132* 134* 135 135 143 134* 133*  136  K 4.0 3.7 3.7 3.8 3.5 4.5 4.5 3.5  3.6  CL 91* 91* 92* 93* 92* 100 92* 88*  90*  CO2 <7* <7* <7* 8* 12* 13* 16* 21*  20*  GLUCOSE 222* 192* 119* 154* 202* 311* 261* 283*  285*  BUN 84* 83* 74* 60* 48* 33* 28* 21  21  CREATININE 11.26* 10.60* 9.15* 7.09* 5.58* 3.23* 2.08* 1.49*  1.49*  CALCIUM  7.7* 7.2* 7.6* 6.9* 7.2* 7.2* 6.9* 7.5*  7.5*  PHOS >30.0*  --  10.3*  --  5.6* 5.0* 3.7 3.0   CBC Recent Labs  Lab 01/01/24 1410 01/01/24 1440 01/01/24 2041 01/02/24 0451 01/02/24 0649 01/02/24 1055 01/02/24 1429 01/03/24 1612 01/04/24 0324  WBC 16.4*  --  13.8* 8.9  --   --   --  18.2* 14.9*  NEUTROABS 12.5*  --   --  7.7  --   --   --   --   --   HGB 12.7*   < > 12.5* 13.0   < > 11.9* 11.9* 10.4* 10.2*  HCT 39.7   < > 40.1 39.9   < > 35.6* 34.4* 31.0* 30.7*  MCV 76.8*  --  78.2* 74.2*  --   --   --  72.9* 72.4*  PLT 406*  --  381 259  --   --   --  174 172   < > = values in this interval not displayed.    Medications:     (feeding supplement)  PROSource Plus  30 mL Oral TID BM   Chlorhexidine  Gluconate Cloth  6 each Topical Daily   feeding supplement  1 Container Oral TID BM   insulin  aspart  0-5 Units Subcutaneous QHS   insulin  aspart  0-9 Units Subcutaneous TID WC   insulin  glargine-yfgn  16 Units Subcutaneous Daily   multivitamin  1 tablet Oral QHS   Gordy Blanch, MD 01/04/2024, 8:03 AM

## 2024-01-04 NOTE — Progress Notes (Addendum)
 NAME:  Riley Clarke, MRN:  [id number], DOB:  Sep 05, 1955, LOS: 3 ADMISSION DATE:  01/01/2024  History of Present Illness:  Pt is a with significant pmhx of diabetes 2, HLD, HTN, Chronic Back pain, and Arthritis who presents by EMS from home with N/V/D for approxmiately the 7 days and abdominal pain. Notes persistent diarrhea for the last week but just 3 episodes of vomiting. Also decreased to no urine output. No sick contacts. Patient states that patient had a mounjaro  injection last Monday 12/24/23, which he contributes to the abdominal pain. He was also still taking Metformin , lisinopril /hydrochlorothiazide , and Jardiance .    Initial Istat labs revealed- metabolic acidosis- venous pH 6.963, lactic acid of 10.7, creatinine of 12.20, glucose of 395, BUN of 93. CBC 16.4, HCT 39, and platelets 406. Concern for DKA and Renal Failure. Upon initial assessment of patient, patient having kussmaul respirations, tachycardia- HR 100s, BP within normal limits, O2 sat 99% on RA. PCCM consulted for admitted due to metabolic derangements along with Nephro consult.   Pertinent  Medical History  Arthritis Chronic back pain on chronic opioids T2DM GERD HLD HTN Vit D Deficiency  Significant Hospital Events: Including procedures, antibiotic start and stop dates in addition to other pertinent events   7/22 admitted to [facility], CRRT started due to worsening metabolic acidosis  7/23 GIPP positive salmonella, EPEC, ETEC. Ceftriaxone  started  Interim History / Subjective:  Still having diarrhea but pain improved  Objective    Blood pressure (!) 123/57, pulse 77, temperature 97.7 F (36.5 C), resp. rate 17, height 6' (1.829 m), weight 99.2 kg, SpO2 98%. CVP:  [1 mmHg-3 mmHg] 2 mmHg      Intake/Output Summary (Last 24 hours) at 01/04/2024 9093 Last data filed at 01/04/2024 0800 Gross per 24 hour  Intake 5277.59 ml  Output 5330 ml  Net -52.41 ml   Filed Weights   01/02/24 0500 01/03/24 0500 01/04/24 0500   Weight: 103.6 kg 97.7 kg 99.2 kg    Examination: General: resting comfortably in bed, no acute distress Cardiovascular: RRR, no m/r/g Lungs: CTAB, breathing comfortably on RA Abdomen: Soft, non-distended, non-tender Neuro: A&Ox4  Resolved problem list  Afib with RVR - s/p amiodarone  gtt Assessment and Plan  Metabolic Acidosis Lactic Acidosis DKA, hx T2DM Shock Metabolic acidosis likely secondary to metformin  induced lactic acidosis, AKI, and DKA - increase to 25U basal insulin , very sensitive SSI - Vasopressin  gtt - wean as tolerated for MAP >65 - monitor BMP - bolus IVF PRN - consider albumin    Salmonella, ETEC, EPEC - Continue Ceftriaxone  (7/23-) 10-14 day course - Supportive care  - Enteric precautions    Acute Renal Failure Suspected pre-renal secondary to GI losses, ACE-I/thiazide use, Jardiance  use - Nephrology following - D/C CRRT today    Chronic back pain - tylenol  q6h PRN mild pain - oxycodone  5mg  q6h PRN severe pain - avoid nephrotoxic medications  Best Practice (right click and Reselect all SmartList Selections daily)   Diet/type: Regular consistency (see orders) DVT prophylaxis SCD Pressure ulcer(s): N/A GI prophylaxis: N/A Lines: Dialysis Catheter Foley:  N/A  Code Status:  full code Last date of multidisciplinary goals of care discussion []   Labs   CBC: Recent Labs  Lab 01/01/24 1410 01/01/24 1440 01/01/24 2041 01/02/24 0451 01/02/24 0649 01/02/24 1055 01/02/24 1429 01/03/24 1612 01/04/24 0324  WBC 16.4*  --  13.8* 8.9  --   --   --  18.2* 14.9*  NEUTROABS 12.5*  --   --  7.7  --   --   --   --   --   HGB 12.7*   < > 12.5* 13.0 11.6* 11.9* 11.9* 10.4* 10.2*  HCT 39.7   < > 40.1 39.9 34.9* 35.6* 34.4* 31.0* 30.7*  MCV 76.8*  --  78.2* 74.2*  --   --   --  72.9* 72.4*  PLT 406*  --  381 259  --   --   --  174 172   < > = values in this interval not displayed.    Basic Metabolic Panel: Recent Labs  Lab 01/01/24 2043  01/01/24 2311 01/02/24 0451 01/02/24 0452 01/02/24 1055 01/02/24 1553 01/03/24 0421 01/03/24 0422 01/03/24 1612 01/04/24 0324  NA 131*   < >  --  134* 135 135  --  143 134* 133*  136  K 4.0   < >  --  3.7 3.8 3.5  --  4.5 4.5 3.5  3.6  CL 91*   < >  --  92* 93* 92*  --  100 92* 88*  90*  CO2 <7*   < >  --  <7* 8* 12*  --  13* 16* 21*  20*  GLUCOSE 222*   < >  --  119* 154* 202*  --  311* 261* 283*  285*  BUN 84*   < >  --  74* 60* 48*  --  33* 28* 21  21  CREATININE 11.26*   < >  --  9.15* 7.09* 5.58*  --  3.23* 2.08* 1.49*  1.49*  CALCIUM  7.7*   < >  --  7.6* 6.9* 7.2*  --  7.2* 6.9* 7.5*  7.5*  MG 2.9*  --  2.3  --   --   --  2.4  --   --  2.6*  PHOS >30.0*  --   --  10.3*  --  5.6*  --  5.0* 3.7 3.0   < > = values in this interval not displayed.   GFR: Estimated Creatinine Clearance: 59.5 mL/min (A) (by C-G formula based on SCr of 1.49 mg/dL (H)). Recent Labs  Lab 01/01/24 1440 01/01/24 2041 01/01/24 2043 01/02/24 0451 01/03/24 1612 01/04/24 0324  WBC  --  13.8*  --  8.9 18.2* 14.9*  LATICACIDVEN 10.7*  --  >9.0*  --   --   --     Liver Function Tests: Recent Labs  Lab 01/01/24 1410 01/01/24 2043 01/02/24 0452 01/02/24 1553 01/03/24 0422 01/03/24 1612 01/04/24 0324  AST 34 39  --   --   --   --   --   ALT 21 23  --   --   --   --   --   ALKPHOS 117 129*  --   --   --   --   --   BILITOT 0.5 0.7  --   --   --   --   --   PROT 6.4* 6.1*  --   --   --   --   --   ALBUMIN 2.5* 2.4* 2.4* 2.0* 2.0* 1.9* 2.0*   Recent Labs  Lab 01/01/24 1410  LIPASE 69*   No results for input(s): AMMONIA in the last 168 hours.  ABG    Component Value Date/Time   PHART 7.34 (L) 01/02/2024 0843   PCO2ART <18 (LL) 01/02/2024 0843   PO2ART 115 (H) 01/02/2024 0843   HCO3 6.5 (L) 01/02/2024 0843   TCO2 <  5 (L) 01/01/2024 1440   TCO2 7 (L) 01/01/2024 1440   ACIDBASEDEF 16.1 (H) 01/02/2024 0843   O2SAT 99.2 01/02/2024 0843     Coagulation Profile: Recent Labs   Lab 01/01/24 1410  INR 1.3*    Cardiac Enzymes: No results for input(s): CKTOTAL, CKMB, CKMBINDEX, TROPONINI in the last 168 hours.  HbA1C: Hemoglobin A1C  Date/Time Value Ref Range Status  10/15/2023 02:34 PM 10.3 (A) 4.0 - 5.6 % Final  04/16/2023 03:43 PM 8.1 (A) 4.0 - 5.6 % Final   HbA1c, POC (prediabetic range)  Date/Time Value Ref Range Status  01/12/2022 02:44 PM 7.2 (A) 5.7 - 6.4 % Final  10/12/2021 03:18 PM 9.9 (A) 5.7 - 6.4 % Final   HbA1c, POC (controlled diabetic range)  Date/Time Value Ref Range Status  01/12/2023 03:04 PM 10.1 (A) 0.0 - 7.0 % Final  01/12/2022 02:44 PM 7.2 (A) 0.0 - 7.0 % Final   HbA1c POC (<> result, manual entry)  Date/Time Value Ref Range Status  01/12/2022 02:44 PM 7.2 4.0 - 5.6 % Final   Hgb A1c MFr Bld  Date/Time Value Ref Range Status  08/31/2022 01:30 PM 8.6 (H) 4.8 - 5.6 % Final    Comment:    (NOTE)         Prediabetes: 5.7 - 6.4         Diabetes: >6.4         Glycemic control for adults with diabetes: <7.0     CBG: Recent Labs  Lab 01/03/24 1536 01/03/24 2213 01/03/24 2317 01/04/24 0312 01/04/24 0751  GLUCAP 236* 251* 280* 275* 212*    Review of Systems:   Diarrhea  Past Medical History:  He,  has a past medical history of Arthritis, Chronic back pain, Diabetes mellitus without complication (HCC), GERD (gastroesophageal reflux disease), HLD (hyperlipidemia), Hypertension, and Vitamin D  deficiency.   Surgical History:   Past Surgical History:  Procedure Laterality Date   ANTERIOR CERVICAL DECOMP/DISCECTOMY FUSION N/A 09/05/2022   Procedure: Anterior Cervical Decompression Fusion  - Cervical three-Cervical four;  Surgeon: Louis Shove, MD;  Location: [facility] OR;  Service: Neurosurgery;  Laterality: N/A;   CERVICAL DISC SURGERY  99   LUMBAR FUSION     LUMBAR LAMINECTOMY/DECOMPRESSION MICRODISCECTOMY Bilateral 03/09/2021   Procedure: Laminectomy and Foraminotomy - bilateral - Lumbar two-Lumbar three;  Surgeon:  Louis Shove, MD;  Location: [facility] OR;  Service: Neurosurgery;  Laterality: Bilateral;   ROTATOR CUFF REPAIR Bilateral    SHOULDER ARTHROSCOPY WITH ROTATOR CUFF REPAIR AND SUBACROMIAL DECOMPRESSION Right 12/30/2018   Procedure: SHOULDER ARTHROSCOPY WITH ROTATOR CUFF REPAIR AND SUBACROMIAL DECOMPRESSION, DISTAL CLAVICLE EXICISION,;  Surgeon: Cristy Bonner DASEN, MD;  Location: [facility];  Service: Orthopedics;  Laterality: Right;   SHOULDER ARTHROSCOPY WITH SUBACROMIAL DECOMPRESSION, ROTATOR CUFF REPAIR AND BICEP TENDON REPAIR Left 06/19/2019   Procedure: LEFT SHOULDER ARTHROSCOPY, EXTENTSIVE DEBRIDEMENT, DISTAL CLAVICULECTOMY, SUBACROMIAL DECOMPRESSION, PARTIAL ACROMIOPLASTY WITH CORACOACROMIAL RELEASE, ROTATOR CUFFF REPAIR AND BICEP TENODESIS;  Surgeon: Cristy Bonner DASEN, MD;  Location: [facility];  Service: Orthopedics;  Laterality: Left;     Social History:   reports that he quit smoking about 3 years ago. His smoking use included cigarettes. He started smoking about 28 years ago. He has a 6.3 pack-year smoking history. He has never used smokeless tobacco. He reports current alcohol use of about 4.0 standard drinks of alcohol per week. He reports that he does not use drugs.   Family History:  His family history includes Diabetes in  his mother; Heart disease in his mother. There is no history of Amblyopia, Blindness, Cataracts, Hypertension, Macular degeneration, Retinal detachment, Strabismus, Esophageal cancer, Colon cancer, Pancreatic cancer, Ulcerative colitis, Inflammatory bowel disease, Liver disease, Rectal cancer, or Stomach cancer.   Allergies Allergies  Allergen Reactions   Cymbalta [Duloxetine Hcl] Nausea And Vomiting and Other (See Comments)    Dizziness Headaches    Shrimp [Shellfish Allergy] Nausea And Vomiting   Valium  [Diazepam ] Itching     Home Medications  Prior to Admission medications   Medication Sig Start Date End Date Taking? Authorizing Provider   cyclobenzaprine  (FLEXERIL ) 10 MG tablet TAKE 1 TABLET(10 MG) BY MOUTH THREE TIMES DAILY AS NEEDED FOR MUSCLE SPASMS 11/06/23  Yes [name], NP  empagliflozin  (JARDIANCE ) 25 MG TABS tablet Take 1 tablet (25 mg total) by mouth daily. 03/14/23  Yes [name], NP  esomeprazole  (NEXIUM ) 40 MG capsule TAKE 1 CAPSULE BY MOUTH EVERY MORNING BEFORE BREAKFAST Patient taking differently: Take 40 mg by mouth daily. 10/15/23  Yes [name], NP  gabapentin  (NEURONTIN ) 300 MG capsule Take 1 capsule (300 mg total) by mouth 3 (three) times daily. 04/20/23  Yes [name], NP  lisinopril -hydrochlorothiazide  (ZESTORETIC ) 20-25 MG tablet TAKE 1 TABLET BY MOUTH DAILY 10/02/23 10/01/24 Yes Nichols, [name] S, NP  metFORMIN  (GLUCOPHAGE ) 1000 MG tablet TAKE 1 TABLET(1000 MG) BY MOUTH TWICE DAILY WITH A MEAL 10/15/23  Yes Nichols, [name] S, NP  oxyCODONE -acetaminophen  (PERCOCET) 10-325 MG tablet Take 1 tablet by mouth 4 (four) times daily as needed for pain. 08/11/22  Yes [provider]  simvastatin  (ZOCOR ) 10 MG tablet TAKE 1 TABLET(10 MG) BY MOUTH DAILY 10/08/23  Yes Nichols, [name] S, NP  tirzepatide  (MOUNJARO ) 5 MG/0.5ML Pen Inject 5 mg into the skin every Monday.   Yes [provider]     Critical care time: 30 minutes

## 2024-01-05 DIAGNOSIS — E1110 Type 2 diabetes mellitus with ketoacidosis without coma: Secondary | ICD-10-CM | POA: Diagnosis not present

## 2024-01-05 DIAGNOSIS — N179 Acute kidney failure, unspecified: Secondary | ICD-10-CM | POA: Diagnosis not present

## 2024-01-05 LAB — RENAL FUNCTION PANEL
Albumin: 2 g/dL — ABNORMAL LOW (ref 3.5–5.0)
Albumin: 2.2 g/dL — ABNORMAL LOW (ref 3.5–5.0)
Anion gap: 15 (ref 5–15)
Anion gap: 12 (ref 5–15)
BUN: 19 mg/dL (ref 8–23)
BUN: 23 mg/dL (ref 8–23)
CO2: 28 mmol/L (ref 22–32)
CO2: 26 mmol/L (ref 22–32)
Calcium: 8 mg/dL — ABNORMAL LOW (ref 8.9–10.3)
Calcium: 8.3 mg/dL — ABNORMAL LOW (ref 8.9–10.3)
Chloride: 91 mmol/L — ABNORMAL LOW (ref 98–111)
Chloride: 97 mmol/L — ABNORMAL LOW (ref 98–111)
Creatinine, Ser: 1 mg/dL (ref 0.61–1.24)
Creatinine, Ser: 0.87 mg/dL (ref 0.61–1.24)
GFR, Estimated: >60 mL/min (ref >60)
GFR, Estimated: >60 mL/min (ref >60)
Glucose, Bld: 276 mg/dL — ABNORMAL HIGH (ref 70–99)
Glucose, Bld: 288 mg/dL — ABNORMAL HIGH (ref 70–99)
Phosphorus: 2.5 mg/dL (ref 2.5–4.6)
Phosphorus: 1.3 mg/dL — ABNORMAL LOW (ref 2.5–4.6)
Potassium: 3.3 mmol/L — ABNORMAL LOW (ref 3.5–5.1)
Potassium: 3.2 mmol/L — ABNORMAL LOW (ref 3.5–5.1)
Sodium: 134 mmol/L — ABNORMAL LOW (ref 135–145)
Sodium: 135 mmol/L (ref 135–145)

## 2024-01-05 LAB — CBC
HCT: 29.7 % — ABNORMAL LOW (ref 39.0–52.0)
Hemoglobin: 9.7 g/dL — ABNORMAL LOW (ref 13.0–17.0)
MCH: 24.5 pg — ABNORMAL LOW (ref 26.0–34.0)
MCHC: 32.7 g/dL (ref 30.0–36.0)
MCV: 75 fL — ABNORMAL LOW (ref 80.0–100.0)
Platelets: 130 K/uL — ABNORMAL LOW (ref 150–400)
RBC: 3.96 MIL/uL — ABNORMAL LOW (ref 4.22–5.81)
RDW: 17 % — ABNORMAL HIGH (ref 11.5–15.5)
WBC: 12.3 K/uL — ABNORMAL HIGH (ref 4.0–10.5)
nRBC: 0 % (ref 0.0–0.2)

## 2024-01-05 LAB — GLUCOSE, CAPILLARY
Glucose-Capillary: 235 mg/dL — ABNORMAL HIGH (ref 70–99)
Glucose-Capillary: 369 mg/dL — ABNORMAL HIGH (ref 70–99)
Glucose-Capillary: 370 mg/dL — ABNORMAL HIGH (ref 70–99)
Glucose-Capillary: 279 mg/dL — ABNORMAL HIGH (ref 70–99)
Glucose-Capillary: 135 mg/dL — ABNORMAL HIGH (ref 70–99)

## 2024-01-05 LAB — MAGNESIUM: Magnesium: 2.2 mg/dL (ref 1.7–2.4)

## 2024-01-05 MED ORDER — INSULIN GLARGINE-YFGN 100 UNIT/ML ~~LOC~~ SOLN
30.0000 [IU] | Freq: Every day | SUBCUTANEOUS | Status: DC
  Administered 2024-01-06 – 2024-01-10 (×5): 30 [IU] via SUBCUTANEOUS
  Filled 2024-01-05 (×5): qty 0.3

## 2024-01-05 MED ORDER — POTASSIUM CHLORIDE CRYS ER 20 MEQ PO TBCR
20.0000 meq | EXTENDED_RELEASE_TABLET | Freq: Two times a day (BID) | ORAL | Status: AC
  Administered 2024-01-05 – 2024-01-06 (×3): 20 meq via ORAL
  Filled 2024-01-05 (×3): qty 1

## 2024-01-05 MED ORDER — INSULIN ASPART 100 UNIT/ML IJ SOLN: 0.0000 [IU] | Freq: Every day | INTRAMUSCULAR | Status: DC

## 2024-01-05 MED ORDER — ORAL CARE MOUTH RINSE: 15.0000 mL | OROMUCOSAL | Status: DC | PRN

## 2024-01-05 MED ORDER — INSULIN ASPART 100 UNIT/ML IJ SOLN
0.0000 [IU] | Freq: Three times a day (TID) | INTRAMUSCULAR | Status: DC
  Administered 2024-01-05: 8 [IU] via SUBCUTANEOUS
  Administered 2024-01-05: 15 [IU] via SUBCUTANEOUS
  Administered 2024-01-06 (×2): 5 [IU] via SUBCUTANEOUS
  Administered 2024-01-06: 3 [IU] via SUBCUTANEOUS
  Administered 2024-01-07: 15 [IU] via SUBCUTANEOUS
  Administered 2024-01-07: 3 [IU] via SUBCUTANEOUS
  Administered 2024-01-07: 2 [IU] via SUBCUTANEOUS
  Administered 2024-01-08: 5 [IU] via SUBCUTANEOUS
  Administered 2024-01-08 (×2): 3 [IU] via SUBCUTANEOUS
  Administered 2024-01-09: 2 [IU] via SUBCUTANEOUS
  Administered 2024-01-09: 5 [IU] via SUBCUTANEOUS
  Administered 2024-01-09: 8 [IU] via SUBCUTANEOUS
  Administered 2024-01-10: 3 [IU] via SUBCUTANEOUS
  Administered 2024-01-10: 8 [IU] via SUBCUTANEOUS

## 2024-01-05 MED ORDER — INSULIN ASPART 100 UNIT/ML IJ SOLN
4.0000 [IU] | Freq: Three times a day (TID) | INTRAMUSCULAR | Status: DC
  Administered 2024-01-05 – 2024-01-10 (×13): 4 [IU] via SUBCUTANEOUS

## 2024-01-05 NOTE — Inpatient Diabetes Management (Signed)
 Inpatient Diabetes Program Recommendations  AACE/ADA: New Consensus Statement on Inpatient Glycemic Control (2015)  Target Ranges:  Prepandial:   less than 140 mg/dL      Peak postprandial:   less than 180 mg/dL (1-2 hours)      Critically ill patients:  140 - 180 mg/dL   Lab Results  Component Value Date   GLUCAP 235 (H) 01/05/2024   HGBA1C 10.3 (A) 10/15/2023    Review of Glycemic Control  Latest Reference Range & Units 01/04/24 15:18 01/04/24 19:27 01/04/24 21:38 01/05/24 07:41  Glucose-Capillary 70 - 99 mg/dL 723 (H) 660 (H) 646 (H) 235 (H)  (H): Data is abnormally high  Diabetes history: DM 2 Outpatient Diabetes medications: Jardiance  25 mg Daily, Metformin  1000 mg bid, Mounjaro  7.5 mg weekly Current orders for Inpatient glycemic control:  Semglee  25 units Daily, Novolog  0-9 units tid & 0-5 units QHS  Inpatient Diabetes Program Recommendations:    1-Semglee  30 units QAM (could administer 5 additional units this am as he has already received 25 units)  2-Novolog  4 units TID with meals if he consumes at least 50%  Thank you, Wyvonna Pinal, MSN, CDCES Diabetes Coordinator Inpatient Diabetes Program [phone] (team pager from 8a-5p)

## 2024-01-05 NOTE — Progress Notes (Signed)
 Patient ID: Riley Clarke, male   DOB: 1955-12-19, 68 y.o.   MRN: [id number] [facility] Progress Note   Assessment/ Plan:   1.  Acute kidney injury: Nonoliguric overnight.  Appears to be hemodynamically mediated with preceding excessive GI losses and ongoing ACE inhibitor/thiazide diuretic as well as Jardiance .  Likely prerenal +/- evolution to ischemic ATN.  On CRRT between 7/22-7/25 for management of metabolic acidosis refractory to medical management.  Now with excellent urine output and renal recovery seen on labs.  Will replace potassium. 2.  Metabolic acidosis/acidemia: Awaiting basic metabolic panel but data largely pointing to metabolic acidosis likely arising from lactic acidosis (metformin  use/hypoperfusion) and combination of DKA as well as acute kidney injury.  Corrected with CRRT/bicarbonate. 3.  Diabetic ketoacidosis: Started on aggressive fluid replacement as well as insulin  therapy for glycemic management.  Continue glycemic management per primary service. 4.  Hyponatremia: Hypovolemic hyponatremia compounded by pseudohyponatremia from hyperglycemia.  Improved with glycemic control/fluid, continue to trend labs anticipating improvement with recovery of renal function. 5.  Shock: Circulatory collapse likely related to his metabolic acidemia.  Weaned off of pressors/fluids.  Given his renal recovery, nephrology will sign off and remain available for questions or concerns.  Subjective:   Some episodes of hypotension overnight prompting fluid bolus administration.   Objective:   BP (!) 142/64   Pulse 73   Temp 98.2 F (36.8 C) (Oral)   Resp 15   Ht 6' (1.829 m)   Wt 96.4 kg   SpO2 96%   BMI 28.82 kg/m   Intake/Output Summary (Last 24 hours) at 01/05/2024 0758 Last data filed at 01/05/2024 0600 Gross per 24 hour  Intake 2261.83 ml  Output 3835 ml  Net -1573.17 ml   Weight change: -2.8 kg  Physical Exam: Gen: Sitting up in bed, eating breakfast CVS:  Pulse regular rhythm, normal rate, S1-S2 Resp: Anteriorly clear to auscultation, no rales/rhonchi.   Abd: Soft, flat, mild tenderness over epigastric area, bowel sounds normal Ext: No lower extremity edema  Imaging: No results found.   Labs: BMET Recent Labs  Lab 01/02/24 0452 01/02/24 1055 01/02/24 1553 01/03/24 0422 01/03/24 1612 01/04/24 0324 01/04/24 1550 01/05/24 0548  NA 134* 135 135 143 134* 133*  136 134* 134*  K 3.7 3.8 3.5 4.5 4.5 3.5  3.6 3.2* 3.3*  CL 92* 93* 92* 100 92* 88*  90* 92* 91*  CO2 <7* 8* 12* 13* 16* 21*  20* 21* 28  GLUCOSE 119* 154* 202* 311* 261* 283*  285* 289* 276*  BUN 74* 60* 48* 33* 28* 21  21 22 19   CREATININE 9.15* 7.09* 5.58* 3.23* 2.08* 1.49*  1.49* 1.51* 1.00  CALCIUM  7.6* 6.9* 7.2* 7.2* 6.9* 7.5*  7.5* 7.7* 8.0*  PHOS 10.3*  --  5.6* 5.0* 3.7 3.0 2.7 2.5   CBC Recent Labs  Lab 01/01/24 1410 01/01/24 1440 01/02/24 0451 01/02/24 0649 01/02/24 1429 01/03/24 1612 01/04/24 0324 01/05/24 0548  WBC 16.4*   < > 8.9  --   --  18.2* 14.9* 12.3*  NEUTROABS 12.5*  --  7.7  --   --   --   --   --   HGB 12.7*   < > 13.0   < > 11.9* 10.4* 10.2* 9.7*  HCT 39.7   < > 39.9   < > 34.4* 31.0* 30.7* 29.7*  MCV 76.8*   < > 74.2*  --   --  72.9* 72.4* 75.0*  PLT 406*   < >  259  --   --  174 172 130*   < > = values in this interval not displayed.    Medications:     (feeding supplement) PROSource Plus  30 mL Oral TID BM   Chlorhexidine  Gluconate Cloth  6 each Topical Daily   enoxaparin  (LOVENOX ) injection  40 mg Subcutaneous Q24H   feeding supplement  1 Container Oral TID BM   insulin  aspart  0-5 Units Subcutaneous QHS   insulin  aspart  0-9 Units Subcutaneous TID WC   insulin  glargine-yfgn  25 Units Subcutaneous Daily   multivitamin  1 tablet Oral QHS   Gordy Blanch, MD 01/05/2024, 7:58 AM

## 2024-01-05 NOTE — Progress Notes (Signed)
 NAME:  Riley Clarke, MRN:  [id number], DOB:  Nov 24, 1955, LOS: 4 ADMISSION DATE:  01/01/2024  History of Present Illness:  Pt is a with significant pmhx of diabetes 2, HLD, HTN, Chronic Back pain, and Arthritis who presents by EMS from home with N/V/D for approxmiately the 7 days and abdominal pain. Notes persistent diarrhea for the last week but just 3 episodes of vomiting. Also decreased to no urine output. No sick contacts. Patient states that patient had a mounjaro  injection last Monday 12/24/23, which he contributes to the abdominal pain. He was also still taking Metformin , lisinopril /hydrochlorothiazide , and Jardiance .    Initial Istat labs revealed- metabolic acidosis- venous pH 6.963, lactic acid of 10.7, creatinine of 12.20, glucose of 395, BUN of 93. CBC 16.4, HCT 39, and platelets 406. Concern for DKA and Renal Failure. Upon initial assessment of patient, patient having kussmaul respirations, tachycardia- HR 100s, BP within normal limits, O2 sat 99% on RA. PCCM consulted for admitted due to metabolic derangements along with Nephro consult.   Pertinent  Medical History  Arthritis Chronic back pain on chronic opioids T2DM GERD HLD HTN Vit D Deficiency  Significant Hospital Events: Including procedures, antibiotic start and stop dates in addition to other pertinent events   7/22 admitted to [facility], CRRT started due to worsening metabolic acidosis  7/23 GIPP positive salmonella, EPEC, ETEC. Ceftriaxone  started 7/25 CRRT stopped, Off pressors  Interim History / Subjective:   Awake. Remains off pressors.  Making urine  Objective    Blood pressure (!) 113/59, pulse 83, temperature 98.2 F (36.8 C), temperature source Oral, resp. rate (!) 21, height 6' (1.829 m), weight 96.4 kg, SpO2 93%. CVP:  [2 mmHg-43 mmHg] 2 mmHg      Intake/Output Summary (Last 24 hours) at 01/05/2024 0937 Last data filed at 01/05/2024 0911 Gross per 24 hour  Intake 1465.15 ml  Output 4460 ml  Net  -2994.85 ml   Filed Weights   01/03/24 0500 01/04/24 0500 01/05/24 0622  Weight: 97.7 kg 99.2 kg 96.4 kg    Examination: Blood pressure (!) 113/59, pulse 83, temperature 98.2 F (36.8 C), temperature source Oral, resp. rate (!) 21, height 6' (1.829 m), weight 96.4 kg, SpO2 93%. Gen:      No acute distress HEENT:  EOMI, sclera anicteric Neck:     No masses; no thyromegaly Lungs:    Clear to auscultation bilaterally; normal respiratory effort CV:         Regular rate and rhythm; no murmurs Abd:      + bowel sounds; soft, non-tender; no palpable masses, no distension Ext:    No edema; adequate peripheral perfusion Neuro: alert and oriented x 3 Psych: normal mood and affect   Lab/imaging reviewed Significant for sodium 134, potassium 3.3 WBC 12.3, hemoglobin 9.7, platelets 130  Resolved problem list  Afib with RVR - s/p amiodarone  gtt Assessment and Plan  Metabolic Acidosis Lactic Acidosis DKA, hx T2DM Shock Metabolic acidosis likely secondary to metformin  induced lactic acidosis, AKI, and DKA - Semglee  30 units and Novolog  4 units TID.  Increase sliding scale to moderate - Stable off pressors   Salmonella, ETEC, EPEC - Continue Ceftriaxone  (7/23-) 10 course - Supportive care  - Enteric precautions    Acute Renal Failure Suspected pre-renal secondary to GI losses, ACE-I/thiazide use, Jardiance  use - Nephrology following - Off CRRT and making good urine   Chronic back pain - tylenol  q6h PRN mild pain - oxycodone  5mg  q6h PRN severe pain -  avoid nephrotoxic medications  Deconditioning Will get PT to see him   Stable for transfer out of ICU and to hospitalist service.   Best Practice (right click and Reselect all SmartList Selections daily)   Diet/type: Regular consistency (see orders) DVT prophylaxis SCD Pressure ulcer(s): N/A GI prophylaxis: N/A Lines: Dialysis Catheter and No longer needed.  Order written to d/c  Foley:  N/A  Code Status:  full code Last  date of multidisciplinary goals of care discussion []   Critical care time:

## 2024-01-05 NOTE — Progress Notes (Signed)
 Inpatient Rehab Admissions Coordinator Note:   Per PT patient was screened for CIR candidacy by Denika Krone SHAUNNA Yvone Cohens, CCC-SLP. At this time, pt appears to be a potential candidate for CIR. If pt would like to be considered, please place an IP Rehab MD Consult order.   Tinnie Yvone Cohens, MS, CCC-SLP Admissions Coordinator [phone] 01/05/24 12:55 PM

## 2024-01-05 NOTE — Evaluation (Signed)
 Physical Therapy Evaluation Patient Details Name: Riley Clarke MRN: [id number] DOB: 1956-04-12 Today's Date: 01/05/2024  History of Present Illness  68 yo male presents to ED 7/22 via EMS with N/V/D for approxmiately the 7 days and abdominal pain week following Mounjaro  injection. Pt found to have metabolic acidosis, lactic acidosis, DKA, shock, and acute renal failure. Placed on CRRT. 7/25 removed from CRRT 7/25 PMH: diabetes 2, HLD, HTN, Chronic Back pain, and Arthritis .  Clinical Impression  PTA pt living alone in single story home with steps to enter. Pt was independent with mobility without AD, independent with ADLs, and iADLs and driving. Pt is currently limited in safe mobility by increased generalized weakness and associated decreased balance and endurance in presence of dizziness with positional change. Pt requires modA for bed mobility and mod-maxA for transfers. Patient will benefit from intensive inpatient follow-up therapy, >3 hours/day. Pt reports that he might be able to stay at sisters house at discharge. PT will continue to follow acutely, and refer to Mobility Specialist          If plan is discharge home, recommend the following: A lot of help with walking and/or transfers;A lot of help with bathing/dressing/bathroom;Assistance with cooking/housework;Direct supervision/assist for medications management;Direct supervision/assist for financial management;Assist for transportation;Help with stairs or ramp for entrance;Supervision due to cognitive status   Can travel by private vehicle        Equipment Recommendations BSC/3in1     Functional Status Assessment Patient has had a recent decline in their functional status and demonstrates the ability to make significant improvements in function in a reasonable and predictable amount of time.     Precautions / Restrictions Precautions Precautions: Fall Precaution/Restrictions Comments: fall prior to  admission Restrictions Weight Bearing Restrictions Per Provider Order: No      Mobility  Bed Mobility Overal bed mobility: Needs Assistance Bed Mobility: Supine to Sit     Supine to sit: HOB elevated, Used rails, Mod assist     General bed mobility comments: with step by step cuing pt able to get LE off bed and reach to rail, requires modA for elevating trunk and pad scoot of hips to EoB    Transfers Overall transfer level: Needs assistance Equipment used: Rolling walker (2 wheels) Transfers: Sit to/from Stand, Bed to chair/wheelchair/BSC Sit to Stand: Max assist   Step pivot transfers: Mod assist       General transfer comment: increased time and cuing for sequencing to power up into standing, once in standing modA for steadying with pivotal steps to chair        Balance Overall balance assessment: Needs assistance Sitting-balance support: Feet supported, Bilateral upper extremity supported, Single extremity supported Sitting balance-Leahy Scale: Fair     Standing balance support: Bilateral upper extremity supported, During functional activity, Reliant on assistive device for balance Standing balance-Leahy Scale: Poor                               Pertinent Vitals/Pain Pain Assessment Pain Assessment: Faces Faces Pain Scale: Hurts even more Pain Location: sacrum Pain Descriptors / Indicators: Sore, Tender Pain Intervention(s): Limited activity within patient's tolerance, Monitored during session, Repositioned    Home Living Family/patient expects to be discharged to:: Private residence Living Arrangements: Alone Available Help at Discharge: Family;Available PRN/intermittently (reports he might be able to go live with his sister for a while.) Type of Home: House Home Access: Stairs to enter  Home Layout: One level Home Equipment: Agricultural consultant (2 wheels)      Prior Function Prior Level of Function : Independent/Modified Independent              Mobility Comments: reports ambulatiing without AD, driving ADLs Comments: reports independence with self care and iADLs     Extremity/Trunk Assessment   Upper Extremity Assessment Upper Extremity Assessment: Generalized weakness    Lower Extremity Assessment Lower Extremity Assessment: RLE deficits/detail;LLE deficits/detail RLE Deficits / Details: hip flex 3/5 knee and ankle ROM full and Strength 4/5 with increased time RLE Sensation: WNL RLE Coordination: decreased fine motor LLE Deficits / Details: hip flex 3/5 knee and ankle ROM full and Strength 4/5 with increased time LLE Sensation: WNL LLE Coordination: decreased fine motor    Cervical / Trunk Assessment Cervical / Trunk Assessment: Other exceptions Cervical / Trunk Exceptions: low back pan chronic  Communication   Communication Communication: No apparent difficulties    Cognition Arousal: Alert Behavior During Therapy: WFL for tasks assessed/performed   PT - Cognitive impairments: Memory, Problem solving, Safety/Judgement, Awareness, Orientation   Orientation impairments: Time, Situation                   PT - Cognition Comments: able to read calendar on wall, but wrong date, not completely sure of why he is in ICU Following commands: Intact       Cueing Cueing Techniques: Verbal cues, Gestural cues, Tactile cues, Visual cues     General Comments General comments (skin integrity, edema, etc.): VSS on RA, pt with complaints of dizziness BP 126/62. possible nystagmus with finger follow to R        Assessment/Plan    PT Assessment Patient needs continued PT services  PT Problem List Decreased strength;Decreased activity tolerance;Decreased balance;Decreased safety awareness;Decreased knowledge of use of DME;Pain       PT Treatment Interventions DME instruction;Gait training;Stair training;Functional mobility training;Therapeutic activities;Therapeutic exercise;Balance  training;Cognitive remediation;Patient/family education    PT Goals (Current goals can be found in the Care Plan section)  Acute Rehab PT Goals Patient Stated Goal: go home PT Goal Formulation: With patient Time For Goal Achievement: 01/19/24 Potential to Achieve Goals: Fair    Frequency Min 3X/week        AM-PAC PT 6 Clicks Mobility  Outcome Measure Help needed turning from your back to your side while in a flat bed without using bedrails?: None Help needed moving from lying on your back to sitting on the side of a flat bed without using bedrails?: A Lot Help needed moving to and from a bed to a chair (including a wheelchair)?: A Lot Help needed standing up from a chair using your arms (e.g., wheelchair or bedside chair)?: A Lot Help needed to walk in hospital room?: Total Help needed climbing 3-5 steps with a railing? : Total 6 Click Score: 12    End of Session Equipment Utilized During Treatment: Gait belt Activity Tolerance: Patient tolerated treatment well Patient left: in chair;with call bell/phone within reach;with chair alarm set;with nursing/sitter in room Nurse Communication: Mobility status PT Visit Diagnosis: Unsteadiness on feet (R26.81);Other abnormalities of gait and mobility (R26.89);Muscle weakness (generalized) (M62.81);History of falling (Z91.81);Difficulty in walking, not elsewhere classified (R26.2);Dizziness and giddiness (R42);Pain Pain - part of body:  (back)    Time: 8881-8863 PT Time Calculation (min) (ACUTE ONLY): 18 min   Charges:   PT Evaluation $PT Eval Moderate Complexity: 1 Mod   PT General Charges $$ ACUTE PT VISIT:  1 Visit         Almarie B. Fleeta Lapidus PT, DPT Acute Rehabilitation Services Please use secure chat or  Call Office [phone]   Almarie KATHEE Fleeta [facility] 01/05/2024, 12:20 PM

## 2024-01-05 NOTE — Plan of Care (Signed)

## 2024-01-06 ENCOUNTER — Other Ambulatory Visit: Payer: Self-pay

## 2024-01-06 DIAGNOSIS — E1310 Other specified diabetes mellitus with ketoacidosis without coma: Secondary | ICD-10-CM

## 2024-01-06 LAB — TYPE AND SCREEN
ABO/RH(D): AB POS
Antibody Screen: NEGATIVE
Donor AG Type: NEGATIVE
Donor AG Type: NEGATIVE
Unit division: 0
Unit division: 0

## 2024-01-06 LAB — GLUCOSE, CAPILLARY
Glucose-Capillary: 234 mg/dL — ABNORMAL HIGH (ref 70–99)
Glucose-Capillary: 193 mg/dL — ABNORMAL HIGH (ref 70–99)
Glucose-Capillary: 211 mg/dL — ABNORMAL HIGH (ref 70–99)
Glucose-Capillary: 158 mg/dL — ABNORMAL HIGH (ref 70–99)

## 2024-01-06 LAB — RENAL FUNCTION PANEL
Albumin: 2.4 g/dL — ABNORMAL LOW (ref 3.5–5.0)
Anion gap: 15 (ref 5–15)
BUN: 17 mg/dL (ref 8–23)
CO2: 23 mmol/L (ref 22–32)
Calcium: 8.4 mg/dL — ABNORMAL LOW (ref 8.9–10.3)
Chloride: 102 mmol/L (ref 98–111)
Creatinine, Ser: 0.85 mg/dL (ref 0.61–1.24)
GFR, Estimated: >60 mL/min (ref >60)
Glucose, Bld: 149 mg/dL — ABNORMAL HIGH (ref 70–99)
Phosphorus: 2.5 mg/dL (ref 2.5–4.6)
Potassium: 3.2 mmol/L — ABNORMAL LOW (ref 3.5–5.1)
Sodium: 140 mmol/L (ref 135–145)

## 2024-01-06 LAB — BPAM RBC
Blood Product Expiration Date: 202508182359
Blood Product Expiration Date: 202508222359
Unit Type and Rh: 6200
Unit Type and Rh: 6200

## 2024-01-06 LAB — CBC
HCT: 32.1 % — ABNORMAL LOW (ref 39.0–52.0)
Hemoglobin: 10.3 g/dL — ABNORMAL LOW (ref 13.0–17.0)
MCH: 24 pg — ABNORMAL LOW (ref 26.0–34.0)
MCHC: 32.1 g/dL (ref 30.0–36.0)
MCV: 74.8 fL — ABNORMAL LOW (ref 80.0–100.0)
Platelets: 139 K/uL — ABNORMAL LOW (ref 150–400)
RBC: 4.29 MIL/uL (ref 4.22–5.81)
RDW: 16.9 % — ABNORMAL HIGH (ref 11.5–15.5)
WBC: 16 K/uL — ABNORMAL HIGH (ref 4.0–10.5)
nRBC: 0.2 % (ref 0.0–0.2)

## 2024-01-06 LAB — MAGNESIUM: Magnesium: 1.7 mg/dL (ref 1.7–2.4)

## 2024-01-06 MED ORDER — K PHOS MONO-SOD PHOS DI & MONO 155-852-130 MG PO TABS
500.0000 mg | ORAL_TABLET | Freq: Two times a day (BID) | ORAL | Status: DC
  Administered 2024-01-06: 500 mg via ORAL
  Filled 2024-01-06: qty 2

## 2024-01-06 MED ORDER — POTASSIUM CHLORIDE CRYS ER 20 MEQ PO TBCR
20.0000 meq | EXTENDED_RELEASE_TABLET | Freq: Once | ORAL | Status: AC
  Administered 2024-01-06: 20 meq via ORAL
  Filled 2024-01-06: qty 1

## 2024-01-06 MED ORDER — MAGNESIUM SULFATE 2 GM/50ML IV SOLN
2.0000 g | Freq: Once | INTRAVENOUS | Status: AC
  Administered 2024-01-06: 2 g via INTRAVENOUS
  Filled 2024-01-06: qty 50

## 2024-01-06 NOTE — TOC Progression Note (Signed)
 Transition of Care [facility]) - Progression Note    Patient Details  Name: Riley Clarke MRN: [id number] Date of Birth: [birth year]  Transition of Care [facility] CM/SW Contact  Bridget Cordella Simmonds, LCSW Phone Number: 01/06/2024, 2:07 PM  Clinical Narrative:   CSW spoke with pt regarding DC plan.  Pt somewhat irritable, reports that he is planning to DC home.  Discussed PT recommendations for CIR--pt confirmed he does not want CIR.  Also reports that he is not interested in SNF or HH either, just plans to DC home.  Team updated.      Expected Discharge Plan:  (TBD) Barriers to Discharge: Continued Medical Work up               Expected Discharge Plan and Services                                               Social Drivers of Health (SDOH) Interventions SDOH Screenings   Food Insecurity: Patient Declined (12/25/2023)  Housing: Unknown (12/25/2023)  Transportation Needs: No Transportation Needs (12/25/2023)  Utilities: Not At Risk (08/10/2022)  Alcohol Screen: Low Risk  (12/25/2023)  Depression (PHQ2-9): Low Risk  (12/27/2023)  Financial Resource Strain: Low Risk  (12/25/2023)  Physical Activity: Inactive (12/25/2023)  Social Connections: Unknown (12/25/2023)  Stress: No Stress Concern Present (12/25/2023)  Tobacco Use: Medium Risk (12/27/2023)    Readmission Risk Interventions     No data to display

## 2024-01-06 NOTE — Progress Notes (Signed)
 PROGRESS NOTE    Riley Clarke  FMW:[id number] DOB: 06-Oct-1955 DOA: 01/01/2024 PCP: [name], NP   Brief Narrative: 68 year old with past medical history significant for diabetes type 2, hypertension, hyperlipidemia, chronic back pain, arthritis presented by EMS from home with nausea vomiting diarrhea approximately for 7 days, accompanied by abdominal pain.  Reports persistent diarrhea for the last week and 3 episode of vomiting.  No urine output.  He had Mounjaro  injection on 12/24/2023, which he thinks contributes to the abdominal pain.  He was still taking metformin , lisinopril , hydrochlorothiazide  and Jardiance .  Patient was found to have severe metabolic acidosis with a pH of 6.9, lactic acid of 10, creatinine of 12, glucose 395.  Patient was admitted for DKA, acute renal failure.  Patient was noted to have Kussmaul respiration, tachycardia.  He was admitted by CCM, nephrology was consulted.  He required CRRT but subsequently was discontinued.  He was transiently on pressors.  GI pathogen positive for Salmonella.  He was started on ceftriaxone .  Significant hospital events: 7/22 admitted to [facility], CRRT started due to worsening metabolic acidosis  7/23 GIPP positive salmonella, EPEC, ETEC. Ceftriaxone  started 7/25 CRRT stopped, Off pressors     Assessment & Plan:   Principal Problem:   DKA (diabetic ketoacidosis) (HCC) Active Problems:   AKI (acute kidney injury) (HCC)   Sepsis (HCC)   Hyponatremia  1-DKA, diabetes type 2 Metabolic acidosis -Patient was treated with IV fluids, insulin  drip. - He has been transitioned to Semglee   30 units and NovoLog  4 units 3 times daily Adjust insulin  as needed.   Lactic acidosis, septic shock, metabolic acidosis -Septic shock in the setting of gastroenteritis, presented with hypotension, hypothermia tachycardia, leukocytosis.  Source of infection gastroenteritis. -Metabolic acidosis thought to be related to metformin  induced lactic  acidosis, AKI and DKA. -Lactic acid more than 9 - He has been off pressors Acidosis resolved.   Gastroenteritis,Salmonella, ETEC, EPEC -Presented with a week of nausea vomiting diarrhea abdominal pain. - GI pathogen positive for Salmonella enteropathogenic E. coli and enterotoxigenic E. Coli -C. difficile negative - He was started on IV ceftriaxone  -improvement of diarrhea, report 2 BM per day, more form.   A-fib with RVR status post amiodarone  drip   Acute renal failure -Presented with a creatinine of 11.2 -Prerenal secondary to GI losses, ACE and hydrochlorothiazide  use, Jardiance  use - Nephrology was consulted, patient underwent CRRT from 7/22; until 7/25, subsequently discontinued CRRT. -Creatinine down to 0.8, and has remained stable  Hypophosphatemia: Replace  Chronic back pain -pain management.    Nutrition Problem: Inadequate oral intake Etiology: acute illness, nausea, vomiting, poor appetite    Signs/Symptoms: per patient/family report    Interventions: Refer to RD note for recommendations  Estimated body mass index is 28.46 kg/m as calculated from the following:   Height as of this encounter: 6' (1.829 m).   Weight as of this encounter: 95.2 kg.   DVT prophylaxis: Lovenox  Code Status: Full code Family Communication: Care discussed with patient.  Disposition Plan:  Status is: Inpatient Remains inpatient appropriate because: management of shock, renal failure. Stable for rehab.     Consultants:  Nephrology CCM  Procedures:  none  Antimicrobials:    Subjective: He report 2 BM per day, loose stool, still having abdominal pain.    Objective: Vitals:   01/05/24 2223 01/06/24 0415 01/06/24 0416 01/06/24 0733  BP: 105/64 (!) 121/56  131/67  Pulse: 100 87  (!) 102  Resp: 19 18  16  Temp: 98.9 F (37.2 C) 99.8 F (37.7 C)  98.2 F (36.8 C)  TempSrc: Oral Oral  Oral  SpO2: 99% 98%  100%  Weight:   95.2 kg   Height:         Intake/Output Summary (Last 24 hours) at 01/06/2024 0735 Last data filed at 01/05/2024 1500 Gross per 24 hour  Intake 100 ml  Output 900 ml  Net -800 ml   Filed Weights   01/04/24 0500 01/05/24 0622 01/06/24 0416  Weight: 99.2 kg 96.4 kg 95.2 kg    Examination:  General exam: Appears calm and comfortable  Respiratory system: Clear to auscultation. Respiratory effort normal. Cardiovascular system: S1 & S2 heard, RRR. No JVD, murmurs, rubs, gallops or clicks. No pedal edema. Gastrointestinal system: Abdomen is nondistended, soft and nontender. No organomegaly or masses felt. Normal bowel sounds heard. Central nervous system: Alert and oriented. No focal neurological deficits. Extremities: Symmetric 5 x 5 power.   Data Reviewed: I have personally reviewed following labs and imaging studies  CBC: Recent Labs  Lab 01/01/24 1410 01/01/24 1440 01/01/24 2041 01/02/24 0451 01/02/24 0649 01/02/24 1055 01/02/24 1429 01/03/24 1612 01/04/24 0324 01/05/24 0548  WBC 16.4*  --  13.8* 8.9  --   --   --  18.2* 14.9* 12.3*  NEUTROABS 12.5*  --   --  7.7  --   --   --   --   --   --   HGB 12.7*   < > 12.5* 13.0   < > 11.9* 11.9* 10.4* 10.2* 9.7*  HCT 39.7   < > 40.1 39.9   < > 35.6* 34.4* 31.0* 30.7* 29.7*  MCV 76.8*  --  78.2* 74.2*  --   --   --  72.9* 72.4* 75.0*  PLT 406*  --  381 259  --   --   --  174 172 130*   < > = values in this interval not displayed.   Basic Metabolic Panel: Recent Labs  Lab 01/01/24 2043 01/01/24 2311 01/02/24 0451 01/02/24 0452 01/03/24 0421 01/03/24 0422 01/03/24 1612 01/04/24 0324 01/04/24 1550 01/05/24 0548 01/05/24 1629  NA 131*   < >  --    < >  --    < > 134* 133*  136 134* 134* 135  K 4.0   < >  --    < >  --    < > 4.5 3.5  3.6 3.2* 3.3* 3.2*  CL 91*   < >  --    < >  --    < > 92* 88*  90* 92* 91* 97*  CO2 <7*   < >  --    < >  --    < > 16* 21*  20* 21* 28 26  GLUCOSE 222*   < >  --    < >  --    < > 261* 283*  285* 289*  276* 288*  BUN 84*   < >  --    < >  --    < > 28* 21  21 22 19 23   CREATININE 11.26*   < >  --    < >  --    < > 2.08* 1.49*  1.49* 1.51* 1.00 0.87  CALCIUM  7.7*   < >  --    < >  --    < > 6.9* 7.5*  7.5* 7.7* 8.0* 8.3*  MG 2.9*  --  2.3  --  2.4  --   --  2.6*  --  2.2  --   PHOS >30.0*  --   --    < >  --    < > 3.7 3.0 2.7 2.5 1.3*   < > = values in this interval not displayed.   GFR: Estimated Creatinine Clearance: 99.9 mL/min (by C-G formula based on SCr of 0.87 mg/dL). Liver Function Tests: Recent Labs  Lab 01/01/24 1410 01/01/24 2043 01/02/24 0452 01/03/24 1612 01/04/24 0324 01/04/24 1550 01/05/24 0548 01/05/24 1629  AST 34 39  --   --   --   --   --   --   ALT 21 23  --   --   --   --   --   --   ALKPHOS 117 129*  --   --   --   --   --   --   BILITOT 0.5 0.7  --   --   --   --   --   --   PROT 6.4* 6.1*  --   --   --   --   --   --   ALBUMIN 2.5* 2.4*   < > 1.9* 2.0* 1.9* 2.0* 2.2*   < > = values in this interval not displayed.   Recent Labs  Lab 01/01/24 1410  LIPASE 69*   No results for input(s): AMMONIA in the last 168 hours. Coagulation Profile: Recent Labs  Lab 01/01/24 1410  INR 1.3*   Cardiac Enzymes: No results for input(s): CKTOTAL, CKMB, CKMBINDEX, TROPONINI in the last 168 hours. BNP (last 3 results) No results for input(s): PROBNP in the last 8760 hours. HbA1C: No results for input(s): HGBA1C in the last 72 hours. CBG: Recent Labs  Lab 01/05/24 0741 01/05/24 1130 01/05/24 1205 01/05/24 1631 01/05/24 2152  GLUCAP 235* 370* 369* 279* 135*   Lipid Profile: No results for input(s): CHOL, HDL, LDLCALC, TRIG, CHOLHDL, LDLDIRECT in the last 72 hours. Thyroid  Function Tests: No results for input(s): TSH, T4TOTAL, FREET4, T3FREE, THYROIDAB in the last 72 hours. Anemia Panel: No results for input(s): VITAMINB12, FOLATE, FERRITIN, TIBC, IRON, RETICCTPCT in the last 72 hours. Sepsis  Labs: Recent Labs  Lab 01/01/24 1440 01/01/24 2043  LATICACIDVEN 10.7* >9.0*    Recent Results (from the past 240 hours)  MRSA Next Gen by PCR, Nasal     Status: None   Collection Time: 01/01/24  4:06 PM   Specimen: Nasal Mucosa; Nasal Swab  Result Value Ref Range Status   MRSA by PCR Next Gen NOT DETECTED NOT DETECTED Final    Comment: (NOTE) The GeneXpert MRSA Assay (FDA approved for NASAL specimens only), is one component of a comprehensive MRSA colonization surveillance program. It is not intended to diagnose MRSA infection nor to guide or monitor treatment for MRSA infections. Test performance is not FDA approved in patients less than 66 years old. Performed at [facility], [address] [place], [place] [address]   Resp panel by RT-PCR (RSV, Flu A&B, Covid) Urine, Clean Catch     Status: None   Collection Time: 01/01/24  4:07 PM   Specimen: Urine, Clean Catch; Nasal Swab  Result Value Ref Range Status   SARS Coronavirus 2 by RT PCR NEGATIVE NEGATIVE Final   Influenza A by PCR NEGATIVE NEGATIVE Final   Influenza B by PCR NEGATIVE NEGATIVE Final    Comment: (NOTE) The Xpert Xpress SARS-CoV-2/FLU/RSV plus assay is intended as an  aid in the diagnosis of influenza from Nasopharyngeal swab specimens and should not be used as a sole basis for treatment. Nasal washings and aspirates are unacceptable for Xpert Xpress SARS-CoV-2/FLU/RSV testing.  Fact Sheet for Patients: BloggerCourse.com  Fact Sheet for Healthcare Providers: SeriousBroker.it  This test is not yet approved or cleared by the [place]  FDA and has been authorized for detection and/or diagnosis of SARS-CoV-2 by FDA under an Emergency Use Authorization (EUA). This EUA will remain in effect (meaning this test can be used) for the duration of the COVID-19 declaration under Section 564(b)(1) of the Act, 21 U.S.C. section 360bbb-3(b)(1), unless the  authorization is terminated or revoked.     Resp Syncytial Virus by PCR NEGATIVE NEGATIVE Final    Comment: (NOTE) Fact Sheet for Patients: BloggerCourse.com  Fact Sheet for Healthcare Providers: SeriousBroker.it  This test is not yet approved or cleared by the [place]  FDA and has been authorized for detection and/or diagnosis of SARS-CoV-2 by FDA under an Emergency Use Authorization (EUA). This EUA will remain in effect (meaning this test can be used) for the duration of the COVID-19 declaration under Section 564(b)(1) of the Act, 21 U.S.C. section 360bbb-3(b)(1), unless the authorization is terminated or revoked.  Performed at [facility], [address], [place], [place] [address]   Gastrointestinal Panel by PCR , Stool     Status: Abnormal   Collection Time: 01/02/24  2:30 AM   Specimen: Stool  Result Value Ref Range Status   Campylobacter species NOT DETECTED NOT DETECTED Final   Plesimonas shigelloides NOT DETECTED NOT DETECTED Final   Salmonella species DETECTED (A) NOT DETECTED Final    Comment: RESULT CALLED TO, READ BACK BY AND VERIFIED WITH: YENI GRANADOS 1055 01/02/2024 DLB    Yersinia enterocolitica NOT DETECTED NOT DETECTED Final   Vibrio species NOT DETECTED NOT DETECTED Final   Vibrio cholerae NOT DETECTED NOT DETECTED Final   Enteroaggregative E coli (EAEC) NOT DETECTED NOT DETECTED Final   Enteropathogenic E coli (EPEC) DETECTED (A) NOT DETECTED Final    Comment: RESULT CALLED TO, READ BACK BY AND VERIFIED WITH: YENI GRANADOS 1055 01/02/2024 DLB    Enterotoxigenic E coli (ETEC) DETECTED (A) NOT DETECTED Final    Comment: RESULT CALLED TO, READ BACK BY AND VERIFIED WITH: YENI GRANADOS 1055 01/02/2024 DLB    Shiga like toxin producing E coli (STEC) NOT DETECTED NOT DETECTED Final   Shigella/Enteroinvasive E coli (EIEC) NOT DETECTED NOT DETECTED Final   Cryptosporidium NOT DETECTED NOT DETECTED  Final   Cyclospora cayetanensis NOT DETECTED NOT DETECTED Final   Entamoeba histolytica NOT DETECTED NOT DETECTED Final   Giardia lamblia NOT DETECTED NOT DETECTED Final   Adenovirus F40/41 NOT DETECTED NOT DETECTED Final   Astrovirus NOT DETECTED NOT DETECTED Final   Norovirus GI/GII NOT DETECTED NOT DETECTED Final   Rotavirus A NOT DETECTED NOT DETECTED Final   Sapovirus (I, II, IV, and V) NOT DETECTED NOT DETECTED Final    Comment: Performed at [facility], [address] [place], [place] [address]  C Difficile Quick Screen w PCR reflex     Status: None   Collection Time: 01/02/24  2:30 AM  Result Value Ref Range Status   C Diff antigen NEGATIVE NEGATIVE Final   C Diff toxin NEGATIVE NEGATIVE Final   C Diff interpretation No C. difficile detected.  Final    Comment: Performed at [facility], [address], [place], [place] [address]  Urine  Culture     Status: None   Collection Time: 01/02/24  4:00 AM   Specimen: Urine, Random  Result Value Ref Range Status   Specimen Description URINE, RANDOM  Final   Special Requests NONE Reflexed from [id number]  Final   Culture   Final    NO GROWTH Performed at [facility], [address] [place], [place] [address]    Report Status 01/03/2024 FINAL  Final         Radiology Studies: No results found.      Scheduled Meds:  (feeding supplement) PROSource Plus  30 mL Oral TID BM   Chlorhexidine  Gluconate Cloth  6 each Topical Daily   enoxaparin  (LOVENOX ) injection  40 mg Subcutaneous Q24H   feeding supplement  1 Container Oral TID BM   insulin  aspart  0-15 Units Subcutaneous TID WC   insulin  aspart  0-5 Units Subcutaneous QHS   insulin  aspart  4 Units Subcutaneous TID WC   insulin  glargine-yfgn  30 Units Subcutaneous Daily   multivitamin  1 tablet Oral QHS   phosphorus  500 mg Oral BID   potassium chloride   20 mEq Oral BID   Continuous Infusions:  cefTRIAXone  (ROCEPHIN )  IV 2 g (01/05/24 0850)      LOS: 5 days    Time spent: 35 Minutes    Emmalene Kattner A Savian Mazon, MD [facility]   If 7PM-7AM, please contact night-coverage [email or web address]  01/06/2024, 7:35 AM

## 2024-01-06 NOTE — Evaluation (Signed)
 Occupational Therapy Evaluation Patient Details Name: Riley Clarke MRN: [id number] DOB: 09-Jul-1955 Today's Date: 01/06/2024   History of Present Illness   68 yo male presents to ED 7/22 via EMS with N/V/D for approxmiately the 7 days and abdominal pain week following Mounjaro  injection. Pt found to have metabolic acidosis, lactic acidosis, DKA, shock, and acute renal failure. Placed on CRRT. 7/25 removed from CRRT 7/25 PMH: diabetes 2, HLD, HTN, Chronic Back pain, and Arthritis .     Clinical Impressions Pt ind with ADLs at baseline and mobility, lives alone, possibly staying with mother at d/c. Pt currently needing min A for ADLs, min A for bed mobility and min A for transfers with HHA. Pt with decr safety awareness, up in bathroom upon arrival and hovering over BSC due to dizziness, orthostatic, see BP below. Pt also with questionable visual impairment, squints L eye and keeps eyes closed intermittently during session. Pt performing second standing attempt with RW and more stable with RW use. Pt presenting with impairments listed below, will follow acutely. Recommend HHOT at d/c pending progression.  BP seated 115/63 (78) BP supine 101/80 (87)     If plan is discharge home, recommend the following:   A little help with walking and/or transfers;A little help with bathing/dressing/bathroom;Assistance with cooking/housework;Assist for transportation;Help with stairs or ramp for entrance;Supervision due to cognitive status     Functional Status Assessment   Patient has had a recent decline in their functional status and demonstrates the ability to make significant improvements in function in a reasonable and predictable amount of time.     Equipment Recommendations   Tub/shower seat;Other (comment) (RW)     Recommendations for Other Services   PT consult     Precautions/Restrictions   Precautions Precautions: Fall Precaution/Restrictions Comments: fall prior to  admission, watch BP Restrictions Weight Bearing Restrictions Per Provider Order: No     Mobility Bed Mobility Overal bed mobility: Needs Assistance Bed Mobility: Sit to Supine       Sit to supine: Min assist        Transfers Overall transfer level: Needs assistance Equipment used: 1 person hand held assist Transfers: Sit to/from Stand, Bed to chair/wheelchair/BSC Sit to Stand: Min assist                  Balance Overall balance assessment: Needs assistance Sitting-balance support: Feet supported, Bilateral upper extremity supported, Single extremity supported Sitting balance-Leahy Scale: Good     Standing balance support: Single extremity supported Standing balance-Leahy Scale: Fair                             ADL either performed or assessed with clinical judgement   ADL Overall ADL's : Needs assistance/impaired Eating/Feeding: Set up;Sitting   Grooming: Set up;Standing   Upper Body Bathing: Minimal assistance;Sitting   Lower Body Bathing: Minimal assistance;Sitting/lateral leans;Sit to/from stand   Upper Body Dressing : Minimal assistance;Sitting;Standing   Lower Body Dressing: Minimal assistance;Sitting/lateral leans;Sit to/from stand   Toilet Transfer: Minimal assistance;Ambulation;Regular Toilet   Toileting- Architect and Hygiene: Supervision/safety       Functional mobility during ADLs: Minimal assistance       Vision   Additional Comments: at times closing L eye and closing eyes during session when reporting he feels dizzy, tracking [facility], needs further assessment     Perception         Praxis  Pertinent Vitals/Pain Pain Assessment Pain Assessment: No/denies pain     Extremity/Trunk Assessment Upper Extremity Assessment Upper Extremity Assessment: Generalized weakness   Lower Extremity Assessment Lower Extremity Assessment: Defer to PT evaluation       Communication  Communication Communication: No apparent difficulties   Cognition Arousal: Alert Behavior During Therapy: WFL for tasks assessed/performed Cognition: No family/caregiver present to determine baseline             OT - Cognition Comments: impaired safety awareness, up in bathroom upon arrival without socks, stating he is dizzy and leaning over BSC                 Following commands: Intact       Cueing  General Comments   Cueing Techniques: Verbal cues;Gestural cues;Tactile cues;Visual cues  see note for BP measures   Exercises     Shoulder Instructions      Home Living Family/patient expects to be discharged to:: Private residence Living Arrangements: Alone Available Help at Discharge: Family;Available PRN/intermittently Type of Home: House Home Access: Stairs to enter [employer] of Steps: 2 Entrance Stairs-Rails: Right Home Layout: One level     Bathroom Shower/Tub: Chief Strategy Officer: Handicapped height     Home Equipment: Cane - single point          Prior Functioning/Environment               Mobility Comments: reports ambulatiing without AD, driving ADLs Comments: reports independence with self care and iADLs    OT Problem List: Decreased cognition;Decreased strength;Decreased range of motion;Impaired balance (sitting and/or standing);Decreased activity tolerance;Impaired vision/perception;Cardiopulmonary status limiting activity   OT Treatment/Interventions: Self-care/ADL training;Therapeutic exercise;Energy conservation;DME and/or AE instruction;Therapeutic activities;Balance training;Patient/family education;Cognitive remediation/compensation;Visual/perceptual remediation/compensation      OT Goals(Current goals can be found in the care plan section)   Acute Rehab OT Goals Patient Stated Goal: none stated OT Goal Formulation: With patient Time For Goal Achievement: 01/20/24 Potential to Achieve Goals:  Good ADL Goals Pt Will Perform Upper Body Dressing: Independently;sitting Pt Will Perform Lower Body Dressing: Independently;sitting/lateral leans;sit to/from stand Pt Will Transfer to Toilet: Independently;ambulating;regular height toilet Pt Will Perform Tub/Shower Transfer: Tub transfer;Shower transfer;Independently;ambulating   OT Frequency:  Min 2X/week    Co-evaluation              AM-PAC OT 6 Clicks Daily Activity     Outcome Measure Help from another person eating meals?: A Little Help from another person taking care of personal grooming?: A Little Help from another person toileting, which includes using toliet, bedpan, or urinal?: A Little Help from another person bathing (including washing, rinsing, drying)?: A Little Help from another person to put on and taking off regular upper body clothing?: A Little Help from another person to put on and taking off regular lower body clothing?: A Little 6 Click Score: 18   End of Session Nurse Communication: Mobility status;Other (comment) (BP)  Activity Tolerance: Patient tolerated treatment well Patient left: in bed;with call bell/phone within reach;with bed alarm set  OT Visit Diagnosis: Unsteadiness on feet (R26.81);Other abnormalities of gait and mobility (R26.89);Muscle weakness (generalized) (M62.81);History of falling (Z91.81)                Time: 9179-9158 OT Time Calculation (min): 21 min Charges:  OT General Charges $OT Visit: 1 Visit OT Evaluation $OT Eval Low Complexity: 1 Low  Shakeema Lippman K, OTD, OTR/L SecureChat Preferred Acute Rehab [phone]   Laneta  K Koonce 01/06/2024, 12:46 PM

## 2024-01-06 NOTE — Progress Notes (Signed)
 Pt instructed on Incentive spirometer use x 10 every 1 hour while awake. Pt demonstrates great technique and it goes up to almost 1750.  SCDs placed on end of bed for once pt is ready for bed tonight ( he is presently sitting up on the side of the bed).

## 2024-01-06 NOTE — Progress Notes (Signed)
 Inpatient Rehab Admissions:  Inpatient Rehab Consult received.  I met with patient at the bedside for rehabilitation assessment and to discuss goals and expectations of an inpatient rehab admission.  Discussed average length of stay, insurance authorization requirement and discharge home after completion of CIR. Pt informed AC he is not interested in CIR. TOC made aware.   Signed: Tinnie Yvone Cohens, MS, CCC-SLP Admissions Coordinator [phone]

## 2024-01-07 ENCOUNTER — Telehealth (HOSPITAL_COMMUNITY): Payer: Self-pay | Admitting: Pharmacy Technician

## 2024-01-07 ENCOUNTER — Other Ambulatory Visit (HOSPITAL_COMMUNITY): Payer: Self-pay

## 2024-01-07 ENCOUNTER — Encounter (HOSPITAL_COMMUNITY): Payer: Self-pay | Admitting: Pulmonary Disease

## 2024-01-07 LAB — BASIC METABOLIC PANEL WITH GFR
Anion gap: 10 (ref 5–15)
BUN: 12 mg/dL (ref 8–23)
CO2: 19 mmol/L — ABNORMAL LOW (ref 22–32)
Calcium: 8 mg/dL — ABNORMAL LOW (ref 8.9–10.3)
Chloride: 107 mmol/L (ref 98–111)
Creatinine, Ser: 0.62 mg/dL (ref 0.61–1.24)
GFR, Estimated: >60 mL/min (ref >60)
Glucose, Bld: 201 mg/dL — ABNORMAL HIGH (ref 70–99)
Potassium: 3.4 mmol/L — ABNORMAL LOW (ref 3.5–5.1)
Sodium: 136 mmol/L (ref 135–145)

## 2024-01-07 LAB — CBC
HCT: 30.9 % — ABNORMAL LOW (ref 39.0–52.0)
Hemoglobin: 9.8 g/dL — ABNORMAL LOW (ref 13.0–17.0)
MCH: 24.3 pg — ABNORMAL LOW (ref 26.0–34.0)
MCHC: 31.7 g/dL (ref 30.0–36.0)
MCV: 76.7 fL — ABNORMAL LOW (ref 80.0–100.0)
Platelets: 136 K/uL — ABNORMAL LOW (ref 150–400)
RBC: 4.03 MIL/uL — ABNORMAL LOW (ref 4.22–5.81)
RDW: 17 % — ABNORMAL HIGH (ref 11.5–15.5)
WBC: 16.7 K/uL — ABNORMAL HIGH (ref 4.0–10.5)
nRBC: 0.2 % (ref 0.0–0.2)

## 2024-01-07 LAB — RENAL FUNCTION PANEL
Albumin: 2.3 g/dL — ABNORMAL LOW (ref 3.5–5.0)
Anion gap: 18 — ABNORMAL HIGH (ref 5–15)
BUN: 12 mg/dL (ref 8–23)
CO2: 20 mmol/L — ABNORMAL LOW (ref 22–32)
Calcium: 8.5 mg/dL — ABNORMAL LOW (ref 8.9–10.3)
Chloride: 99 mmol/L (ref 98–111)
Creatinine, Ser: 0.74 mg/dL (ref 0.61–1.24)
GFR, Estimated: >60 mL/min (ref >60)
Glucose, Bld: 222 mg/dL — ABNORMAL HIGH (ref 70–99)
Phosphorus: 3.6 mg/dL (ref 2.5–4.6)
Potassium: 2.8 mmol/L — ABNORMAL LOW (ref 3.5–5.1)
Sodium: 137 mmol/L (ref 135–145)

## 2024-01-07 LAB — GLUCOSE, CAPILLARY
Glucose-Capillary: 199 mg/dL — ABNORMAL HIGH (ref 70–99)
Glucose-Capillary: 388 mg/dL — ABNORMAL HIGH (ref 70–99)
Glucose-Capillary: 131 mg/dL — ABNORMAL HIGH (ref 70–99)
Glucose-Capillary: 191 mg/dL — ABNORMAL HIGH (ref 70–99)

## 2024-01-07 LAB — MAGNESIUM: Magnesium: 1.5 mg/dL — ABNORMAL LOW (ref 1.7–2.4)

## 2024-01-07 MED ORDER — GLUCERNA SHAKE PO LIQD: 237.0000 mL | Freq: Three times a day (TID) | ORAL | Status: DC

## 2024-01-07 MED ORDER — BANATROL TF EN LIQD
60.0000 mL | Freq: Two times a day (BID) | ENTERAL | Status: DC
  Administered 2024-01-07 – 2024-01-10 (×7): 60 mL via ORAL
  Filled 2024-01-07 (×7): qty 60

## 2024-01-07 MED ORDER — POTASSIUM CHLORIDE CRYS ER 20 MEQ PO TBCR
40.0000 meq | EXTENDED_RELEASE_TABLET | ORAL | Status: AC
  Administered 2024-01-07 (×2): 40 meq via ORAL
  Filled 2024-01-07 (×2): qty 2

## 2024-01-07 MED ORDER — PROSOURCE PLUS PO LIQD
30.0000 mL | Freq: Two times a day (BID) | ORAL | Status: DC
  Administered 2024-01-07 – 2024-01-10 (×5): 30 mL via ORAL
  Filled 2024-01-07 (×5): qty 30

## 2024-01-07 MED ORDER — GLUCERNA SHAKE PO LIQD
237.0000 mL | Freq: Two times a day (BID) | ORAL | Status: DC
  Administered 2024-01-08 – 2024-01-10 (×4): 237 mL via ORAL

## 2024-01-07 MED ORDER — MAGNESIUM SULFATE 2 GM/50ML IV SOLN
2.0000 g | Freq: Once | INTRAVENOUS | Status: AC
  Administered 2024-01-07: 2 g via INTRAVENOUS
  Filled 2024-01-07: qty 50

## 2024-01-07 MED ORDER — POTASSIUM CHLORIDE 10 MEQ/100ML IV SOLN
10.0000 meq | INTRAVENOUS | Status: AC
  Administered 2024-01-07 (×3): 10 meq via INTRAVENOUS
  Filled 2024-01-07 (×3): qty 100

## 2024-01-07 MED ORDER — METRONIDAZOLE 500 MG/100ML IV SOLN
500.0000 mg | Freq: Two times a day (BID) | INTRAVENOUS | Status: DC
  Administered 2024-01-07 – 2024-01-10 (×7): 500 mg via INTRAVENOUS
  Filled 2024-01-07 (×7): qty 100

## 2024-01-07 MED ORDER — ADULT MULTIVITAMIN W/MINERALS CH
1.0000 | ORAL_TABLET | Freq: Every day | ORAL | Status: DC
  Administered 2024-01-07 – 2024-01-10 (×4): 1 via ORAL
  Filled 2024-01-07 (×4): qty 1

## 2024-01-07 MED ORDER — SODIUM CHLORIDE 0.9 % IV BOLUS
500.0000 mL | Freq: Once | INTRAVENOUS | Status: AC
  Administered 2024-01-07: 500 mL via INTRAVENOUS

## 2024-01-07 MED ORDER — LACTATED RINGERS IV SOLN: INTRAVENOUS | Status: DC

## 2024-01-07 NOTE — Progress Notes (Signed)
 Nutrition Follow-up  DOCUMENTATION CODES:   Not applicable  INTERVENTION:  Encourage adequate, small frequent oral intake Glucerna Shake po BID, each supplement provides 220 kcal and 10 grams of protein 30 ml ProSource Plus BID, each supplement provides 100 kcals and 15 grams protein.  MVI with minerals daily Trial Banatrol BID-provides 45kcal, 5g soluble fiber and 2g protein per serving for loose stools   NUTRITION DIAGNOSIS:   Inadequate oral intake related to acute illness, nausea, vomiting, poor appetite as evidenced by per patient/family report. - Still applicable   GOAL:   Patient will meet greater than or equal to 90% of their needs - Progressing   MONITOR:   PO intake, Diet advancement, Labs, Weight trends, I & O's  REASON FOR ASSESSMENT:   Consult Assessment of nutrition requirement/status (CRRT protocol)  ASSESSMENT:   Pt admitted with DKA, hyponatremia, and AKI after starting mounjaro  a week ago. PMH significant for DM2. HLD, HTN, chronic back pain.  7/22: admitted, CRRT initiated 7/23: clear liquid diet 7/24: CHO modified diet  7/25: CRRT discontinued  7/26: Transferred out of ICU   Diet advanced from CLD to CHO modified on 7/24. Pt reports having a good appetite however would not clarify how much he was eating. Drinking 1-2 Boost Breeze per day. Denies any nausea or vomiting. No abdominal pain. Does endorse having x 5 loose BM yesterday. RD discussed switching to Glucerna to help stabilize blood sugars, pt agreeable. Pt not on CRRT anymore, protein needs decreased. Decreased Prosource to BID and transitioned from Renal MVI to regular MVI.   Admit weight: 104.8 kg - Fluid? Current weight: 95.8 kg    Average Meal Intake: 7/26: 10% x 1 meal 7/27: 75-100% x 2 meals   Nutritionally Relevant Medications: Scheduled Meds:  (feeding supplement) PROSource Plus  30 mL Oral BID BM   enoxaparin  (LOVENOX ) injection  40 mg Subcutaneous Q24H   feeding supplement  (GLUCERNA SHAKE)  237 mL Oral TID BM   insulin  aspart  0-15 Units Subcutaneous TID WC   insulin  aspart  0-5 Units Subcutaneous QHS   insulin  aspart  4 Units Subcutaneous TID WC   insulin  glargine-yfgn  30 Units Subcutaneous Daily   multivitamin with minerals  1 tablet Oral Daily   potassium chloride   40 mEq Oral Q4H   Continuous Infusions:  cefTRIAXone  (ROCEPHIN )  IV 2 g (01/07/24 0957)   lactated ringers      magnesium  sulfate bolus IVPB     metronidazole  500 mg (01/07/24 1321)   potassium chloride  10 mEq (01/07/24 1309)   sodium chloride       Labs Reviewed: Potassium 2.8 Calcium  8.5 AP 1.5 CBG ranges from 158-388 mg/dL over the last 24 hours HgbA1c 10.3  Diet Order:   Diet Order             Diet Carb Modified Fluid consistency: Thin; Room service appropriate? Yes  Diet effective now                   EDUCATION NEEDS:   Not appropriate for education at this time  Skin:  Skin Assessment: Reviewed RN Assessment  Last BM:  01/07/2024 x 7 type 7  Height:   Ht Readings from Last 1 Encounters:  01/01/24 6' (1.829 m)    Weight:   Wt Readings from Last 1 Encounters:  01/07/24 95.8 kg    Ideal Body Weight:  80.9 kg  BMI:  Body mass index is 28.64 kg/m.  Estimated Nutritional Needs:   Kcal:  2200-2400  Protein:  110-130 gm  Fluid:  >2L/day   Olivia Kenning, RD Registered Dietitian  See Amion for more information

## 2024-01-07 NOTE — Plan of Care (Signed)
   Problem: Metabolic: Goal: Ability to maintain appropriate glucose levels will improve Outcome: Progressing   Problem: Nutritional: Goal: Maintenance of adequate nutrition will improve Outcome: Progressing   Problem: Activity: Goal: Risk for activity intolerance will decrease Outcome: Progressing   Problem: Nutrition: Goal: Adequate nutrition will be maintained Outcome: Progressing

## 2024-01-07 NOTE — Inpatient Diabetes Management (Signed)
 Inpatient Diabetes Program Recommendations  AACE/ADA: New Consensus Statement on Inpatient Glycemic Control (2015)  Target Ranges:  Prepandial:   less than 140 mg/dL      Peak postprandial:   less than 180 mg/dL (1-2 hours)      Critically ill patients:  140 - 180 mg/dL   Lab Results  Component Value Date   GLUCAP 388 (H) 01/07/2024   HGBA1C 10.3 (A) 10/15/2023    Latest Reference Range & Units 01/06/24 08:17 01/06/24 11:59 01/06/24 16:09 01/06/24 20:59 01/07/24 08:58 01/07/24 12:22  Glucose-Capillary 70 - 99 mg/dL 765 (H) 806 (H) 788 (H) 158 (H) 199 (H) 388 (H)  (H): Data is abnormally high  Diabetes history: DM2 Outpatient Diabetes medications: Jardiance  25 mg every day, Tradjenta  5 mg every day, Metformin  1000 mg BID, Mounjaro  7.5 mg weekly Current orders for Inpatient glycemic control: Semglee  30 units every day, Novolog  0-15 units TID and 0-5 units at bedtime,  Novolog  4 units TID  DKA Gastroenteritis,Salmonella   Met with patient at bedside.  Confirms above home medications.  He has taken a total of 2 doses of Mounjaro .  Reviewed patient's current A1c of 10.3%. Explained what a A1c is and what it measures. Also reviewed goal A1c with patient, importance of good glucose control @ home, and blood sugar goals.  Would not recommend continuing Mounjaro  at discharge.  Explained to patient he will need insulin  at home and he is in agreement.  Lantus  and Humalog  are covered at 100% with his insurance.    Will meet with patient again tomorrow to teach insulin .  He is currently getting 2 new IV's placed.  Ordered the LWWDM booklet and insulin  starter kit.    Thank you, Wyvonna Pinal, MSN, CDCES Diabetes Coordinator Inpatient Diabetes Program [phone] (team pager from 8a-5p)

## 2024-01-07 NOTE — Progress Notes (Signed)
 PROGRESS NOTE    Riley Clarke  FMW:[id number] DOB: Aug 29, 1955 DOA: 01/01/2024 PCP: [name], NP   Brief Narrative: 68 year old with past medical history significant for diabetes type 2, hypertension, hyperlipidemia, chronic back pain, arthritis presented by EMS from home with nausea vomiting diarrhea approximately for 7 days, accompanied by abdominal pain.  Reports persistent diarrhea for the last week and 3 episode of vomiting.  No urine output.  He had Mounjaro  injection on 12/24/2023, which he thinks contributes to the abdominal pain.  He was still taking metformin , lisinopril , hydrochlorothiazide  and Jardiance .  Patient was found to have severe metabolic acidosis with a pH of 6.9, lactic acid of 10, creatinine of 12, glucose 395.  Patient was admitted for DKA, acute renal failure.  Patient was noted to have Kussmaul respiration, tachycardia.  He was admitted by CCM, nephrology was consulted.  He required CRRT but subsequently was discontinued.  He was transiently on pressors.  GI pathogen positive for Salmonella.  He was started on ceftriaxone .  Significant hospital events: 7/22 admitted to [facility], CRRT started due to worsening metabolic acidosis  7/23 GIPP positive salmonella, EPEC, ETEC. Ceftriaxone  started 7/25 CRRT stopped, Off pressors     Assessment & Plan:   Principal Problem:   DKA (diabetic ketoacidosis) (HCC) Active Problems:   AKI (acute kidney injury) (HCC)   Sepsis (HCC)   Hyponatremia  1-DKA, diabetes type 2: Metabolic acidosis: -Patient was treated with IV fluids, insulin  drip. - He has been transitioned to Semglee   30 units and NovoLog  4 units 3 times daily Adjust insulin  as needed.  Gap elevated ,but Bicarb 20---will hydrate and repeat B-met this afternoon. He got some breeze.   Lactic acidosis, septic shock, metabolic acidosis -Septic shock in the setting of gastroenteritis, presented with hypotension, hypothermia tachycardia, leukocytosis.  Source of  infection gastroenteritis. -Metabolic acidosis thought to be related to metformin  induced lactic acidosis, AKI and DKA. -Lactic acid more than 9 - He has been off pressors Acidosis resolved.   Gastroenteritis,Salmonella, ETEC, EPEC -Presented with a week of nausea vomiting diarrhea abdominal pain. - GI pathogen positive for Salmonella enteropathogenic E. coli and enterotoxigenic E. Coli -C. difficile negative - He was started on IV ceftriaxone  -report having 5 BM yesterday, will resume IV fluids, will add flagyl .   Hypokalemia; replete IV and orally.  Hypomagnesemia; replete IV>   A-fib with RVR status post amiodarone  drip   Acute renal failure -Presented with a creatinine of 11.2 -Prerenal secondary to GI losses, ACE and hydrochlorothiazide  use, Jardiance  use - Nephrology was consulted, patient underwent CRRT from 7/22; until 7/25, subsequently discontinued CRRT. -Creatinine down to 0.8, and has remained stable  Hypophosphatemia: Replace  Chronic back pain -pain management.    Nutrition Problem: Inadequate oral intake Etiology: acute illness, nausea, vomiting, poor appetite    Signs/Symptoms: per patient/family report    Interventions: Refer to RD note for recommendations  Estimated body mass index is 28.64 kg/m as calculated from the following:   Height as of this encounter: 6' (1.829 m).   Weight as of this encounter: 95.8 kg.   DVT prophylaxis: Lovenox  Code Status: Full code Family Communication: Care discussed with patient.  Disposition Plan:  Status is: Inpatient Remains inpatient appropriate because: management of shock, renal failure. Stable for rehab.     Consultants:  Nephrology CCM  Procedures:  none  Antimicrobials:    Subjective: Had 5 BM yesterday  sill watery.  Denies abdominal pain   Objective: Vitals:   01/06/24 2102  01/07/24 0449 01/07/24 0500 01/07/24 0909  BP:  (!) 123/57  132/75  Pulse:  90  97  Resp:  18  17  Temp:  99.4 F (37.4 C) 99.4 F (37.4 C)  97.9 F (36.6 C)  TempSrc: Oral Oral  Oral  SpO2:  99%  100%  Weight:   95.8 kg   Height:        Intake/Output Summary (Last 24 hours) at 01/07/2024 1158 Last data filed at 01/07/2024 0400 Gross per 24 hour  Intake 1097 ml  Output 575 ml  Net 522 ml   Filed Weights   01/05/24 0622 01/06/24 0416 01/07/24 0500  Weight: 96.4 kg 95.2 kg 95.8 kg    Examination:  General exam: NAD Respiratory system: CTA Cardiovascular system: S 1, S 2 RRR Gastrointestinal system: Bs present, soft, nt Central nervous system: Alert Extremities: no edema   Data Reviewed: I have personally reviewed following labs and imaging studies  CBC: Recent Labs  Lab 01/01/24 1410 01/01/24 1440 01/02/24 0451 01/02/24 0649 01/03/24 1612 01/04/24 0324 01/05/24 0548 01/06/24 0719 01/07/24 0732  WBC 16.4*   < > 8.9  --  18.2* 14.9* 12.3* 16.0* 16.7*  NEUTROABS 12.5*  --  7.7  --   --   --   --   --   --   HGB 12.7*   < > 13.0   < > 10.4* 10.2* 9.7* 10.3* 9.8*  HCT 39.7   < > 39.9   < > 31.0* 30.7* 29.7* 32.1* 30.9*  MCV 76.8*   < > 74.2*  --  72.9* 72.4* 75.0* 74.8* 76.7*  PLT 406*   < > 259  --  174 172 130* 139* 136*   < > = values in this interval not displayed.   Basic Metabolic Panel: Recent Labs  Lab 01/03/24 0421 01/03/24 0422 01/04/24 0324 01/04/24 1550 01/05/24 0548 01/05/24 1629 01/06/24 0719 01/07/24 0732  NA  --    < > 133*  136 134* 134* 135 140 137  K  --    < > 3.5  3.6 3.2* 3.3* 3.2* 3.2* 2.8*  CL  --    < > 88*  90* 92* 91* 97* 102 99  CO2  --    < > 21*  20* 21* 28 26 23  20*  GLUCOSE  --    < > 283*  285* 289* 276* 288* 149* 222*  BUN  --    < > 21  21 22 19 23 17 12   CREATININE  --    < > 1.49*  1.49* 1.51* 1.00 0.87 0.85 0.74  CALCIUM   --    < > 7.5*  7.5* 7.7* 8.0* 8.3* 8.4* 8.5*  MG 2.4  --  2.6*  --  2.2  --  1.7 1.5*  PHOS  --    < > 3.0 2.7 2.5 1.3* 2.5 3.6   < > = values in this interval not displayed.    GFR: Estimated Creatinine Clearance: 109.1 mL/min (by C-G formula based on SCr of 0.74 mg/dL). Liver Function Tests: Recent Labs  Lab 01/01/24 1410 01/01/24 2043 01/02/24 0452 01/04/24 1550 01/05/24 0548 01/05/24 1629 01/06/24 0719 01/07/24 0732  AST 34 39  --   --   --   --   --   --   ALT 21 23  --   --   --   --   --   --   ALKPHOS 117 129*  --   --   --   --   --   --  BILITOT 0.5 0.7  --   --   --   --   --   --   PROT 6.4* 6.1*  --   --   --   --   --   --   ALBUMIN 2.5* 2.4*   < > 1.9* 2.0* 2.2* 2.4* 2.3*   < > = values in this interval not displayed.   Recent Labs  Lab 01/01/24 1410  LIPASE 69*   No results for input(s): AMMONIA in the last 168 hours. Coagulation Profile: Recent Labs  Lab 01/01/24 1410  INR 1.3*   Cardiac Enzymes: No results for input(s): CKTOTAL, CKMB, CKMBINDEX, TROPONINI in the last 168 hours. BNP (last 3 results) No results for input(s): PROBNP in the last 8760 hours. HbA1C: No results for input(s): HGBA1C in the last 72 hours. CBG: Recent Labs  Lab 01/06/24 0817 01/06/24 1159 01/06/24 1609 01/06/24 2059 01/07/24 0858  GLUCAP 234* 193* 211* 158* 199*   Lipid Profile: No results for input(s): CHOL, HDL, LDLCALC, TRIG, CHOLHDL, LDLDIRECT in the last 72 hours. Thyroid  Function Tests: No results for input(s): TSH, T4TOTAL, FREET4, T3FREE, THYROIDAB in the last 72 hours. Anemia Panel: No results for input(s): VITAMINB12, FOLATE, FERRITIN, TIBC, IRON, RETICCTPCT in the last 72 hours. Sepsis Labs: Recent Labs  Lab 01/01/24 1440 01/01/24 2043  LATICACIDVEN 10.7* >9.0*    Recent Results (from the past 240 hours)  MRSA Next Gen by PCR, Nasal     Status: None   Collection Time: 01/01/24  4:06 PM   Specimen: Nasal Mucosa; Nasal Swab  Result Value Ref Range Status   MRSA by PCR Next Gen NOT DETECTED NOT DETECTED Final    Comment: (NOTE) The GeneXpert MRSA Assay (FDA approved for  NASAL specimens only), is one component of a comprehensive MRSA colonization surveillance program. It is not intended to diagnose MRSA infection nor to guide or monitor treatment for MRSA infections. Test performance is not FDA approved in patients less than 19 years old. Performed at [facility], [address], [place], [place] [address]   Resp panel by RT-PCR (RSV, Flu A&B, Covid) Urine, Clean Catch     Status: None   Collection Time: 01/01/24  4:07 PM   Specimen: Urine, Clean Catch; Nasal Swab  Result Value Ref Range Status   SARS Coronavirus 2 by RT PCR NEGATIVE NEGATIVE Final   Influenza A by PCR NEGATIVE NEGATIVE Final   Influenza B by PCR NEGATIVE NEGATIVE Final    Comment: (NOTE) The Xpert Xpress SARS-CoV-2/FLU/RSV plus assay is intended as an aid in the diagnosis of influenza from Nasopharyngeal swab specimens and should not be used as a sole basis for treatment. Nasal washings and aspirates are unacceptable for Xpert Xpress SARS-CoV-2/FLU/RSV testing.  Fact Sheet for Patients: BloggerCourse.com  Fact Sheet for Healthcare Providers: SeriousBroker.it  This test is not yet approved or cleared by the [place]  FDA and has been authorized for detection and/or diagnosis of SARS-CoV-2 by FDA under an Emergency Use Authorization (EUA). This EUA will remain in effect (meaning this test can be used) for the duration of the COVID-19 declaration under Section 564(b)(1) of the Act, 21 U.S.C. section 360bbb-3(b)(1), unless the authorization is terminated or revoked.     Resp Syncytial Virus by PCR NEGATIVE NEGATIVE Final    Comment: (NOTE) Fact Sheet for Patients: BloggerCourse.com  Fact Sheet for Healthcare Providers: SeriousBroker.it  This test is not yet approved or cleared by the [place]  FDA and has  been authorized for detection and/or diagnosis of  SARS-CoV-2 by FDA under an Emergency Use Authorization (EUA). This EUA will remain in effect (meaning this test can be used) for the duration of the COVID-19 declaration under Section 564(b)(1) of the Act, 21 U.S.C. section 360bbb-3(b)(1), unless the authorization is terminated or revoked.  Performed at [facility], [address] [place], [place] [address]   Gastrointestinal Panel by PCR , Stool     Status: Abnormal   Collection Time: 01/02/24  2:30 AM   Specimen: Stool  Result Value Ref Range Status   Campylobacter species NOT DETECTED NOT DETECTED Final   Plesimonas shigelloides NOT DETECTED NOT DETECTED Final   Salmonella species DETECTED (A) NOT DETECTED Final    Comment: RESULT CALLED TO, READ BACK BY AND VERIFIED WITH: YENI GRANADOS 1055 01/02/2024 DLB    Yersinia enterocolitica NOT DETECTED NOT DETECTED Final   Vibrio species NOT DETECTED NOT DETECTED Final   Vibrio cholerae NOT DETECTED NOT DETECTED Final   Enteroaggregative E coli (EAEC) NOT DETECTED NOT DETECTED Final   Enteropathogenic E coli (EPEC) DETECTED (A) NOT DETECTED Final    Comment: RESULT CALLED TO, READ BACK BY AND VERIFIED WITH: YENI GRANADOS 1055 01/02/2024 DLB    Enterotoxigenic E coli (ETEC) DETECTED (A) NOT DETECTED Final    Comment: RESULT CALLED TO, READ BACK BY AND VERIFIED WITH: YENI GRANADOS 1055 01/02/2024 DLB    Shiga like toxin producing E coli (STEC) NOT DETECTED NOT DETECTED Final   Shigella/Enteroinvasive E coli (EIEC) NOT DETECTED NOT DETECTED Final   Cryptosporidium NOT DETECTED NOT DETECTED Final   Cyclospora cayetanensis NOT DETECTED NOT DETECTED Final   Entamoeba histolytica NOT DETECTED NOT DETECTED Final   Giardia lamblia NOT DETECTED NOT DETECTED Final   Adenovirus F40/41 NOT DETECTED NOT DETECTED Final   Astrovirus NOT DETECTED NOT DETECTED Final   Norovirus GI/GII NOT DETECTED NOT DETECTED Final   Rotavirus A NOT DETECTED NOT DETECTED Final   Sapovirus (I, II, IV, and V)  NOT DETECTED NOT DETECTED Final    Comment: Performed at [facility], [address] [place], [place] [address]  C Difficile Quick Screen w PCR reflex     Status: None   Collection Time: 01/02/24  2:30 AM  Result Value Ref Range Status   C Diff antigen NEGATIVE NEGATIVE Final   C Diff toxin NEGATIVE NEGATIVE Final   C Diff interpretation No C. difficile detected.  Final    Comment: Performed at [facility], [address] [place], [place] [address]  Urine Culture     Status: None   Collection Time: 01/02/24  4:00 AM   Specimen: Urine, Random  Result Value Ref Range Status   Specimen Description URINE, RANDOM  Final   Special Requests NONE Reflexed from [id number]  Final   Culture   Final    NO GROWTH Performed at [facility], [address], [place], [place] [address]    Report Status 01/03/2024 FINAL  Final         Radiology Studies: No results found.      Scheduled Meds:  (feeding supplement) PROSource Plus  30 mL Oral BID BM   enoxaparin  (LOVENOX ) injection  40 mg Subcutaneous Q24H   feeding supplement (GLUCERNA SHAKE)  237 mL Oral TID BM   insulin  aspart  0-15 Units Subcutaneous TID WC   insulin  aspart  0-5 Units Subcutaneous QHS   insulin  aspart  4 Units Subcutaneous TID  WC   insulin  glargine-yfgn  30 Units Subcutaneous Daily   multivitamin with minerals  1 tablet Oral Daily   potassium chloride   40 mEq Oral Q4H   Continuous Infusions:  cefTRIAXone  (ROCEPHIN )  IV 2 g (01/07/24 0957)   lactated ringers      magnesium  sulfate bolus IVPB     metronidazole      potassium chloride      sodium chloride        LOS: 6 days    Time spent: 35 Minutes    Lamir Racca A Albena Comes, MD [facility]   If 7PM-7AM, please contact night-coverage [email or web address]  01/07/2024, 11:58 AM

## 2024-01-07 NOTE — Progress Notes (Signed)
 Physical Therapy Treatment Patient Details Name: Riley Clarke MRN: [id number] DOB: 12/14/1955 Today's Date: 01/07/2024   History of Present Illness 68 yo male presents to ED 7/22 via EMS with N/V/D for approxmiately the 7 days and abdominal pain week following Mounjaro  injection. Pt found to have metabolic acidosis, lactic acidosis, DKA, shock, and acute renal failure. Placed on CRRT. 7/25 removed from CRRT 7/25 PMH: diabetes 2, HLD, HTN, Chronic Back pain, and Arthritis .    PT Comments  Patient progressing well with ambulation in hallway and able to demonstrate straight path without LOB despite head turn.  Still with cues for safety with hand placement for transfers and for proximity to walker.  Education initiated for energy conservation and states his sister with be on point to help him at d/c.  PT will continue to follow while in the acute setting.     If plan is discharge home, recommend the following: A little help with walking and/or transfers;Help with stairs or ramp for entrance;A little help with bathing/dressing/bathroom   Can travel by private vehicle        Equipment Recommendations  BSC/3in1    Recommendations for Other Services       Precautions / Restrictions Precautions Precautions: Fall     Mobility  Bed Mobility Overal bed mobility: Modified Independent                  Transfers Overall transfer level: Needs assistance Equipment used: Rolling walker (2 wheels) Transfers: Sit to/from Stand Sit to Stand: Supervision           General transfer comment: cues for hand placement    Ambulation/Gait Ambulation/Gait assistance: Supervision Gait Distance (Feet): 150 Feet Assistive device: Rolling walker (2 wheels) Gait Pattern/deviations: Step-through pattern, Decreased stride length, Wide base of support, Trunk flexed       General Gait Details: increased proximity to walker at times, cues for staying inside; noted head turn to R as walking  forward without veering or LOB   Stairs             Wheelchair Mobility     Tilt Bed    Modified Rankin (Stroke Patients Only)       Balance Overall balance assessment: Needs assistance   Sitting balance-Leahy Scale: Good     Standing balance support: No upper extremity supported Standing balance-Leahy Scale: Fair                              Hotel manager: No apparent difficulties  Cognition Arousal: Alert Behavior During Therapy: WFL for tasks assessed/performed   PT - Cognitive impairments: No apparent impairments                         Following commands: Intact      Cueing    Exercises      General Comments General comments (skin integrity, edema, etc.): no c/o dizziness today, mild dyspnea; declined seated rest; discussed energy conservation tips due to reports limited stamina      Pertinent Vitals/Pain Pain Assessment Pain Assessment: No/denies pain    Home Living                          Prior Function            PT Goals (current goals can now be found in the care plan section)  Progress towards PT goals: Progressing toward goals    Frequency    Min 3X/week      PT Plan      Co-evaluation              AM-PAC PT 6 Clicks Mobility   Outcome Measure  Help needed turning from your back to your side while in a flat bed without using bedrails?: None Help needed moving from lying on your back to sitting on the side of a flat bed without using bedrails?: None Help needed moving to and from a bed to a chair (including a wheelchair)?: None Help needed standing up from a chair using your arms (e.g., wheelchair or bedside chair)?: A Little Help needed to walk in hospital room?: A Little Help needed climbing 3-5 steps with a railing? : A Little 6 Click Score: 21    End of Session Equipment Utilized During Treatment: Gait belt Activity Tolerance: Patient tolerated  treatment well Patient left: in bed;with call bell/phone within reach   PT Visit Diagnosis: Other abnormalities of gait and mobility (R26.89);Muscle weakness (generalized) (M62.81);History of falling (Z91.81)     Time: 8597-8579 PT Time Calculation (min) (ACUTE ONLY): 18 min  Charges:    $Gait Training: 8-22 mins PT General Charges $$ ACUTE PT VISIT: 1 Visit                     Riley Clarke, PT Acute Rehabilitation Services [phone] 01/07/2024    Riley Clarke 01/07/2024, 2:26 PM

## 2024-01-07 NOTE — Telephone Encounter (Signed)
 Patient Product/process development scientist completed.    The patient is insured through [facility]. Patient has Medicare and is not eligible for a copay card, but may be able to apply for patient assistance or Medicare RX Payment Plan (Patient Must reach out to their plan, if eligible for payment plan), if available.    Ran test claim for Lantus Pen and the current 30 day co-pay is $0.00.  Ran test claim for Humalog KwikPen and the current 30 day co-pay is $0.00.  This test claim was processed through [facility]- copay amounts may vary at other pharmacies due to pharmacy/plan contracts, or as the patient moves through the different stages of their insurance plan.     Roland Earl, CPHT Pharmacy Technician III Certified Patient Advocate [facility] Patient Advocate Team Direct Number: [phone]  Fax: [phone]

## 2024-01-08 ENCOUNTER — Other Ambulatory Visit (HOSPITAL_COMMUNITY): Payer: Self-pay

## 2024-01-08 ENCOUNTER — Telehealth (HOSPITAL_COMMUNITY): Payer: Self-pay | Admitting: Pharmacy Technician

## 2024-01-08 DIAGNOSIS — E1310 Other specified diabetes mellitus with ketoacidosis without coma: Secondary | ICD-10-CM | POA: Diagnosis not present

## 2024-01-08 LAB — RENAL FUNCTION PANEL
Albumin: 2.4 g/dL — ABNORMAL LOW (ref 3.5–5.0)
Anion gap: 14 (ref 5–15)
BUN: 12 mg/dL (ref 8–23)
CO2: 16 mmol/L — ABNORMAL LOW (ref 22–32)
Calcium: 8.4 mg/dL — ABNORMAL LOW (ref 8.9–10.3)
Chloride: 106 mmol/L (ref 98–111)
Creatinine, Ser: 0.61 mg/dL (ref 0.61–1.24)
GFR, Estimated: >60 mL/min (ref >60)
Glucose, Bld: 130 mg/dL — ABNORMAL HIGH (ref 70–99)
Phosphorus: 2.9 mg/dL (ref 2.5–4.6)
Potassium: 3.6 mmol/L (ref 3.5–5.1)
Sodium: 136 mmol/L (ref 135–145)

## 2024-01-08 LAB — CBC
HCT: 33.1 % — ABNORMAL LOW (ref 39.0–52.0)
Hemoglobin: 10.4 g/dL — ABNORMAL LOW (ref 13.0–17.0)
MCH: 24.9 pg — ABNORMAL LOW (ref 26.0–34.0)
MCHC: 31.4 g/dL (ref 30.0–36.0)
MCV: 79.4 fL — ABNORMAL LOW (ref 80.0–100.0)
Platelets: 209 K/uL (ref 150–400)
RBC: 4.17 MIL/uL — ABNORMAL LOW (ref 4.22–5.81)
RDW: 17.6 % — ABNORMAL HIGH (ref 11.5–15.5)
WBC: 14.6 K/uL — ABNORMAL HIGH (ref 4.0–10.5)
nRBC: 0.1 % (ref 0.0–0.2)

## 2024-01-08 LAB — GLUCOSE, CAPILLARY
Glucose-Capillary: 185 mg/dL — ABNORMAL HIGH (ref 70–99)
Glucose-Capillary: 229 mg/dL — ABNORMAL HIGH (ref 70–99)
Glucose-Capillary: 185 mg/dL — ABNORMAL HIGH (ref 70–99)
Glucose-Capillary: 195 mg/dL — ABNORMAL HIGH (ref 70–99)
Glucose-Capillary: 179 mg/dL — ABNORMAL HIGH (ref 70–99)
Glucose-Capillary: 192 mg/dL — ABNORMAL HIGH (ref 70–99)

## 2024-01-08 LAB — MAGNESIUM: Magnesium: 1.7 mg/dL (ref 1.7–2.4)

## 2024-01-08 MED ORDER — MAGNESIUM SULFATE 2 GM/50ML IV SOLN
2.0000 g | Freq: Once | INTRAVENOUS | Status: AC
  Administered 2024-01-08: 2 g via INTRAVENOUS
  Filled 2024-01-08: qty 50

## 2024-01-08 MED ORDER — POTASSIUM CHLORIDE CRYS ER 20 MEQ PO TBCR
40.0000 meq | EXTENDED_RELEASE_TABLET | Freq: Once | ORAL | Status: AC
  Administered 2024-01-08: 40 meq via ORAL
  Filled 2024-01-08: qty 2

## 2024-01-08 MED ORDER — LACTATED RINGERS IV SOLN: INTRAVENOUS | Status: DC

## 2024-01-08 MED ORDER — SODIUM BICARBONATE 650 MG PO TABS
1300.0000 mg | ORAL_TABLET | Freq: Three times a day (TID) | ORAL | Status: DC
  Administered 2024-01-08 (×3): 1300 mg via ORAL
  Filled 2024-01-08 (×3): qty 2

## 2024-01-08 NOTE — Inpatient Diabetes Management (Signed)
 Inpatient Diabetes Program Recommendations  AACE/ADA: New Consensus Statement on Inpatient Glycemic Control (2015)  Target Ranges:  Prepandial:   less than 140 mg/dL      Peak postprandial:   less than 180 mg/dL (1-2 hours)      Critically ill patients:  140 - 180 mg/dL   Lab Results  Component Value Date   GLUCAP 229 (H) 01/08/2024   HGBA1C 10.3 (A) 10/15/2023    Review of Glycemic Control  Latest Reference Range & Units 01/07/24 08:58 01/07/24 12:22 01/07/24 16:48 01/07/24 19:54 01/07/24 21:50 01/08/24 07:47  Glucose-Capillary 70 - 99 mg/dL 800 (H) 611 (H) 868 (H) 185 (H) 191 (H) 229 (H)  (H): Data is abnormally high  Diabetes history: DM2 Outpatient Diabetes medications: Jardiance  25 mg every day, Tradjenta  5 mg every day, Metformin  1000 mg BID, Mounjaro  7.5 mg weekly Current orders for Inpatient glycemic control: Semglee  30 units every day, Novolog  0-15 units TID and 0-5 units at bedtime,  Novolog  4 units TID  Inpatient Diabetes Program Recommendations:    Semglee  35 units every day  Will meet with patient this morning to teach insulin .    Thank you, Wyvonna Pinal, MSN, CDCES Diabetes Coordinator Inpatient Diabetes Program [phone] (team pager from 8a-5p)

## 2024-01-08 NOTE — TOC Initial Note (Signed)
 Transition of Care (TOC) - Initial/Assessment Note   Spoke to patient at bedside. Patient from home alone, however his sister is available if he needs assistance.   Discussed PT/OT recommendations: no follow up , recommend bedside commode, rolling walker and tub bench / shower chair.   Patient  states he has tub bench /shower chair but does need bedside commode and rolling walker. NCM entered orders and progress note for DME and called Arthea with [employer]  Patient Details  Name: Riley Clarke MRN: [id number] Date of Birth: [birth year]  Transition of Care [facility]) CM/SW Contact:    Stephane Powell Jansky, RN Phone Number: 01/08/2024, 8:27 AM  Clinical Narrative:                   Expected Discharge Plan: Home/Self Care Barriers to Discharge: Continued Medical Work up   Patient Goals and CMS Choice Patient states their goals for this hospitalization and ongoing recovery are:: to return to home CMS Medicare.gov Compare Post Acute Care list provided to:: Patient Choice offered to / list presented to : Patient      Expected Discharge Plan and Services   Discharge Planning Services: CM Consult Post Acute Care Choice: Durable Medical Equipment Living arrangements for the past 2 months: Single Family Home                 DME Arranged: 3-N-1, Walker rolling DME Agency: [employer] Date DME Agency Contacted: 01/08/24 Time DME Agency Contacted: [phone] Representative spoke with at DME Agency: Arthea HH Arranged: NA HH Agency: NA        Prior Living Arrangements/Services Living arrangements for the past 2 months: Single Family Home Lives with:: Self Patient language and need for interpreter reviewed:: Yes Do you feel safe going back to the place where you live?: Yes      Need for Family Participation in Patient Care: No (Comment) Care giver support system in place?: Yes (comment) Current home services: DME (cane) Criminal Activity/Legal Involvement Pertinent to Current  Situation/Hospitalization: No - Comment as needed  Activities of Daily Living   ADL Screening (condition at time of admission) Independently performs ADLs?: Yes (appropriate for developmental age) Is the patient deaf or have difficulty hearing?: No Does the patient have difficulty seeing, even when wearing glasses/contacts?: No Does the patient have difficulty concentrating, remembering, or making decisions?: No  Permission Sought/Granted   Permission granted to share information with : Yes, Verbal Permission Granted     Permission granted to share info w AGENCY: [employer]        Emotional Assessment Appearance:: Appears stated age Attitude/Demeanor/Rapport: Engaged Affect (typically observed): Appropriate Orientation: : Oriented to Self, Oriented to Place, Oriented to  Time, Oriented to Situation Alcohol / Substance Use: Not Applicable Psych Involvement: No (comment)  Admission diagnosis:  DKA (diabetic ketoacidosis) (HCC) [E11.10] Patient Active Problem List   Diagnosis Date Noted   Hyponatremia 01/03/2024   Sepsis (HCC) 01/02/2024   DKA (diabetic ketoacidosis) (HCC) 01/01/2024   AKI (acute kidney injury) (HCC) 01/01/2024   Chronic thoracic back pain 10/08/2023   Colon cancer screening 01/12/2023   Hypertension due to endocrine disorder 10/11/2022   Cervical spondylosis with myelopathy and radiculopathy 09/05/2022   Lumbar stenosis with neurogenic claudication 03/09/2021   Bloating symptom 10/07/2020   Diastasis recti 10/07/2020   Nocturia 10/07/2020   Abdominal distension 08/24/2020   Body mass index (BMI) 30.0-30.9, adult 09/16/2019   Elevated blood-pressure reading, without diagnosis of hypertension 09/16/2019   Cervical radiculitis 02/21/2016  Opioid dependence (HCC) 02/21/2016   Displacement of lumbar intervertebral disc without myelopathy 04/30/2013   Diabetes (HCC) 09/12/2012   Spinal stenosis of lumbar region 09/10/2012   PCP:  [name], NP Pharmacy:    [facility] [phone] [address], [place] - [address] AT [facility] [address] [place] [address] Phone: [phone] Fax: [phone]  [facility] [address] [place] [address] Phone: [phone] Fax: [phone]     Social Drivers of Health (SDOH) Social History: SDOH Screenings   Food Insecurity: No Food Insecurity (01/06/2024)  Housing: Low Risk  (01/06/2024)  Transportation Needs: No Transportation Needs (01/06/2024)  Utilities: Not At Risk (01/06/2024)  Alcohol Screen: Low Risk  (12/25/2023)  Depression (PHQ2-9): Low Risk  (12/27/2023)  Financial Resource Strain: Low Risk  (12/25/2023)  Physical Activity: Inactive (12/25/2023)  Social Connections: Unknown (01/06/2024)  Stress: No Stress Concern Present (12/25/2023)  Tobacco Use: Medium Risk (01/07/2024)   SDOH Interventions:     Readmission Risk Interventions     No data to display

## 2024-01-08 NOTE — TOC CM/SW Note (Signed)
    Durable Medical Equipment  (From admission, onward)           Start     Ordered   01/08/24 0825  For home use only DME Walker rolling  Once       Question Answer Comment  Walker: With 5 Inch Wheels   Patient needs a walker to treat with the following condition Weakness      01/08/24 0825   01/08/24 0825  For home use only DME Bedside commode  Once       Question:  Patient needs a bedside commode to treat with the following condition  Answer:  Weakness   01/08/24 0825           Patient confined to a room with no bathroom , therefore needs a bedside commode.

## 2024-01-08 NOTE — Telephone Encounter (Signed)
 Patient Product/process development scientist completed.    The patient is insured through [facility]. Patient has Medicare and is not eligible for a copay card, but may be able to apply for patient assistance or Medicare RX Payment Plan (Patient Must reach out to their plan, if eligible for payment plan), if available.    Ran test claim for [employer] and Requires Prior Authorization  Ran test claim for [employer] and Requires Prior Authorization  This test claim was processed through [employer]- copay amounts may vary at other pharmacies due to [employer], or as the patient moves through the different stages of their insurance plan.     Morgan Arab, CPHT Pharmacy Technician III Certified Patient Advocate [facility] Patient Advocate Team Direct Number: [phone]  Fax: [phone]

## 2024-01-08 NOTE — Plan of Care (Signed)
  Problem: Education: Goal: Ability to describe self-care measures that may prevent or decrease complications (Diabetes Survival Skills Education) will improve Outcome: Progressing   Problem: Fluid Volume: Goal: Ability to maintain a balanced intake and output will improve Outcome: Progressing   Problem: Health Behavior/Discharge Planning: Goal: Ability to manage health-related needs will improve Outcome: Progressing

## 2024-01-08 NOTE — Inpatient Diabetes Management (Addendum)
 Inpatient Diabetes Program Recommendations  AACE/ADA: New Consensus Statement on Inpatient Glycemic Control (2015)  Target Ranges:  Prepandial:   less than 140 mg/dL      Peak postprandial:   less than 180 mg/dL (1-2 hours)      Critically ill patients:  140 - 180 mg/dL   Lab Results  Component Value Date   GLUCAP 229 (H) 01/08/2024   HGBA1C 10.3 (A) 10/15/2023    Met with patient at bedside.  Educated patient on insulin  pen use at home. Reviewed contents of insulin  flexpen starter kit. Reviewed all steps of insulin  pen including attachment of needle, 2-unit air shot, dialing up dose, giving injection, removing needle, disposal of sharps, storage of unused insulin , disposal of insulin  etc. Patient able to provide successful return demonstration. Also reviewed troubleshooting with insulin  pen. MD to give patient Rxs for insulin  pens and insulin  pen needles.  Attached a video of insulin  pen administration and DM education to exit care.    Humalog  and Lantus  are covered at 100%.  Reviewed hypoglycemia, < 70 mg/dL, signs, symptoms and treatments.  Prior Auth for the [employer] 3 Plus is pending.  Will follow.    Discharge Recommendations: Long acting recommendations: Insulin  Glargine (LANTUS ) Solostar Pen determine at DC  Short acting recommendations:  Meal + Correction coverage Insulin  lispro (HUMALOG ) KwikPen  Sensitive Scale.  determin at DC   Use Adult Diabetes Insulin  Treatment Post Discharge order set.

## 2024-01-08 NOTE — Telephone Encounter (Signed)
 Pharmacy Patient Advocate Encounter   Received notification from Inpatient Request that prior authorization for FreeStyle Libre 3 Plus Sensor is required/requested.   Insurance verification completed.   The patient is insured through [employer] .   Per test claim: PA required; PA submitted to above mentioned insurance via CoverMyMeds Key/confirmation #/EOC [facility] Status is pending

## 2024-01-08 NOTE — Progress Notes (Signed)
 Mobility Specialist Progress Note:    01/08/24 1225  Mobility  Activity Ambulated with assistance (In hallway)  Level of Assistance Standby assist, set-up cues, supervision of patient - no hands on  Assistive Device Front wheel walker  Distance Ambulated (ft) 150 ft  Activity Response Tolerated well  Mobility Referral Yes  Mobility visit 1 Mobility  Mobility Specialist Start Time (ACUTE ONLY) 1203  Mobility Specialist Stop Time (ACUTE ONLY) 1213  Mobility Specialist Time Calculation (min) (ACUTE ONLY) 10 min   Received pt in bed and agreeable to mobility. No physical assistance needed. No c/o throughout. Returned to room and left EOB to eat lunch. Personal belongings and call light within reach. All needs met.  Lavanda Pollack Mobility Specialist  Please contact via [employer] or  Rehab Office [phone]

## 2024-01-08 NOTE — Telephone Encounter (Signed)
 Pharmacy Patient Advocate Encounter  Received notification from [facility] that Prior Authorization for FreeStyle Libre 3 Plus Sensor  has been APPROVED from 01/08/2024 to 06/11/2024. Ran test claim, Copay is $0.00. This test claim was processed through [facility]- copay amounts may vary at other pharmacies due to pharmacy/plan contracts, or as the patient moves through the different stages of their insurance plan.   PA #/Case ID/Reference #: [id number]

## 2024-01-08 NOTE — Progress Notes (Signed)
 PROGRESS NOTE    Riley Clarke  FMW:[id number] DOB: May 06, 1956 DOA: 01/01/2024 PCP: [name], NP   Brief Narrative: 68 year old with past medical history significant for diabetes type 2, hypertension, hyperlipidemia, chronic back pain, arthritis presented by EMS from home with nausea vomiting diarrhea approximately for 7 days, accompanied by abdominal pain.  Reports persistent diarrhea for the last week and 3 episode of vomiting.  No urine output.  He had Mounjaro  injection on 12/24/2023, which he thinks contributes to the abdominal pain.  He was still taking metformin , lisinopril , hydrochlorothiazide  and Jardiance .  Patient was found to have severe metabolic acidosis with a pH of 6.9, lactic acid of 10, creatinine of 12, glucose 395.  Patient was admitted for DKA, acute renal failure.  Patient was noted to have Kussmaul respiration, tachycardia.  He was admitted by CCM, nephrology was consulted.  He required CRRT but subsequently was discontinued.  He was transiently on pressors.  GI pathogen positive for Salmonella.  He was started on ceftriaxone .  Significant hospital events: 7/22 admitted to [facility], CRRT started due to worsening metabolic acidosis  7/23 GIPP positive salmonella, EPEC, ETEC. Ceftriaxone  started 7/25 CRRT stopped, Off pressors   At this time patient is still having diarrhea, metabolic acidosis.  He was started on oral bicarb tablet.  Awaiting improvement of diarrhea and electrolyte abnormality to discharge home.  Assessment & Plan:   Principal Problem:   DKA (diabetic ketoacidosis) (HCC) Active Problems:   AKI (acute kidney injury) (HCC)   Sepsis (HCC)   Hyponatremia  1-DKA, diabetes type 2: Metabolic acidosis: -Patient was treated with IV fluids, insulin  drip. - He has been transitioned to Semglee   30 units and NovoLog  4 units 3 times daily Adjust insulin  as needed.  Anion Gap normal this morning.  Metabolic acidosis likely in the setting of  diarrhea Continue with insulin  regimen  Lactic acidosis, septic shock, metabolic acidosis -Septic shock in the setting of gastroenteritis, presented with hypotension, hypothermia tachycardia, leukocytosis.  Source of infection gastroenteritis. -Metabolic acidosis thought to be related to metformin  induced lactic acidosis, AKI and DKA. -Lactic acid more than 9 - He has been off pressors -Metabolic acidosis persist likely in the setting of diarrhea, started on bicarb tablet -Continue IV fluids.  Gastroenteritis,Salmonella, ETEC, EPEC -Presented with a week of nausea vomiting diarrhea abdominal pain. - GI pathogen positive for Salmonella enteropathogenic E. coli and enterotoxigenic E. Coli -C. difficile negative - He was started on IV ceftriaxone  -Added Flagyl  7/28 Still having multiples BM, loose.   Hypokalemia; Replete orally.  Hypomagnesemia; replete IV> check labs in am  A-fib with RVR status post amiodarone  drip   Acute renal failure -Presented with a creatinine of 11.2 -Prerenal secondary to GI losses, ACE and hydrochlorothiazide  use, Jardiance  use - Nephrology was consulted, patient underwent CRRT from 7/22; until 7/25, subsequently discontinued CRRT. -Creatinine down to 0.8, and has remained stable  Hypophosphatemia: Replace  Chronic back pain -pain management.    Nutrition Problem: Inadequate oral intake Etiology: acute illness, nausea, vomiting, poor appetite    Signs/Symptoms: per patient/family report    Interventions: Refer to RD note for recommendations  Estimated body mass index is 28.4 kg/m as calculated from the following:   Height as of this encounter: 6' (1.829 m).   Weight as of this encounter: 95 kg.   DVT prophylaxis: Lovenox  Code Status: Full code Family Communication: Care discussed with patient.  Disposition Plan:  Status is: Inpatient Remains inpatient appropriate because: management of shock, renal failure.  Stable for rehab.      Consultants:  Nephrology CCM  Procedures:  none  Antimicrobials:    Subjective: Had 6 BM yesterday, loose and watery. Denies worsening abdominal pain.   Objective: Vitals:   01/07/24 2301 01/08/24 0404 01/08/24 0405 01/08/24 0908  BP: 124/65 119/64  122/67  Pulse: 81 76  80  Resp: 18 18  18   Temp: 99.3 F (37.4 C) 97.8 F (36.6 C)  98.1 F (36.7 C)  TempSrc: Oral Oral  Oral  SpO2: 99% 100%  95%  Weight:   95 kg   Height:        Intake/Output Summary (Last 24 hours) at 01/08/2024 1100 Last data filed at 01/08/2024 0900 Gross per 24 hour  Intake 2143.27 ml  Output 800 ml  Net 1343.27 ml   Filed Weights   01/06/24 0416 01/07/24 0500 01/08/24 0405  Weight: 95.2 kg 95.8 kg 95 kg    Examination:  General exam: NAD Respiratory system: CTA Cardiovascular system:S 1, S 2  RRR Gastrointestinal system: BS present, soft, nt Central nervous system: alert Extremities: no edema   Data Reviewed: I have personally reviewed following labs and imaging studies  CBC: Recent Labs  Lab 01/01/24 1410 01/01/24 1440 01/02/24 0451 01/02/24 0649 01/04/24 0324 01/05/24 0548 01/06/24 0719 01/07/24 0732 01/08/24 0633  WBC 16.4*   < > 8.9   < > 14.9* 12.3* 16.0* 16.7* 14.6*  NEUTROABS 12.5*  --  7.7  --   --   --   --   --   --   HGB 12.7*   < > 13.0   < > 10.2* 9.7* 10.3* 9.8* 10.4*  HCT 39.7   < > 39.9   < > 30.7* 29.7* 32.1* 30.9* 33.1*  MCV 76.8*   < > 74.2*   < > 72.4* 75.0* 74.8* 76.7* 79.4*  PLT 406*   < > 259   < > 172 130* 139* 136* 209   < > = values in this interval not displayed.   Basic Metabolic Panel: Recent Labs  Lab 01/04/24 0324 01/04/24 1550 01/05/24 0548 01/05/24 1629 01/06/24 0719 01/07/24 0732 01/07/24 1922 01/08/24 0633  NA 133*  136   < > 134* 135 140 137 136 136  K 3.5  3.6   < > 3.3* 3.2* 3.2* 2.8* 3.4* 3.6  CL 88*  90*   < > 91* 97* 102 99 107 106  CO2 21*  20*   < > 28 26 23  20* 19* 16*  GLUCOSE 283*  285*   < > 276* 288*  149* 222* 201* 130*  BUN 21  21   < > 19 23 17 12 12 12   CREATININE 1.49*  1.49*   < > 1.00 0.87 0.85 0.74 0.62 0.61  CALCIUM  7.5*  7.5*   < > 8.0* 8.3* 8.4* 8.5* 8.0* 8.4*  MG 2.6*  --  2.2  --  1.7 1.5*  --  1.7  PHOS 3.0   < > 2.5 1.3* 2.5 3.6  --  2.9   < > = values in this interval not displayed.   GFR: Estimated Creatinine Clearance: 108.7 mL/min (by C-G formula based on SCr of 0.61 mg/dL). Liver Function Tests: Recent Labs  Lab 01/01/24 1410 01/01/24 2043 01/02/24 0452 01/05/24 0548 01/05/24 1629 01/06/24 0719 01/07/24 0732 01/08/24 0633  AST 34 39  --   --   --   --   --   --   ALT 21  23  --   --   --   --   --   --   ALKPHOS 117 129*  --   --   --   --   --   --   BILITOT 0.5 0.7  --   --   --   --   --   --   PROT 6.4* 6.1*  --   --   --   --   --   --   ALBUMIN 2.5* 2.4*   < > 2.0* 2.2* 2.4* 2.3* 2.4*   < > = values in this interval not displayed.   Recent Labs  Lab 01/01/24 1410  LIPASE 69*   No results for input(s): AMMONIA in the last 168 hours. Coagulation Profile: Recent Labs  Lab 01/01/24 1410  INR 1.3*   Cardiac Enzymes: No results for input(s): CKTOTAL, CKMB, CKMBINDEX, TROPONINI in the last 168 hours. BNP (last 3 results) No results for input(s): PROBNP in the last 8760 hours. HbA1C: No results for input(s): HGBA1C in the last 72 hours. CBG: Recent Labs  Lab 01/07/24 1222 01/07/24 1648 01/07/24 1954 01/07/24 2150 01/08/24 0747  GLUCAP 388* 131* 185* 191* 229*   Lipid Profile: No results for input(s): CHOL, HDL, LDLCALC, TRIG, CHOLHDL, LDLDIRECT in the last 72 hours. Thyroid  Function Tests: No results for input(s): TSH, T4TOTAL, FREET4, T3FREE, THYROIDAB in the last 72 hours. Anemia Panel: No results for input(s): VITAMINB12, FOLATE, FERRITIN, TIBC, IRON, RETICCTPCT in the last 72 hours. Sepsis Labs: Recent Labs  Lab 01/01/24 1440 01/01/24 2043  LATICACIDVEN 10.7* >9.0*     Recent Results (from the past 240 hours)  MRSA Next Gen by PCR, Nasal     Status: None   Collection Time: 01/01/24  4:06 PM   Specimen: Nasal Mucosa; Nasal Swab  Result Value Ref Range Status   MRSA by PCR Next Gen NOT DETECTED NOT DETECTED Final    Comment: (NOTE) The GeneXpert MRSA Assay (FDA approved for NASAL specimens only), is one component of a comprehensive MRSA colonization surveillance program. It is not intended to diagnose MRSA infection nor to guide or monitor treatment for MRSA infections. Test performance is not FDA approved in patients less than 49 years old. Performed at [facility], [address] [place], [place] [address]   Resp panel by RT-PCR (RSV, Flu A&B, Covid) Urine, Clean Catch     Status: None   Collection Time: 01/01/24  4:07 PM   Specimen: Urine, Clean Catch; Nasal Swab  Result Value Ref Range Status   SARS Coronavirus 2 by RT PCR NEGATIVE NEGATIVE Final   Influenza A by PCR NEGATIVE NEGATIVE Final   Influenza B by PCR NEGATIVE NEGATIVE Final    Comment: (NOTE) The Xpert Xpress SARS-CoV-2/FLU/RSV plus assay is intended as an aid in the diagnosis of influenza from Nasopharyngeal swab specimens and should not be used as a sole basis for treatment. Nasal washings and aspirates are unacceptable for Xpert Xpress SARS-CoV-2/FLU/RSV testing.  Fact Sheet for Patients: BloggerCourse.com  Fact Sheet for Healthcare Providers: SeriousBroker.it  This test is not yet approved or cleared by the [place]  FDA and has been authorized for detection and/or diagnosis of SARS-CoV-2 by FDA under an Emergency Use Authorization (EUA). This EUA will remain in effect (meaning this test can be used) for the duration of the COVID-19 declaration under Section 564(b)(1) of the Act, 21 U.S.C. section 360bbb-3(b)(1), unless the authorization is terminated or revoked.  Resp Syncytial Virus by PCR NEGATIVE  NEGATIVE Final    Comment: (NOTE) Fact Sheet for Patients: BloggerCourse.com  Fact Sheet for Healthcare Providers: SeriousBroker.it  This test is not yet approved or cleared by the [place]  FDA and has been authorized for detection and/or diagnosis of SARS-CoV-2 by FDA under an Emergency Use Authorization (EUA). This EUA will remain in effect (meaning this test can be used) for the duration of the COVID-19 declaration under Section 564(b)(1) of the Act, 21 U.S.C. section 360bbb-3(b)(1), unless the authorization is terminated or revoked.  Performed at [facility], [address], [place], [place] [address]   Gastrointestinal Panel by PCR , Stool     Status: Abnormal   Collection Time: 01/02/24  2:30 AM   Specimen: Stool  Result Value Ref Range Status   Campylobacter species NOT DETECTED NOT DETECTED Final   Plesimonas shigelloides NOT DETECTED NOT DETECTED Final   Salmonella species DETECTED (A) NOT DETECTED Final    Comment: RESULT CALLED TO, READ BACK BY AND VERIFIED WITH: YENI GRANADOS 1055 01/02/2024 DLB    Yersinia enterocolitica NOT DETECTED NOT DETECTED Final   Vibrio species NOT DETECTED NOT DETECTED Final   Vibrio cholerae NOT DETECTED NOT DETECTED Final   Enteroaggregative E coli (EAEC) NOT DETECTED NOT DETECTED Final   Enteropathogenic E coli (EPEC) DETECTED (A) NOT DETECTED Final    Comment: RESULT CALLED TO, READ BACK BY AND VERIFIED WITH: YENI GRANADOS 1055 01/02/2024 DLB    Enterotoxigenic E coli (ETEC) DETECTED (A) NOT DETECTED Final    Comment: RESULT CALLED TO, READ BACK BY AND VERIFIED WITH: YENI GRANADOS 1055 01/02/2024 DLB    Shiga like toxin producing E coli (STEC) NOT DETECTED NOT DETECTED Final   Shigella/Enteroinvasive E coli (EIEC) NOT DETECTED NOT DETECTED Final   Cryptosporidium NOT DETECTED NOT DETECTED Final   Cyclospora cayetanensis NOT DETECTED NOT DETECTED Final   Entamoeba  histolytica NOT DETECTED NOT DETECTED Final   Giardia lamblia NOT DETECTED NOT DETECTED Final   Adenovirus F40/41 NOT DETECTED NOT DETECTED Final   Astrovirus NOT DETECTED NOT DETECTED Final   Norovirus GI/GII NOT DETECTED NOT DETECTED Final   Rotavirus A NOT DETECTED NOT DETECTED Final   Sapovirus (I, II, IV, and V) NOT DETECTED NOT DETECTED Final    Comment: Performed at [facility], [address] [place], [place] [address]  C Difficile Quick Screen w PCR reflex     Status: None   Collection Time: 01/02/24  2:30 AM  Result Value Ref Range Status   C Diff antigen NEGATIVE NEGATIVE Final   C Diff toxin NEGATIVE NEGATIVE Final   C Diff interpretation No C. difficile detected.  Final    Comment: Performed at [facility], [address] [place], [place] [address]  Urine Culture     Status: None   Collection Time: 01/02/24  4:00 AM   Specimen: Urine, Random  Result Value Ref Range Status   Specimen Description URINE, RANDOM  Final   Special Requests NONE Reflexed from [id number]  Final   Culture   Final    NO GROWTH Performed at [facility], [address] [place], [place] [address]    Report Status 01/03/2024 FINAL  Final         Radiology Studies: No results found.      Scheduled Meds:  (feeding supplement) PROSource Plus  30 mL Oral BID BM   enoxaparin  (LOVENOX ) injection  40 mg Subcutaneous  Q24H   feeding supplement (GLUCERNA SHAKE)  237 mL Oral BID BM   fiber supplement (BANATROL TF)  60 mL Oral BID   insulin  aspart  0-15 Units Subcutaneous TID WC   insulin  aspart  0-5 Units Subcutaneous QHS   insulin  aspart  4 Units Subcutaneous TID WC   insulin  glargine-yfgn  30 Units Subcutaneous Daily   multivitamin with minerals  1 tablet Oral Daily   potassium chloride   40 mEq Oral Once   sodium bicarbonate   1,300 mg Oral TID   Continuous Infusions:  cefTRIAXone  (ROCEPHIN )  IV 2 g (01/08/24 0907)   lactated ringers  75 mL/hr at 01/07/24 1540    magnesium  sulfate bolus IVPB     metronidazole  500 mg (01/08/24 0049)     LOS: 7 days    Time spent: 35 Minutes    Zarriah Starkel A Zeniyah Peaster, MD [facility]   If 7PM-7AM, please contact night-coverage [email or web address]  01/08/2024, 11:00 AM

## 2024-01-08 NOTE — Plan of Care (Signed)

## 2024-01-08 NOTE — Progress Notes (Signed)
 Occupational Therapy Treatment Patient Details Name: Riley Clarke MRN: [id number] DOB: 03-Dec-1955 Today's Date: 01/08/2024   History of present illness 68 yo male presents to ED 7/22 via EMS with N/V/D for approxmiately the 7 days and abdominal pain week following Mounjaro  injection. Pt found to have metabolic acidosis, lactic acidosis, DKA, shock, and acute renal failure. Placed on CRRT. 7/25 removed from CRRT 7/25 PMH: diabetes 2, HLD, HTN, Chronic Back pain, and Arthritis .   OT comments  Pt progressing toward goals, able to perform simulated tub and toilet transfer with CGA, pt decline use of RW during session for short distance ambulation, still with mild decr safety awareness. Pt denies dizziness. Pt presenting with impairments listed below, will follow acutely. Continue to recommend HHOT at d/c.       If plan is discharge home, recommend the following:  A little help with walking and/or transfers;A little help with bathing/dressing/bathroom;Assistance with cooking/housework;Assist for transportation;Help with stairs or ramp for entrance;Supervision due to cognitive status   Equipment Recommendations  Tub/shower seat;Other (comment) (RW)    Recommendations for Other Services PT consult    Precautions / Restrictions Precautions Precautions: Fall Precaution/Restrictions Comments: fall prior to admission, watch BP Restrictions Weight Bearing Restrictions Per Provider Order: No       Mobility Bed Mobility Overal bed mobility: Modified Independent                  Transfers Overall transfer level: Needs assistance Equipment used: None Transfers: Sit to/from Stand Sit to Stand: Contact guard assist                 Balance Overall balance assessment: Needs assistance Sitting-balance support: Feet supported, Bilateral upper extremity supported, Single extremity supported Sitting balance-Leahy Scale: Good     Standing balance support: No upper extremity  supported Standing balance-Leahy Scale: Fair                             ADL either performed or assessed with clinical judgement   ADL Overall ADL's : Needs assistance/impaired                         Toilet Transfer: Contact guard assist;Ambulation       Tub/ Shower Transfer: Contact guard assist;Ambulation          Extremity/Trunk Assessment Upper Extremity Assessment Upper Extremity Assessment: Generalized weakness   Lower Extremity Assessment Lower Extremity Assessment: Defer to PT evaluation        Vision       Perception Perception Perception: Not tested   Praxis Praxis Praxis: Not tested   Communication Communication Communication: No apparent difficulties   Cognition Arousal: Alert Behavior During Therapy: WFL for tasks assessed/performed Cognition: No family/caregiver present to determine baseline             OT - Cognition Comments: still mild decr insight to deficits/safety                 Following commands: Intact        Cueing   Cueing Techniques: Verbal cues, Gestural cues, Tactile cues, Visual cues  Exercises      Shoulder Instructions       General Comments no dizziness noted this session    Pertinent Vitals/ Pain       Pain Assessment Pain Assessment: No/denies pain  Home Living  Prior Functioning/Environment              Frequency  Min 2X/week        Progress Toward Goals  OT Goals(current goals can now be found in the care plan section)     Acute Rehab OT Goals Patient Stated Goal: none stated OT Goal Formulation: With patient Time For Goal Achievement: 01/20/24 Potential to Achieve Goals: Good ADL Goals Pt Will Perform Upper Body Dressing: Independently;sitting Pt Will Perform Lower Body Dressing: Independently;sitting/lateral leans;sit to/from stand Pt Will Transfer to Toilet: Independently;ambulating;regular  height toilet Pt Will Perform Tub/Shower Transfer: Tub transfer;Shower transfer;Independently;ambulating  Plan      Co-evaluation                 AM-PAC OT 6 Clicks Daily Activity     Outcome Measure   Help from another person eating meals?: A Little Help from another person taking care of personal grooming?: A Little Help from another person toileting, which includes using toliet, bedpan, or urinal?: A Little Help from another person bathing (including washing, rinsing, drying)?: A Little Help from another person to put on and taking off regular upper body clothing?: A Little Help from another person to put on and taking off regular lower body clothing?: A Little 6 Click Score: 18    End of Session Equipment Utilized During Treatment: Gait belt  OT Visit Diagnosis: Unsteadiness on feet (R26.81);Other abnormalities of gait and mobility (R26.89);Muscle weakness (generalized) (M62.81);History of falling (Z91.81)   Activity Tolerance Patient tolerated treatment well   Patient Left in bed;with call bell/phone within reach;with bed alarm set   Nurse Communication Mobility status        Time: 1428-1440 OT Time Calculation (min): 12 min  Charges: OT General Charges $OT Visit: 1 Visit OT Treatments $Self Care/Home Management : 8-22 mins  Jeziel Hoffmann K, OTD, OTR/L SecureChat Preferred Acute Rehab [phone]   Shulamis Wenberg K Koonce 01/08/2024, 3:00 PM

## 2024-01-09 DIAGNOSIS — E1010 Type 1 diabetes mellitus with ketoacidosis without coma: Secondary | ICD-10-CM | POA: Diagnosis not present

## 2024-01-09 LAB — CBC
HCT: 30.9 % — ABNORMAL LOW (ref 39.0–52.0)
Hemoglobin: 9.5 g/dL — ABNORMAL LOW (ref 13.0–17.0)
MCH: 24.1 pg — ABNORMAL LOW (ref 26.0–34.0)
MCHC: 30.7 g/dL (ref 30.0–36.0)
MCV: 78.4 fL — ABNORMAL LOW (ref 80.0–100.0)
Platelets: 281 K/uL (ref 150–400)
RBC: 3.94 MIL/uL — ABNORMAL LOW (ref 4.22–5.81)
RDW: 17.9 % — ABNORMAL HIGH (ref 11.5–15.5)
WBC: 12.1 K/uL — ABNORMAL HIGH (ref 4.0–10.5)
nRBC: 0 % (ref 0.0–0.2)

## 2024-01-09 LAB — RENAL FUNCTION PANEL
Albumin: 2.1 g/dL — ABNORMAL LOW (ref 3.5–5.0)
Anion gap: 9 (ref 5–15)
BUN: 8 mg/dL (ref 8–23)
CO2: 17 mmol/L — ABNORMAL LOW (ref 22–32)
Calcium: 8.1 mg/dL — ABNORMAL LOW (ref 8.9–10.3)
Chloride: 109 mmol/L (ref 98–111)
Creatinine, Ser: 0.62 mg/dL (ref 0.61–1.24)
GFR, Estimated: >60 mL/min (ref >60)
Glucose, Bld: 143 mg/dL — ABNORMAL HIGH (ref 70–99)
Phosphorus: 2.5 mg/dL (ref 2.5–4.6)
Potassium: 3.8 mmol/L (ref 3.5–5.1)
Sodium: 135 mmol/L (ref 135–145)

## 2024-01-09 LAB — LACTIC ACID, PLASMA: Lactic Acid, Venous: 1.5 mmol/L (ref 0.5–1.9)

## 2024-01-09 LAB — GLUCOSE, CAPILLARY
Glucose-Capillary: 137 mg/dL — ABNORMAL HIGH (ref 70–99)
Glucose-Capillary: 202 mg/dL — ABNORMAL HIGH (ref 70–99)
Glucose-Capillary: 261 mg/dL — ABNORMAL HIGH (ref 70–99)
Glucose-Capillary: 179 mg/dL — ABNORMAL HIGH (ref 70–99)

## 2024-01-09 LAB — MAGNESIUM: Magnesium: 1.5 mg/dL — ABNORMAL LOW (ref 1.7–2.4)

## 2024-01-09 MED ORDER — IPRATROPIUM-ALBUTEROL 0.5-2.5 (3) MG/3ML IN SOLN: 3.0000 mL | RESPIRATORY_TRACT | Status: DC | PRN

## 2024-01-09 MED ORDER — POTASSIUM PHOSPHATES 15 MMOLE/5ML IV SOLN
15.0000 mmol | Freq: Once | INTRAVENOUS | Status: AC
  Administered 2024-01-09: 15 mmol via INTRAVENOUS
  Filled 2024-01-09: qty 5

## 2024-01-09 MED ORDER — SODIUM CHLORIDE 0.9 % IV SOLN: INTRAVENOUS | Status: AC

## 2024-01-09 MED ORDER — MAGNESIUM SULFATE 4 GM/100ML IV SOLN
4.0000 g | Freq: Once | INTRAVENOUS | Status: AC
  Administered 2024-01-09: 4 g via INTRAVENOUS
  Filled 2024-01-09: qty 100

## 2024-01-09 MED ORDER — HYDRALAZINE HCL 20 MG/ML IJ SOLN: 10.0000 mg | INTRAMUSCULAR | Status: DC | PRN

## 2024-01-09 MED ORDER — SODIUM BICARBONATE 8.4 % IV SOLN
50.0000 meq | Freq: Two times a day (BID) | INTRAVENOUS | Status: AC
  Administered 2024-01-09 (×2): 50 meq via INTRAVENOUS
  Filled 2024-01-09 (×2): qty 50

## 2024-01-09 MED ORDER — POTASSIUM CHLORIDE 10 MEQ/100ML IV SOLN
10.0000 meq | INTRAVENOUS | Status: AC
  Administered 2024-01-09 (×2): 10 meq via INTRAVENOUS
  Filled 2024-01-09 (×2): qty 100

## 2024-01-09 NOTE — Progress Notes (Signed)
 Mobility Specialist Progress Note:   01/09/24 1106  Mobility  Activity Ambulated with assistance (In hallway)  Level of Assistance Standby assist, set-up cues, supervision of patient - no hands on  Assistive Device None  Distance Ambulated (ft) 150 ft  Activity Response Tolerated well  Mobility Referral Yes  Mobility visit 1 Mobility  Mobility Specialist Start Time (ACUTE ONLY) 0959  Mobility Specialist Stop Time (ACUTE ONLY) 1010  Mobility Specialist Time Calculation (min) (ACUTE ONLY) 11 min   Received pt in bed and agreeable to mobility. No physical assistance needed. No c/o. Returned to room and left pt EOB. Personal belongings and call light within reach. All needs met.  Riley Clarke Mobility Specialist  Please contact via [employer] or  Rehab Office [phone]

## 2024-01-09 NOTE — Progress Notes (Signed)
 Physical Therapy Treatment Patient Details Name: Riley Clarke MRN: [id number] DOB: 14-Aug-1955 Today's Date: 01/09/2024   History of Present Illness 68 yo male presents to ED 7/22 via EMS with N/V/D for approxmiately the 7 days and abdominal pain week following Mounjaro  injection. Pt found to have metabolic acidosis, lactic acidosis, DKA, shock, and acute renal failure. Placed on CRRT. 7/25 removed from CRRT 7/25 PMH: diabetes 2, HLD, HTN, Chronic Back pain, and Arthritis .    PT Comments  Pt up in recliner on arrival, pleasant and agreeable to session and demonstrating continued progress towards acute goals. Pt progressing gait without AD support with grossly CGA for safety with no overt LOB, however noted increased postural sway with no UE support. PT declining use of RW despite education on benefits. Pt continues to be limited in safe mobility by decreased activity tolerance, poor balance/postural reactions and LE pain. Pt continues to benefit from skilled PT services to progress toward functional mobility goals.     If plan is discharge home, recommend the following: A little help with walking and/or transfers;Help with stairs or ramp for entrance;A little help with bathing/dressing/bathroom   Can travel by private vehicle        Equipment Recommendations  BSC/3in1    Recommendations for Other Services       Precautions / Restrictions Precautions Precautions: Fall Precaution/Restrictions Comments: fall prior to admission, watch BP Restrictions Weight Bearing Restrictions Per Provider Order: No     Mobility  Bed Mobility Overal bed mobility: Modified Independent             General bed mobility comments: pt up in chair on arrival and at end of session    Transfers Overall transfer level: Needs assistance Equipment used: None Transfers: Sit to/from Stand Sit to Stand: Contact guard assist           General transfer comment: good hand placement and steady rise,  good eccentric control when coming to sit    Ambulation/Gait Ambulation/Gait assistance: Contact guard assist Gait Distance (Feet): 215 Feet Assistive device: None, IV Pole Gait Pattern/deviations: Step-through pattern, Decreased stride length, Wide base of support, Trunk flexed Gait velocity: decr     General Gait Details: pt without LOB, wide BOS with increased postural sway without AD support, increased stability with IV pole support, pt declining RW use   Stairs             Wheelchair Mobility     Tilt Bed    Modified Rankin (Stroke Patients Only)       Balance Overall balance assessment: Needs assistance Sitting-balance support: Feet supported, Bilateral upper extremity supported, Single extremity supported Sitting balance-Leahy Scale: Good     Standing balance support: No upper extremity supported Standing balance-Leahy Scale: Fair                              Hotel manager: No apparent difficulties  Cognition Arousal: Alert Behavior During Therapy: WFL for tasks assessed/performed   PT - Cognitive impairments: No apparent impairments                         Following commands: Intact      Cueing Cueing Techniques: Verbal cues, Gestural cues, Tactile cues, Visual cues  Exercises      General Comments        Pertinent Vitals/Pain Pain Assessment Pain Assessment: Faces Faces Pain Scale: Hurts  a little bit Pain Location: feet Pain Descriptors / Indicators: Sore, Tender Pain Intervention(s): Limited activity within patient's tolerance, Monitored during session    Home Living                          Prior Function            PT Goals (current goals can now be found in the care plan section) Acute Rehab PT Goals Patient Stated Goal: go home PT Goal Formulation: With patient Time For Goal Achievement: 01/19/24 Progress towards PT goals: Progressing toward goals     Frequency    Min 3X/week      PT Plan      Co-evaluation              AM-PAC PT 6 Clicks Mobility   Outcome Measure  Help needed turning from your back to your side while in a flat bed without using bedrails?: None Help needed moving from lying on your back to sitting on the side of a flat bed without using bedrails?: None Help needed moving to and from a bed to a chair (including a wheelchair)?: None Help needed standing up from a chair using your arms (e.g., wheelchair or bedside chair)?: A Little Help needed to walk in hospital room?: A Little Help needed climbing 3-5 steps with a railing? : A Little 6 Click Score: 21    End of Session   Activity Tolerance: Patient tolerated treatment well Patient left: with call bell/phone within reach;in chair Nurse Communication: Mobility status PT Visit Diagnosis: Other abnormalities of gait and mobility (R26.89);Muscle weakness (generalized) (M62.81);History of falling (Z91.81) Pain - part of body:  (back)     Time: 8474-8464 PT Time Calculation (min) (ACUTE ONLY): 10 min  Charges:    $Gait Training: 8-22 mins PT General Charges $$ ACUTE PT VISIT: 1 Visit                     Dinia Joynt R. PTA Acute Rehabilitation Services Office: [phone]   Therisa CHRISTELLA Boor 01/09/2024, 4:17 PM

## 2024-01-09 NOTE — Progress Notes (Signed)
 PROGRESS NOTE    Riley Clarke  FMW:[id number] DOB: March 04, 1956 DOA: 01/01/2024 PCP: [name], NP    Brief Narrative:  68 year old with history of DM2, HTN, HLD, chronic low back pain, osteoarthritis brought to the hospital by EMS for ongoing 7 days of nausea, vomiting and diarrhea with abdominal pain.  Patient was started on Mounjaro  on 7/14 and has been taking metformin , lisinopril , HCTZ and Jardiance .  Upon admission pH noted to be 6.9, lactate 10, creatinine 12.  Admitted for DKA, acute renal failure.  Initially started on CRRT on 7/22 and eventually stopped on 7/25.  GI panel grew Salmonella, EPEC, EPEC therefore started on Rocephin .  Still having persistent diarrhea, requiring oral bicarb due to metabolic derangements.   Assessment & Plan:  Principal Problem:   DKA (diabetic ketoacidosis) (HCC) Active Problems:   AKI (acute kidney injury) (HCC)   Sepsis (HCC)   Hyponatremia    DKA, diabetes type 2: Metabolic acidosis: Initially on Endo tool due to severe DKA, now has been transitioned to subcu insulin , Premeal and sliding scale.  Further continue to adjust as necessary.  Continue to monitor anion gap.   Lactic acidosis, septic shock, metabolic acidosis -Multifactorial in nature.  This is now improved.  DC PO NaBicarb, Will give 2 doses IV Na Bicarb (due to GI issues). Repeat Lactic Acid is normal -Discontinue lactated ringer , start NS   Gastroenteritis,Salmonella, ETEC, EPEC -GI panel positive.  Currently on IV Rocephin  started 7/23, Flagyl  started 7/28.  Continue until diarrhea has improved.    Hypokalemia/hypomagnesemia/hypophosphatemia -As needed repletion   A-fib with RVR  -Resolved     Acute renal failure -Admission creatinine 11.2.  Briefly required CRRT from 7/22 - 7/25.  Now creatinine back to baseline 0.6   Chronic back pain -pain management.       Interventions: Refer to RD note for recommendations   Estimated body mass index is 28.4 kg/m as  calculated from the following:   Height as of this encounter: 6' (1.829 m).   Weight as of this encounter: 95 kg.     DVT prophylaxis: Lovenox  Code Status: Full code Family Communication: Care discussed with patient.  Disposition Plan:  Status is: Inpatient Remains inpatient appropriate because: management of shock, renal failure. Stable for rehab.  Hopefully discharge tomorrow     Consultants:  Nephrology CCM    Subjective: Seen at bedside overall feeling better Sometimes does report of weakness    Examination:  General exam: Appears calm and comfortable  Respiratory system: Clear to auscultation. Respiratory effort normal. Cardiovascular system: S1 & S2 heard, RRR. No JVD, murmurs, rubs, gallops or clicks. No pedal edema. Gastrointestinal system: Abdomen is nondistended, soft and nontender. No organomegaly or masses felt. Normal bowel sounds heard. Central nervous system: Alert and oriented. No focal neurological deficits. Extremities: Symmetric 5 x 5 power. Skin: No rashes, lesions or ulcers Psychiatry: Judgement and insight appear normal. Mood & affect appropriate.                Diet Orders (From admission, onward)     Start     Ordered   01/03/24 2300  Diet Carb Modified Fluid consistency: Thin; Room service appropriate? Yes  Diet effective now       Question Answer Comment  Diet-HS Snack? Nothing   Calorie Level Medium 1600-2000   Fluid consistency: Thin   Room service appropriate? Yes      01/03/24 2300  Objective: Vitals:   01/08/24 1700 01/08/24 1959 01/09/24 0448 01/09/24 0700  BP: (!) 141/77 131/72 107/65 122/60  Pulse: 80 95 87 88  Resp: 18 18 18 16   Temp: 98.1 F (36.7 C) 99.6 F (37.6 C) 98.9 F (37.2 C) 98.3 F (36.8 C)  TempSrc: Oral Oral Oral Oral  SpO2: 98% 100% 99% 99%  Weight:      Height:        Intake/Output Summary (Last 24 hours) at 01/09/2024 1146 Last data filed at 01/09/2024 0752 Gross per 24  hour  Intake 1155.71 ml  Output --  Net 1155.71 ml   Filed Weights   01/06/24 0416 01/07/24 0500 01/08/24 0405  Weight: 95.2 kg 95.8 kg 95 kg    Scheduled Meds:  (feeding supplement) PROSource Plus  30 mL Oral BID BM   enoxaparin  (LOVENOX ) injection  40 mg Subcutaneous Q24H   feeding supplement (GLUCERNA SHAKE)  237 mL Oral BID BM   fiber supplement (BANATROL TF)  60 mL Oral BID   insulin  aspart  0-15 Units Subcutaneous TID WC   insulin  aspart  0-5 Units Subcutaneous QHS   insulin  aspart  4 Units Subcutaneous TID WC   insulin  glargine-yfgn  30 Units Subcutaneous Daily   multivitamin with minerals  1 tablet Oral Daily   sodium bicarbonate   50 mEq Intravenous Q12H   Continuous Infusions:  sodium chloride  100 mL/hr at 01/09/24 0835   cefTRIAXone  (ROCEPHIN )  IV 2 g (01/09/24 0837)   magnesium  sulfate bolus IVPB 4 g (01/09/24 0948)   metronidazole  500 mg (01/09/24 0032)   potassium chloride  10 mEq (01/09/24 1138)   potassium PHOSPHATE  IVPB (in mmol)      Nutritional status Signs/Symptoms: per patient/family report Interventions: Refer to RD note for recommendations Body mass index is 28.4 kg/m.  Data Reviewed:   CBC: Recent Labs  Lab 01/05/24 0548 01/06/24 0719 01/07/24 0732 01/08/24 0633 01/09/24 0606  WBC 12.3* 16.0* 16.7* 14.6* 12.1*  HGB 9.7* 10.3* 9.8* 10.4* 9.5*  HCT 29.7* 32.1* 30.9* 33.1* 30.9*  MCV 75.0* 74.8* 76.7* 79.4* 78.4*  PLT 130* 139* 136* 209 281   Basic Metabolic Panel: Recent Labs  Lab 01/05/24 0548 01/05/24 1629 01/06/24 0719 01/07/24 0732 01/07/24 1922 01/08/24 0633 01/09/24 0606  NA 134* 135 140 137 136 136 135  K 3.3* 3.2* 3.2* 2.8* 3.4* 3.6 3.8  CL 91* 97* 102 99 107 106 109  CO2 28 26 23  20* 19* 16* 17*  GLUCOSE 276* 288* 149* 222* 201* 130* 143*  BUN 19 23 17 12 12 12 8   CREATININE 1.00 0.87 0.85 0.74 0.62 0.61 0.62  CALCIUM  8.0* 8.3* 8.4* 8.5* 8.0* 8.4* 8.1*  MG 2.2  --  1.7 1.5*  --  1.7 1.5*  PHOS 2.5 1.3* 2.5 3.6  --   2.9 2.5   GFR: Estimated Creatinine Clearance: 108.7 mL/min (by C-G formula based on SCr of 0.62 mg/dL). Liver Function Tests: Recent Labs  Lab 01/05/24 1629 01/06/24 0719 01/07/24 0732 01/08/24 9366 01/09/24 0606  ALBUMIN 2.2* 2.4* 2.3* 2.4* 2.1*   No results for input(s): LIPASE, AMYLASE in the last 168 hours. No results for input(s): AMMONIA in the last 168 hours. Coagulation Profile: No results for input(s): INR, PROTIME in the last 168 hours. Cardiac Enzymes: No results for input(s): CKTOTAL, CKMB, CKMBINDEX, TROPONINI in the last 168 hours. BNP (last 3 results) No results for input(s): PROBNP in the last 8760 hours. HbA1C: No results for input(s): HGBA1C in the last  72 hours. CBG: Recent Labs  Lab 01/08/24 1737 01/08/24 2110 01/08/24 2207 01/09/24 0747 01/09/24 1132  GLUCAP 195* 179* 192* 137* 202*   Lipid Profile: No results for input(s): CHOL, HDL, LDLCALC, TRIG, CHOLHDL, LDLDIRECT in the last 72 hours. Thyroid  Function Tests: No results for input(s): TSH, T4TOTAL, FREET4, T3FREE, THYROIDAB in the last 72 hours. Anemia Panel: No results for input(s): VITAMINB12, FOLATE, FERRITIN, TIBC, IRON, RETICCTPCT in the last 72 hours. Sepsis Labs: Recent Labs  Lab 01/09/24 0908  LATICACIDVEN 1.5    Recent Results (from the past 240 hours)  MRSA Next Gen by PCR, Nasal     Status: None   Collection Time: 01/01/24  4:06 PM   Specimen: Nasal Mucosa; Nasal Swab  Result Value Ref Range Status   MRSA by PCR Next Gen NOT DETECTED NOT DETECTED Final    Comment: (NOTE) The GeneXpert MRSA Assay (FDA approved for NASAL specimens only), is one component of a comprehensive MRSA colonization surveillance program. It is not intended to diagnose MRSA infection nor to guide or monitor treatment for MRSA infections. Test performance is not FDA approved in patients less than 24 years old. Performed at [facility],  [address] [place], [place] [address]   Resp panel by RT-PCR (RSV, Flu A&B, Covid) Urine, Clean Catch     Status: None   Collection Time: 01/01/24  4:07 PM   Specimen: Urine, Clean Catch; Nasal Swab  Result Value Ref Range Status   SARS Coronavirus 2 by RT PCR NEGATIVE NEGATIVE Final   Influenza A by PCR NEGATIVE NEGATIVE Final   Influenza B by PCR NEGATIVE NEGATIVE Final    Comment: (NOTE) The Xpert Xpress SARS-CoV-2/FLU/RSV plus assay is intended as an aid in the diagnosis of influenza from Nasopharyngeal swab specimens and should not be used as a sole basis for treatment. Nasal washings and aspirates are unacceptable for Xpert Xpress SARS-CoV-2/FLU/RSV testing.  Fact Sheet for Patients: BloggerCourse.com  Fact Sheet for Healthcare Providers: SeriousBroker.it  This test is not yet approved or cleared by the [place]  FDA and has been authorized for detection and/or diagnosis of SARS-CoV-2 by FDA under an Emergency Use Authorization (EUA). This EUA will remain in effect (meaning this test can be used) for the duration of the COVID-19 declaration under Section 564(b)(1) of the Act, 21 U.S.C. section 360bbb-3(b)(1), unless the authorization is terminated or revoked.     Resp Syncytial Virus by PCR NEGATIVE NEGATIVE Final    Comment: (NOTE) Fact Sheet for Patients: BloggerCourse.com  Fact Sheet for Healthcare Providers: SeriousBroker.it  This test is not yet approved or cleared by the [place]  FDA and has been authorized for detection and/or diagnosis of SARS-CoV-2 by FDA under an Emergency Use Authorization (EUA). This EUA will remain in effect (meaning this test can be used) for the duration of the COVID-19 declaration under Section 564(b)(1) of the Act, 21 U.S.C. section 360bbb-3(b)(1), unless the authorization is terminated or revoked.  Performed at [facility], [address], [place], [place] [address]   Gastrointestinal Panel by PCR , Stool     Status: Abnormal   Collection Time: 01/02/24  2:30 AM   Specimen: Stool  Result Value Ref Range Status   Campylobacter species NOT DETECTED NOT DETECTED Final   Plesimonas shigelloides NOT DETECTED NOT DETECTED Final   Salmonella species DETECTED (A) NOT DETECTED Final    Comment: RESULT CALLED TO, READ BACK BY AND VERIFIED WITH: YENI GRANADOS 1055 01/02/2024 DLB  Yersinia enterocolitica NOT DETECTED NOT DETECTED Final   Vibrio species NOT DETECTED NOT DETECTED Final   Vibrio cholerae NOT DETECTED NOT DETECTED Final   Enteroaggregative E coli (EAEC) NOT DETECTED NOT DETECTED Final   Enteropathogenic E coli (EPEC) DETECTED (A) NOT DETECTED Final    Comment: RESULT CALLED TO, READ BACK BY AND VERIFIED WITH: YENI GRANADOS 1055 01/02/2024 DLB    Enterotoxigenic E coli (ETEC) DETECTED (A) NOT DETECTED Final    Comment: RESULT CALLED TO, READ BACK BY AND VERIFIED WITH: YENI GRANADOS 1055 01/02/2024 DLB    Shiga like toxin producing E coli (STEC) NOT DETECTED NOT DETECTED Final   Shigella/Enteroinvasive E coli (EIEC) NOT DETECTED NOT DETECTED Final   Cryptosporidium NOT DETECTED NOT DETECTED Final   Cyclospora cayetanensis NOT DETECTED NOT DETECTED Final   Entamoeba histolytica NOT DETECTED NOT DETECTED Final   Giardia lamblia NOT DETECTED NOT DETECTED Final   Adenovirus F40/41 NOT DETECTED NOT DETECTED Final   Astrovirus NOT DETECTED NOT DETECTED Final   Norovirus GI/GII NOT DETECTED NOT DETECTED Final   Rotavirus A NOT DETECTED NOT DETECTED Final   Sapovirus (I, II, IV, and V) NOT DETECTED NOT DETECTED Final    Comment: Performed at [facility], [address] [place], [place] [address]  C Difficile Quick Screen w PCR reflex     Status: None   Collection Time: 01/02/24  2:30 AM  Result Value Ref Range Status   C Diff antigen NEGATIVE NEGATIVE Final   C Diff toxin NEGATIVE  NEGATIVE Final   C Diff interpretation No C. difficile detected.  Final    Comment: Performed at [facility], [address] [place], [place] [address]  Urine Culture     Status: None   Collection Time: 01/02/24  4:00 AM   Specimen: Urine, Random  Result Value Ref Range Status   Specimen Description URINE, RANDOM  Final   Special Requests NONE Reflexed from [id number]  Final   Culture   Final    NO GROWTH Performed at [facility], [address] [place], [place] [address]    Report Status 01/03/2024 FINAL  Final         Radiology Studies: No results found.         LOS: 8 days   Time spent= 35 mins    Burgess JAYSON Dare, MD [facility]  If 7PM-7AM, please contact night-coverage  01/09/2024, 11:46 AM

## 2024-01-09 NOTE — Hospital Course (Addendum)
 Brief Narrative:  68 year old with history of DM2, HTN, HLD, chronic low back pain, osteoarthritis brought to the hospital by EMS for ongoing 7 days of nausea, vomiting and diarrhea with abdominal pain.  Patient was started on Mounjaro  on 7/14 and has been taking metformin , lisinopril , HCTZ and Jardiance .  Upon admission pH noted to be 6.9, lactate 10, creatinine 12.  Admitted for DKA, acute renal failure.  Initially started on CRRT on 7/22 and eventually stopped on 7/25.  GI panel grew Salmonella, EPEC, EPEC therefore started on Rocephin .  Still having persistent diarrhea, requiring oral bicarb due to metabolic derangements.  His electrolytes were repleted during the hospitalization and slowly his diarrhea significantly improved and was tolerating p.o. without any issues.  Today patient wishes to go home as he feels significantly better.   Assessment & Plan:  Principal Problem:   DKA (diabetic ketoacidosis) (HCC) Active Problems:   AKI (acute kidney injury) (HCC)   Sepsis (HCC)   Hyponatremia    DKA, diabetes type 2: Metabolic acidosis: Initially on Endo tool due to severe DKA, now has been transitioned to subcu insulin , Premeal and sliding scale.  Further continue to adjust as necessary.  Continue to monitor anion gap.  He has been advised to remain compliant with his diabetic regimen at home.   Lactic acidosis, septic shock, metabolic acidosis - Improved with IV fluids and sodium bicarb during this hospitalization.  He will require repeat blood work done with his PCP in 1 week   Gastroenteritis,Salmonella, ETEC, EPEC -GI panel positive.  Diarrhea has significantly started improving on IV Rocephin  and also received brief course of Flagyl .  Today we will transition him to 1 more day of p.o. Cipro  which will complete total 10-day course   Hypokalemia/hypomagnesemia/hypophosphatemia -As needed repletion   A-fib with RVR  -Resolved     Acute renal failure -Admission creatinine 11.2.   Briefly required CRRT from 7/22 - 7/25.  Now creatinine back to baseline 0.6   Chronic back pain -pain management.       Interventions: Refer to RD note for recommendations   Estimated body mass index is 28.4 kg/m as calculated from the following:   Height as of this encounter: 6' (1.829 m).   Weight as of this encounter: 95 kg.     DVT prophylaxis: Lovenox  Code Status: Full code Family Communication: Care discussed with patient.  Disposition Plan:  Status is: Inpatient Remains inpatient appropriate because: Discharge today     Consultants:  Nephrology CCM    Subjective: Patient is really wishing to go home today.  Does not have any complaints.  Tolerating p.o. without any issues.  Examination:  General exam: Appears calm and comfortable  Respiratory system: Clear to auscultation. Respiratory effort normal. Cardiovascular system: S1 & S2 heard, RRR. No JVD, murmurs, rubs, gallops or clicks. No pedal edema. Gastrointestinal system: Abdomen is nondistended, soft and nontender. No organomegaly or masses felt. Normal bowel sounds heard. Central nervous system: Alert and oriented. No focal neurological deficits. Extremities: Symmetric 5 x 5 power. Skin: No rashes, lesions or ulcers Psychiatry: Judgement and insight appear normal. Mood & affect appropriate.

## 2024-01-10 ENCOUNTER — Other Ambulatory Visit (HOSPITAL_COMMUNITY): Payer: Self-pay

## 2024-01-10 DIAGNOSIS — E0810 Diabetes mellitus due to underlying condition with ketoacidosis without coma: Secondary | ICD-10-CM

## 2024-01-10 LAB — BASIC METABOLIC PANEL WITH GFR
Anion gap: 9 (ref 5–15)
BUN: 8 mg/dL (ref 8–23)
CO2: 19 mmol/L — ABNORMAL LOW (ref 22–32)
Calcium: 7.9 mg/dL — ABNORMAL LOW (ref 8.9–10.3)
Chloride: 110 mmol/L (ref 98–111)
Creatinine, Ser: 0.64 mg/dL (ref 0.61–1.24)
GFR, Estimated: >60 mL/min (ref >60)
Glucose, Bld: 141 mg/dL — ABNORMAL HIGH (ref 70–99)
Potassium: 4 mmol/L (ref 3.5–5.1)
Sodium: 138 mmol/L (ref 135–145)

## 2024-01-10 LAB — RENAL FUNCTION PANEL
Albumin: 2.2 g/dL — ABNORMAL LOW (ref 3.5–5.0)
Anion gap: 10 (ref 5–15)
BUN: 8 mg/dL (ref 8–23)
CO2: 19 mmol/L — ABNORMAL LOW (ref 22–32)
Calcium: 8 mg/dL — ABNORMAL LOW (ref 8.9–10.3)
Chloride: 110 mmol/L (ref 98–111)
Creatinine, Ser: 0.67 mg/dL (ref 0.61–1.24)
GFR, Estimated: >60 mL/min (ref >60)
Glucose, Bld: 138 mg/dL — ABNORMAL HIGH (ref 70–99)
Phosphorus: 3 mg/dL (ref 2.5–4.6)
Potassium: 4.1 mmol/L (ref 3.5–5.1)
Sodium: 139 mmol/L (ref 135–145)

## 2024-01-10 LAB — CBC
HCT: 28.6 % — ABNORMAL LOW (ref 39.0–52.0)
Hemoglobin: 8.9 g/dL — ABNORMAL LOW (ref 13.0–17.0)
MCH: 24.5 pg — ABNORMAL LOW (ref 26.0–34.0)
MCHC: 31.1 g/dL (ref 30.0–36.0)
MCV: 78.8 fL — ABNORMAL LOW (ref 80.0–100.0)
Platelets: 420 K/uL — ABNORMAL HIGH (ref 150–400)
RBC: 3.63 MIL/uL — ABNORMAL LOW (ref 4.22–5.81)
RDW: 17.8 % — ABNORMAL HIGH (ref 11.5–15.5)
WBC: 12.7 K/uL — ABNORMAL HIGH (ref 4.0–10.5)
nRBC: 0 % (ref 0.0–0.2)

## 2024-01-10 LAB — GLUCOSE, CAPILLARY
Glucose-Capillary: 251 mg/dL — ABNORMAL HIGH (ref 70–99)
Glucose-Capillary: 164 mg/dL — ABNORMAL HIGH (ref 70–99)

## 2024-01-10 LAB — PHOSPHORUS: Phosphorus: 3 mg/dL (ref 2.5–4.6)

## 2024-01-10 LAB — MAGNESIUM: Magnesium: 1.6 mg/dL — ABNORMAL LOW (ref 1.7–2.4)

## 2024-01-10 MED ORDER — INSULIN GLARGINE 100 UNIT/ML SOLOSTAR PEN
30.0000 [IU] | PEN_INJECTOR | Freq: Every day | SUBCUTANEOUS | 0 refills | Status: DC
  Filled 2024-01-10: qty 15, 50d supply, fill #0

## 2024-01-10 MED ORDER — BANATROL TF EN LIQD
60.0000 mL | Freq: Two times a day (BID) | ENTERAL | 0 refills | Status: AC
  Filled 2024-01-10: qty 1800, 15d supply, fill #0

## 2024-01-10 MED ORDER — INSULIN PEN NEEDLE 32G X 4 MM MISC
1.0000 | Freq: Three times a day (TID) | 0 refills | Status: DC
  Filled 2024-01-10: qty 100, 34d supply, fill #0

## 2024-01-10 MED ORDER — MAGNESIUM SULFATE 2 GM/50ML IV SOLN
2.0000 g | Freq: Once | INTRAVENOUS | Status: AC
  Administered 2024-01-10: 2 g via INTRAVENOUS
  Filled 2024-01-10: qty 50

## 2024-01-10 MED ORDER — FREESTYLE LIBRE 3 READER DEVI
1.0000 | Freq: Four times a day (QID) | 1 refills | Status: AC
  Filled 2024-01-10: qty 1, 30d supply, fill #0

## 2024-01-10 MED ORDER — MAGNESIUM OXIDE -MG SUPPLEMENT 400 (240 MG) MG PO TABS
800.0000 mg | ORAL_TABLET | Freq: Once | ORAL | Status: AC
  Administered 2024-01-10: 800 mg via ORAL
  Filled 2024-01-10: qty 2

## 2024-01-10 MED ORDER — ACCU-CHEK SOFTCLIX LANCETS MISC
1.0000 | Freq: Three times a day (TID) | 0 refills | Status: AC
  Filled 2024-01-10: qty 100, 34d supply, fill #0

## 2024-01-10 MED ORDER — FREESTYLE LIBRE 3 PLUS SENSOR MISC
1 refills | Status: DC
  Filled 2024-01-10 – 2024-02-18 (×2): qty 2, 28d supply, fill #0

## 2024-01-10 MED ORDER — BLOOD GLUCOSE MONITOR SYSTEM W/DEVICE KIT
1.0000 | PACK | Freq: Three times a day (TID) | 0 refills | Status: AC
  Filled 2024-01-10: qty 1, 30d supply, fill #0

## 2024-01-10 MED ORDER — CIPROFLOXACIN HCL 500 MG PO TABS
500.0000 mg | ORAL_TABLET | Freq: Two times a day (BID) | ORAL | 0 refills | Status: AC
  Filled 2024-01-10: qty 2, 1d supply, fill #0

## 2024-01-10 MED ORDER — BLOOD GLUCOSE TEST VI STRP
1.0000 | ORAL_STRIP | Freq: Three times a day (TID) | 0 refills | Status: DC
  Filled 2024-01-10: qty 100, 34d supply, fill #0

## 2024-01-10 MED ORDER — LANCET DEVICE MISC
1.0000 | Freq: Three times a day (TID) | 0 refills | Status: DC
  Filled 2024-01-10: qty 1, 30d supply, fill #0

## 2024-01-10 MED ORDER — INSULIN LISPRO (1 UNIT DIAL) 100 UNIT/ML (KWIKPEN)
2.0000 [IU] | PEN_INJECTOR | Freq: Three times a day (TID) | SUBCUTANEOUS | 0 refills | Status: DC
Start: 2024-01-10 — End: 2024-02-26
  Filled 2024-01-10: qty 15, 62d supply, fill #0

## 2024-01-10 NOTE — Discharge Summary (Addendum)
 Physician Discharge Summary  Riley Clarke FMW:[id number] DOB: 24-Mar-1956 DOA: 01/01/2024  PCP: [name], NP  Admit date: 01/01/2024 Discharge date: 01/10/2024  Admitted From: Home Disposition: Home  Recommendations for Outpatient Follow-up:  Follow up with PCP in 1-2 weeks Please obtain BMP/CBC in one week your next doctors visit.  1 day of p.o. Cipro  Diabetic regimen adjusted.  Now will be on   Discharge Condition: Stable CODE STATUS: Full code Diet recommendation: GI soft diet, slowly advance as tolerated Home  Brief/Interim Summary: Brief Narrative:  68 year old with history of DM2, HTN, HLD, chronic low back pain, osteoarthritis brought to the hospital by EMS for ongoing 7 days of nausea, vomiting and diarrhea with abdominal pain.  Patient was started on Mounjaro  on 7/14 and has been taking metformin , lisinopril , HCTZ and Jardiance .  Upon admission pH noted to be 6.9, lactate 10, creatinine 12.  Admitted for DKA, acute renal failure.  Initially started on CRRT on 7/22 and eventually stopped on 7/25.  GI panel grew Salmonella, EPEC, EPEC therefore started on Rocephin .  Still having persistent diarrhea, requiring oral bicarb due to metabolic derangements.  His electrolytes were repleted during the hospitalization and slowly his diarrhea significantly improved and was tolerating p.o. without any issues.  Today patient wishes to go home as he feels significantly better.   Assessment & Plan:  Principal Problem:   DKA (diabetic ketoacidosis) (HCC) Active Problems:   AKI (acute kidney injury) (HCC)   Sepsis (HCC)   Hyponatremia    DKA, diabetes type 2: Metabolic acidosis: Initially on Endo tool due to severe DKA, now has been transitioned to subcu insulin , Premeal and sliding scale.  Further continue to adjust as necessary.  Continue to monitor anion gap.  He has been advised to remain compliant with his diabetic regimen at home.   Lactic acidosis, septic shock,  metabolic acidosis - Improved with IV fluids and sodium bicarb during this hospitalization.  He will require repeat blood work done with his PCP in 1 week   Gastroenteritis,Salmonella, ETEC, EPEC -GI panel positive.  Diarrhea has significantly started improving on IV Rocephin  and also received brief course of Flagyl .  Today we will transition him to 1 more day of p.o. Cipro  which will complete total 10-day course   Hypokalemia/hypomagnesemia/hypophosphatemia -As needed repletion   A-fib with RVR  -Resolved     Acute renal failure -Admission creatinine 11.2.  Briefly required CRRT from 7/22 - 7/25.  Now creatinine back to baseline 0.6   Chronic back pain -pain management.       Interventions: Refer to RD note for recommendations   Estimated body mass index is 28.4 kg/m as calculated from the following:   Height as of this encounter: 6' (1.829 m).   Weight as of this encounter: 95 kg.     DVT prophylaxis: Lovenox  Code Status: Full code Family Communication: Care discussed with patient.  Disposition Plan:  Status is: Inpatient Remains inpatient appropriate because: Discharge today     Consultants:  Nephrology CCM    Subjective: Patient is really wishing to go home today.  Does not have any complaints.  Tolerating p.o. without any issues.  Examination:  General exam: Appears calm and comfortable  Respiratory system: Clear to auscultation. Respiratory effort normal. Cardiovascular system: S1 & S2 heard, RRR. No JVD, murmurs, rubs, gallops or clicks. No pedal edema. Gastrointestinal system: Abdomen is nondistended, soft and nontender. No organomegaly or masses felt. Normal bowel sounds heard. Central nervous system: Alert and oriented.  No focal neurological deficits. Extremities: Symmetric 5 x 5 power. Skin: No rashes, lesions or ulcers Psychiatry: Judgement and insight appear normal. Mood & affect appropriate.    Discharge Diagnoses:  Principal Problem:   DKA  (diabetic ketoacidosis) (HCC) Active Problems:   AKI (acute kidney injury) (HCC)   Sepsis (HCC)   Hyponatremia      Discharge Exam: Vitals:   01/10/24 0451 01/10/24 0845  BP: (!) 115/57 119/66  Pulse: 80 94  Resp: 18 18  Temp: 98.1 F (36.7 C) 98.7 F (37.1 C)  SpO2: 100% 100%   Vitals:   01/09/24 1959 01/10/24 0451 01/10/24 0500 01/10/24 0845  BP: (!) 112/58 (!) 115/57  119/66  Pulse: 84 80  94  Resp: 18 18  18   Temp: 98.4 F (36.9 C) 98.1 F (36.7 C)  98.7 F (37.1 C)  TempSrc: Oral Oral  Oral  SpO2: 100% 100%  100%  Weight:   95 kg   Height:          Discharge Instructions   Allergies as of 01/10/2024       Reactions   Cymbalta [duloxetine Hcl] Nausea And Vomiting, Other (See Comments)   Dizziness Headaches    Shrimp [shellfish Allergy] Nausea And Vomiting   Valium  [diazepam ] Itching        Medication List     STOP taking these medications    empagliflozin  25 MG Tabs tablet Commonly known as: Jardiance    metFORMIN  1000 MG tablet Commonly known as: GLUCOPHAGE    Mounjaro  5 MG/0.5ML Pen Generic drug: tirzepatide        TAKE these medications    Accu-Chek Guide Test test strip Generic drug: glucose blood Use 3 (three) times daily as directed to check blood sugar. .   Accu-Chek Guide w/Device Kit Use 3 (three) times daily.   Accu-Chek Softclix Lancets lancets Use 3 (three) times daily as directed to check blood sugar   ciprofloxacin  500 MG tablet Commonly known as: Cipro  Take 1 tablet (500 mg total) by mouth 2 (two) times daily for 1 day. Start taking on: January 11, 2024   cyclobenzaprine  10 MG tablet Commonly known as: FLEXERIL  TAKE 1 TABLET(10 MG) BY MOUTH THREE TIMES DAILY AS NEEDED FOR MUSCLE SPASMS   esomeprazole  40 MG capsule Commonly known as: NEXIUM  TAKE 1 CAPSULE BY MOUTH EVERY MORNING BEFORE BREAKFAST What changed:  how much to take how to take this when to take this additional instructions   fiber supplement  (BANATROL TF) liquid Take 60 mLs by mouth 2 (two) times daily for 15 days.   FreeStyle Libre 3 Reader [place] Use 4 (four) times daily.   FreeStyle Libre 3 Sensor Misc Change sensor every 14 days.   gabapentin  300 MG capsule Commonly known as: NEURONTIN  Take 1 capsule (300 mg total) by mouth 3 (three) times daily.   HumaLOG  KwikPen 100 UNIT/ML KwikPen Generic drug: insulin  lispro Inject 2 Units into the skin 3 (three) times daily with meals. If eating and Blood Glucose (BG) 80 or higher inject 2 units for meal coverage and add correction dose per scale. If not eating, correction dose only. BG <150= 0 unit; BG 150-200= 1 unit; BG 201-250= 2 unit; BG 251-300= 3 unit; BG 301-350= 4 unit; BG 351-400= 5 unit; BG >400= 6 unit and Call Primary Care.   Lancet Device Misc 1 each by Does not apply route 3 (three) times daily. May dispense any manufacturer covered by patient's insurance.   Lantus  SoloStar 100 UNIT/ML Solostar Pen  Generic drug: insulin  glargine Inject 30 Units into the skin daily.   lisinopril -hydrochlorothiazide  20-25 MG tablet Commonly known as: ZESTORETIC  TAKE 1 TABLET BY MOUTH DAILY   oxyCODONE -acetaminophen  10-325 MG tablet Commonly known as: PERCOCET Take 1 tablet by mouth 4 (four) times daily as needed for pain.   simvastatin  10 MG tablet Commonly known as: ZOCOR  TAKE 1 TABLET(10 MG) BY MOUTH DAILY   TechLite Pen Needles 32G X 4 MM Misc Generic drug: Insulin  Pen Needle Use 3 (three) times daily.               Durable Medical Equipment  (From admission, onward)           Start     Ordered   01/08/24 0825  For home use only DME Walker rolling  Once       Question Answer Comment  Walker: With 5 Inch Wheels   Patient needs a walker to treat with the following condition Weakness      01/08/24 0825   01/08/24 0825  For home use only DME Bedside commode  Once       Question:  Patient needs a bedside commode to treat with the following condition   Answer:  Weakness   01/08/24 0825            Follow-up Information     [name], NP Follow up in 1 week(s).   Specialties: Pulmonary Disease, Endocrinology Contact information: [address] [place] [address] [phone]         [facility] Follow up.   Specialty: Home Health Services Contact information: [address] [place] [address] [place] [address] [phone]                Allergies  Allergen Reactions   Cymbalta [Duloxetine Hcl] Nausea And Vomiting and Other (See Comments)    Dizziness Headaches    Shrimp [Shellfish Allergy] Nausea And Vomiting   Valium  [Diazepam ] Itching    You were cared for by a hospitalist during your hospital stay. If you have any questions about your discharge medications or the care you received while you were in the hospital after you are discharged, you can call the unit and asked to speak with the hospitalist on call if the hospitalist that took care of you is not available. Once you are discharged, your primary care physician will handle any further medical issues. Please note that no refills for any discharge medications will be authorized once you are discharged, as it is imperative that you return to your primary care physician (or establish a relationship with a primary care physician if you do not have one) for your aftercare needs so that they can reassess your need for medications and monitor your lab values.  You were cared for by a hospitalist during your hospital stay. If you have any questions about your discharge medications or the care you received while you were in the hospital after you are discharged, you can call the unit and asked to speak with the hospitalist on call if the hospitalist that took care of you is not available. Once you are discharged, your primary care physician will handle any further medical issues. Please note that NO REFILLS for any discharge medications will be  authorized once you are discharged, as it is imperative that you return to your primary care physician (or establish a relationship with a primary care physician if you do not have one) for your aftercare  needs so that they can reassess your need for medications and monitor your lab values.  Please request your Prim.MD to go over all Hospital Tests and Procedure/Radiological results at the follow up, please get all Hospital records sent to your Prim MD by signing hospital release before you go home.  Get CBC, CMP, 2 view Chest X ray checked  by Primary MD during your next visit or SNF MD in 5-7 days ( we routinely change or add medications that can affect your baseline labs and fluid status, therefore we recommend that you get the mentioned basic workup next visit with your PCP, your PCP may decide not to get them or add new tests based on their clinical decision)  On your next visit with your primary care physician please Get Medicines reviewed and adjusted.  If you experience worsening of your admission symptoms, develop shortness of breath, life threatening emergency, suicidal or homicidal thoughts you must seek medical attention immediately by calling 911 or calling your MD immediately  if symptoms less severe.  You Must read complete instructions/literature along with all the possible adverse reactions/side effects for all the Medicines you take and that have been prescribed to you. Take any new Medicines after you have completely understood and accpet all the possible adverse reactions/side effects.   Do not drive, operate heavy machinery, perform activities at heights, swimming or participation in water  activities or provide baby sitting services if your were admitted for syncope or siezures until you have seen by Primary MD or a Neurologist and advised to do so again.  Do not drive when taking Pain medications.   Procedures/Studies: ECHOCARDIOGRAM COMPLETE Result Date: 01/02/2024     ECHOCARDIOGRAM REPORT   Patient Name:   Riley Clarke Date of Exam: 01/02/2024 Medical Rec #:  [id number]        Height:       72.0 in Accession #:    [id number]       Weight:       228.4 lb Date of Birth:  [birth year]        BSA:          2.254 m Patient Age:    68 years         BP:           114/47 mmHg Patient Gender: M                HR:           80 bpm. Exam Location:  Inpatient Procedure: 2D Echo, Cardiac Doppler, Color Doppler and Intracardiac            Opacification Agent (Both Spectral and Color Flow Doppler were            utilized during procedure). STAT ECHO Indications:    Atrial Fibrillation  History:        Patient has no prior history of Echocardiogram examinations.                 Risk Factors:Diabetes, Former Smoker, Hypertension and                 Dyslipidemia.  Sonographer:    Koleen Popper RDCS Referring Phys: [id number] OKORONKWO U OGAN IMPRESSIONS  1. Left ventricular ejection fraction, by estimation, is 60 to 65%. The left ventricle has normal function. The left ventricle has no regional wall motion abnormalities. Left ventricular diastolic parameters were normal.  2. Right ventricular systolic function is moderately reduced. The right ventricular  size is mildly enlarged. There is normal pulmonary artery systolic pressure.  3. The mitral valve is normal in structure. No evidence of mitral valve regurgitation. No evidence of mitral stenosis.  4. The aortic valve is normal in structure. Aortic valve regurgitation is not visualized. No aortic stenosis is present.  5. The inferior vena cava is normal in size with greater than 50% respiratory variability, suggesting right atrial pressure of 3 mmHg. FINDINGS  Left Ventricle: Left ventricular ejection fraction, by estimation, is 60 to 65%. The left ventricle has normal function. The left ventricle has no regional wall motion abnormalities. Definity  contrast agent was given IV to delineate the left ventricular  endocardial borders. The left  ventricular internal cavity size was normal in size. There is no left ventricular hypertrophy. Left ventricular diastolic function could not be evaluated due to atrial fibrillation. Left ventricular diastolic parameters were normal. Right Ventricle: The right ventricular size is mildly enlarged. No increase in right ventricular wall thickness. Right ventricular systolic function is moderately reduced. There is normal pulmonary artery systolic pressure. The tricuspid regurgitant velocity is 2.24 m/s, and with an assumed right atrial pressure of 3 mmHg, the estimated right ventricular systolic pressure is 23.1 mmHg. Left Atrium: Left atrial size was normal in size. Right Atrium: Right atrial size was normal in size. Pericardium: There is no evidence of pericardial effusion. Mitral Valve: The mitral valve is normal in structure. No evidence of mitral valve regurgitation. No evidence of mitral valve stenosis. Tricuspid Valve: The tricuspid valve is normal in structure. Tricuspid valve regurgitation is trivial. No evidence of tricuspid stenosis. Aortic Valve: The aortic valve is normal in structure. Aortic valve regurgitation is not visualized. No aortic stenosis is present. Pulmonic Valve: The pulmonic valve was normal in structure. Pulmonic valve regurgitation is not visualized. No evidence of pulmonic stenosis. Aorta: The aortic root is normal in size and structure. Venous: The inferior vena cava is normal in size with greater than 50% respiratory variability, suggesting right atrial pressure of 3 mmHg. IAS/Shunts: No atrial level shunt detected by color flow Doppler.  LEFT VENTRICLE PLAX 2D LVIDd:         4.30 cm   Diastology LVIDs:         2.50 cm   LV e' medial:    6.31 cm/s LV PW:         1.20 cm   LV E/e' medial:  11.8 LV IVS:        1.10 cm   LV e' lateral:   6.96 cm/s LVOT diam:     2.10 cm   LV E/e' lateral: 10.7 LVOT Area:     3.46 cm  IVC IVC diam: 1.90 cm LEFT ATRIUM             Index LA diam:        3.20  cm 1.42 cm/m LA Vol (A2C):   56.2 ml 24.93 ml/m LA Vol (A4C):   64.9 ml 28.79 ml/m LA Biplane Vol: 60.6 ml 26.88 ml/m   AORTA Ao Root diam: 3.40 cm Ao Asc diam:  2.80 cm MITRAL VALVE               TRICUSPID VALVE MV Area (PHT): 3.37 cm    TR Peak grad:   20.1 mmHg MV Decel Time: 225 msec    TR Vmax:        224.00 cm/s MV E velocity: 74.40 cm/s MV A velocity: 53.90 cm/s  SHUNTS MV E/A ratio:  1.38  Systemic Diam: 2.10 cm Morene Brownie Electronically signed by Morene Brownie Signature Date/Time: 01/02/2024/4:22:18 PM    Final    DG Chest Port 1 View Result Date: 01/01/2024 EXAM: 1 VIEW XRAY OF THE CHEST 01/01/2024 11:25:00 PM COMPARISON: Earlier today at 14:01 hours. CLINICAL HISTORY: Encounter for central line placement. FINDINGS: LUNGS AND PLEURA: No focal pulmonary opacity. No pulmonary edema. No pleural effusion. No pneumothorax. HEART AND MEDIASTINUM: No acute abnormality of the cardiac and mediastinal silhouettes. BONES AND SOFT TISSUES: No acute osseous abnormality. LINES AND TUBES: Right IJ venous catheter terminates at the cavoatrial junction in appropriate position. IMPRESSION: 1. Right IJ venous catheter terminates at the cavoatrial junction. 2. No pneumothorax. Electronically signed by: Pinkie Pebbles MD 01/01/2024 11:29 PM EDT RP Workstation: [id number]   CT ABDOMEN PELVIS WO CONTRAST Result Date: 01/01/2024 CLINICAL DATA:  Abdominal pain, acute, nonlocalized. EXAM: CT ABDOMEN AND PELVIS WITHOUT CONTRAST TECHNIQUE: Multidetector CT imaging of the abdomen and pelvis was performed following the standard protocol without IV contrast. RADIATION DOSE REDUCTION: This exam was performed according to the departmental dose-optimization program which includes automated exposure control, adjustment of the mA and/or kV according to patient size and/or use of iterative reconstruction technique. COMPARISON:  CT abdomen/pelvis dated 08/30/2020. FINDINGS: Lower chest: No acute abnormality.  Hepatobiliary: No focal liver abnormality is seen. No gallstones, gallbladder wall thickening, or biliary dilatation. Pancreas: Unremarkable. No pancreatic ductal dilatation or surrounding inflammatory changes. Spleen: Normal in size without focal abnormality. Adrenals/Urinary Tract: Adrenal glands are unremarkable. No urolithiasis or hydronephrosis. Bladder is minimally distended and otherwise grossly unremarkable. Stomach/Bowel: Stomach is within normal limits. Appendix appears normal. No evidence of bowel wall thickening, distention, or inflammatory changes. Vascular/Lymphatic: Abdominal aorta is normal in caliber with scattered atherosclerotic calcification. No enlarged abdominal or pelvic lymph nodes. Reproductive: Prostate is enlarged measuring up to 5.4 cm diameter, similar to the prior exam. Other: No abdominal wall hernia or abnormality. No abdominopelvic ascites. No free air. Musculoskeletal: No acute osseous abnormality. No suspicious osseous lesion. Status post PLIF L4-S1. Multilevel degenerative changes of the thoracolumbar spine. IMPRESSION: 1. No acute localizing findings in the abdomen or pelvis. 2. Mild prostatomegaly. 3.  Aortic Atherosclerosis (ICD10-I70.0). Electronically Signed   By: Harrietta Sherry M.D.   On: 01/01/2024 15:50   DG Chest Port 1 View Result Date: 01/01/2024 CLINICAL DATA:  Nausea, vomiting, and diarrhea.  Possible sepsis. EXAM: PORTABLE CHEST 1 VIEW COMPARISON:  10/31/2011 FINDINGS: Thoracic spondylosis. Cardiac and mediastinal margins appear normal. The lungs appear clear. No blunting of the costophrenic angles. Moderate degenerative glenohumeral arthropathy bilaterally. IMPRESSION: 1. No acute findings. 2. Thoracic spondylosis. 3. Moderate degenerative glenohumeral arthropathy bilaterally. Electronically Signed   By: Ryan Salvage M.D.   On: 01/01/2024 14:36     The results of significant diagnostics from this hospitalization (including imaging, microbiology,  ancillary and laboratory) are listed below for reference.     Microbiology: Recent Results (from the past 240 hours)  MRSA Next Gen by PCR, Nasal     Status: None   Collection Time: 01/01/24  4:06 PM   Specimen: Nasal Mucosa; Nasal Swab  Result Value Ref Range Status   MRSA by PCR Next Gen NOT DETECTED NOT DETECTED Final    Comment: (NOTE) The GeneXpert MRSA Assay (FDA approved for NASAL specimens only), is one component of a comprehensive MRSA colonization surveillance program. It is not intended to diagnose MRSA infection nor to guide or monitor treatment for MRSA infections. Test performance is not FDA approved in patients  less than 65 years old. Performed at [facility], [address] [place], [place] [address]   Resp panel by RT-PCR (RSV, Flu A&B, Covid) Urine, Clean Catch     Status: None   Collection Time: 01/01/24  4:07 PM   Specimen: Urine, Clean Catch; Nasal Swab  Result Value Ref Range Status   SARS Coronavirus 2 by RT PCR NEGATIVE NEGATIVE Final   Influenza A by PCR NEGATIVE NEGATIVE Final   Influenza B by PCR NEGATIVE NEGATIVE Final    Comment: (NOTE) The Xpert Xpress SARS-CoV-2/FLU/RSV plus assay is intended as an aid in the diagnosis of influenza from Nasopharyngeal swab specimens and should not be used as a sole basis for treatment. Nasal washings and aspirates are unacceptable for Xpert Xpress SARS-CoV-2/FLU/RSV testing.  Fact Sheet for Patients: BloggerCourse.com  Fact Sheet for Healthcare Providers: SeriousBroker.it  This test is not yet approved or cleared by the [place]  FDA and has been authorized for detection and/or diagnosis of SARS-CoV-2 by FDA under an Emergency Use Authorization (EUA). This EUA will remain in effect (meaning this test can be used) for the duration of the COVID-19 declaration under Section 564(b)(1) of the Act, 21 U.S.C. section 360bbb-3(b)(1), unless the  authorization is terminated or revoked.     Resp Syncytial Virus by PCR NEGATIVE NEGATIVE Final    Comment: (NOTE) Fact Sheet for Patients: BloggerCourse.com  Fact Sheet for Healthcare Providers: SeriousBroker.it  This test is not yet approved or cleared by the [place]  FDA and has been authorized for detection and/or diagnosis of SARS-CoV-2 by FDA under an Emergency Use Authorization (EUA). This EUA will remain in effect (meaning this test can be used) for the duration of the COVID-19 declaration under Section 564(b)(1) of the Act, 21 U.S.C. section 360bbb-3(b)(1), unless the authorization is terminated or revoked.  Performed at [facility], [address] [place], [place] [address]   Gastrointestinal Panel by PCR , Stool     Status: Abnormal   Collection Time: 01/02/24  2:30 AM   Specimen: Stool  Result Value Ref Range Status   Campylobacter species NOT DETECTED NOT DETECTED Final   Plesimonas shigelloides NOT DETECTED NOT DETECTED Final   Salmonella species DETECTED (A) NOT DETECTED Final    Comment: RESULT CALLED TO, READ BACK BY AND VERIFIED WITH: YENI GRANADOS 1055 01/02/2024 DLB    Yersinia enterocolitica NOT DETECTED NOT DETECTED Final   Vibrio species NOT DETECTED NOT DETECTED Final   Vibrio cholerae NOT DETECTED NOT DETECTED Final   Enteroaggregative E coli (EAEC) NOT DETECTED NOT DETECTED Final   Enteropathogenic E coli (EPEC) DETECTED (A) NOT DETECTED Final    Comment: RESULT CALLED TO, READ BACK BY AND VERIFIED WITH: YENI GRANADOS 1055 01/02/2024 DLB    Enterotoxigenic E coli (ETEC) DETECTED (A) NOT DETECTED Final    Comment: RESULT CALLED TO, READ BACK BY AND VERIFIED WITH: YENI GRANADOS 1055 01/02/2024 DLB    Shiga like toxin producing E coli (STEC) NOT DETECTED NOT DETECTED Final   Shigella/Enteroinvasive E coli (EIEC) NOT DETECTED NOT DETECTED Final   Cryptosporidium NOT DETECTED NOT DETECTED  Final   Cyclospora cayetanensis NOT DETECTED NOT DETECTED Final   Entamoeba histolytica NOT DETECTED NOT DETECTED Final   Giardia lamblia NOT DETECTED NOT DETECTED Final   Adenovirus F40/41 NOT DETECTED NOT DETECTED Final   Astrovirus NOT DETECTED NOT DETECTED Final   Norovirus GI/GII NOT DETECTED NOT DETECTED Final   Rotavirus A NOT DETECTED NOT DETECTED Final  Sapovirus (I, II, IV, and V) NOT DETECTED NOT DETECTED Final    Comment: Performed at [facility], [address] [place], [place] [address]  C Difficile Quick Screen w PCR reflex     Status: None   Collection Time: 01/02/24  2:30 AM  Result Value Ref Range Status   C Diff antigen NEGATIVE NEGATIVE Final   C Diff toxin NEGATIVE NEGATIVE Final   C Diff interpretation No C. difficile detected.  Final    Comment: Performed at [facility], [address], [place], [place] [address]  Urine Culture     Status: None   Collection Time: 01/02/24  4:00 AM   Specimen: Urine, Random  Result Value Ref Range Status   Specimen Description URINE, RANDOM  Final   Special Requests NONE Reflexed from [id number]  Final   Culture   Final    NO GROWTH Performed at [facility], [address] [place], [place] [address]    Report Status 01/03/2024 FINAL  Final     Labs: BNP (last 3 results) No results for input(s): BNP in the last 8760 hours. Basic Metabolic Panel: Recent Labs  Lab 01/06/24 0719 01/07/24 0732 01/07/24 1922 01/08/24 9366 01/09/24 0606 01/10/24 0640 01/10/24 0641  NA 140 137 136 136 135 138 139  K 3.2* 2.8* 3.4* 3.6 3.8 4.0 4.1  CL 102 99 107 106 109 110 110  CO2 23 20* 19* 16* 17* 19* 19*  GLUCOSE 149* 222* 201* 130* 143* 141* 138*  BUN 17 12 12 12 8 8 8   CREATININE 0.85 0.74 0.62 0.61 0.62 0.64 0.67  CALCIUM  8.4* 8.5* 8.0* 8.4* 8.1* 7.9* 8.0*  MG 1.7 1.5*  --  1.7 1.5* 1.6*  --   PHOS 2.5 3.6  --  2.9 2.5 3.0 3.0   Liver Function Tests: Recent Labs  Lab 01/06/24 0719 01/07/24 0732  01/08/24 0633 01/09/24 0606 01/10/24 0641  ALBUMIN 2.4* 2.3* 2.4* 2.1* 2.2*   No results for input(s): LIPASE, AMYLASE in the last 168 hours. No results for input(s): AMMONIA in the last 168 hours. CBC: Recent Labs  Lab 01/06/24 0719 01/07/24 0732 01/08/24 9366 01/09/24 0606 01/10/24 0640  WBC 16.0* 16.7* 14.6* 12.1* 12.7*  HGB 10.3* 9.8* 10.4* 9.5* 8.9*  HCT 32.1* 30.9* 33.1* 30.9* 28.6*  MCV 74.8* 76.7* 79.4* 78.4* 78.8*  PLT 139* 136* 209 281 420*   Cardiac Enzymes: No results for input(s): CKTOTAL, CKMB, CKMBINDEX, TROPONINI in the last 168 hours. BNP: Invalid input(s): POCBNP CBG: Recent Labs  Lab 01/09/24 1132 01/09/24 1726 01/09/24 1957 01/10/24 0842 01/10/24 1152  GLUCAP 202* 261* 179* 251* 164*   D-Dimer No results for input(s): DDIMER in the last 72 hours. Hgb A1c No results for input(s): HGBA1C in the last 72 hours. Lipid Profile No results for input(s): CHOL, HDL, LDLCALC, TRIG, CHOLHDL, LDLDIRECT in the last 72 hours. Thyroid  function studies No results for input(s): TSH, T4TOTAL, T3FREE, THYROIDAB in the last 72 hours.  Invalid input(s): FREET3 Anemia work up No results for input(s): VITAMINB12, FOLATE, FERRITIN, TIBC, IRON, RETICCTPCT in the last 72 hours. Urinalysis    Component Value Date/Time   COLORURINE YELLOW 01/02/2024 0400   APPEARANCEUR CLOUDY (A) 01/02/2024 0400   LABSPEC 1.012 01/02/2024 0400   PHURINE 5.0 01/02/2024 0400   GLUCOSEU >=500 (A) 01/02/2024 0400   HGBUR MODERATE (A) 01/02/2024 0400   BILIRUBINUR NEGATIVE 01/02/2024 0400   BILIRUBINUR neg 10/20/2020 1401   KETONESUR NEGATIVE 01/02/2024  0400   PROTEINUR 100 (A) 01/02/2024 0400   UROBILINOGEN 0.2 10/20/2020 1401   UROBILINOGEN 1.0 07/25/2017 1518   NITRITE NEGATIVE 01/02/2024 0400   LEUKOCYTESUR NEGATIVE 01/02/2024 0400   Sepsis Labs Recent Labs  Lab 01/07/24 0732 01/08/24 0633 01/09/24 0606 01/10/24 0640   WBC 16.7* 14.6* 12.1* 12.7*   Microbiology Recent Results (from the past 240 hours)  MRSA Next Gen by PCR, Nasal     Status: None   Collection Time: 01/01/24  4:06 PM   Specimen: Nasal Mucosa; Nasal Swab  Result Value Ref Range Status   MRSA by PCR Next Gen NOT DETECTED NOT DETECTED Final    Comment: (NOTE) The GeneXpert MRSA Assay (FDA approved for NASAL specimens only), is one component of a comprehensive MRSA colonization surveillance program. It is not intended to diagnose MRSA infection nor to guide or monitor treatment for MRSA infections. Test performance is not FDA approved in patients less than 57 years old. Performed at [facility], [address], [place], [place] [address]   Resp panel by RT-PCR (RSV, Flu A&B, Covid) Urine, Clean Catch     Status: None   Collection Time: 01/01/24  4:07 PM   Specimen: Urine, Clean Catch; Nasal Swab  Result Value Ref Range Status   SARS Coronavirus 2 by RT PCR NEGATIVE NEGATIVE Final   Influenza A by PCR NEGATIVE NEGATIVE Final   Influenza B by PCR NEGATIVE NEGATIVE Final    Comment: (NOTE) The Xpert Xpress SARS-CoV-2/FLU/RSV plus assay is intended as an aid in the diagnosis of influenza from Nasopharyngeal swab specimens and should not be used as a sole basis for treatment. Nasal washings and aspirates are unacceptable for Xpert Xpress SARS-CoV-2/FLU/RSV testing.  Fact Sheet for Patients: BloggerCourse.com  Fact Sheet for Healthcare Providers: SeriousBroker.it  This test is not yet approved or cleared by the [place]  FDA and has been authorized for detection and/or diagnosis of SARS-CoV-2 by FDA under an Emergency Use Authorization (EUA). This EUA will remain in effect (meaning this test can be used) for the duration of the COVID-19 declaration under Section 564(b)(1) of the Act, 21 U.S.C. section 360bbb-3(b)(1), unless the authorization is terminated or revoked.      Resp Syncytial Virus by PCR NEGATIVE NEGATIVE Final    Comment: (NOTE) Fact Sheet for Patients: BloggerCourse.com  Fact Sheet for Healthcare Providers: SeriousBroker.it  This test is not yet approved or cleared by the [place]  FDA and has been authorized for detection and/or diagnosis of SARS-CoV-2 by FDA under an Emergency Use Authorization (EUA). This EUA will remain in effect (meaning this test can be used) for the duration of the COVID-19 declaration under Section 564(b)(1) of the Act, 21 U.S.C. section 360bbb-3(b)(1), unless the authorization is terminated or revoked.  Performed at [facility], [address], [place], [place] [address]   Gastrointestinal Panel by PCR , Stool     Status: Abnormal   Collection Time: 01/02/24  2:30 AM   Specimen: Stool  Result Value Ref Range Status   Campylobacter species NOT DETECTED NOT DETECTED Final   Plesimonas shigelloides NOT DETECTED NOT DETECTED Final   Salmonella species DETECTED (A) NOT DETECTED Final    Comment: RESULT CALLED TO, READ BACK BY AND VERIFIED WITH: YENI GRANADOS 1055 01/02/2024 DLB    Yersinia enterocolitica NOT DETECTED NOT DETECTED Final   Vibrio species NOT DETECTED NOT DETECTED Final   Vibrio cholerae NOT DETECTED NOT DETECTED Final   Enteroaggregative E coli (EAEC)  NOT DETECTED NOT DETECTED Final   Enteropathogenic E coli (EPEC) DETECTED (A) NOT DETECTED Final    Comment: RESULT CALLED TO, READ BACK BY AND VERIFIED WITH: YENI GRANADOS 1055 01/02/2024 DLB    Enterotoxigenic E coli (ETEC) DETECTED (A) NOT DETECTED Final    Comment: RESULT CALLED TO, READ BACK BY AND VERIFIED WITH: YENI GRANADOS 1055 01/02/2024 DLB    Shiga like toxin producing E coli (STEC) NOT DETECTED NOT DETECTED Final   Shigella/Enteroinvasive E coli (EIEC) NOT DETECTED NOT DETECTED Final   Cryptosporidium NOT DETECTED NOT DETECTED Final   Cyclospora cayetanensis NOT DETECTED  NOT DETECTED Final   Entamoeba histolytica NOT DETECTED NOT DETECTED Final   Giardia lamblia NOT DETECTED NOT DETECTED Final   Adenovirus F40/41 NOT DETECTED NOT DETECTED Final   Astrovirus NOT DETECTED NOT DETECTED Final   Norovirus GI/GII NOT DETECTED NOT DETECTED Final   Rotavirus A NOT DETECTED NOT DETECTED Final   Sapovirus (I, II, IV, and V) NOT DETECTED NOT DETECTED Final    Comment: Performed at [facility], [address] [place], [place] [address]  C Difficile Quick Screen w PCR reflex     Status: None   Collection Time: 01/02/24  2:30 AM  Result Value Ref Range Status   C Diff antigen NEGATIVE NEGATIVE Final   C Diff toxin NEGATIVE NEGATIVE Final   C Diff interpretation No C. difficile detected.  Final    Comment: Performed at [facility], [address], [place], [place] [address]  Urine Culture     Status: None   Collection Time: 01/02/24  4:00 AM   Specimen: Urine, Random  Result Value Ref Range Status   Specimen Description URINE, RANDOM  Final   Special Requests NONE Reflexed from [id number]  Final   Culture   Final    NO GROWTH Performed at [facility], [address] [place], [place] [address]    Report Status 01/03/2024 FINAL  Final     Time coordinating discharge:  I have spent 35 minutes face to face with the patient and on the ward discussing the patients care, assessment, plan and disposition with other care givers. >50% of the time was devoted counseling the patient about the risks and benefits of treatment/Discharge disposition and coordinating care.   SIGNED:   Burgess JAYSON Dare, MD  [facility] 01/10/2024, 4:12 PM   If 7PM-7AM, please contact night-coverage

## 2024-01-10 NOTE — Progress Notes (Signed)
 Mobility Specialist Progress Note:    01/10/24 1212  Mobility  Activity Ambulated with assistance (In hallway)  Level of Assistance Standby assist, set-up cues, supervision of patient - no hands on  Assistive Device None  Distance Ambulated (ft) 170 ft  Activity Response Tolerated well  Mobility Referral Yes  Mobility visit 1 Mobility  Mobility Specialist Start Time (ACUTE ONLY) 1151  Mobility Specialist Stop Time (ACUTE ONLY) 1201  Mobility Specialist Time Calculation (min) (ACUTE ONLY) 10 min   Received pt in bed and agreeable to mobility. No physical assistance needed. C/o BLE swelling, otherwise tolerated well. Returned to room and left pt in bed. Personal belongings and call light within reach. All needs met.  Riley Clarke Mobility Specialist  Please contact via [employer] or  Rehab Office [phone]'

## 2024-01-10 NOTE — TOC Progression Note (Signed)
 Transition of Care (TOC) - Progression Note   Patient now in agreement with home health PT/OT. No preference   Updated address on face sheet   Rolling walker and bedside commode for home at bedside   [facility] with [facility] accepted HHPT/OT referral.  Patient Details  Name: Riley Clarke MRN: [id number] Date of Birth: 07-16-1955  Transition of Care [facility]) CM/SW Contact  Adlene Adduci, Powell Jansky, RN Phone Number: 01/10/2024, 12:01 PM  Clinical Narrative:       Expected Discharge Plan: Home/Self Care Barriers to Discharge: Continued Medical Work up               Expected Discharge Plan and Services   Discharge Planning Services: CM Consult Post Acute Care Choice: Durable Medical Equipment Living arrangements for the past 2 months: Single Family Home Expected Discharge Date: 01/10/24               DME Arranged: BOB Finder rolling DME Agency: [employer] Date DME Agency Contacted: 01/08/24 Time DME Agency Contacted: [phone] Representative spoke with at DME Agency: [facility] HH Arranged: NA HH Agency: NA         Social Drivers of Health (SDOH) Interventions SDOH Screenings   Food Insecurity: No Food Insecurity (01/06/2024)  Housing: Low Risk  (01/06/2024)  Transportation Needs: No Transportation Needs (01/06/2024)  Utilities: Not At Risk (01/06/2024)  Alcohol Screen: Low Risk  (12/25/2023)  Depression (PHQ2-9): Low Risk  (12/27/2023)  Financial Resource Strain: Low Risk  (12/25/2023)  Physical Activity: Inactive (12/25/2023)  Social Connections: Unknown (01/06/2024)  Stress: No Stress Concern Present (12/25/2023)  Tobacco Use: Medium Risk (01/07/2024)    Readmission Risk Interventions     No data to display

## 2024-01-10 NOTE — Inpatient Diabetes Management (Addendum)
 Inpatient Diabetes Program Recommendations  AACE/ADA: New Consensus Statement on Inpatient Glycemic Control (2015)  Target Ranges:  Prepandial:   less than 140 mg/dL      Peak postprandial:   less than 180 mg/dL (1-2 hours)      Critically ill patients:  140 - 180 mg/dL   Lab Results  Component Value Date   GLUCAP 164 (H) 01/10/2024   HGBA1C 10.3 (A) 10/15/2023    Review of Glycemic Control  Diabetes history: DM2 Outpatient Diabetes medications: Jardiance  25 mg daily, Tradjenta  5 mg daily, Metformin  1000 mg BID, Mounjaro  7.5 mg weekly (not taking) Current orders for Inpatient glycemic control: Semglee  30 units daily, Novolog  0-15 units scale TID, Novolog  0-5 units HS scale, Novolog  4 units TID with meals  Inpatient Diabetes Program Recommendations:   Spoke to patient at the bedside. Following up with the Freestyle Libre 3+ sensor. Patient has an I phone, but unable to see the instructions due to not having his glasses. He prefers to do this at home, create his account with password. Was able to get the app on his phone. Given written instructions and 2 Freestyle libre 3+ sensor samples. States that he has a relative that is with EMS and can help with these if he needs help. Will have prescription for sensors and a reader if the phone not work out. States that he does have a blood glucose meter and strips at home for backup.   Reviewed the discharge orders for insulin  with Lantus  and Humalog  and how to give. Patient feels confident in giving insulin , reviewed the kinds of insulin , injection sites, timing of insulins, hypoglyemia and how to treat. Patient will follow up with PCP as soon as possible after discharge. Will not be taking Mounjaro  at home, since he feels that it made him sick. Stressed the importance of follow up with PCP for insulin  dosages, etc.    Discharge Recommendations: Long acting recommendations: Insulin  Glargine (LANTUS ) Solostar Pen determine at DC  Short acting  recommendations:  Meal + Correction coverage Insulin  lispro (HUMALOG ) KwikPen  Sensitive Scale.  determin at DC   Use Adult Diabetes Insulin  Treatment Post Discharge order set.  Marjorie Lunger RN BSN CDE Diabetes Coordinator Pager: [phone]  8am-5pm

## 2024-01-11 ENCOUNTER — Other Ambulatory Visit: Payer: Self-pay

## 2024-01-11 ENCOUNTER — Telehealth: Payer: Self-pay | Admitting: *Deleted

## 2024-01-11 NOTE — Transitions of Care (Post Inpatient/ED Visit) (Signed)
   01/11/2024  Name: KAVIAN PETERS MRN: [id number] DOB: [birth year]  Today's TOC FU Call Status: Today's TOC FU Call Status:: Unsuccessful Call (1st Attempt) Unsuccessful Call (1st Attempt) Date: 01/11/24  Attempted to reach the patient regarding the most recent Inpatient/ED visit.  RNCM briefly spoke with Mr. Rogan. He was trying to sleep and request a call back at another time.  Follow Up Plan: Additional outreach attempts will be made to reach the patient to complete the Transitions of Care (Post Inpatient/ED visit) call.   Andrea Dimes RN, BSN [place]  [facility] RN Care Manager [phone]

## 2024-01-12 ENCOUNTER — Other Ambulatory Visit: Payer: Self-pay | Admitting: Nurse Practitioner

## 2024-01-14 ENCOUNTER — Telehealth: Payer: Self-pay

## 2024-01-14 NOTE — Transitions of Care (Post Inpatient/ED Visit) (Signed)
   01/14/2024  Name: Riley Clarke MRN: [id number] DOB: May 01, 1956  Today's TOC FU Call Status: Today's TOC FU Call Status:: Successful TOC FU Call Completed TOC FU Call Complete Date: 01/14/24 [facility] RN spoke with patient who declined program/calls and states This is not my first rodeo Ive had 8 surgeries and I see my doctor thursday I'm good) Patient's Name and Date of Birth confirmed.  Shona Prow RN, CCM [place] [facility]  [facility] RN Care Manager [phone]

## 2024-01-15 ENCOUNTER — Telehealth: Payer: Self-pay

## 2024-01-15 NOTE — Telephone Encounter (Signed)
 Copied from CRM [id number]. Topic: General - Other >> Jan 14, 2024  4:44 PM Carlyon D wrote: Reason for CRM: [facility]. Needs a home verbal order from NP [facility] 1 time a week for 6 weeks. Please call back to give verbal order.  Rocky Haver Phone number [phone]  Suzen Shove   CMA II

## 2024-01-16 ENCOUNTER — Ambulatory Visit (INDEPENDENT_AMBULATORY_CARE_PROVIDER_SITE_OTHER): Admitting: Nurse Practitioner

## 2024-01-16 ENCOUNTER — Telehealth: Payer: Self-pay

## 2024-01-16 ENCOUNTER — Encounter: Payer: Self-pay | Admitting: Nurse Practitioner

## 2024-01-16 VITALS — BP 119/59 | HR 82 | Temp 98.5°F | Wt 217.2 lb

## 2024-01-16 DIAGNOSIS — E119 Type 2 diabetes mellitus without complications: Secondary | ICD-10-CM | POA: Diagnosis not present

## 2024-01-16 LAB — POCT GLYCOSYLATED HEMOGLOBIN (HGB A1C): Hemoglobin A1C: 11.7 % — AB (ref 4.0–5.6)

## 2024-01-16 NOTE — Telephone Encounter (Signed)
 Copied from CRM #[id number]. Topic: Clinical - Home Health Verbal Orders >> Jan 16, 2024 10:48 AM Elle L wrote: Caller/Agency: Rocky Haver with [facility] Callback Number: [phone] Service Requested: Physical Therapy Frequency: 1 week 6 Any new concerns about the patient? No

## 2024-01-16 NOTE — Telephone Encounter (Signed)
 Copied from CRM [id number]. Topic: Clinical - Medical Advice >> Jan 16, 2024  2:16 PM Marissa P wrote: Reason for CRM: Patient would like to see if he can put an request to get a donut for his but if possible please advise

## 2024-01-16 NOTE — Progress Notes (Signed)
 Subjective   Patient ID: Riley Clarke, male    DOB: 26-Feb-1956, 68 y.o.   MRN: [id number]  Chief Complaint  Patient presents with   Diabetes    Referring provider: [name], NP  Riley Clarke is a 68 y.o. male with Past Medical History: No date: Arthritis No date: Chronic back pain     Comment:  on chronic opioids No date: Diabetes mellitus without complication (HCC) No date: GERD (gastroesophageal reflux disease) No date: HLD (hyperlipidemia) No date: Hypertension     Comment:  no meds No date: Vitamin D  deficiency   HPI  Patient presents today for follow-up visit.  Overall he has been doing well other than back pain.  He states that he is compliant with medications.  A1c in office today is 11.7.  referral has been placed to pharmacy for medication management for diabetes. Denies f/c/s, n/v/d, hemoptysis, PND, leg swelling Denies chest pain or edema.  Recently hospitalized with GI issues. Insulin  was changed. Pharmacy is following with patient.  Allergies  Allergen Reactions   Cymbalta [Duloxetine Hcl] Nausea And Vomiting and Other (See Comments)    Dizziness Headaches    Shrimp [Shellfish Allergy] Nausea And Vomiting   Valium  [Diazepam ] Itching    Immunization History  Administered Date(s) Administered   Fluad Quad(high Dose 65+) 04/16/2023   Influenza,inj,Quad PF,6+ Mos 04/01/2015, 02/04/2016, 02/19/2017, 04/02/2018, 02/02/2021   Moderna SARS-COV2 Booster Vaccination 05/04/2020   Moderna Sars-Covid-2 Vaccination 08/27/2019, 09/24/2019   Pneumococcal Conjugate-13 04/01/2015, 10/11/2022   Pneumococcal Polysaccharide-23 05/08/2016   Tdap 09/06/2014, 04/01/2015    Tobacco History: Social History   Tobacco Use  Smoking Status Former   Current packs/day: 0.00   Average packs/day: 0.3 packs/day for 25.0 years (6.3 ttl pk-yrs)   Types: Cigarettes   Start date: 04/27/1995   Quit date: 04/26/2020   Years since quitting: 3.7  Smokeless Tobacco  Never   Counseling given: Not Answered   Outpatient Encounter Medications as of 01/16/2024  Medication Sig   Accu-Chek Softclix Lancets lancets Use 3 (three) times daily as directed to check blood sugar   Blood Glucose Monitoring Suppl (BLOOD GLUCOSE MONITOR SYSTEM) w/Device KIT Use 3 (three) times daily.   Continuous Glucose Receiver (FREESTYLE LIBRE 3 READER) DEVI Use 4 (four) times daily.   Continuous Glucose Sensor (FREESTYLE LIBRE 3 PLUS SENSOR) MISC Change sensor every 14 days.   cyclobenzaprine  (FLEXERIL ) 10 MG tablet TAKE 1 TABLET(10 MG) BY MOUTH THREE TIMES DAILY AS NEEDED FOR MUSCLE SPASMS   esomeprazole  (NEXIUM ) 40 MG capsule TAKE 1 CAPSULE BY MOUTH EVERY MORNING BEFORE BREAKFAST   fiber supplement, BANATROL TF, liquid Take 60 mLs by mouth 2 (two) times daily for 15 days.   gabapentin  (NEURONTIN ) 300 MG capsule Take 1 capsule (300 mg total) by mouth 3 (three) times daily.   Glucose Blood (BLOOD GLUCOSE TEST STRIPS) STRP Use 3 (three) times daily as directed to check blood sugar. .   insulin  glargine (LANTUS ) 100 UNIT/ML Solostar Pen Inject 30 Units into the skin daily.   insulin  lispro (HUMALOG ) 100 UNIT/ML KwikPen Inject 2 Units into the skin 3 (three) times daily with meals. If eating and Blood Glucose (BG) 80 or higher inject 2 units for meal coverage and add correction dose per scale. If not eating, correction dose only. BG <150= 0 unit; BG 150-200= 1 unit; BG 201-250= 2 unit; BG 251-300= 3 unit; BG 301-350= 4 unit; BG 351-400= 5 unit; BG >400= 6 unit and Call Primary Care.  Insulin  Pen Needle 32G X 4 MM MISC Use 3 (three) times daily.   JARDIANCE  25 MG TABS tablet TAKE 1 TABLET(25 MG) BY MOUTH DAILY   Lancet Device MISC 1 each by Does not apply route 3 (three) times daily. May dispense any manufacturer covered by patient's insurance.   lisinopril -hydrochlorothiazide  (ZESTORETIC ) 20-25 MG tablet TAKE 1 TABLET BY MOUTH DAILY   oxyCODONE -acetaminophen  (PERCOCET) 10-325 MG tablet  Take 1 tablet by mouth 4 (four) times daily as needed for pain.   simvastatin  (ZOCOR ) 10 MG tablet TAKE 1 TABLET(10 MG) BY MOUTH DAILY   No facility-administered encounter medications on file as of 01/16/2024.    Review of Systems  Review of Systems  Constitutional: Negative.   HENT: Negative.    Cardiovascular: Negative.   Gastrointestinal: Negative.   Allergic/Immunologic: Negative.   Neurological: Negative.   Psychiatric/Behavioral: Negative.       Objective:   BP (!) 119/59   Pulse 82   Temp 98.5 F (36.9 C) (Oral)   Wt 217 lb 3.2 oz (98.5 kg)   SpO2 98%   BMI 29.46 kg/m   Wt Readings from Last 5 Encounters:  01/16/24 217 lb 3.2 oz (98.5 kg)  01/10/24 209 lb 7 oz (95 kg)  12/27/23 221 lb (100.2 kg)  12/26/23 221 lb (100.2 kg)  11/14/23 233 lb 12.8 oz (106.1 kg)     Physical Exam Vitals and nursing note reviewed.  Constitutional:      General: He is not in acute distress.    Appearance: He is well-developed.  Cardiovascular:     Rate and Rhythm: Normal rate and regular rhythm.  Pulmonary:     Effort: Pulmonary effort is normal.     Breath sounds: Normal breath sounds.  Skin:    General: Skin is warm and dry.  Neurological:     Mental Status: He is alert and oriented to person, place, and time.       Assessment & Plan:   Type 2 diabetes mellitus without complication, without long-term current use of insulin  (HCC) -     POCT glycosylated hemoglobin (Hb A1C) -     CBC     Return in about 3 months (around 04/17/2024).   Bascom GORMAN Borer, NP 01/16/2024

## 2024-01-17 ENCOUNTER — Ambulatory Visit: Payer: Self-pay | Admitting: Nurse Practitioner

## 2024-01-17 ENCOUNTER — Other Ambulatory Visit: Payer: Self-pay

## 2024-01-17 ENCOUNTER — Other Ambulatory Visit: Payer: Self-pay | Admitting: Nurse Practitioner

## 2024-01-17 DIAGNOSIS — K642 Third degree hemorrhoids: Secondary | ICD-10-CM

## 2024-01-17 LAB — CBC
Hematocrit: 30.8 % — ABNORMAL LOW (ref 37.5–51.0)
Hemoglobin: 9 g/dL — ABNORMAL LOW (ref 13.0–17.7)
MCH: 24.3 pg — ABNORMAL LOW (ref 26.6–33.0)
MCHC: 29.2 g/dL — ABNORMAL LOW (ref 31.5–35.7)
MCV: 83 fL (ref 79–97)
Platelets: 674 x10E3/uL — ABNORMAL HIGH (ref 150–450)
RBC: 3.71 x10E6/uL — ABNORMAL LOW (ref 4.14–5.80)
RDW: 17.3 % — ABNORMAL HIGH (ref 11.6–15.4)
WBC: 9.2 x10E3/uL (ref 3.4–10.8)

## 2024-01-18 NOTE — Telephone Encounter (Signed)
 Please advise [facility]

## 2024-02-05 ENCOUNTER — Other Ambulatory Visit: Payer: Self-pay | Admitting: Student

## 2024-02-05 DIAGNOSIS — M4712 Other spondylosis with myelopathy, cervical region: Secondary | ICD-10-CM

## 2024-02-13 ENCOUNTER — Encounter: Payer: Self-pay | Admitting: Podiatry

## 2024-02-13 ENCOUNTER — Ambulatory Visit (INDEPENDENT_AMBULATORY_CARE_PROVIDER_SITE_OTHER): Admitting: Podiatry

## 2024-02-13 VITALS — Ht 72.0 in | Wt 217.0 lb

## 2024-02-13 DIAGNOSIS — M722 Plantar fascial fibromatosis: Secondary | ICD-10-CM

## 2024-02-13 MED ORDER — TRIAMCINOLONE ACETONIDE 10 MG/ML IJ SUSP
10.0000 mg | Freq: Once | INTRAMUSCULAR | Status: AC
  Administered 2024-02-13: 10 mg via INTRA_ARTICULAR

## 2024-02-13 NOTE — Progress Notes (Signed)
 Subjective:   Patient ID: Riley Clarke, male   DOB: 68 y.o.   MRN: [id number]   HPI Patient states he had been in the hospital but is feeling better now and has severe pain bottom of both heels that he wants worked   ROS      Objective:  Physical Exam  Neurovascular status intact with exquisite discomfort plantar aspect of the fascia bilateral with fluid buildup     Assessment:  Plantar fasciitis bilateral with fluid buildup at insertion     Plan:  H&P reviewed went ahead today sterile prep injected the plantar fascia at insertion 3 mg dexamethasone  Kenalog  5 mg Xylocaine  advised on anti-inflammatories as needed and good supportive shoe gear and reappoint hopefully in 3 months

## 2024-02-15 ENCOUNTER — Encounter: Payer: Self-pay | Admitting: Neurology

## 2024-02-17 ENCOUNTER — Ambulatory Visit
Admission: RE | Admit: 2024-02-17 | Discharge: 2024-02-17 | Disposition: A | Discharge status: Home / Self Care | Source: Ambulatory Visit | Attending: Student | Admitting: Student

## 2024-02-17 DIAGNOSIS — M4712 Other spondylosis with myelopathy, cervical region: Secondary | ICD-10-CM

## 2024-02-18 ENCOUNTER — Other Ambulatory Visit: Payer: Self-pay

## 2024-02-18 ENCOUNTER — Other Ambulatory Visit: Payer: Self-pay | Admitting: Nurse Practitioner

## 2024-02-19 ENCOUNTER — Other Ambulatory Visit: Payer: Self-pay

## 2024-02-19 MED ORDER — HYDROCORTISONE (PERIANAL) 2.5 % EX CREA: 1.0000 | TOPICAL_CREAM | Freq: Two times a day (BID) | CUTANEOUS | 0 refills | Status: DC

## 2024-02-19 NOTE — Telephone Encounter (Signed)
 1 refill sent. Patient will keep follow-up appointment with Camie Furbish, PA-C

## 2024-02-26 ENCOUNTER — Ambulatory Visit (INDEPENDENT_AMBULATORY_CARE_PROVIDER_SITE_OTHER): Payer: Self-pay

## 2024-02-26 VITALS — BP 129/62

## 2024-02-26 DIAGNOSIS — R030 Elevated blood-pressure reading, without diagnosis of hypertension: Secondary | ICD-10-CM | POA: Diagnosis not present

## 2024-02-26 DIAGNOSIS — E119 Type 2 diabetes mellitus without complications: Secondary | ICD-10-CM

## 2024-02-26 DIAGNOSIS — I152 Hypertension secondary to endocrine disorders: Secondary | ICD-10-CM | POA: Diagnosis not present

## 2024-02-26 MED ORDER — SIMVASTATIN 10 MG PO TABS: ORAL_TABLET | ORAL | 1 refills | Status: DC

## 2024-02-26 MED ORDER — INSULIN LISPRO (1 UNIT DIAL) 100 UNIT/ML (KWIKPEN): PEN_INJECTOR | SUBCUTANEOUS | 3 refills | Status: DC

## 2024-02-26 MED ORDER — EMPAGLIFLOZIN 25 MG PO TABS: 25.0000 mg | ORAL_TABLET | Freq: Every day | ORAL | 3 refills | Status: AC

## 2024-02-26 MED ORDER — FREESTYLE LIBRE 3 PLUS SENSOR MISC
3 refills | Status: AC
Start: 2024-02-26 — End: ?

## 2024-02-26 MED ORDER — LISINOPRIL-HYDROCHLOROTHIAZIDE 20-25 MG PO TABS: 1.0000 | ORAL_TABLET | Freq: Every day | ORAL | 3 refills | Status: AC

## 2024-02-26 MED ORDER — INSULIN GLARGINE 100 UNIT/ML SOLOSTAR PEN: 36.0000 [IU] | PEN_INJECTOR | Freq: Every day | SUBCUTANEOUS | 5 refills | Status: AC

## 2024-02-26 NOTE — Progress Notes (Signed)
 02/26/2024 Name: Riley Clarke MRN: [id number] DOB: 1956/01/20  Chief Complaint  Patient presents with   Diabetes    Riley Clarke is a 68 y.o. year old male who was referred for medication management by their primary care provider, [name], NP. They presented for a face to face visit today.   They were referred to the pharmacist by their PCP for assistance in managing diabetes, hypertension, and hyperlipidemia . PMH includes HTN, T2DM, BMI > 30, opioid dependence for chronic pain.   Subjective: Patient was seen by PCP, [name], on 10/15/23. BP was 135/66, HR 90. Patient reported taking his medications as prescribed. A1C had increased from 8.1% to 10.3%. Patient was seen by pharmacy in person on 11/14/23. He could not identify a specific cause for his recent increase in A1C. He was instructed to stop linaglipitin, stop Trulicity , and start Mounjaro . He was instructed to continue metformin  and Jardiance . He was seen for follow-up in person on 12/26/23 and reported that he started Mounjaro  but stopped taking metformin  and Jardiance . His BG continued to be very elevated. He was instructed to restart his medications and continue titrating Mounjaro . He was then hospitalized at [facility] from 7/22 to 7/31 for DKA in the setting of GI illness (slalmonella, EPEC, ETEC+). At discharge, he was instructed to stop Jardiance , metformin , and Mounjaro  and start Lantus  30 units daily and Humalog  2 units TID with meals + SS. At PCP f/u on 01/16/24, A1C had increased to 11.7%.  Patient reports doing well ok. He has still been trying to gain his strength back since hospital discharge. He states that he is taking Lantus  and Humalog . He did self-resume Jardiance  and has not had s/sx of recurrent DKA. He is not interested in restarted Mounjaro , because he believes it contributed to his GI illness.   Care Team: Primary Care Provider: [name], NP ; Next Scheduled Visit: needs to be scheduled ~ Dec  2025  Medication Access/Adherence  Current Pharmacy:  [facility] [address] [place] [address] Phone: [phone] Fax: [phone]  [id number] - [place], [place] - [address] [place] [address] Phone: [phone] Fax: [phone]   Patient reports affordability concerns with their medications: No  - [employer] Dual Complete, meds are free Patient reports access/transportation concerns to their pharmacy: No  Patient reports adherence concerns with their medications:  No    Diabetes:  Current medications: Lantus  30 units daily and Humalog  2 units TID with meals + SS (<150= 0 unit; BG 150-200= 1 unit; BG 201-250= 2 unit; BG 251-300= 3 unit; BG 301-350= 4 unit; BG 351-400= 5 unit; BG >400= 6 unit ) (reports he has been taking according to the scale - not adding correction to baseline of 2 units), also taking Jardiance  25 mg daily Medications tried in the past: metformin  IR 1000 mg BID with meals,  Mounjaro  5 mg weekly (Stopped after hospitalization for GI illness and DKA)  Current glucose readings: Has FL3+ reader with him today Using Accu Chek Guide meter as backup but not using   Last 14 days (BG averages): 12AM-6AM - 278 mg/dL [id number]-[id number] - 771 mg/dL 87EF-3EF - 735 mg/dL 3EF87-JF - 703 mg/dL  TAR - 20% TIR - 78% TBR - 0%  Sensor active - 93%  Patient denies hypoglycemic s/sx including dizziness, shakiness, sweating. Reports he may feel these symptoms if he skips a meal. Patient denies hyperglycemic symptoms  including polyuria, polydipsia, polyphagia, nocturia, neuropathy, denies blurred vision.  Current meal patterns: 3 meals per day (this morning took 2 units with his meal) - Breakfast: bowl of cereal, bacon-egg sandwich,  - Supper: brown rice and chicken. Large portion of rice - Drinks: sugar free lemonade, drinking 5-6x 16 oz water  bottle during the day  Current physical  activity: occasionally goes out and walk  Current medication access support: none- [employer] Dual Complete  Hypertension:  Current medications: lisinopril -hydrochlorothiazide  20-25 mg daily Medications previously tried: n/a  Patient has a automated, upper arm home BP cuff - not checking regularly at home Current blood pressure readings readings: N/A  Patient denies hypotensive s/sx including dizziness, lightheadedness.  Patient denies hypertensive symptoms including headache, chest pain, shortness of breath   Hyperlipidemia/ASCVD Risk Reduction  Current lipid lowering medications: simvastatin  10 mg daily Medications tried in the past: N/A  Antiplatelet regimen: N/A  ASCVD History: N/A Family History: heart disease in mother Risk Factors: T2DM, HTN, family history   Clinical ASCVD: No  The ASCVD Risk score (Arnett DK, et al., 2019) failed to calculate for the following reasons:   The valid total cholesterol range is 130 to 320 mg/dL    Objective:  BP Readings from Last 3 Encounters:  02/26/24 129/62  01/16/24 (!) 119/59  01/10/24 119/66    Lab Results  Component Value Date   HGBA1C 11.7 (A) 01/16/2024   HGBA1C 10.3 (A) 10/15/2023   HGBA1C 8.1 (A) 04/16/2023    Lab Results  Component Value Date   CREATININE 0.67 01/10/2024   BUN 8 01/10/2024   NA 139 01/10/2024   K 4.1 01/10/2024   CL 110 01/10/2024   CO2 19 (L) 01/10/2024    Lab Results  Component Value Date   CHOL 93 (L) 10/15/2023   HDL 49 10/15/2023   LDLCALC 22 10/15/2023   TRIG 129 10/15/2023   CHOLHDL 1.9 10/15/2023    Medications Reviewed Today     Reviewed by Brinda Lorain SQUIBB, RPH (Pharmacist) on 02/26/24 at 1455  Med List Status: <None>   Medication Order Taking? Sig Documenting Provider Last Dose Status Informant  Accu-Chek Softclix Lancets lancets [id number]  Use 3 (three) times daily as directed to check blood sugar Amin, Ankit C, MD  Active   Blood Glucose Monitoring Suppl (BLOOD GLUCOSE  MONITOR SYSTEM) w/Device KIT [id number]  Use 3 (three) times daily. Caleen Burgess BROCKS, MD  Active   Continuous Glucose Receiver (FREESTYLE LIBRE 3 READER) DEVI [id number] Yes Use 4 (four) times daily. Caleen Burgess BROCKS, MD  Active   Continuous Glucose Sensor (FREESTYLE LIBRE 3 PLUS SENSOR) MISC [id number]  Change sensor every 15 days. [name], NP  Active   cyclobenzaprine  (FLEXERIL ) 10 MG tablet [id number] Yes TAKE 1 TABLET(10 MG) BY MOUTH THREE TIMES DAILY AS NEEDED FOR MUSCLE SPASMS Nichols, [name] S, NP  Active   empagliflozin  (JARDIANCE ) 25 MG TABS tablet [id number]  Take 1 tablet (25 mg total) by mouth daily. [name], NP  Active   esomeprazole  (NEXIUM ) 40 MG capsule [id number]  TAKE 1 CAPSULE BY MOUTH EVERY MORNING BEFORE BREAKFAST Nichols, [name] S, NP  Active Self, Pharmacy Records  gabapentin  (NEURONTIN ) 300 MG capsule [id number] Yes Take 1 capsule (300 mg total) by mouth 3 (three) times daily. [name], NP  Active Self, Pharmacy Records           Med Note (COFFELL, ANGELA M   Thu Jan 03, 2024 12:28 PM) Pt states he often  misses mid-day dose.  Glucose Blood (BLOOD GLUCOSE TEST STRIPS) STRP [id number]  Use 3 (three) times daily as directed to check blood sugar. SABRA Dare, Ankit C, MD  Active   hydrocortisone  (ANUSOL -HC) 2.5 % rectal cream [id number]  Place 1 Application rectally 2 (two) times daily. Mansouraty, Aloha Raddle., MD  Active   insulin  glargine (LANTUS ) 100 UNIT/ML Solostar Pen [id number]  Inject 36 Units into the skin daily. May titrate up to 42 units daily as directed by your provider. [name], NP  Active   insulin  lispro (HUMALOG ) 100 UNIT/ML KwikPen [id number]  Inject 2 units + correction dose (per sliding scale) three times daily 15 minutes before each meal. (<150= 0 unit; BG 150-200= 2 unit; BG 201-250= 4 unit; BG 251-300= 6 unit; BG 301-350= 8 unit; BG 351-400= 10 unit; BG >400= 12 unit) [name], NP  Active   Insulin  Pen Needle 32G X 4 MM MISC [id number]  Use 3  (three) times daily. Dare Burgess BROCKS, MD  Active   Lancet Device MISC [id number]  1 each by Does not apply route 3 (three) times daily. May dispense any manufacturer covered by patient's insurance. Dare Burgess BROCKS, MD  Active   lisinopril -hydrochlorothiazide  (ZESTORETIC ) 20-25 MG tablet [id number]  Take 1 tablet by mouth daily. [name], NP  Active   oxyCODONE -acetaminophen  (PERCOCET) 10-325 MG tablet [id number]  Take 1 tablet by mouth 4 (four) times daily as needed for pain. [provider]  Active Self, Pharmacy Records  simvastatin  (ZOCOR ) 10 MG tablet [id number]  TAKE 1 TABLET(10 MG) BY MOUTH DAILY [name], NP  Active               Assessment/Plan:   Diabetes: - Currently uncontrolled with last A1C of 11.7% uncontrolled above goal < 7%. TIR uncontrolled since transitioning to insulin  at just 21% below goal > 70%. Will titrate basal and prandial insulin  today. Patient has self-resumed Jardiance  without issue. Will closely monitor for s/sx of GU infection or worsening BG that would put him at risk for DKA. Patient does not want to retrial GLP-1RA or GLP-1/GIP RA at this time.  - Reviewed long term cardiovascular and renal outcomes of uncontrolled blood sugar - Reviewed goal A1c, goal fasting, and goal 2 hour post prandial glucose - Reviewed dietary modifications including minimizing carbohydrate food and drinks, maximizing protein intake with meals.  - Recommend to INCREASE Lantus  to 36 units daily and increase by 2 units every week up to a maximum of 42 units daily to target a FBG of 80-130 mg/dL - Recommend to INCREASE Humalog  to 2 units + correction 15 minutes before each meal. New Scale: <150= 0 unit; BG 150-200= 2 unit; BG 201-250= 4 unit; BG 251-300= 6 unit; BG 301-350= 8 unit; BG 351-400= 10 unit; BG >400= 12 unit  - Recommend to continue Jardiance  25 mg daily - Patient requested that refills of all his medications are sent to his new preferred pharmacy [facility]) - Recommend to check glucose continuously with FL3+ CGM and reader   Hypertension: - Currently controlled with clinic BP consistently below goal < 130/80 mmHg.  - Reviewed long term cardiovascular and renal outcomes of uncontrolled blood pressure - Reviewed appropriate blood pressure monitoring technique and reviewed goal blood pressure. Recommended to check home blood pressure and heart rate at least once per week.  - Recommend to continue lisinopril -hydrochlorothiazide  20-25 mg daily  - Recommend BMP for monitoring Scr, K in ~ Dec 2025  Hyperlipidemia/ASCVD Risk Reduction: - Currently controlled with LDL-C of 22 mg/dL. Patient does have an indication for higher intensity statin with T2DM + comorbidities, but he is tolerating this medication well and LDL-C is very low. Therefore, will continue current regimen at this time - Reviewed long term complications of uncontrolled cholesterol - Recommend to continue simvastatin  10 mg daily   Follow Up Plan:  PCP needs to be scheduled (~Dec 2025), PharmD in person 03/25/24  Lorain Baseman, PharmD [facility] [phone]

## 2024-02-26 NOTE — Patient Instructions (Signed)
 It was nice to see you today!  Your goal blood sugar is 80-130 before eating and less than 180 after eating.  Let us  know if you have a low blood sugar or frequent symptoms of low blood sugar like feeling dizzy, shaky, or sweaty. If you have a low blood sugar less than 70 mg/dL, have a snack or drink with at least 15 grams of fast acting carbohydrates like juice or regular soda.  Medication Changes:  Increase Lantus  (insulin  glargine, light grey) to 36 units once daily in the morning.  You can increase by 2 units every week if your sugar in the morning is still above 200 mg/dL. Do not increase past 42 units until we talk again.  Inject Humalog  2 units + correction 15 minutes before each meal. Here is your new correction scale: <150= 0 unit; BG 150-200= 2 unit; BG 201-250= 4 unit; BG 251-300= 6 unit; BG 301-350= 8 unit; BG 351-400= 10 unit; BG >400= 12 unit  Continue all other medication the same.   I will send refills of your medications to the [facility] pharmacy, including more sensors. Bring your [place] 3 reader device to every appointment.  Keep up the good work with diet and exercise. Aim for a diet full of vegetables, fruit and lean meats (chicken, [place], fish). Try to limit salt intake by eating fresh or frozen vegetables (instead of canned), rinse canned vegetables prior to cooking and do not add any additional salt to meals.

## 2024-02-27 ENCOUNTER — Other Ambulatory Visit: Payer: Self-pay | Admitting: Nurse Practitioner

## 2024-02-27 NOTE — Telephone Encounter (Unsigned)
 Copied from CRM [id number]. Topic: Clinical - Medication Refill >> Feb 27, 2024  5:06 PM Everette C wrote: Medication: cyclobenzaprine  (FLEXERIL ) 10 MG tablet  Has the patient contacted their pharmacy? Yes (Agent: If no, request that the patient contact the pharmacy for the refill. If patient does not wish to contact the pharmacy document the reason why and proceed with request.) (Agent: If yes, when and what did the pharmacy advise?)  This is the patient's preferred pharmacy:  [id number] [place], [place] - [address] [place] [address] Phone: [phone] Fax: [phone]  Is this the correct pharmacy for this prescription? Yes If no, delete pharmacy and type the correct one.   Has the prescription been filled recently? Yes  Is the patient out of the medication? Yes  Has the patient been seen for an appointment in the last year OR does the patient have an upcoming appointment? Yes  Can we respond through MyChart? No  Agent: Please be advised that Rx refills may take up to 3 business days. We ask that you follow-up with your pharmacy.

## 2024-02-28 MED ORDER — CYCLOBENZAPRINE HCL 10 MG PO TABS: 10.0000 mg | ORAL_TABLET | Freq: Three times a day (TID) | ORAL | 0 refills | Status: DC | PRN

## 2024-03-25 ENCOUNTER — Ambulatory Visit: Payer: Self-pay

## 2024-03-25 ENCOUNTER — Other Ambulatory Visit: Payer: Self-pay

## 2024-03-25 VITALS — BP 130/64 | HR 96 | Wt 222.8 lb

## 2024-03-25 DIAGNOSIS — E119 Type 2 diabetes mellitus without complications: Secondary | ICD-10-CM | POA: Diagnosis not present

## 2024-03-25 DIAGNOSIS — M48062 Spinal stenosis, lumbar region with neurogenic claudication: Secondary | ICD-10-CM

## 2024-03-25 NOTE — Progress Notes (Signed)
 03/25/2024 Name: Riley Clarke MRN: [id number] DOB: May 29, 1956  Chief Complaint  Patient presents with   Diabetes    Riley Clarke is a 68 y.o. year old male who was referred for medication management by their primary care provider, Riley Bascom RAMAN, NP. They presented for a face to face visit today.   They were referred to the pharmacist by their PCP for assistance in managing diabetes, hypertension, and hyperlipidemia . PMH includes HTN, T2DM, BMI > 30, opioid dependence for chronic pain.   Subjective: Patient was seen by PCP, Riley Clarke, on 10/15/23. BP was 135/66, HR 90. Patient reported taking his medications as prescribed. A1C had increased from 8.1% to 10.3%. Patient was seen by pharmacy in person on 11/14/23. He could not identify a specific cause for his recent increase in A1C. He was instructed to stop linaglipitin, stop Trulicity , and start Mounjaro . He was instructed to continue metformin  and Jardiance . He was seen for follow-up in person on 12/26/23 and reported that he started Mounjaro  but stopped taking metformin  and Jardiance . His BG continued to be very elevated. He was instructed to restart his medications and continue titrating Mounjaro . He was then hospitalized at [facility] from 7/22 to 7/31 for DKA in the setting of GI illness (slalmonella, EPEC, ETEC+). At discharge, he was instructed to stop Jardiance , metformin , and Mounjaro  and start Lantus  30 units daily and Humalog  2 units TID with meals + SS. At PCP f/u on 01/16/24, A1C had increased to 11.7%. At pharmacy appt in person on 02/26/24, pt was taking Lantus  and Humalog  and had self-resumed Jardiance . He was not willing to restart Mounjaro . CGM demonstrated TIR of 21%, TAR 79%. We increased his Lantus  to 36 units (titrate up to 42) and increased Humalog  to 2 units with meals + sliding scale correction.   Today, patient reports doing ok. He did not increase Lantus  as instructed at last visit and continued to take 30 units daily. He is  averaging Humalog  4-6 units with each meal. Denies missed doses of either. He reports he takes Humalog  after he eats. He reports his appetite has improved but he is still not very hungry.  Care Team: Primary Care Provider: [name], NP ; Next Scheduled Visit: needs to be scheduled ~ Dec 2025  Medication Access/Adherence  Current Pharmacy:  [facility] [address] [place] [address] Phone: [phone] Fax: [phone]  [facility] [id number] - [place], [place] - [address] [place] [address] Phone: [phone] Fax: [phone]   Patient reports affordability concerns with their medications: No  - [employer] Dual Complete, meds are free Patient reports access/transportation concerns to their pharmacy: No  Patient reports adherence concerns with their medications:  No    Diabetes:  Current medications: Jardiance  25 mg daily, Lantus  30 units daily (taking 30 units daily), Humalog  2 units TID with meals + SS (<150= 0 unit; BG 150-200= 1 unit; BG 201-250= 2 unit; BG 251-300= 3 unit; BG 301-350= 4 unit; BG 351-400= 5 unit; BG >400= 6 unit ) (average 4-6 units with meals, taken after eating)  Medications tried in the past: metformin  IR 1000 mg BID with meals,  Mounjaro  5 mg weekly (Stopped after hospitalization for GI illness and DKA)  Current glucose readings: Has FL3+ reader with him today Using Accu Chek Guide meter as backup but not using       Patient denies hypoglycemic s/sx including dizziness, shakiness, sweating.  Patient denies hyperglycemic symptoms including polyuria, polydipsia, polyphagia, nocturia, blurred vision. Has stable neuropathy.  Current meal patterns: 3 meals per day - sometimes just having sandwich or ensure drink for a meal. Reports sugars are spike after ensure drinks. - Breakfast: bowl of cereal, bacon-egg sandwich,  - Supper: brown rice and chicken. Large portion of  rice - Drinks: sugar free lemonade, drinking 5-6x 16 oz water  bottle during the day  Current physical activity: occasionally goes out and walk  Current medication access support: none- [employer] Dual Complete  Hypertension:  Current medications: lisinopril -hydrochlorothiazide  20-25 mg daily Medications previously tried: n/a  Patient has a automated, upper arm home BP cuff - not checking regularly at home Current blood pressure readings readings: N/A  Patient denies hypotensive s/sx including dizziness, lightheadedness.  Patient denies hypertensive symptoms including headache, chest pain, shortness of breath   Hyperlipidemia/ASCVD Risk Reduction  Current lipid lowering medications: simvastatin  10 mg daily Medications tried in the past: N/A  Antiplatelet regimen: N/A  ASCVD History: N/A Family History: heart disease in mother Risk Factors: T2DM, HTN, family history   Clinical ASCVD: No  The ASCVD Risk score (Arnett DK, et al., 2019) failed to calculate for the following reasons:   The valid total cholesterol range is 130 to 320 mg/dL    Objective:  BP Readings from Last 3 Encounters:  03/25/24 130/64  02/26/24 129/62  01/16/24 (!) 119/59    Lab Results  Component Value Date   HGBA1C 11.7 (A) 01/16/2024   HGBA1C 10.3 (A) 10/15/2023   HGBA1C 8.1 (A) 04/16/2023    Lab Results  Component Value Date   CREATININE 0.67 01/10/2024   BUN 8 01/10/2024   NA 139 01/10/2024   K 4.1 01/10/2024   CL 110 01/10/2024   CO2 19 (L) 01/10/2024    Lab Results  Component Value Date   CHOL 93 (L) 10/15/2023   HDL 49 10/15/2023   LDLCALC 22 10/15/2023   TRIG 129 10/15/2023   CHOLHDL 1.9 10/15/2023    Medications Reviewed Today     Reviewed by Riley Clarke, RPH (Pharmacist) on 03/25/24 at 1425  Med List Status: <None>   Medication Order Taking? Sig Documenting Provider Last Dose Status Informant  Accu-Chek Softclix Lancets lancets [id number]  Use 3 (three) times daily as  directed to check blood sugar Clarke, Riley C, MD  Active   Blood Glucose Monitoring Suppl (BLOOD GLUCOSE MONITOR SYSTEM) w/Device KIT [id number]  Use 3 (three) times daily. Caleen Burgess BROCKS, MD  Active   Continuous Glucose Receiver (FREESTYLE LIBRE 3 READER) DEVI [id number]  Use 4 (four) times daily. Caleen Burgess BROCKS, MD  Active   Continuous Glucose Sensor (FREESTYLE LIBRE 3 PLUS SENSOR) MISC [id number]  Change sensor every 15 days. Riley Bascom RAMAN, NP  Active   cyclobenzaprine  (FLEXERIL ) 10 MG tablet [id number]  Take 1 tablet (10 mg total) by mouth 3 (three) times daily as needed for muscle spasms. Riley Bascom RAMAN, NP  Active   empagliflozin  (JARDIANCE ) 25 MG TABS tablet [id number] Yes Take 1 tablet (25 mg total) by mouth daily. Riley Bascom RAMAN, NP  Active   esomeprazole  (NEXIUM ) 40 MG capsule [id number]  TAKE 1 CAPSULE BY MOUTH EVERY MORNING BEFORE BREAKFAST Nichols, Riley Clarke S, NP  Active Self, Pharmacy Records  gabapentin  (NEURONTIN ) 300 MG capsule [id number] Yes Take 1 capsule (300 mg total) by mouth 3 (three) times daily. Riley Bascom RAMAN, NP  Active Self, Pharmacy Records  Med Note (COFFELL, ANGELA M   Thu Jan 03, 2024 12:28 PM) Pt states he often misses mid-day dose.  Glucose Blood (BLOOD GLUCOSE TEST STRIPS) STRP [id number]  Use 3 (three) times daily as directed to check blood sugar. SABRA Dare, Riley C, MD  Active   hydrocortisone  (ANUSOL -HC) 2.5 % rectal cream [id number]  Place 1 Application rectally 2 (two) times daily. Mansouraty, Aloha Raddle., MD  Active   insulin  glargine (LANTUS ) 100 UNIT/ML Solostar Pen [id number] Yes Inject 36 Units into the skin daily. May titrate up to 42 units daily as directed by your provider.  Patient taking differently: Inject 36 Units into the skin daily. May titrate up to 42 units daily as directed by your provider.   Nichols, Riley Clarke S, NP  Active   insulin  lispro (HUMALOG ) 100 UNIT/ML KwikPen [id number] Yes Inject 2 units + correction dose (per sliding scale) three times  daily 15 minutes before each meal. (<150= 0 unit; BG 150-200= 2 unit; BG 201-250= 4 unit; BG 251-300= 6 unit; BG 301-350= 8 unit; BG 351-400= 10 unit; BG >400= 12 unit) Riley Bascom RAMAN, NP  Active   Insulin  Pen Needle 32G X 4 MM MISC [id number]  Use 3 (three) times daily. Dare Burgess BROCKS, MD  Active   Lancet Device MISC [id number]  1 each by Does not apply route 3 (three) times daily. May dispense any manufacturer covered by patient's insurance. Dare Burgess BROCKS, MD  Active   lisinopril -hydrochlorothiazide  (ZESTORETIC ) 20-25 MG tablet [id number] Yes Take 1 tablet by mouth daily. Riley Bascom RAMAN, NP  Active   oxyCODONE -acetaminophen  (PERCOCET) 10-325 MG tablet [id number]  Take 1 tablet by mouth 4 (four) times daily as needed for pain. [provider]  Active Self, Pharmacy Records  simvastatin  (ZOCOR ) 10 MG tablet [id number] Yes TAKE 1 TABLET(10 MG) BY MOUTH DAILY Riley Bascom RAMAN, NP  Active               Assessment/Plan:   Diabetes: - Currently uncontrolled with last A1C of 11.7% uncontrolled above goal < 7%. TIR uncontrolled at 45%, but improved from 21% at last visit. Patient feels lack of BG control is primarily due to dietary excursion. Identified opportunity for improvement in dosing for prandial insulin . Patient continues to take Jardiance  without AE. Will closely monitor for s/sx of GU infection or worsening BG that would put him at risk for DKA. Patient does not want to retrial GLP-1RA or GLP-1/GIP RA at this time.  - Reviewed long term cardiovascular and renal outcomes of uncontrolled blood sugar - Reviewed goal A1c, goal fasting, and goal 2 hour post prandial glucose - Reviewed dietary modifications including minimizing carbohydrate food and drinks, maximizing protein intake with meals. Advised patient to look for food and drink (Ensure) options with less total carbohydrates.  - Recommend to continue Lantus  30 units daily - Recommend to take Humalog  2 units + correction 15  minutes before each meal as previously instructed. Scale: <150= 0 unit; BG 150-200= 2 unit; BG 201-250= 4 unit; BG 251-300= 6 unit; BG 301-350= 8 unit; BG 351-400= 10 unit; BG >400= 12 unit - Recommend to continue Jardiance  25 mg daily - Recommend to check glucose continuously with FL3+ CGM and reader   Hypertension: - Currently controlled with clinic BP consistently below goal < 130/80 mmHg.  - Reviewed long term cardiovascular and renal outcomes of uncontrolled blood pressure - Reviewed appropriate blood pressure monitoring technique and reviewed goal blood pressure. Recommended to check home blood pressure and heart  rate at least once per week.  - Recommend to continue lisinopril -hydrochlorothiazide  20-25 mg daily  - Recommend BMP for monitoring Scr, K in ~ Dec 2025   Hyperlipidemia/ASCVD Risk Reduction: - Currently controlled with LDL-C of 22 mg/dL. Patient does have an indication for higher intensity statin with T2DM + comorbidities, but he is tolerating this medication well and LDL-C is very low. Therefore, will continue current regimen at this time - Reviewed long term complications of uncontrolled cholesterol - Recommend to continue simvastatin  10 mg daily   Follow Up Plan:  PCP 04/30/24, PharmD in person 05/28/24  Lorain Baseman, PharmD [facility] [phone]

## 2024-03-25 NOTE — Patient Instructions (Signed)
 It was nice to see you today!  Your goal blood sugar is 80-130 before eating and less than 180 after eating.  Medication Changes: Continue Lantus  30 units daily  Start taking Humalog  2 units 15 minutes before each meal. Use this scale to add correctional insulin  if your blood sugar before eating is elevated:  <150= 0 unit BG 150-200= 2 unit BG 201-250= 4 unit BG 251-300= 6 unit BG 301-350= 8 unit BG 351-400= 10 unit BG >400= 12 unit  Example: If you are eating lunch and your blood sugar before eating is 220, you would take 6 (2 + 4) units 10-15 minutes before your meal.  Continue all other medication the same.   Monitor blood sugars at home and keep a log (glucometer or piece of paper) to bring with you to your next visit.  Keep up the good work with diet and exercise. Aim for a diet full of vegetables, fruit and lean meats (chicken, [place], fish). Try to limit salt intake by eating fresh or frozen vegetables (instead of canned), rinse canned vegetables prior to cooking and do not add any additional salt to meals.   Lorain Baseman, PharmD [facility] [phone]

## 2024-03-25 NOTE — Progress Notes (Unsigned)
 Patient requested refill of cyclobenzaprine  be sent to [facility] pharmacy during pharmacy clinic appt today. Will request refill from PCP.  Lorain Baseman, PharmD [facility] [phone]

## 2024-03-26 MED ORDER — CYCLOBENZAPRINE HCL 10 MG PO TABS
10.0000 mg | ORAL_TABLET | Freq: Three times a day (TID) | ORAL | 0 refills | Status: DC | PRN
Start: 2024-03-26 — End: 2024-04-17

## 2024-04-02 ENCOUNTER — Ambulatory Visit: Admitting: Gastroenterology

## 2024-04-02 ENCOUNTER — Other Ambulatory Visit

## 2024-04-02 ENCOUNTER — Ambulatory Visit: Payer: Self-pay | Admitting: Gastroenterology

## 2024-04-02 ENCOUNTER — Encounter: Payer: Self-pay | Admitting: Gastroenterology

## 2024-04-02 VITALS — BP 110/62 | HR 120 | Ht 72.0 in | Wt 220.5 lb

## 2024-04-02 DIAGNOSIS — D649 Anemia, unspecified: Secondary | ICD-10-CM

## 2024-04-02 DIAGNOSIS — K648 Other hemorrhoids: Secondary | ICD-10-CM

## 2024-04-02 DIAGNOSIS — K625 Hemorrhage of anus and rectum: Secondary | ICD-10-CM | POA: Diagnosis not present

## 2024-04-02 DIAGNOSIS — K649 Unspecified hemorrhoids: Secondary | ICD-10-CM

## 2024-04-02 LAB — BASIC METABOLIC PANEL WITH GFR
BUN: 16 mg/dL (ref 6–23)
CO2: 27 meq/L (ref 19–32)
Calcium: 9.5 mg/dL (ref 8.4–10.5)
Chloride: 97 meq/L (ref 96–112)
Creatinine, Ser: 0.86 mg/dL (ref 0.40–1.50)
GFR: 90.25 mL/min (ref >60.00)
Glucose, Bld: 299 mg/dL — ABNORMAL HIGH (ref 70–99)
Potassium: 3.9 meq/L (ref 3.5–5.1)
Sodium: 135 meq/L (ref 135–145)

## 2024-04-02 LAB — IBC + FERRITIN
Ferritin: 6.6 ng/mL — ABNORMAL LOW (ref 22.0–322.0)
Iron: 32 ug/dL — ABNORMAL LOW (ref 42–165)
Saturation Ratios: 6.2 % — ABNORMAL LOW (ref 20.0–50.0)
TIBC: 515.2 ug/dL — ABNORMAL HIGH (ref 250.0–450.0)
Transferrin: 368 mg/dL — ABNORMAL HIGH (ref 212.0–360.0)

## 2024-04-02 LAB — CBC WITH DIFFERENTIAL/PLATELET
Basophils Absolute: 0.1 K/uL (ref 0.0–0.1)
Basophils Relative: 0.6 % (ref 0.0–3.0)
Eosinophils Absolute: 0.1 K/uL (ref 0.0–0.7)
Eosinophils Relative: 0.7 % (ref 0.0–5.0)
HCT: 37.4 % — ABNORMAL LOW (ref 39.0–52.0)
Hemoglobin: 11.5 g/dL — ABNORMAL LOW (ref 13.0–17.0)
Lymphocytes Relative: 20 % (ref 12.0–46.0)
Lymphs Abs: 1.8 K/uL (ref 0.7–4.0)
MCHC: 30.8 g/dL (ref 30.0–36.0)
MCV: 71.7 fl — ABNORMAL LOW (ref 78.0–100.0)
Monocytes Absolute: 0.7 K/uL (ref 0.1–1.0)
Monocytes Relative: 7.5 % (ref 3.0–12.0)
Neutro Abs: 6.3 K/uL (ref 1.4–7.7)
Neutrophils Relative %: 71.2 % (ref 43.0–77.0)
Platelets: 461 K/uL — ABNORMAL HIGH (ref 150.0–400.0)
RBC: 5.22 Mil/uL (ref 4.22–5.81)
RDW: 17.5 % — ABNORMAL HIGH (ref 11.5–15.5)
WBC: 8.9 K/uL (ref 4.0–10.5)

## 2024-04-02 LAB — VITAMIN B12: Vitamin B-12: 219 pg/mL (ref 211–911)

## 2024-04-02 LAB — FOLATE: Folate: 19.8 ng/mL (ref >5.9)

## 2024-04-02 NOTE — Patient Instructions (Addendum)
 Your provider has requested that you go to the basement level for lab work before leaving today. Press B on the elevator. The lab is located at the first door on the left as you exit the elevator.  We have scheduled you for your first hemorrhoid banding with Dr. Legrand on 05/22/24 at 2:40 pm.   _______________________________________________________  If your blood pressure at your visit was 140/90 or greater, please contact your primary care physician to follow up on this.  _______________________________________________________  If you are age 50 or older, your body mass index should be between 23-30. Your Body mass index is 29.91 kg/m. If this is out of the aforementioned range listed, please consider follow up with your Primary Care Provider.  If you are age 39 or younger, your body mass index should be between 19-25. Your Body mass index is 29.91 kg/m. If this is out of the aformentioned range listed, please consider follow up with your Primary Care Provider.   ________________________________________________________  The [facility] providers would like to encourage you to use MYCHART to communicate with providers for non-urgent requests or questions.  Due to long hold times on the telephone, sending your provider a message by [facility] may be a faster and more efficient way to get a response.  Please allow 48 business hours for a response.  Please remember that this is for non-urgent requests.  _______________________________________________________  [facility] is using a team-based approach to care.  Your team is made up of your doctor and two to three APPS. Our APPS (Nurse Practitioners and Physician Assistants) work with your physician to ensure care continuity for you. They are fully qualified to address your health concerns and develop a treatment plan. They communicate directly with your gastroenterologist to care for you. Seeing the Advanced Practice Practitioners on  your physician's team can help you by facilitating care more promptly, often allowing for earlier appointments, access to diagnostic testing, procedures, and other specialty referrals.

## 2024-04-02 NOTE — Progress Notes (Signed)
 RAYFORD WILLIAMSEN [id number] 1956-03-28   Chief Complaint: Hemorrhoids  Referring Provider: [name], NP Primary GI MD: Dr. Wilhelmenia  HPI: Tanay Misuraca Faciane is a 68 y.o. male with past medical history of DM2, HTN, HLD, chronic low back pain, osteoarthritis, GERD who presents today for a complaint of hemorrhoids.    Last seen in office 04/20/2023 by Elida Shawl, NP for follow-up after colonoscopy.  Colonoscopy 03/28/2023 identified internal hemorrhoids and two 3 to 7 mm polyps which on pathology were a benign inflammatory polyp and a hyperplastic polyp.  Repeat colonoscopy in 10 years recommended.  Future hemorrhoidal banding was recommended if hemorrhoidal bleeding persisted.  At time of follow-up was feeling well and had been using Anusol  cream twice a day for about a week with improvement in hemorrhoidal bleeding.  Was advised to continue fiber supplement, increase water  intake, and to contact the office if rectal bleeding or hemorrhoidal swelling/irritation recurs, with consideration for hemorrhoid banding in future.  Recent hospital admission 01/01/2024 to 01/10/2024 for DKA.  Brought by EMS for 7 days of nausea, vomiting, diarrhea with abdominal pain.  Had been started on Mounjaro  7/14 and was taking metformin , lisinopril , HCTZ, and Jardiance .  Upon admission pH noted to be 6.9, lactate 10, creatinine 12.  Admitted for DKA and acute renal failure.  GI pathogen panel grew Salmonella, EPEC, and he was started on Rocephin .  Continued to have persistent diarrhea requiring oral bicarb due to metabolic derangements.  Symptoms gradually improved.  He was transitioned to brief course of Flagyl  from IV Rocephin .   Discussed the use of AI scribe software for clinical note transcription with the patient, who gave verbal consent to proceed.  History of Present Illness KAYN HAYMORE is a 68 year old male with a history of hemorrhoids who presents with recurrence of hemorrhoid  symptoms.  He has experienced a recurrence of hemorrhoid symptoms over the past two to three months, including protrusion and intermittent bleeding. He has been using a prescribed cream as directed, which has provided some relief but has not completely resolved the symptoms. He experiences mild pain associated with the hemorrhoids and notices bleeding occasionally when wiping. No pain during bowel movements.  He reports regular bowel movements and no longer experiences diarrhea, which he had during a hospital stay over the summer when he was diagnosed with an infection and diabetic ketoacidosis (DKA). He attributes the diarrhea to a juice he was given in the hospital. His stools are now solid, and he does not see blood in the stool itself, only on the toilet paper.  Previous GI Procedures/Imaging   CT A/P 01/01/2024 1. No acute localizing findings in the abdomen or pelvis. 2. Mild prostatomegaly. 3.  Aortic Atherosclerosis (ICD10-I70.0).  Colonoscopy 03/28/2023: - Hemorrhoids found on digital rectal exam. - Two 3 to 7 mm polyps at the recto-sigmoid colon and in the transverse colon, removed with a cold snare. Resected and retrieved.  - Diverticulosis in the entire examined colon.  - Normal mucosa in the entire examined colon otherwise.  - Non-bleeding non-thrombosed internal hemorrhoids - 10 year recall colonoscopy  1. Surgical [P], colon, rectosigmoid and transverse, polyp (2) :       BENIGN INFLAMMATORY POLYP       HYPERPLASTIC POLYP       NEGATIVE FOR DYSPLASIA AND CARCINOMA    Past Medical History:  Diagnosis Date   Arthritis    Chronic back pain    on chronic opioids   Diabetes mellitus  without complication (HCC)    GERD (gastroesophageal reflux disease)    HLD (hyperlipidemia)    Hypertension    no meds   Vitamin D  deficiency     Past Surgical History:  Procedure Laterality Date   ANTERIOR CERVICAL DECOMP/DISCECTOMY FUSION N/A 09/05/2022   Procedure: Anterior Cervical  Decompression Fusion  - Cervical three-Cervical four;  Surgeon: Louis Shove, MD;  Location: [facility];  Service: Neurosurgery;  Laterality: N/A;   CERVICAL DISC SURGERY  99   LUMBAR FUSION     LUMBAR LAMINECTOMY/DECOMPRESSION MICRODISCECTOMY Bilateral 03/09/2021   Procedure: Laminectomy and Foraminotomy - bilateral - Lumbar two-Lumbar three;  Surgeon: Louis Shove, MD;  Location: [facility] OR;  Service: Neurosurgery;  Laterality: Bilateral;   ROTATOR CUFF REPAIR Bilateral    SHOULDER ARTHROSCOPY WITH ROTATOR CUFF REPAIR AND SUBACROMIAL DECOMPRESSION Right 12/30/2018   Procedure: SHOULDER ARTHROSCOPY WITH ROTATOR CUFF REPAIR AND SUBACROMIAL DECOMPRESSION, DISTAL CLAVICLE EXICISION,;  Surgeon: Cristy Bonner DASEN, MD;  Location: [facility];  Service: Orthopedics;  Laterality: Right;   SHOULDER ARTHROSCOPY WITH SUBACROMIAL DECOMPRESSION, ROTATOR CUFF REPAIR AND BICEP TENDON REPAIR Left 06/19/2019   Procedure: LEFT SHOULDER ARTHROSCOPY, EXTENTSIVE DEBRIDEMENT, DISTAL CLAVICULECTOMY, SUBACROMIAL DECOMPRESSION, PARTIAL ACROMIOPLASTY WITH CORACOACROMIAL RELEASE, ROTATOR CUFFF REPAIR AND BICEP TENODESIS;  Surgeon: Cristy Bonner DASEN, MD;  Location: [facility];  Service: Orthopedics;  Laterality: Left;    Current Outpatient Medications  Medication Sig Dispense Refill   Accu-Chek Softclix Lancets lancets Use 3 (three) times daily as directed to check blood sugar 100 each 0   Blood Glucose Monitoring Suppl (BLOOD GLUCOSE MONITOR SYSTEM) w/Device KIT Use 3 (three) times daily. 1 kit 0   Continuous Glucose Receiver (FREESTYLE LIBRE 3 READER) DEVI Use 4 (four) times daily. 1 each 1   Continuous Glucose Sensor (FREESTYLE LIBRE 3 PLUS SENSOR) MISC Change sensor every 15 days. 6 each 3   cyclobenzaprine  (FLEXERIL ) 10 MG tablet Take 1 tablet (10 mg total) by mouth 3 (three) times daily as needed for muscle spasms. 30 tablet 0   empagliflozin  (JARDIANCE ) 25 MG TABS tablet Take 1 tablet (25 mg total) by mouth  daily. 90 tablet 3   esomeprazole  (NEXIUM ) 40 MG capsule TAKE 1 CAPSULE BY MOUTH EVERY MORNING BEFORE BREAKFAST 90 capsule 1   gabapentin  (NEURONTIN ) 300 MG capsule Take 1 capsule (300 mg total) by mouth 3 (three) times daily. 270 capsule 3   hydrocortisone  (ANUSOL -HC) 2.5 % rectal cream Place 1 Application rectally 2 (two) times daily. 30 g 0   insulin  glargine (LANTUS ) 100 UNIT/ML Solostar Pen Inject 36 Units into the skin daily. May titrate up to 42 units daily as directed by your provider. (Patient taking differently: Inject 36 Units into the skin daily. May titrate up to 42 units daily as directed by your provider.) 15 mL 5   insulin  lispro (HUMALOG ) 100 UNIT/ML KwikPen Inject 2 units + correction dose (per sliding scale) three times daily 15 minutes before each meal. (<150= 0 unit; BG 150-200= 2 unit; BG 201-250= 4 unit; BG 251-300= 6 unit; BG 301-350= 8 unit; BG 351-400= 10 unit; BG >400= 12 unit) 15 mL 3   Insulin  Pen Needle 32G X 4 MM MISC Use 3 (three) times daily. 100 each 0   lisinopril -hydrochlorothiazide  (ZESTORETIC ) 20-25 MG tablet Take 1 tablet by mouth daily. 90 tablet 3   oxyCODONE -acetaminophen  (PERCOCET) 10-325 MG tablet Take 1 tablet by mouth 4 (four) times daily as needed for pain.     simvastatin  (ZOCOR ) 10 MG tablet  TAKE 1 TABLET(10 MG) BY MOUTH DAILY 90 tablet 1   No current facility-administered medications for this visit.    Allergies as of 04/02/2024 - Review Complete 04/02/2024  Allergen Reaction Noted   Cymbalta [duloxetine hcl] Nausea And Vomiting and Other (See Comments) 02/24/2021   Shrimp [shellfish allergy] Nausea And Vomiting 12/31/2014   Valium  [diazepam ] Itching 05/10/2016    Family History  Problem Relation Age of Onset   Heart disease Mother    Diabetes Mother    Amblyopia Neg Hx    Blindness Neg Hx    Cataracts Neg Hx    Hypertension Neg Hx    Macular degeneration Neg Hx    Retinal detachment Neg Hx    Strabismus Neg Hx    Esophageal cancer Neg  Hx    Colon cancer Neg Hx    Pancreatic cancer Neg Hx    Ulcerative colitis Neg Hx    Inflammatory bowel disease Neg Hx    Liver disease Neg Hx    Rectal cancer Neg Hx    Stomach cancer Neg Hx     Social History   Tobacco Use   Smoking status: Former    Current packs/day: 0.00    Average packs/day: 0.3 packs/day for 25.0 years (6.3 ttl pk-yrs)    Types: Cigarettes    Start date: 04/27/1995    Quit date: 04/26/2020    Years since quitting: 3.9   Smokeless tobacco: Never  Vaping Use   Vaping status: Never Used  Substance Use Topics   Alcohol use: Yes    Alcohol/week: 4.0 standard drinks of alcohol    Types: 4 Cans of beer per week    Comment: twice a week   Drug use: No     Review of Systems:    Constitutional: No weight loss, fever, chills Cardiovascular: No chest pain Respiratory: No SOB Gastrointestinal: See HPI and otherwise negative   Physical Exam:  Vital signs: BP 110/62 (BP Location: Left Arm, Patient Position: Sitting, Cuff Size: Large)   Pulse (!) 120   Ht 6' (1.829 m) Comment: height measured without shoes  Wt 220 lb 8 oz (100 kg)   BMI 29.91 kg/m   Constitutional: Pleasant, overweight male in NAD, alert and cooperative Head:  Normocephalic and atraumatic.  Eyes: No scleral icterus.  Mouth: No oral lesions. Respiratory: Respirations even and unlabored. Lungs clear to auscultation bilaterally.  Cardiovascular:  Regular rate and rhythm. No murmurs. No peripheral edema. Gastrointestinal:  Soft, nondistended, nontender. No rebound or guarding. Normal bowel sounds. Rectal:  Small resolving external hemorrhoid, no anal fissures, nonbleeding internal hemorrhoids anoscopy, no stool in rectum.  Chaperone present for exam Neurologic:  Alert and oriented x4;  grossly normal neurologically.  Skin:   Dry and intact without significant lesions or rashes. Psychiatric: Oriented to person, place and time. Demonstrates good judgement and reason without abnormal affect  or behaviors.   RELEVANT LABS AND IMAGING: CBC    Component Value Date/Time   WBC 9.2 01/16/2024 1359   WBC 12.7 (H) 01/10/2024 0640   RBC 3.71 (L) 01/16/2024 1359   RBC 3.63 (L) 01/10/2024 0640   HGB 9.0 (L) 01/16/2024 1359   HCT 30.8 (L) 01/16/2024 1359   PLT 674 (H) 01/16/2024 1359   MCV 83 01/16/2024 1359   MCH 24.3 (L) 01/16/2024 1359   MCH 24.5 (L) 01/10/2024 0640   MCHC 29.2 (L) 01/16/2024 1359   MCHC 31.1 01/10/2024 0640   RDW 17.3 (H) 01/16/2024 1359   LYMPHSABS  1.0 01/02/2024 0451   LYMPHSABS 1.4 07/25/2017 1537   MONOABS 0.3 01/02/2024 0451   EOSABS 0.0 01/02/2024 0451   EOSABS 0.2 07/25/2017 1537   BASOSABS 0.0 01/02/2024 0451   BASOSABS 0.0 07/25/2017 1537    CMP     Component Value Date/Time   NA 139 01/10/2024 0641   NA 131 (L) 10/15/2023 1501   K 4.1 01/10/2024 0641   CL 110 01/10/2024 0641   CO2 19 (L) 01/10/2024 0641   GLUCOSE 138 (H) 01/10/2024 0641   BUN 8 01/10/2024 0641   BUN 11 10/15/2023 1501   CREATININE 0.67 01/10/2024 0641   CREATININE 0.86 01/15/2017 1501   CALCIUM  8.0 (L) 01/10/2024 0641   PROT 6.1 (L) 01/01/2024 2043   PROT 6.7 10/15/2023 1501   ALBUMIN 2.2 (L) 01/10/2024 0641   ALBUMIN 4.3 10/15/2023 1501   AST 39 01/01/2024 2043   ALT 23 01/01/2024 2043   ALKPHOS 129 (H) 01/01/2024 2043   BILITOT 0.7 01/01/2024 2043   BILITOT <0.2 10/15/2023 1501   GFRNONAA >60 01/10/2024 0641   GFRNONAA >89 01/15/2017 1501   GFRAA 105 07/26/2020 1117   GFRAA >89 01/15/2017 1501   Echocardiogram 01/02/2024 1. Left ventricular ejection fraction, by estimation, is 60 to 65% . The left ventricle has normal function. The left ventricle has no regional wall motion abnormalities. Left ventricular diastolic parameters were normal.  2. Right ventricular systolic function is moderately reduced. The right ventricular size is mildly enlarged. There is normal pulmonary artery systolic pressure.  3. The mitral valve is normal in structure. No evidence of  mitral valve regurgitation. No evidence of mitral stenosis. 4. The aortic valve is normal in structure. Aortic valve regurgitation is not visualized. No aortic stenosis is present.  5. The inferior vena cava is normal in size with greater than 50% respiratory variability, suggesting right atrial pressure of 3 mmHg.  Assessment/Plan:   Hemorrhoids Rectal bleeding Patient seen today for evaluation of hemorrhoids.  Last colonoscopy 03/28/2023 identified internal hemorrhoids.  Seen for office follow-up 04/20/2023 and at that time hemorrhoids improved with topical Anusol  cream.  States that in the last 2 to 3 months he has had a recurrence of symptoms, with protrusion of hemorrhoids, some intermittent bleeding, discomfort.  Has used topical hydrocortisone  with some relief.  On exam today has nonbleeding internal hemorrhoids.  He is interested in banding procedure.  - Schedule hemorrhoid banding - May continue intermittent use of topical hydrocortisone  as needed for recurrence of symptoms - Encouraged high-fiber diet, increase water  intake  Anemia Patient hospitalized in July for DKA.  Was found to have Salmonella, EPEC on GI pathogen panel and was treated with IV Rocephin .  States his diarrhea resolved.  He is now having regular bowel movements.  Did have anemia during that admission, and has not had repeat CBC since early August.  Anemia possibly related to GI infection?  Will repeat labs today.  Up-to-date on colonoscopy.  If evidence of persistent anemia, can pursue further workup.  - Labs today: CBC, BMP   Camie Furbish, PA-C [facility] 04/02/2024, 10:57 AM  Patient Care Team: [name], NP as PCP - General (Pulmonary Disease) Lavona Agent, MD as PCP - Cardiology (Cardiology) Brinda Lorain SQUIBB, [facility] (Pharmacist)

## 2024-04-03 NOTE — Progress Notes (Signed)
 Attending Physician's Attestation   I have reviewed the chart.   I agree with the Advanced Practitioner's note, impression, and recommendations with any updates as below. Appreciate my partners being available for hemorrhoidal banding.   Aloha Finner, MD [facility] Advanced Endoscopy Office # [phone]

## 2024-04-14 ENCOUNTER — Other Ambulatory Visit: Payer: Self-pay

## 2024-04-14 DIAGNOSIS — R202 Paresthesia of skin: Secondary | ICD-10-CM

## 2024-04-17 ENCOUNTER — Ambulatory Visit (INDEPENDENT_AMBULATORY_CARE_PROVIDER_SITE_OTHER): Admitting: Neurology

## 2024-04-17 ENCOUNTER — Other Ambulatory Visit: Payer: Self-pay | Admitting: Nurse Practitioner

## 2024-04-17 DIAGNOSIS — M48062 Spinal stenosis, lumbar region with neurogenic claudication: Secondary | ICD-10-CM

## 2024-04-17 DIAGNOSIS — R202 Paresthesia of skin: Secondary | ICD-10-CM

## 2024-04-17 DIAGNOSIS — G5622 Lesion of ulnar nerve, left upper limb: Secondary | ICD-10-CM

## 2024-04-17 MED ORDER — CYCLOBENZAPRINE HCL 10 MG PO TABS
10.0000 mg | ORAL_TABLET | Freq: Three times a day (TID) | ORAL | 0 refills | Status: AC | PRN
Start: 2024-04-17 — End: ?

## 2024-04-17 NOTE — Telephone Encounter (Signed)
 Copied from CRM [id number]. Topic: Clinical - Medication Refill >> Apr 17, 2024 10:59 AM Riley Clarke wrote: Medication: cyclobenzaprine  (FLEXERIL ) 10 MG tablet [[id number]]  Has the patient contacted their pharmacy? Yes, zero refills asked patient to contact the provider.  (Agent: If no, request that the patient contact the pharmacy for the refill. If patient does not wish to contact the pharmacy document the reason why and proceed with request.) (Agent: If yes, when and what did the pharmacy advise?)  This is the patient's preferred pharmacy:  [facility] [id number], [place] - [address] [place] [address] Phone: [phone] Fax: [phone]  Is this the correct pharmacy for this prescription? Yes If no, delete pharmacy and type the correct one.   Has the prescription been filled recently? No  Is the patient out of the medication? Yes  Has the patient been seen for an appointment in the last year OR does the patient have an upcoming appointment? Yes  Can we respond through MyChart? Yes  Agent: Please be advised that Rx refills may take up to 3 business days. We ask that you follow-up with your pharmacy.

## 2024-04-17 NOTE — Procedures (Signed)
 [facility]  [address] [place], [address]  [place], [place] [address] Tel: [phone] Fax: [phone] Test Date:  04/17/2024  Patient: Riley Clarke DOB: 08/05/1955 Physician: Tonita Blanch, DO  Sex: Male Height: 6' 0 Ref Phys: Gerard Beck, NP  ID#: [id number]   Technician:    History: This is a 68 year old man with history of cervical surgery referred for evaluation of left hand paresthesias.  NCV & EMG Findings: Extensive electrodiagnostic testing of the left upper extremity and additional studies of the right shows:  Left median sensory response shows prolonged latency (4.0 ms) and reduced amplitude (8.6 V).  Left ulnar sensory response shows prolonged latency (L3.3 ms) and borderline normal amplitude.  Right median, right ulnar, and bilateral radial sensory responses are within normal limits. Left median motor response is within normal limits.  Left ulnar motor response shows slowed conduction velocity across the elbow (A Elbow-B Elbow, 32 m/s).   Chronic motor axon loss changes are seen affecting the ulnar innervated muscles, without accompanying active denervation.  Impression: Left ulnar neuropathy with slowing across the elbow, demyelinating, moderate. Left median neuropathy at or distal to the wrist, consistent with a clinical diagnosis of carpal tunnel syndrome.  Overall, these findings are moderate in degree electrically. As compared to prior study on 02/04/2020, there has been mild progression of both carpal tunnel syndrome and ulnar neuropathy.   ___________________________ Tonita Blanch, DO    Nerve Conduction Studies   Stim Site NR Peak (ms) Norm Peak (ms) O-P Amp (V) Norm O-P Amp  Left Median Anti Sensory (2nd Digit)  32 C  Wrist    *4.0 <3.8 *8.6 >10  Right Median Anti Sensory (2nd Digit)  32 C  Wrist    3.2 <3.8 20.4 >10  Left Radial Anti Sensory (Base 1st Digit)  32 C  Wrist    2.1 <2.8 15.5 >10  Right Radial Anti Sensory (Base 1st  Digit)  32 C  Wrist    2.3 <2.8 14.9 >10  Left Ulnar Anti Sensory (5th Digit)  32 C  Wrist    *3.3 <3.2 5.3 >5  Right Ulnar Anti Sensory (5th Digit)  32 C  Wrist    3.1 <3.2 8.7 >5     Stim Site NR Onset (ms) Norm Onset (ms) O-P Amp (mV) Norm O-P Amp Site1 Site2 Delta-0 (ms) Dist (cm) Vel (m/s) Norm Vel (m/s)  Left Median Motor (Abd Poll Brev)  32 C  Wrist    3.4 <4.0 10.5 >5 Elbow Wrist 6.8 36.0 53 >50  Elbow    10.2  9.4         Left Ulnar Motor (Abd Dig Minimi)  32 C  Wrist    2.7 <3.1 7.5 >7 B Elbow Wrist 4.4 25.0 57 >50  B Elbow    7.1  6.7  A Elbow B Elbow 3.1 10.0 *32 >50  A Elbow    10.2  6.3          Electromyography   Side Muscle Ins.Act Fibs Fasc Recrt Amp Dur Poly Activation Comment  Left 1stDorInt Nml Nml Nml *1- *1+ *1+ *1+ Nml N/A  Left Abd Poll Brev Nml Nml Nml Nml Nml Nml Nml Nml N/A  Left PronatorTeres Nml Nml Nml Nml Nml Nml Nml Nml N/A  Left Biceps Nml Nml Nml Nml Nml Nml Nml Nml N/A  Left Triceps Nml Nml Nml Nml Nml Nml Nml Nml N/A  Left Deltoid Nml Nml Nml Nml Nml Nml Nml Nml N/A  Left Abd Dig Min Nml Nml Nml *1- *1+ *1+ *1+ Nml N/A  Left FlexCarpiUln Nml Nml Nml *1- *1+ *1+ *1+ Nml N/A      Waveforms:

## 2024-04-17 NOTE — Telephone Encounter (Signed)
 Please advise [facility]

## 2024-04-30 ENCOUNTER — Ambulatory Visit: Payer: Self-pay | Admitting: Nurse Practitioner

## 2024-04-30 ENCOUNTER — Encounter: Payer: Self-pay | Admitting: Nurse Practitioner

## 2024-04-30 VITALS — BP 131/68 | HR 104 | Wt 226.0 lb

## 2024-04-30 DIAGNOSIS — E119 Type 2 diabetes mellitus without complications: Secondary | ICD-10-CM

## 2024-04-30 LAB — POCT GLYCOSYLATED HEMOGLOBIN (HGB A1C): Hemoglobin A1C: 10.1 % — AB (ref 4.0–5.6)

## 2024-04-30 NOTE — Progress Notes (Signed)
 Subjective   Patient ID: Riley Clarke, male    DOB: May 30, 1956, 68 y.o.   MRN: [id number]  Chief Complaint  Patient presents with   Diabetes   Labs Only    Basic Metabolic Panel     Referring provider: Oley Riley RAMAN, NP  Riley Clarke is a 68 y.o. male with Past Medical History: No date: Arthritis No date: Chronic back pain     Comment:  on chronic opioids No date: Diabetes mellitus without complication (HCC) No date: GERD (gastroesophageal reflux disease) No date: HLD (hyperlipidemia) No date: Hypertension     Comment:  no meds No date: Vitamin D  deficiency   HPI  Patient presents today for follow-up visit.  Overall he has been doing well other than back pain.  He states that he is compliant with medications.  A1c in office today is 10.1.  Referral has been placed to pharmacy for medication management for diabetes. Denies f/c/s, n/v/d, hemoptysis, PND, leg swelling. Denies chest pain or edema.    Allergies  Allergen Reactions   Cymbalta [Duloxetine Hcl] Nausea And Vomiting and Other (See Comments)    Dizziness Headaches    Shrimp [Shellfish Allergy] Nausea And Vomiting   Valium  [Diazepam ] Itching    Immunization History  Administered Date(s) Administered   Fluad Quad(high Dose 65+) 04/16/2023   Influenza,inj,Quad PF,6+ Mos 04/01/2015, 02/04/2016, 02/19/2017, 04/02/2018, 02/02/2021   Moderna SARS-COV2 Booster Vaccination 05/04/2020   Moderna Sars-Covid-2 Vaccination 08/27/2019, 09/24/2019   Pneumococcal Conjugate-13 04/01/2015, 10/11/2022   Pneumococcal Polysaccharide-23 05/08/2016   Tdap 09/06/2014, 04/01/2015    Tobacco History: Social History   Tobacco Use  Smoking Status Former   Current packs/day: 0.00   Average packs/day: 0.3 packs/day for 25.0 years (6.3 ttl pk-yrs)   Types: Cigarettes   Start date: 04/27/1995   Quit date: 04/26/2020   Years since quitting: 4.0  Smokeless Tobacco Never   Counseling given: Not Answered   Outpatient  Encounter Medications as of 04/30/2024  Medication Sig   Accu-Chek Softclix Lancets lancets Use 3 (three) times daily as directed to check blood sugar   Blood Glucose Monitoring Suppl (BLOOD GLUCOSE MONITOR SYSTEM) w/Device KIT Use 3 (three) times daily.   Continuous Glucose Receiver (FREESTYLE LIBRE 3 READER) DEVI Use 4 (four) times daily.   Continuous Glucose Sensor (FREESTYLE LIBRE 3 PLUS SENSOR) MISC Change sensor every 15 days.   cyclobenzaprine  (FLEXERIL ) 10 MG tablet Take 1 tablet (10 mg total) by mouth 3 (three) times daily as needed for muscle spasms.   empagliflozin  (JARDIANCE ) 25 MG TABS tablet Take 1 tablet (25 mg total) by mouth daily.   esomeprazole  (NEXIUM ) 40 MG capsule TAKE 1 CAPSULE BY MOUTH EVERY MORNING BEFORE BREAKFAST   gabapentin  (NEURONTIN ) 300 MG capsule Take 1 capsule (300 mg total) by mouth 3 (three) times daily.   hydrocortisone  (ANUSOL -HC) 2.5 % rectal cream Place 1 Application rectally 2 (two) times daily.   insulin  glargine (LANTUS ) 100 UNIT/ML Solostar Pen Inject 36 Units into the skin daily. May titrate up to 42 units daily as directed by your provider. (Patient taking differently: Inject 36 Units into the skin daily. May titrate up to 42 units daily as directed by your provider.)   insulin  lispro (HUMALOG ) 100 UNIT/ML KwikPen Inject 2 units + correction dose (per sliding scale) three times daily 15 minutes before each meal. (<150= 0 unit; BG 150-200= 2 unit; BG 201-250= 4 unit; BG 251-300= 6 unit; BG 301-350= 8 unit; BG 351-400= 10 unit; BG >400=  12 unit)   Insulin  Pen Needle 32G X 4 MM MISC Use 3 (three) times daily.   lisinopril -hydrochlorothiazide  (ZESTORETIC ) 20-25 MG tablet Take 1 tablet by mouth daily.   oxyCODONE -acetaminophen  (PERCOCET) 10-325 MG tablet Take 1 tablet by mouth 4 (four) times daily as needed for pain.   simvastatin  (ZOCOR ) 10 MG tablet TAKE 1 TABLET(10 MG) BY MOUTH DAILY   No facility-administered encounter medications on file as of  04/30/2024.    Review of Systems  Review of Systems  Constitutional: Negative.   HENT: Negative.    Cardiovascular: Negative.   Gastrointestinal: Negative.   Allergic/Immunologic: Negative.   Neurological: Negative.   Psychiatric/Behavioral: Negative.       Objective:   BP 131/68 (BP Location: Right Arm, Patient Position: Sitting, Cuff Size: Normal)   Pulse (!) 104   Wt 226 lb (102.5 kg)   SpO2 100%   BMI 30.65 kg/m   Wt Readings from Last 5 Encounters:  04/30/24 226 lb (102.5 kg)  04/02/24 220 lb 8 oz (100 kg)  03/25/24 222 lb 12.8 oz (101.1 kg)  02/13/24 217 lb (98.4 kg)  01/16/24 217 lb 3.2 oz (98.5 kg)     Physical Exam Vitals and nursing note reviewed.  Constitutional:      General: He is not in acute distress.    Appearance: He is well-developed.  Cardiovascular:     Rate and Rhythm: Normal rate and regular rhythm.  Pulmonary:     Effort: Pulmonary effort is normal.     Breath sounds: Normal breath sounds.  Skin:    General: Skin is warm and dry.  Neurological:     Mental Status: He is alert and oriented to person, place, and time.       Assessment & Plan:   Type 2 diabetes mellitus without complication, without long-term current use of insulin  (HCC) -     POCT glycosylated hemoglobin (Hb A1C) -     Basic metabolic panel with GFR     Return in about 3 months (around 07/31/2024).     Riley GORMAN Borer, NP 04/30/2024

## 2024-05-01 ENCOUNTER — Ambulatory Visit: Payer: Self-pay | Admitting: Nurse Practitioner

## 2024-05-01 ENCOUNTER — Other Ambulatory Visit: Payer: Self-pay | Admitting: Nurse Practitioner

## 2024-05-01 DIAGNOSIS — K219 Gastro-esophageal reflux disease without esophagitis: Secondary | ICD-10-CM

## 2024-05-01 DIAGNOSIS — E119 Type 2 diabetes mellitus without complications: Secondary | ICD-10-CM

## 2024-05-01 LAB — BASIC METABOLIC PANEL WITH GFR
BUN/Creatinine Ratio: 13 (ref 10–24)
BUN: 12 mg/dL (ref 8–27)
CO2: 21 mmol/L (ref 20–29)
Calcium: 9.1 mg/dL (ref 8.6–10.2)
Chloride: 100 mmol/L (ref 96–106)
Creatinine, Ser: 0.89 mg/dL (ref 0.76–1.27)
Glucose: 294 mg/dL — ABNORMAL HIGH (ref 70–99)
Potassium: 4.3 mmol/L (ref 3.5–5.2)
Sodium: 136 mmol/L (ref 134–144)
eGFR: 95 mL/min/1.73 (ref >59)

## 2024-05-01 NOTE — Telephone Encounter (Signed)
 Copied from CRM [id number]. Topic: Clinical - Medication Refill >> May 01, 2024  1:54 PM Avram G wrote: Medication: esomeprazole  (NEXIUM ) 40 MG capsule [[id number]]  Has the patient contacted their pharmacy? Yes (Agent: If no, request that the patient contact the pharmacy for the refill. If patient does not wish to contact the pharmacy document the reason why and proceed with request.) (Agent: If yes, when and what did the pharmacy advise?)  This is the patient's preferred pharmacy:  [facility] [id number] [place], [place] - [address] [place] [address] Phone: [phone] Fax: [phone]  Is this the correct pharmacy for this prescription? Yes If no, delete pharmacy and type the correct one.   Has the prescription been filled recently? No  Is the patient out of the medication? No  Has the patient been seen for an appointment in the last year OR does the patient have an upcoming appointment? Yes  Can we respond through MyChart? Yes  Agent: Please be advised that Rx refills may take up to 3 business days. We ask that you follow-up with your pharmacy.

## 2024-05-06 ENCOUNTER — Telehealth: Payer: Self-pay

## 2024-05-06 ENCOUNTER — Other Ambulatory Visit: Payer: Self-pay | Admitting: Neurosurgery

## 2024-05-06 ENCOUNTER — Other Ambulatory Visit: Payer: Self-pay | Admitting: Nurse Practitioner

## 2024-05-06 ENCOUNTER — Other Ambulatory Visit: Payer: Self-pay

## 2024-05-06 DIAGNOSIS — D649 Anemia, unspecified: Secondary | ICD-10-CM

## 2024-05-06 MED ORDER — ESOMEPRAZOLE MAGNESIUM 40 MG PO CPDR: DELAYED_RELEASE_CAPSULE | ORAL | 1 refills | Status: AC

## 2024-05-06 NOTE — Telephone Encounter (Signed)
 Copied from CRM [id number]. Topic: Clinical - Medication Refill >> May 06, 2024  8:29 AM Amy B wrote: Medication:  gabapentin  (NEURONTIN ) 300 MG capsule   Has the patient contacted their pharmacy? Yes (Agent: If no, request that the patient contact the pharmacy for the refill. If patient does not wish to contact the pharmacy document the reason why and proceed with request.) (Agent: If yes, when and what did the pharmacy advise?)  This is the patient's preferred pharmacy:  [facility] [id number] [place], [place] - [address] [place] [address] Phone: [phone] Fax: [phone]  Is this the correct pharmacy for this prescription? Yes If no, delete pharmacy and type the correct one.   Has the prescription been filled recently? No  Is the patient out of the medication? Yes  Has the patient been seen for an appointment in the last year OR does the patient have an upcoming appointment? Yes  Can we respond through MyChart? Yes  Agent: Please be advised that Rx refills may take up to 3 business days. We ask that you follow-up with your pharmacy.

## 2024-05-06 NOTE — Telephone Encounter (Signed)
 The pt returned call regarding lab results. He agrees to the iron infusions. Referral sent.   Pt to be called in 2 months for repeat labs.   He states he is having surgery soon and will call back to make EGD appt as soon as he is able.  He will start OTC iron and B12

## 2024-05-06 NOTE — Pre-Procedure Instructions (Signed)
 Surgical Instructions   Your procedure is scheduled on May 12, 2024. Report to [facility] Main Entrance A at 8:15 A.M., then check in with the Admitting office. Any questions or running late day of surgery: call [phone]  Questions prior to your surgery date: call [phone], Monday-Friday, 8am-4pm. If you experience any cold or flu symptoms such as cough, fever, chills, shortness of breath, etc. between now and your scheduled surgery, please notify us  at the above number.     Remember:  Do not eat after midnight the night before your surgery   You may drink clear liquids until 7:15AM the morning of your surgery.   Clear liquids allowed are: Water , Non-Citrus Juices (without pulp), Carbonated Beverages, Clear Tea (no milk, honey, etc.), Black Coffee Only (NO MILK, CREAM OR POWDERED CREAMER of any kind), and Gatorade.    Take these medicines the morning of surgery with A SIP OF WATER : esomeprazole  (NEXIUM )  gabapentin  (NEURONTIN )  simvastatin  (ZOCOR )   May take these medicines IF NEEDED: cyclobenzaprine  (FLEXERIL )  oxyCODONE -acetaminophen  (PERCOCET)   One week prior to surgery, STOP taking any Aspirin (unless otherwise instructed by your surgeon) Aleve, Naproxen, Ibuprofen, Motrin, Advil, Goody's, BC's, all herbal medications, fish oil, and non-prescription vitamins.      WHAT DO I DO ABOUT MY DIABETES MEDICATION?   Do not take oral diabetes medicines (pills) the morning of surgery.  DO NOT TAKE empagliflozin  (JARDIANCE ) for 72 hours prior to surgery. Do not take after 05/08/24  THE NIGHT BEFORE SURGERY, take ___________ units of ___________insulin.       THE MORNING OF SURGERY, take _____________ units of __________insulin.  DO NOT take bedtime dose of insulin  lispro (Humalog ) insulin  the night before surgery. You may take your normal meal time doses the day before surgery.  The day of surgery: If your CBG is greater than 220 mg/dL, you may take  of your  sliding scale (correction) dose of insulin .   HOW TO MANAGE YOUR DIABETES BEFORE AND AFTER SURGERY  Why is it important to control my blood sugar before and after surgery? Improving blood sugar levels before and after surgery helps healing and can limit problems. A way of improving blood sugar control is eating a healthy diet by:  Eating less sugar and carbohydrates  Increasing activity/exercise  Talking with your doctor about reaching your blood sugar goals High blood sugars (greater than 180 mg/dL) can raise your risk of infections and slow your recovery, so you will need to focus on controlling your diabetes during the weeks before surgery. Make sure that the doctor who takes care of your diabetes knows about your planned surgery including the date and location.  How do I manage my blood sugar before surgery? Check your blood sugar at least 4 times a day, starting 2 days before surgery, to make sure that the level is not too high or low.  Check your blood sugar the morning of your surgery when you wake up and every 2 hours until you get to the Short Stay unit.  If your blood sugar is less than 70 mg/dL, you will need to treat for low blood sugar: Do not take insulin . Treat a low blood sugar (less than 70 mg/dL) with  cup of clear juice (cranberry or apple), 4 glucose tablets, OR glucose gel. Recheck blood sugar in 15 minutes after treatment (to make sure it is greater than 70 mg/dL). If your blood sugar is not greater than 70 mg/dL on recheck, call [phone] for further  instructions. Report your blood sugar to the short stay nurse when you get to Short Stay.  If you are admitted to the hospital after surgery: Your blood sugar will be checked by the staff and you will probably be given insulin  after surgery (instead of oral diabetes medicines) to make sure you have good blood sugar levels. The goal for blood sugar control after surgery is 80-180 mg/dL.                 Do NOT  Smoke (Tobacco/Vaping) for 24 hours prior to your procedure.  If you use a CPAP at night, you may bring your mask/headgear for your overnight stay.   You will be asked to remove any contacts, glasses, piercing's, hearing aid's, dentures/partials prior to surgery. Please bring cases for these items if needed.    Patients discharged the day of surgery will not be allowed to drive home, and someone needs to stay with them for 24 hours.  SURGICAL WAITING ROOM VISITATION Patients may have no more than 2 support people in the waiting area - these visitors may rotate.   Pre-op nurse will coordinate an appropriate time for 1 ADULT support person, who may not rotate, to accompany patient in pre-op.  Children under the age of 36 must have an adult with them who is not the patient and must remain in the main waiting area with an adult.  If the patient needs to stay at the hospital during part of their recovery, the visitor guidelines for inpatient rooms apply.  Please refer to the [facility] website for the visitor guidelines for any additional information.   If you received a COVID test during your pre-op visit  it is requested that you wear a mask when out in public, stay away from anyone that may not be feeling well and notify your surgeon if you develop symptoms. If you have been in contact with anyone that has tested positive in the last 10 days please notify you surgeon.      Pre-operative CHG Bathing Instructions   You can play a key role in reducing the risk of infection after surgery. Your skin needs to be as free of germs as possible. You can reduce the number of germs on your skin by washing with CHG (chlorhexidine  gluconate) soap before surgery. CHG is an antiseptic soap that kills germs and continues to kill germs even after washing.   DO NOT use if you have an allergy to chlorhexidine /CHG or antibacterial soaps. If your skin becomes reddened or irritated, stop using the CHG and notify  one of our RNs at [phone].              TAKE A SHOWER THE NIGHT BEFORE SURGERY   Please keep in mind the following:  DO NOT shave, including legs and underarms, 48 hours prior to surgery.   You may shave your face before/day of surgery.  Place clean sheets on your bed the night before surgery Use a clean washcloth (not used since being washed) for shower. DO NOT sleep with pet's night before surgery.  CHG Shower Instructions:  Wash your face and private area with normal soap. If you choose to wash your hair, wash first with your normal shampoo.  After you use shampoo/soap, rinse your hair and body thoroughly to remove shampoo/soap residue.  Turn the water  OFF and apply half the bottle of CHG soap to a CLEAN washcloth.  Apply CHG soap ONLY FROM YOUR NECK DOWN TO YOUR  TOES (washing for 3-5 minutes)  DO NOT use CHG soap on face, private areas, open wounds, or sores.  Pay special attention to the area where your surgery is being performed.  If you are having back surgery, having someone wash your back for you may be helpful. Wait 2 minutes after CHG soap is applied, then you may rinse off the CHG soap.  Pat dry with a clean towel  Put on clean pajamas    Additional instructions for the day of surgery: If you choose, you may shower the morning of surgery with an antibacterial soap.  DO NOT APPLY any lotions, deodorants, cologne, or perfumes.   Do not wear jewelry or makeup Do not wear nail polish, gel polish, artificial nails, or any other type of covering on natural nails (fingers and toes) Do not bring valuables to the hospital. [facility] is not responsible for valuables/personal belongings. Put on clean/comfortable clothes.  Please brush your teeth.  Ask your nurse before applying any prescription medications to the skin.

## 2024-05-06 NOTE — Telephone Encounter (Signed)
 Please advise [facility]

## 2024-05-07 ENCOUNTER — Encounter (HOSPITAL_COMMUNITY): Payer: Self-pay

## 2024-05-07 ENCOUNTER — Other Ambulatory Visit: Payer: Self-pay

## 2024-05-07 ENCOUNTER — Encounter (HOSPITAL_COMMUNITY)
Admission: RE | Admit: 2024-05-07 | Discharge: 2024-05-07 | Disposition: A | Discharge status: Home / Self Care | Source: Ambulatory Visit | Attending: Neurosurgery | Admitting: Neurosurgery

## 2024-05-07 VITALS — BP 128/69 | HR 93 | Temp 98.6°F | Resp 18 | Ht 72.0 in | Wt 226.5 lb

## 2024-05-07 DIAGNOSIS — Z87891 Personal history of nicotine dependence: Secondary | ICD-10-CM | POA: Diagnosis not present

## 2024-05-07 DIAGNOSIS — Z79899 Other long term (current) drug therapy: Secondary | ICD-10-CM | POA: Insufficient documentation

## 2024-05-07 DIAGNOSIS — E119 Type 2 diabetes mellitus without complications: Secondary | ICD-10-CM | POA: Insufficient documentation

## 2024-05-07 DIAGNOSIS — K219 Gastro-esophageal reflux disease without esophagitis: Secondary | ICD-10-CM | POA: Diagnosis not present

## 2024-05-07 DIAGNOSIS — I10 Essential (primary) hypertension: Secondary | ICD-10-CM | POA: Diagnosis not present

## 2024-05-07 DIAGNOSIS — Z981 Arthrodesis status: Secondary | ICD-10-CM | POA: Insufficient documentation

## 2024-05-07 DIAGNOSIS — Z794 Long term (current) use of insulin: Secondary | ICD-10-CM | POA: Insufficient documentation

## 2024-05-07 DIAGNOSIS — Z01818 Encounter for other preprocedural examination: Secondary | ICD-10-CM | POA: Diagnosis present

## 2024-05-07 DIAGNOSIS — Z01812 Encounter for preprocedural laboratory examination: Secondary | ICD-10-CM | POA: Insufficient documentation

## 2024-05-07 LAB — CBC
HCT: 35.1 % — ABNORMAL LOW (ref 39.0–52.0)
Hemoglobin: 10.4 g/dL — ABNORMAL LOW (ref 13.0–17.0)
MCH: 21.3 pg — ABNORMAL LOW (ref 26.0–34.0)
MCHC: 29.6 g/dL — ABNORMAL LOW (ref 30.0–36.0)
MCV: 71.9 fL — ABNORMAL LOW (ref 80.0–100.0)
Platelets: 387 K/uL (ref 150–400)
RBC: 4.88 MIL/uL (ref 4.22–5.81)
RDW: 16.7 % — ABNORMAL HIGH (ref 11.5–15.5)
WBC: 8.1 K/uL (ref 4.0–10.5)
nRBC: 0 % (ref 0.0–0.2)

## 2024-05-07 LAB — SURGICAL PCR SCREEN
MRSA, PCR: NEGATIVE
Staphylococcus aureus: POSITIVE — AB

## 2024-05-07 LAB — GLUCOSE, CAPILLARY: Glucose-Capillary: 159 mg/dL — ABNORMAL HIGH (ref 70–99)

## 2024-05-07 NOTE — Progress Notes (Signed)
 Anesthesia Chart Review: Same day workup  68 yo male with pertinent hx including former smoker (quit 2021), IDDM2, HTN, GERD on PPI, s/p C3-4 and C5-6 ACDF, s/p L4-S1 lumbar fusion.  He was admitted 7/22-7/31/25 for septic shock due to salmonella/e.coli colitis with diarrhea, DKA, hyponatremia, and AKI. Required CRRT 7/22-7/25. He also developed afib with RVR which converted with amiodarone  drip. Echo showed LVEF 60-65%, no WMA, RV mildly enlarged with moderately reduced systolic function. At discharge he was recommended to f/u with PCP for ongoing management.   Seen in followup by PCP Bascom Borer, FNP 01/16/24. Reported doing well other than back pain. A1c 11.7 at that time. 3 month f/u recommended. Seen again 04/30/24, A1c 10.1 at that time. He was referred to pharmacy for diabetes medication management.   BMP 04/30/24 reviewed, glucose elevated 294, otherwise WNL.  Labs from preadmission testing clinic reviewed, mild anemia with Hgb 10.4 CBG 159.  History reviewed with anesthesiologist Dr. Lucious. Advised ok to proceed as planned barring acute status change and blood sugar reasonably well controlled on DOS. Will need repeat EKG on DOS.  TTE 01/02/24:  1. Left ventricular ejection fraction, by estimation, is 60 to 65%. The  left ventricle has normal function. The left ventricle has no regional  wall motion abnormalities. Left ventricular diastolic parameters were  normal.   2. Right ventricular systolic function is moderately reduced. The right  ventricular size is mildly enlarged. There is normal pulmonary artery  systolic pressure.   3. The mitral valve is normal in structure. No evidence of mitral valve  regurgitation. No evidence of mitral stenosis.   4. The aortic valve is normal in structure. Aortic valve regurgitation is  not visualized. No aortic stenosis is present.   5. The inferior vena cava is normal in size with greater than 50%  respiratory variability, suggesting right  atrial pressure of 3 mmHg.     Lynwood Geofm RIGGERS [facility] Short Stay Center/Anesthesiology Phone [phone] 05/07/2024 1:58 PM

## 2024-05-07 NOTE — Anesthesia Preprocedure Evaluation (Addendum)
 Anesthesia Evaluation  Patient identified by MRN, date of birth, ID band Patient awake    Reviewed: Allergy & Precautions, H&P , NPO status , Patient's Chart, lab work & pertinent test results  Airway Mallampati: II   Neck ROM: full    Dental   Pulmonary former smoker   breath sounds clear to auscultation       Cardiovascular hypertension,  Rhythm:regular Rate:Normal     Neuro/Psych  Neuromuscular disease    GI/Hepatic ,GERD  ,,  Endo/Other  diabetes, Type 2    Renal/GU      Musculoskeletal  (+) Arthritis ,    Abdominal   Peds  Hematology   Anesthesia Other Findings   Reproductive/Obstetrics                              Anesthesia Physical Anesthesia Plan  ASA: 2  Anesthesia Plan: General   Post-op Pain Management:    Induction: Intravenous  PONV Risk Score and Plan: 2 and Ondansetron , Dexamethasone , Midazolam  and Treatment may vary due to age or medical condition  Airway Management Planned: LMA  Additional Equipment:   Intra-op Plan:   Post-operative Plan: Extubation in OR  Informed Consent: I have reviewed the patients History and Physical, chart, labs and discussed the procedure including the risks, benefits and alternatives for the proposed anesthesia with the patient or authorized representative who has indicated his/her understanding and acceptance.     Dental advisory given  Plan Discussed with: CRNA, Anesthesiologist and Surgeon  Anesthesia Plan Comments: (PAT note by Lynwood Hope, PA-C: 68 yo male with pertinent hx including former smoker (quit 2021), IDDM2, HTN, GERD on PPI, s/p C3-4 and C5-6 ACDF, s/p L4-S1 lumbar fusion.  He was admitted 7/22-7/31/25 for septic shock due to salmonella/e.coli colitis with diarrhea, DKA, hyponatremia, and AKI. Required CRRT 7/22-7/25. He also developed afib with RVR which converted with amiodarone  drip. Echo showed LVEF 60-65%, no  WMA, RV mildly enlarged with moderately reduced systolic function. At discharge he was recommended to f/u with PCP for ongoing management.   Seen in followup by PCP Bascom Borer, FNP 01/16/24. Reported doing well other than back pain. A1c 11.7 at that time. 3 month f/u recommended. Seen again 04/30/24, A1c 10.1 at that time. He was referred to pharmacy for diabetes medication management.   BMP 04/30/24 reviewed, glucose elevated 294, otherwise WNL.  Labs from preadmission testing clinic reviewed, mild anemia with Hgb 10.4 CBG 159.  History reviewed with anesthesiologist Dr. Lucious. Advised ok to proceed as planned barring acute status change and blood sugar reasonably well controlled on DOS. Will need repeat EKG on DOS.  TTE 01/02/24: 1. Left ventricular ejection fraction, by estimation, is 60 to 65%. The  left ventricle has normal function. The left ventricle has no regional  wall motion abnormalities. Left ventricular diastolic parameters were  normal.  2. Right ventricular systolic function is moderately reduced. The right  ventricular size is mildly enlarged. There is normal pulmonary artery  systolic pressure.  3. The mitral valve is normal in structure. No evidence of mitral valve  regurgitation. No evidence of mitral stenosis.  4. The aortic valve is normal in structure. Aortic valve regurgitation is  not visualized. No aortic stenosis is present.  5. The inferior vena cava is normal in size with greater than 50%  respiratory variability, suggesting right atrial pressure of 3 mmHg.    )  Anesthesia Quick Evaluation

## 2024-05-07 NOTE — Progress Notes (Signed)
 PCP - Bascom Borer, NP Cardiologist - denies  PPM/ICD - denies Device Orders -  Rep Notified -   Chest x-ray - 01/01/24 EKG - 01/01/24 Stress Test - 08/24/12 ECHO - 01/02/24 Cardiac Cath - denies  Sleep Study - denies CPAP -   Fasting Blood Sugar - 124 Checks Blood Sugar 1-2 times a day  Last dose of GLP1 agonist-  na GLP1 instructions: na  Blood Thinner Instructions:na Aspirin Instructions:na  ERAS Protcol -NPO PRE-SURGERY Ensure or G2-   COVID TEST- yes   Anesthesia review: yes- A1C10.1 on 04/30/24  Patient denies shortness of breath, fever, cough and chest pain at PAT appointment   All instructions explained to the patient, with a verbal understanding of the material. Patient agrees to go over the instructions while at home for a better understanding. Patient also instructed to self quarantine after being tested for COVID-19. The opportunity to ask questions was provided.

## 2024-05-07 NOTE — Pre-Procedure Instructions (Signed)
 Surgical Instructions   Your procedure is scheduled on May 12, 2024. Report to [facility] Main Entrance A at 8:15 A.M., then check in with the Admitting office. Any questions or running late day of surgery: call [phone]  Questions prior to your surgery date: call [phone], Monday-Friday, 8am-4pm. If you experience any cold or flu symptoms such as cough, fever, chills, shortness of breath, etc. between now and your scheduled surgery, please notify us  at the above number.     Remember:  Do not eat or drink anything after midnight the night before your surgery.   Take these medicines the morning of surgery with A SIP OF WATER : esomeprazole  (NEXIUM )  gabapentin  (NEURONTIN )  simvastatin  (ZOCOR )   May take these medicines IF NEEDED: cyclobenzaprine  (FLEXERIL )  oxyCODONE -acetaminophen  (PERCOCET)   One week prior to surgery, STOP taking any Aspirin (unless otherwise instructed by your surgeon) Aleve, Naproxen, Ibuprofen, Motrin, Advil, Goody's, BC's, all herbal medications, fish oil, and non-prescription vitamins.      WHAT DO I DO ABOUT MY DIABETES MEDICATION?   Do not take oral diabetes medicines (pills) the morning of surgery.  DO NOT TAKE empagliflozin  (JARDIANCE ) for 72 hours prior to surgery. Do not take after 05/08/24   THE MORNING OF SURGERY, take 15 units of Lantus  insulin .  DO NOT take bedtime dose of insulin  lispro (Humalog ) insulin  the night before surgery. You may take your normal meal time doses the day before surgery.  The day of surgery: If your CBG is greater than 220 mg/dL, you may take  of your sliding scale (correction) dose of insulin .   HOW TO MANAGE YOUR DIABETES BEFORE AND AFTER SURGERY  Why is it important to control my blood sugar before and after surgery? Improving blood sugar levels before and after surgery helps healing and can limit problems. A way of improving blood sugar control is eating a healthy diet by:  Eating less sugar and  carbohydrates  Increasing activity/exercise  Talking with your doctor about reaching your blood sugar goals High blood sugars (greater than 180 mg/dL) can raise your risk of infections and slow your recovery, so you will need to focus on controlling your diabetes during the weeks before surgery. Make sure that the doctor who takes care of your diabetes knows about your planned surgery including the date and location.  How do I manage my blood sugar before surgery? Check your blood sugar at least 4 times a day, starting 2 days before surgery, to make sure that the level is not too high or low.  Check your blood sugar the morning of your surgery when you wake up and every 2 hours until you get to the Short Stay unit.  If your blood sugar is less than 70 mg/dL, you will need to treat for low blood sugar: Do not take insulin . Treat a low blood sugar (less than 70 mg/dL) with  cup of clear juice (cranberry or apple), 4 glucose tablets, OR glucose gel. Recheck blood sugar in 15 minutes after treatment (to make sure it is greater than 70 mg/dL). If your blood sugar is not greater than 70 mg/dL on recheck, call [phone] for further instructions. Report your blood sugar to the short stay nurse when you get to Short Stay.  If you are admitted to the hospital after surgery: Your blood sugar will be checked by the staff and you will probably be given insulin  after surgery (instead of oral diabetes medicines) to make sure you have good blood sugar levels.  The goal for blood sugar control after surgery is 80-180 mg/dL.                 Do NOT Smoke (Tobacco/Vaping) for 24 hours prior to your procedure.  If you use a CPAP at night, you may bring your mask/headgear for your overnight stay.   You will be asked to remove any contacts, glasses, piercing's, hearing aid's, dentures/partials prior to surgery. Please bring cases for these items if needed.    Patients discharged the day of surgery will not  be allowed to drive home, and someone needs to stay with them for 24 hours.  SURGICAL WAITING ROOM VISITATION Patients may have no more than 2 support people in the waiting area - these visitors may rotate.   Pre-op nurse will coordinate an appropriate time for 1 ADULT support person, who may not rotate, to accompany patient in pre-op.  Children under the age of 86 must have an adult with them who is not the patient and must remain in the main waiting area with an adult.  If the patient needs to stay at the hospital during part of their recovery, the visitor guidelines for inpatient rooms apply.  Please refer to the [facility] website for the visitor guidelines for any additional information.   If you received a COVID test during your pre-op visit  it is requested that you wear a mask when out in public, stay away from anyone that may not be feeling well and notify your surgeon if you develop symptoms. If you have been in contact with anyone that has tested positive in the last 10 days please notify you surgeon.      Pre-operative CHG Bathing Instructions   You can play a key role in reducing the risk of infection after surgery. Your skin needs to be as free of germs as possible. You can reduce the number of germs on your skin by washing with CHG (chlorhexidine  gluconate) soap before surgery. CHG is an antiseptic soap that kills germs and continues to kill germs even after washing.   DO NOT use if you have an allergy to chlorhexidine /CHG or antibacterial soaps. If your skin becomes reddened or irritated, stop using the CHG and notify one of our RNs at [phone].              TAKE A SHOWER THE NIGHT BEFORE SURGERY   Please keep in mind the following:  DO NOT shave, including legs and underarms, 48 hours prior to surgery.   You may shave your face before/day of surgery.  Place clean sheets on your bed the night before surgery Use a clean washcloth (not used since being washed) for  shower. DO NOT sleep with pet's night before surgery.  CHG Shower Instructions:  Wash your face and private area with normal soap. If you choose to wash your hair, wash first with your normal shampoo.  After you use shampoo/soap, rinse your hair and body thoroughly to remove shampoo/soap residue.  Turn the water  OFF and apply half the bottle of CHG soap to a CLEAN washcloth.  Apply CHG soap ONLY FROM YOUR NECK DOWN TO YOUR TOES (washing for 3-5 minutes)  DO NOT use CHG soap on face, private areas, open wounds, or sores.  Pay special attention to the area where your surgery is being performed.  If you are having back surgery, having someone wash your back for you may be helpful. Wait 2 minutes after CHG soap is applied, then  you may rinse off the CHG soap.  Pat dry with a clean towel  Put on clean pajamas    Additional instructions for the day of surgery: If you choose, you may shower the morning of surgery with an antibacterial soap.  DO NOT APPLY any lotions, deodorants, cologne, or perfumes.   Do not wear jewelry or makeup Do not wear nail polish, gel polish, artificial nails, or any other type of covering on natural nails (fingers and toes) Do not bring valuables to the hospital. [facility] is not responsible for valuables/personal belongings. Put on clean/comfortable clothes.  Please brush your teeth.  Ask your nurse before applying any prescription medications to the skin.

## 2024-05-12 ENCOUNTER — Ambulatory Visit (HOSPITAL_COMMUNITY)

## 2024-05-12 ENCOUNTER — Encounter (HOSPITAL_COMMUNITY): Admission: RE | Disposition: A | Payer: Self-pay | Source: Home / Self Care | Attending: Neurological Surgery

## 2024-05-12 ENCOUNTER — Ambulatory Visit (HOSPITAL_COMMUNITY)
Admission: RE | Admit: 2024-05-12 | Discharge: 2024-05-12 | Disposition: A | Attending: Neurological Surgery | Admitting: Neurological Surgery

## 2024-05-12 ENCOUNTER — Ambulatory Visit (HOSPITAL_COMMUNITY): Payer: Self-pay | Admitting: Physician Assistant

## 2024-05-12 ENCOUNTER — Encounter (HOSPITAL_COMMUNITY): Payer: Self-pay | Admitting: Neurological Surgery

## 2024-05-12 DIAGNOSIS — G5622 Lesion of ulnar nerve, left upper limb: Secondary | ICD-10-CM

## 2024-05-12 DIAGNOSIS — G5602 Carpal tunnel syndrome, left upper limb: Secondary | ICD-10-CM | POA: Diagnosis present

## 2024-05-12 DIAGNOSIS — Z01818 Encounter for other preprocedural examination: Secondary | ICD-10-CM

## 2024-05-12 HISTORY — PX: CARPAL TUNNEL RELEASE: SHX101

## 2024-05-12 HISTORY — PX: ULNAR NERVE TRANSPOSITION: SHX2595

## 2024-05-12 LAB — GLUCOSE, CAPILLARY
Glucose-Capillary: 127 mg/dL — ABNORMAL HIGH (ref 70–99)
Glucose-Capillary: 101 mg/dL — ABNORMAL HIGH (ref 70–99)
Glucose-Capillary: 93 mg/dL (ref 70–99)
Glucose-Capillary: 101 mg/dL — ABNORMAL HIGH (ref 70–99)

## 2024-05-12 SURGERY — CARPAL TUNNEL RELEASE
Anesthesia: General | Laterality: Left

## 2024-05-12 MED ORDER — ONDANSETRON HCL 4 MG/2ML IJ SOLN
INTRAMUSCULAR | Status: DC | PRN
  Administered 2024-05-12: 4 mg via INTRAVENOUS

## 2024-05-12 MED ORDER — FENTANYL CITRATE (PF) 250 MCG/5ML IJ SOLN
INTRAMUSCULAR | Status: DC | PRN
  Administered 2024-05-12 (×2): 50 ug via INTRAVENOUS

## 2024-05-12 MED ORDER — OXYCODONE HCL 5 MG PO TABS
ORAL_TABLET | ORAL | Status: AC
  Filled 2024-05-12: qty 1

## 2024-05-12 MED ORDER — OXYCODONE HCL 5 MG PO TABS
5.0000 mg | ORAL_TABLET | Freq: Once | ORAL | Status: AC | PRN
  Administered 2024-05-12: 5 mg via ORAL

## 2024-05-12 MED ORDER — FENTANYL CITRATE (PF) 100 MCG/2ML IJ SOLN
INTRAMUSCULAR | Status: AC
  Filled 2024-05-12: qty 2

## 2024-05-12 MED ORDER — CHLORHEXIDINE GLUCONATE 0.12 % MT SOLN
15.0000 mL | Freq: Once | OROMUCOSAL | Status: AC
  Administered 2024-05-12: 15 mL via OROMUCOSAL
  Filled 2024-05-12: qty 15

## 2024-05-12 MED ORDER — HYDROMORPHONE HCL 1 MG/ML IJ SOLN
INTRAMUSCULAR | Status: DC | PRN
  Administered 2024-05-12: .5 mg via INTRAVENOUS

## 2024-05-12 MED ORDER — BUPIVACAINE HCL (PF) 0.25 % IJ SOLN
INTRAMUSCULAR | Status: AC
  Filled 2024-05-12: qty 30

## 2024-05-12 MED ORDER — MIDAZOLAM HCL (PF) 2 MG/2ML IJ SOLN
INTRAMUSCULAR | Status: DC | PRN
  Administered 2024-05-12: 2 mg via INTRAVENOUS

## 2024-05-12 MED ORDER — MIDAZOLAM HCL 2 MG/2ML IJ SOLN
INTRAMUSCULAR | Status: AC
  Filled 2024-05-12: qty 2

## 2024-05-12 MED ORDER — BUPIVACAINE HCL (PF) 0.25 % IJ SOLN
INTRAMUSCULAR | Status: DC | PRN
  Administered 2024-05-12: 10 mL

## 2024-05-12 MED ORDER — LACTATED RINGERS IV SOLN: INTRAVENOUS | Status: DC

## 2024-05-12 MED ORDER — PROPOFOL 10 MG/ML IV BOLUS
INTRAVENOUS | Status: DC | PRN
  Administered 2024-05-12: 150 mg via INTRAVENOUS

## 2024-05-12 MED ORDER — DEXAMETHASONE SOD PHOSPHATE PF 10 MG/ML IJ SOLN: INTRAMUSCULAR | Status: DC | PRN

## 2024-05-12 MED ORDER — ONDANSETRON HCL 4 MG/2ML IJ SOLN: 4.0000 mg | Freq: Four times a day (QID) | INTRAMUSCULAR | Status: DC | PRN

## 2024-05-12 MED ORDER — HYDROMORPHONE HCL 1 MG/ML IJ SOLN
INTRAMUSCULAR | Status: AC
  Filled 2024-05-12: qty 0.5

## 2024-05-12 MED ORDER — CHLORHEXIDINE GLUCONATE CLOTH 2 % EX PADS: 6.0000 | MEDICATED_PAD | Freq: Once | CUTANEOUS | Status: DC

## 2024-05-12 MED ORDER — BACITRACIN ZINC 500 UNIT/GM EX OINT
TOPICAL_OINTMENT | CUTANEOUS | Status: AC
  Filled 2024-05-12: qty 28.35

## 2024-05-12 MED ORDER — LIDOCAINE 2% (20 MG/ML) 5 ML SYRINGE
INTRAMUSCULAR | Status: DC | PRN
  Administered 2024-05-12: 80 mg via INTRAVENOUS

## 2024-05-12 MED ORDER — CEFAZOLIN SODIUM-DEXTROSE 2-4 GM/100ML-% IV SOLN
2.0000 g | INTRAVENOUS | Status: AC
  Administered 2024-05-12: 2 g via INTRAVENOUS
  Filled 2024-05-12: qty 100

## 2024-05-12 MED ORDER — OXYCODONE-ACETAMINOPHEN 10-325 MG PO TABS: 1.0000 | ORAL_TABLET | ORAL | 0 refills | Status: AC | PRN

## 2024-05-12 MED ORDER — FENTANYL CITRATE (PF) 100 MCG/2ML IJ SOLN
25.0000 ug | INTRAMUSCULAR | Status: DC | PRN
  Administered 2024-05-12 (×3): 50 ug via INTRAVENOUS

## 2024-05-12 MED ORDER — GABAPENTIN 300 MG PO CAPS: 300.0000 mg | ORAL_CAPSULE | Freq: Three times a day (TID) | ORAL | 3 refills | Status: AC

## 2024-05-12 MED ORDER — ORAL CARE MOUTH RINSE: 15.0000 mL | Freq: Once | OROMUCOSAL | Status: AC

## 2024-05-12 MED ORDER — OXYCODONE HCL 5 MG/5ML PO SOLN: 5.0000 mg | Freq: Once | ORAL | Status: AC | PRN

## 2024-05-12 MED ORDER — INSULIN ASPART 100 UNIT/ML IJ SOLN: 0.0000 [IU] | INTRAMUSCULAR | Status: DC | PRN

## 2024-05-12 MED ORDER — CHLORHEXIDINE GLUCONATE CLOTH 2 % EX PADS
6.0000 | MEDICATED_PAD | Freq: Once | CUTANEOUS | Status: AC
  Administered 2024-05-12: 6 via TOPICAL

## 2024-05-12 MED ORDER — 0.9 % SODIUM CHLORIDE (POUR BTL) OPTIME
TOPICAL | Status: DC | PRN
  Administered 2024-05-12: 1000 mL

## 2024-05-12 SURGICAL SUPPLY — 42 items
BAG COUNTER SPONGE SURGICOUNT (BAG) ×2 IMPLANT
BENZOIN TINCTURE PRP APPL 2/3 (GAUZE/BANDAGES/DRESSINGS) ×2 IMPLANT
BLADE SURG 15 STRL LF DISP TIS (BLADE) ×2 IMPLANT
BNDG ELASTIC 3INX 5YD STR LF (GAUZE/BANDAGES/DRESSINGS) IMPLANT
BNDG ELASTIC 4X5.8 VLCR STR LF (GAUZE/BANDAGES/DRESSINGS) ×2 IMPLANT
BNDG GAUZE DERMACEA FLUFF 4 (GAUZE/BANDAGES/DRESSINGS) ×2 IMPLANT
CABLE BIPOLOR RESECTION CORD (MISCELLANEOUS) ×2 IMPLANT
CANISTER SUCTION 3000ML PPV (SUCTIONS) ×2 IMPLANT
DERMABOND ADVANCED .7 DNX12 (GAUZE/BANDAGES/DRESSINGS) IMPLANT
DRAPE EXTREMITY T 121X128X90 (DISPOSABLE) ×2 IMPLANT
DRAPE HALF SHEET 40X57 (DRAPES) ×2 IMPLANT
DURAPREP 26ML APPLICATOR (WOUND CARE) ×2 IMPLANT
ELECT CAUTERY BLADE 6.4 (BLADE) ×2 IMPLANT
ELECTRODE REM PT RTRN 9FT ADLT (ELECTROSURGICAL) ×2 IMPLANT
GAUZE 4X4 16PLY ~~LOC~~+RFID DBL (SPONGE) ×2 IMPLANT
GAUZE SPONGE 4X4 12PLY STRL (GAUZE/BANDAGES/DRESSINGS) ×2 IMPLANT
GLOVE BIO SURGEON STRL SZ7 (GLOVE) ×2 IMPLANT
GLOVE BIO SURGEON STRL SZ8 (GLOVE) ×2 IMPLANT
GLOVE BIOGEL PI IND STRL 7.0 (GLOVE) ×2 IMPLANT
GOWN STRL REUS W/ TWL LRG LVL3 (GOWN DISPOSABLE) ×2 IMPLANT
GOWN STRL REUS W/ TWL XL LVL3 (GOWN DISPOSABLE) ×2 IMPLANT
GOWN STRL REUS W/TWL 2XL LVL3 (GOWN DISPOSABLE) ×2 IMPLANT
KIT BASIN OR (CUSTOM PROCEDURE TRAY) ×2 IMPLANT
KIT TURNOVER KIT B (KITS) ×2 IMPLANT
NDL HYPO 25X1 1.5 SAFETY (NEEDLE) ×2 IMPLANT
PACK SRG BSC III STRL LF ECLPS (CUSTOM PROCEDURE TRAY) ×2 IMPLANT
PAD ARMBOARD POSITIONER FOAM (MISCELLANEOUS) ×2 IMPLANT
PENCIL BUTTON HOLSTER BLD 10FT (ELECTRODE) ×2 IMPLANT
SOLN 0.9% NACL POUR BTL 1000ML (IV SOLUTION) ×2 IMPLANT
SOLN STERILE WATER BTL 1000 ML (IV SOLUTION) ×2 IMPLANT
STOCKINETTE 4X48 STRL (DRAPES) ×2 IMPLANT
STRIP CLOSURE SKIN 1/2X4 (GAUZE/BANDAGES/DRESSINGS) ×2 IMPLANT
SUT ETHILON 4 0 PS 2 18 (SUTURE) ×2 IMPLANT
SUT VIC AB 2-0 CP2 18 (SUTURE) ×4 IMPLANT
SUT VIC AB 3-0 SH 8-18 (SUTURE) ×2 IMPLANT
SYR BULB EAR ULCER 3OZ GRN STR (SYRINGE) ×2 IMPLANT
SYR BULB IRRIG 60ML STRL (SYRINGE) ×2 IMPLANT
SYR CONTROL 10ML LL (SYRINGE) ×2 IMPLANT
TOWEL GREEN STERILE (TOWEL DISPOSABLE) ×2 IMPLANT
TOWEL GREEN STERILE FF (TOWEL DISPOSABLE) ×2 IMPLANT
TUBE CONNECTING 12X1/4 (SUCTIONS) ×2 IMPLANT
UNDERPAD 30X36 HEAVY ABSORB (UNDERPADS AND DIAPERS) ×2 IMPLANT

## 2024-05-12 NOTE — H&P (Signed)
 Subjective: Patient is a 68 y.o. male admitted for L hand numbness and weakness. Onset of symptoms was several months ago, gradually worsening since that time.  The pain is rated moderate, and is located at the left palm. The pain is described as aching and occurs intermittently. The symptoms have been progressive. Symptoms are exacerbated by exercise. EMGs showed L CTS and ulnar neuropathy   Past Medical History:  Diagnosis Date   Arthritis    Chronic back pain    on chronic opioids   Diabetes mellitus without complication (HCC)    GERD (gastroesophageal reflux disease)    HLD (hyperlipidemia)    Hypertension    no meds   Vitamin D  deficiency     Past Surgical History:  Procedure Laterality Date   ANTERIOR CERVICAL DECOMP/DISCECTOMY FUSION N/A 09/05/2022   Procedure: Anterior Cervical Decompression Fusion  - Cervical three-Cervical four;  Surgeon: Louis Shove, MD;  Location: [facility] OR;  Service: Neurosurgery;  Laterality: N/A;   CERVICAL DISC SURGERY  99   LUMBAR FUSION     LUMBAR LAMINECTOMY/DECOMPRESSION MICRODISCECTOMY Bilateral 03/09/2021   Procedure: Laminectomy and Foraminotomy - bilateral - Lumbar two-Lumbar three;  Surgeon: Louis Shove, MD;  Location: [facility] OR;  Service: Neurosurgery;  Laterality: Bilateral;   ROTATOR CUFF REPAIR Bilateral    SHOULDER ARTHROSCOPY WITH ROTATOR CUFF REPAIR AND SUBACROMIAL DECOMPRESSION Right 12/30/2018   Procedure: SHOULDER ARTHROSCOPY WITH ROTATOR CUFF REPAIR AND SUBACROMIAL DECOMPRESSION, DISTAL CLAVICLE EXICISION,;  Surgeon: Cristy Bonner DASEN, MD;  Location: [facility];  Service: Orthopedics;  Laterality: Right;   SHOULDER ARTHROSCOPY WITH SUBACROMIAL DECOMPRESSION, ROTATOR CUFF REPAIR AND BICEP TENDON REPAIR Left 06/19/2019   Procedure: LEFT SHOULDER ARTHROSCOPY, EXTENTSIVE DEBRIDEMENT, DISTAL CLAVICULECTOMY, SUBACROMIAL DECOMPRESSION, PARTIAL ACROMIOPLASTY WITH CORACOACROMIAL RELEASE, ROTATOR CUFFF REPAIR AND BICEP TENODESIS;  Surgeon: Cristy Bonner DASEN, MD;  Location: [facility];  Service: Orthopedics;  Laterality: Left;    Prior to Admission medications   Medication Sig Start Date End Date Taking? Authorizing Provider  cyclobenzaprine  (FLEXERIL ) 10 MG tablet Take 1 tablet (10 mg total) by mouth 3 (three) times daily as needed for muscle spasms. 04/17/24  Yes [name], NP  empagliflozin  (JARDIANCE ) 25 MG TABS tablet Take 1 tablet (25 mg total) by mouth daily. 02/26/24  Yes [name], NP  esomeprazole  (NEXIUM ) 40 MG capsule TAKE 1 CAPSULE BY MOUTH EVERY MORNING BEFORE BREAKFAST 05/06/24  Yes [name], NP  gabapentin  (NEURONTIN ) 300 MG capsule Take 1 capsule (300 mg total) by mouth 3 (three) times daily. 05/12/24  Yes [name], NP  insulin  glargine (LANTUS ) 100 UNIT/ML Solostar Pen Inject 36 Units into the skin daily. May titrate up to 42 units daily as directed by your provider. Patient taking differently: Inject 30 Units into the skin daily. May titrate up to 42 units daily as directed by your provider. 02/26/24  Yes [name], NP  insulin  lispro (HUMALOG ) 100 UNIT/ML KwikPen Inject 2 units + correction dose (per sliding scale) three times daily 15 minutes before each meal. (<150= 0 unit; BG 150-200= 2 unit; BG 201-250= 4 unit; BG 251-300= 6 unit; BG 301-350= 8 unit; BG 351-400= 10 unit; BG >400= 12 unit) 02/26/24  Yes [name], NP  lisinopril -hydrochlorothiazide  (ZESTORETIC ) 20-25 MG tablet Take 1 tablet by mouth daily. 02/26/24 02/25/25 Yes [name], NP  oxyCODONE -acetaminophen  (PERCOCET) 10-325 MG tablet Take 1 tablet by mouth 4 (four) times daily as needed for pain. 08/11/22  Yes [provider]  simvastatin  (ZOCOR ) 10 MG tablet TAKE 1 TABLET(10 MG) BY MOUTH DAILY 02/26/24  Yes Nichols, [name] S, NP  Accu-Chek Softclix Lancets lancets Use 3 (three) times daily as directed to check blood sugar 01/10/24   Amin, Ankit C, MD  Blood Glucose Monitoring Suppl (BLOOD GLUCOSE MONITOR  SYSTEM) w/Device KIT Use 3 (three) times daily. 01/10/24   Amin, Ankit C, MD  Continuous Glucose Receiver (FREESTYLE LIBRE 3 READER) DEVI Use 4 (four) times daily. 01/10/24   Amin, Ankit C, MD  Continuous Glucose Sensor (FREESTYLE LIBRE 3 PLUS SENSOR) MISC Change sensor every 15 days. 02/26/24   [name], NP  Insulin  Pen Needle 32G X 4 MM MISC Use 3 (three) times daily. 01/10/24   Caleen Burgess BROCKS, MD   Allergies  Allergen Reactions   Cymbalta [Duloxetine Hcl] Nausea And Vomiting and Other (See Comments)    Dizziness Headaches    Shrimp [Shellfish Allergy] Nausea And Vomiting   Valium  [Diazepam ] Itching    Social History   Tobacco Use   Smoking status: Former    Current packs/day: 0.00    Average packs/day: 0.3 packs/day for 25.0 years (6.3 ttl pk-yrs)    Types: Cigarettes    Start date: 04/27/1995    Quit date: 04/26/2020    Years since quitting: 4.0   Smokeless tobacco: Never  Substance Use Topics   Alcohol use: Yes    Alcohol/week: 4.0 standard drinks of alcohol    Types: 4 Cans of beer per week    Comment: twice a week    Family History  Problem Relation Age of Onset   Heart disease Mother    Diabetes Mother    Amblyopia Neg Hx    Blindness Neg Hx    Cataracts Neg Hx    Hypertension Neg Hx    Macular degeneration Neg Hx    Retinal detachment Neg Hx    Strabismus Neg Hx    Esophageal cancer Neg Hx    Colon cancer Neg Hx    Pancreatic cancer Neg Hx    Ulcerative colitis Neg Hx    Inflammatory bowel disease Neg Hx    Liver disease Neg Hx    Rectal cancer Neg Hx    Stomach cancer Neg Hx      Review of Systems  Positive ROS: neg  All other systems have been reviewed and were otherwise negative with the exception of those mentioned in the HPI and as above.  Objective: Vital signs in last 24 hours: Temp:  [98.6 F (37 C)] 98.6 F (37 C) (12/01 1040) Pulse Rate:  [68] 68 (12/01 1040) Resp:  [18] 18 (12/01 1040) BP: (138)/(75) 138/75 (12/01 1040) SpO2:   [98 %] 98 % (12/01 1040) Weight:  [102.1 kg] 102.1 kg (12/01 1040)  General Appearance: Alert, cooperative, no distress, appears stated age Head: Normocephalic, without obvious abnormality, atraumatic Eyes: PERRL, conjunctiva/corneas clear, EOM's intact    Neck: Supple, symmetrical, trachea midline Back: Symmetric, no curvature, ROM normal, no CVA tenderness Lungs:  respirations unlabored Heart: Regular rate and rhythm Abdomen: Soft, non-tender Extremities: Extremities normal, atraumatic, no cyanosis or edema Pulses: 2+ and symmetric all extremities Skin: Skin color, texture, turgor normal, no rashes or lesions  NEUROLOGIC:   Mental status: Alert and oriented x4,  no aphasia, good attention span, fund of knowledge, and memory Motor Exam - grossly normal Sensory Exam - grossly normal Reflexes: 1+ Coordination - grossly normal Gait - grossly normal Balance - grossly normal Cranial Nerves:  I: smell Not tested  II: visual acuity  OS: nl    OD: nl  II: visual fields Full to confrontation  II: pupils Equal, round, reactive to light  III,VII: ptosis None  III,IV,VI: extraocular muscles  Full ROM  V: mastication Normal  V: facial light touch sensation  Normal  V,VII: corneal reflex  Present  VII: facial muscle function - upper  Normal  VII: facial muscle function - lower Normal  VIII: hearing Not tested  IX: soft palate elevation  Normal  IX,X: gag reflex Present  XI: trapezius strength  5/5  XI: sternocleidomastoid strength 5/5  XI: neck flexion strength  5/5  XII: tongue strength  Normal    Data Review Lab Results  Component Value Date   WBC 8.1 05/07/2024   HGB 10.4 (L) 05/07/2024   HCT 35.1 (L) 05/07/2024   MCV 71.9 (L) 05/07/2024   PLT 387 05/07/2024   Lab Results  Component Value Date   NA 136 04/30/2024   K 4.3 04/30/2024   CL 100 04/30/2024   CO2 21 04/30/2024   BUN 12 04/30/2024   CREATININE 0.89 04/30/2024   GLUCOSE 294 (H) 04/30/2024   Lab Results   Component Value Date   INR 1.3 (H) 01/01/2024    Assessment/Plan:  Estimated body mass index is 30.52 kg/m as calculated from the following:   Height as of this encounter: 6' (1.829 m).   Weight as of this encounter: 102.1 kg. Patient admitted for L CTS and Ulnar nerve release. Patient has failed a reasonable attempt at conservative therapy.  I explained the condition and procedure to the patient and answered any questions.  Patient wishes to proceed with procedure as planned. Understands risks/ benefits and typical outcomes of procedure.   Alm GORMAN Molt 05/12/2024 2:37 PM

## 2024-05-12 NOTE — Discharge Summary (Signed)
 Physician Discharge Summary  Patient ID: Riley Clarke MRN: [id number] DOB/AGE: 1956-05-27 68 y.o.  Admit date: 05/12/2024 Discharge date: 05/12/2024  Admission Diagnoses: Left carpal tunnel syndrome and left ulnar neuropathy    Discharge Diagnoses: Same   Discharged Condition: Good  Hospital Course: The patient was admitted on 05/12/2024 and taken to the operating room where the patient underwent left carpal tunnel release and left ulnar nerve release at the elbow. The patient tolerated the procedure well and was taken to the recovery room and then to the PACU in stable condition. The hospital course was routine. There were no complications. The wound remained clean dry and intact. Pt had appropriate arm soreness. No complaints of new N/T/W. The patient remained afebrile with stable vital signs, and tolerated a regular diet. The patient continued to increase activities, and pain was well controlled with oral pain medications.   Consults: None  Significant Diagnostic Studies:  Results for orders placed or performed during the hospital encounter of 05/12/24  Glucose, capillary   Collection Time: 05/12/24 10:44 AM  Result Value Ref Range   Glucose-Capillary 127 (H) 70 - 99 mg/dL   Comment 1 Notify RN    Comment 2 Document in Chart   Glucose, capillary   Collection Time: 05/12/24 12:57 PM  Result Value Ref Range   Glucose-Capillary 101 (H) 70 - 99 mg/dL   Comment 1 Notify RN    Comment 2 Document in Chart   Glucose, capillary   Collection Time: 05/12/24  2:48 PM  Result Value Ref Range   Glucose-Capillary 93 70 - 99 mg/dL   Comment 1 Notify RN    Comment 2 Document in Chart     NCV with EMG(electromyography) Result Date: 04/17/2024 Tobie Tonita POUR, DO     04/17/2024  2:37 PM [facility] [address] [place], [address]  [place], [place] [address] Tel: [phone] Fax: [phone] Test Date:  04/17/2024 Patient: Riley Clarke DOB: Nov 07, 1955 Physician: Tonita Tobie, DO  Sex: Male Height: 6' 0 Ref Phys: Gerard Beck, NP ID#: [id number]   Technician:  History: This is a 68 year old man with history of cervical surgery referred for evaluation of left hand paresthesias. NCV & EMG Findings: Extensive electrodiagnostic testing of the left upper extremity and additional studies of the right shows: Left median sensory response shows prolonged latency (4.0 ms) and reduced amplitude (8.6 V).  Left ulnar sensory response shows prolonged latency (L3.3 ms) and borderline normal amplitude.  Right median, right ulnar, and bilateral radial sensory responses are within normal limits. Left median motor response is within normal limits.  Left ulnar motor response shows slowed conduction velocity across the elbow (A Elbow-B Elbow, 32 m/s).  Chronic motor axon loss changes are seen affecting the ulnar innervated muscles, without accompanying active denervation. Impression: Left ulnar neuropathy with slowing across the elbow, demyelinating, moderate. Left median neuropathy at or distal to the wrist, consistent with a clinical diagnosis of carpal tunnel syndrome.  Overall, these findings are moderate in degree electrically. As compared to prior study on 02/04/2020, there has been mild progression of both carpal tunnel syndrome and ulnar neuropathy. ___________________________ Tonita Tobie, DO Nerve Conduction Studies  Stim Site NR Peak (ms) Norm Peak (ms) O-P Amp (V) Norm O-P Amp Left Median Anti Sensory (2nd Digit)  32 C Wrist    *4.0 <3.8 *8.6 >10 Right Median Anti Sensory (2nd Digit)  32 C Wrist    3.2 <3.8 20.4 >10 Left Radial Anti Sensory (Base 1st Digit)  32 C Wrist    2.1 <2.8 15.5 >10 Right Radial Anti Sensory (Base 1st Digit)  32 C Wrist    2.3 <2.8 14.9 >10 Left Ulnar Anti Sensory (5th Digit)  32 C Wrist    *3.3 <3.2 5.3 >5 Right Ulnar Anti Sensory (5th Digit)  32 C Wrist    3.1 <3.2 8.7 >5  Stim Site NR Onset (ms) Norm Onset (ms) O-P Amp (mV) Norm O-P Amp Site1 Site2 Delta-0 (ms) Dist  (cm) Vel (m/s) Norm Vel (m/s) Left Median Motor (Abd Poll Brev)  32 C Wrist    3.4 <4.0 10.5 >5 Elbow Wrist 6.8 36.0 53 >50 Elbow    10.2  9.4        Left Ulnar Motor (Abd Dig Minimi)  32 C Wrist    2.7 <3.1 7.5 >7 B Elbow Wrist 4.4 25.0 57 >50 B Elbow    7.1  6.7  A Elbow B Elbow 3.1 10.0 *32 >50 A Elbow    10.2  6.3        Electromyography  Side Muscle Ins.Act Fibs Fasc Recrt Amp Dur Poly Activation Comment Left 1stDorInt Nml Nml Nml *1- *1+ *1+ *1+ Nml N/A Left Abd Poll Brev Nml Nml Nml Nml Nml Nml Nml Nml N/A Left PronatorTeres Nml Nml Nml Nml Nml Nml Nml Nml N/A Left Biceps Nml Nml Nml Nml Nml Nml Nml Nml N/A Left Triceps Nml Nml Nml Nml Nml Nml Nml Nml N/A Left Deltoid Nml Nml Nml Nml Nml Nml Nml Nml N/A Left Abd Dig Min Nml Nml Nml *1- *1+ *1+ *1+ Nml N/A Left FlexCarpiUln Nml Nml Nml *1- *1+ *1+ *1+ Nml N/A Waveforms:                 Antibiotics:  Anti-infectives (From admission, onward)    Start     Dose/Rate Route Frequency Ordered Stop   05/12/24 1145  ceFAZolin  (ANCEF ) IVPB 2g/100 mL premix        2 g 200 mL/hr over 30 Minutes Intravenous On call to O.R. 05/12/24 1131 05/12/24 1456       Discharge Exam: Blood pressure 138/75, pulse 68, temperature 98.6 F (37 C), temperature source Oral, resp. rate 18, height 6' (1.829 m), weight 102.1 kg, SpO2 98%. Neurologic: Grossly normal Dressing is dry  Discharge Medications:   Allergies as of 05/12/2024       Reactions   Cymbalta [duloxetine Hcl] Nausea And Vomiting, Other (See Comments)   Dizziness Headaches    Shrimp [shellfish Allergy] Nausea And Vomiting   Valium  [diazepam ] Itching        Medication List     TAKE these medications    Accu-Chek Guide w/Device Kit Use 3 (three) times daily.   Accu-Chek Softclix Lancets lancets Use 3 (three) times daily as directed to check blood sugar   cyclobenzaprine  10 MG tablet Commonly known as: FLEXERIL  Take 1 tablet (10 mg total) by mouth 3 (three) times daily as needed for  muscle spasms.   empagliflozin  25 MG Tabs tablet Commonly known as: Jardiance  Take 1 tablet (25 mg total) by mouth daily.   esomeprazole  40 MG capsule Commonly known as: NEXIUM  TAKE 1 CAPSULE BY MOUTH EVERY MORNING BEFORE BREAKFAST   FreeStyle Libre 3 Plus Sensor Misc Change sensor every 15 days.   FreeStyle Libre 3 Reader [place]ze Use 4 (four) times daily.   gabapentin  300 MG capsule Commonly known as: NEURONTIN  Take 1 capsule (300 mg total) by mouth 3 (three) times daily.  insulin  glargine 100 UNIT/ML Solostar Pen Commonly known as: LANTUS  Inject 36 Units into the skin daily. May titrate up to 42 units daily as directed by your provider. What changed: how much to take   insulin  lispro 100 UNIT/ML KwikPen Commonly known as: HUMALOG  Inject 2 units + correction dose (per sliding scale) three times daily 15 minutes before each meal. (<150= 0 unit; BG 150-200= 2 unit; BG 201-250= 4 unit; BG 251-300= 6 unit; BG 301-350= 8 unit; BG 351-400= 10 unit; BG >400= 12 unit)   lisinopril -hydrochlorothiazide  20-25 MG tablet Commonly known as: ZESTORETIC  Take 1 tablet by mouth daily.   oxyCODONE -acetaminophen  10-325 MG tablet Commonly known as: PERCOCET Take 1 tablet by mouth every 4 (four) hours as needed for pain. What changed: when to take this   simvastatin  10 MG tablet Commonly known as: ZOCOR  TAKE 1 TABLET(10 MG) BY MOUTH DAILY   TechLite Pen Needles 32G X 4 MM Misc Generic drug: Insulin  Pen Needle Use 3 (three) times daily.        Disposition: Home   Final Dx: Left ulnar nerve release and left carpal tunnel release  Discharge Instructions      Remove dressing in 72 hours   Complete by: As directed    Call MD for:  difficulty breathing, headache or visual disturbances   Complete by: As directed    Call MD for:  persistant nausea and vomiting   Complete by: As directed    Call MD for:  redness, tenderness, or signs of infection (pain, swelling, redness, odor or  green/yellow discharge around incision site)   Complete by: As directed    Call MD for:  severe uncontrolled pain   Complete by: As directed    Call MD for:  temperature >100.4   Complete by: As directed    Diet - low sodium heart healthy   Complete by: As directed    Increase activity slowly   Complete by: As directed           Signed: Alm GORMAN Molt 05/12/2024, 4:17 PM

## 2024-05-12 NOTE — Transfer of Care (Signed)
 Immediate Anesthesia Transfer of Care Note  Patient: Riley Clarke  Procedure(s) Performed: CARPAL TUNNEL RELEASE (Left) ULNAR NERVE DECOMPRESSION/TRANSPOSITION (Left)  Patient Location: PACU  Anesthesia Type:General  Level of Consciousness: awake, alert , and oriented  Airway & Oxygen Therapy: Patient Spontanous Breathing  Post-op Assessment: Report given to RN and Post -op Vital signs reviewed and stable  Post vital signs: Reviewed and stable  Last Vitals:  Vitals Value Taken Time  BP 158/100 05/12/24 16:19  Temp 36.4 C 05/12/24 16:19  Pulse 88 05/12/24 16:20  Resp 19 05/12/24 16:21  SpO2 88 % 05/12/24 16:20  Vitals shown include unfiled device data.  Last Pain:  Vitals:   05/12/24 1152  TempSrc:   PainSc: 8       Patients Stated Pain Goal: 0 (05/12/24 1152)  Complications: No notable events documented.

## 2024-05-12 NOTE — Op Note (Signed)
 05/12/2024  4:08 PM  PATIENT:  Riley Clarke  68 y.o. male  PRE-OPERATIVE DIAGNOSIS:  1.  Left ulnar neuropathy at the elbow, 2.  Left carpal tunnel syndrome at the wrist  POST-OPERATIVE DIAGNOSIS:  same  PROCEDURE:  1.  Left ulnar nerve release at the elbow, 2.  Left carpal tunnel release  SURGEON:  Alm Molt, MD  ASSISTANTS: Suzen Pean, FNP  ANESTHESIA:   General  EBL: minimal ml  Total I/O In: 100 [IV Piggyback:100] Out: -   BLOOD ADMINISTERED: none  DRAINS: None  SPECIMEN:  none  INDICATION FOR PROCEDURE: This patient presented with numbness and weakness in his left hand. Imaging showed no real cervical spine disease but nerve conduction study showed a left median neuropathy and ulnar neuropathy. The patient tried conservative measures without relief. Pain was debilitating. Recommended left carpal tunnel release and ulnar nerve release. Patient understood the risks, benefits, and alternatives and potential outcomes and wished to proceed.  Patient was a patient of Dr. Marda, but Dr. Louis underwent emergency surgery today and therefore was unavailable to do the surgery.  The patient chose to proceed with the surgery with me.  I met with him before the surgery and we discussed his symptoms, I examined him, and we talked at length about the surgery, its risks and potential benefits and the typical outcomes.  He understood the risks and alternatives and wished to proceed.  PROCEDURE DETAILS: The patient was taken to the operating room and after induction of adequate generalized endotracheal anesthesia his left arm was extended on an armboard and prepped circumferentially from the fingertips to the axilla with DuraPrep.  He was then draped in the usual sterile fashion.  We started with the carpal tunnel release.  We injected 8 cc of local anesthesia into the palm and made a small palmar incision from the distal wrist crease into the palm in line with the webspace between the  3rd and 4th digits.  We dissected down through the palmar fascia to identify the underlying transverse carpal ligament.  The ligament was opened to identify the underlying median nerve.  We then spread between the nerve and the ligament with a hemostat and completely transected the ligament distally and proximally until the nerve was free.  We then palpated with a hemostat proximally and distally.  We irrigated with saline solution.  The nerve appeared to be free.  We then dried the surgical bed with bipolar cautery.  We then closed the subcutaneous tissue with 2-0 Vicryl.    We closed the skin with interrupted 4-0 Ethilon vertical mattress sutures.  We then turned our attention to the left ulnar nerve release.  We made a curvilinear incision over the medial epicondyle of the left elbow after injecting local anesthesia.  We dissected down through the subcutaneous tissues and identified at the medial epicondyle.  We then dissected in the cubital tunnel below the medial epicondyle through very thickened scar across the surface of the nerve.  We were very careful to dissect through this thickened tissue to identify the underlying ulnar nerve.  The tissue was quite thickened and quite compressive of the nerve.  We identified the underlying nerve and then spread between the thickened tissue and the nerve and completely transected the tissue both proximally and distally from the medial heads of the triceps to the flexor carpi ulnaris until the nerve was free.  We did not fully circumferentially decompress the nerve to avoid interfering with its blood supply.  We did flex the elbow and there was no subluxation.  We then irrigated with saline solution.  We inspected the nerve once again.  We palpated both proximally and distally once again.  Then dried the surgical bed with bipolar cautery.  We closed the subcutaneous tissues with 2-0 Vicryl and closed the subcuticular tissue with 3-0 Vicryl.  The skin was closed benzoin  and Steri-Strips.  We then removed the drapes and we wrapped the hand and the elbow with a Kerlix and Ace bandage.  The patient was then awakened from general anesthesia and transferred to the recovery room stable condition.  At the end of the procedure all sponge needle and instrument counts were correct   PLAN OF CARE: Discharge to home after PACU  PATIENT DISPOSITION:  PACU - hemodynamically stable.   Delay start of Pharmacological VTE agent (>24hrs) due to surgical blood loss or risk of bleeding:  yes

## 2024-05-12 NOTE — Anesthesia Procedure Notes (Signed)
 Procedure Name: LMA Insertion Date/Time: 05/12/2024 2:59 PM  Performed by: Lockie Flesher, CRNAPre-anesthesia Checklist: Patient identified, Emergency Drugs available, Suction available and Patient being monitored Patient Re-evaluated:Patient Re-evaluated prior to induction Oxygen Delivery Method: Circle System Utilized Preoxygenation: Pre-oxygenation with 100% oxygen Induction Type: IV induction Ventilation: Mask ventilation without difficulty LMA: LMA inserted LMA Size: 5.0 Number of attempts: 1 Airway Equipment and Method: Bite block Placement Confirmation: positive ETCO2 Tube secured with: Tape Dental Injury: Teeth and Oropharynx as per pre-operative assessment

## 2024-05-13 ENCOUNTER — Encounter (HOSPITAL_COMMUNITY): Payer: Self-pay | Admitting: Neurological Surgery

## 2024-05-15 NOTE — Anesthesia Postprocedure Evaluation (Signed)
 Anesthesia Post Note  Patient: Riley Clarke  Procedure(s) Performed: CARPAL TUNNEL RELEASE (Left) ULNAR NERVE DECOMPRESSION/TRANSPOSITION (Left)     Patient location during evaluation: PACU Anesthesia Type: General Level of consciousness: awake and alert Pain management: pain level controlled Vital Signs Assessment: post-procedure vital signs reviewed and stable Respiratory status: spontaneous breathing, nonlabored ventilation, respiratory function stable and patient connected to nasal cannula oxygen Cardiovascular status: blood pressure returned to baseline and stable Postop Assessment: no apparent nausea or vomiting Anesthetic complications: no   No notable events documented.  Last Vitals:  Vitals:   05/12/24 1700 05/12/24 1715  BP: (!) 153/64 (!) 145/86  Pulse: 76 75  Resp: 16 20  Temp:  36.8 C  SpO2: 100% 100%    Last Pain:  Vitals:   05/12/24 1724  TempSrc:   PainSc: 4                  Collis Thede S

## 2024-05-22 ENCOUNTER — Encounter: Admitting: Gastroenterology

## 2024-05-22 NOTE — Progress Notes (Deleted)
 [facility] Progress Note  Chief Complaint: ***   Subjective  HPI: Patient of Dr. Wilhelmenia referred after APP office visit 04/02/2024 regarding recurrent hemorrhoidal bleeding.  Internal hemorrhoids noted on last colonoscopy with Dr. Wilhelmenia October 2024 Also iron deficiency anemia for which Dr. Wilhelmenia recently recommended IV iron infusions and an EGD.   ***  ROS: Cardiovascular:  no chest pain Respiratory: no dyspnea  The patient's Past Medical, Family and Social History were reviewed and are on file in the EMR. Past Medical History:  Diagnosis Date   Arthritis    Chronic back pain    on chronic opioids   Diabetes mellitus without complication (HCC)    GERD (gastroesophageal reflux disease)    HLD (hyperlipidemia)    Hypertension    no meds   Vitamin D  deficiency     Past Surgical History:  Procedure Laterality Date   ANTERIOR CERVICAL DECOMP/DISCECTOMY FUSION N/A 09/05/2022   Procedure: Anterior Cervical Decompression Fusion  - Cervical three-Cervical four;  Surgeon: Louis Shove, MD;  Location: [facility] OR;  Service: Neurosurgery;  Laterality: N/A;   CARPAL TUNNEL RELEASE Left 05/12/2024   Procedure: CARPAL TUNNEL RELEASE;  Surgeon: Joshua Alm Hamilton, MD;  Location: [facility] OR;  Service: Neurosurgery;  Laterality: Left;   CERVICAL DISC SURGERY  99   LUMBAR FUSION     LUMBAR LAMINECTOMY/DECOMPRESSION MICRODISCECTOMY Bilateral 03/09/2021   Procedure: Laminectomy and Foraminotomy - bilateral - Lumbar two-Lumbar three;  Surgeon: Louis Shove, MD;  Location: [facility] OR;  Service: Neurosurgery;  Laterality: Bilateral;   ROTATOR CUFF REPAIR Bilateral    SHOULDER ARTHROSCOPY WITH ROTATOR CUFF REPAIR AND SUBACROMIAL DECOMPRESSION Right 12/30/2018   Procedure: SHOULDER ARTHROSCOPY WITH ROTATOR CUFF REPAIR AND SUBACROMIAL DECOMPRESSION, DISTAL CLAVICLE EXICISION,;  Surgeon: Cristy Bonner DASEN, MD;  Location: [facility];  Service: Orthopedics;  Laterality: Right;    SHOULDER ARTHROSCOPY WITH SUBACROMIAL DECOMPRESSION, ROTATOR CUFF REPAIR AND BICEP TENDON REPAIR Left 06/19/2019   Procedure: LEFT SHOULDER ARTHROSCOPY, EXTENTSIVE DEBRIDEMENT, DISTAL CLAVICULECTOMY, SUBACROMIAL DECOMPRESSION, PARTIAL ACROMIOPLASTY WITH CORACOACROMIAL RELEASE, ROTATOR CUFFF REPAIR AND BICEP TENODESIS;  Surgeon: Cristy Bonner DASEN, MD;  Location: [facility];  Service: Orthopedics;  Laterality: Left;   ULNAR NERVE TRANSPOSITION Left 05/12/2024   Procedure: ULNAR NERVE DECOMPRESSION/TRANSPOSITION;  Surgeon: Joshua Alm Hamilton, MD;  Location: [facility] OR;  Service: Neurosurgery;  Laterality: Left;  Carpel Tunnel Release - left and  - left ulnar nerve release at elbow    Objective:  Med list reviewed Current Medications[1]   Vital signs in last 24 hrs: There were no vitals filed for this visit. Wt Readings from Last 3 Encounters:  05/12/24 225 lb (102.1 kg)  05/07/24 226 lb 8 oz (102.7 kg)  04/30/24 226 lb (102.5 kg)    Physical Exam  *** HEENT: sclera anicteric, oral mucosa moist without lesions Neck: supple, no thyromegaly, JVD or lymphadenopathy Cardiac: ***,  no peripheral edema Pulm: clear to auscultation bilaterally, normal RR and effort noted Abdomen: soft, *** tenderness, with active bowel sounds. No guarding or palpable hepatosplenomegaly. Skin; warm and dry, no jaundice or rash  Labs:     Latest Ref Rng & Units 05/07/2024    9:01 AM 04/02/2024   11:31 AM 01/16/2024    1:59 PM  CBC  WBC 4.0 - 10.5 K/uL 8.1  8.9  9.2   Hemoglobin 13.0 - 17.0 g/dL 89.5  88.4  9.0   Hematocrit 39.0 - 52.0 % 35.1  37.4  30.8   Platelets 150 -  400 K/uL 387  461.0  674    Iron/TIBC/Ferritin/ %Sat    Component Value Date/Time   IRON 32 (L) 04/02/2024 1522   TIBC 515.2 (H) 04/02/2024 1522   FERRITIN 6.6 (L) 04/02/2024 1522   IRONPCTSAT 6.2 (L) 04/02/2024 1522    ___________________________________________ Radiologic  studies:   ____________________________________________ Other:   _____________________________________________ Assessment & Plan  Assessment: No diagnosis found.    Plan:   *** minutes were spent on this encounter (including chart review, history/exam, counseling/coordination of care, and documentation) > 50% of that time was spent on counseling and coordination of care.   Victory LITTIE Brand III     [1]  Current Outpatient Medications:    Accu-Chek Softclix Lancets lancets, Use 3 (three) times daily as directed to check blood sugar, Disp: 100 each, Rfl: 0   Blood Glucose Monitoring Suppl (BLOOD GLUCOSE MONITOR SYSTEM) w/Device KIT, Use 3 (three) times daily., Disp: 1 kit, Rfl: 0   Continuous Glucose Receiver (FREESTYLE LIBRE 3 READER) DEVI, Use 4 (four) times daily., Disp: 1 each, Rfl: 1   Continuous Glucose Sensor (FREESTYLE LIBRE 3 PLUS SENSOR) MISC, Change sensor every 15 days., Disp: 6 each, Rfl: 3   cyclobenzaprine  (FLEXERIL ) 10 MG tablet, Take 1 tablet (10 mg total) by mouth 3 (three) times daily as needed for muscle spasms., Disp: 30 tablet, Rfl: 0   empagliflozin  (JARDIANCE ) 25 MG TABS tablet, Take 1 tablet (25 mg total) by mouth daily., Disp: 90 tablet, Rfl: 3   esomeprazole  (NEXIUM ) 40 MG capsule, TAKE 1 CAPSULE BY MOUTH EVERY MORNING BEFORE BREAKFAST, Disp: 90 capsule, Rfl: 1   gabapentin  (NEURONTIN ) 300 MG capsule, Take 1 capsule (300 mg total) by mouth 3 (three) times daily., Disp: 270 capsule, Rfl: 3   insulin  glargine (LANTUS ) 100 UNIT/ML Solostar Pen, Inject 36 Units into the skin daily. May titrate up to 42 units daily as directed by your provider. (Patient taking differently: Inject 30 Units into the skin daily. May titrate up to 42 units daily as directed by your provider.), Disp: 15 mL, Rfl: 5   insulin  lispro (HUMALOG ) 100 UNIT/ML KwikPen, Inject 2 units + correction dose (per sliding scale) three times daily 15 minutes before each meal. (<150= 0 unit; BG 150-200= 2  unit; BG 201-250= 4 unit; BG 251-300= 6 unit; BG 301-350= 8 unit; BG 351-400= 10 unit; BG >400= 12 unit), Disp: 15 mL, Rfl: 3   Insulin  Pen Needle 32G X 4 MM MISC, Use 3 (three) times daily., Disp: 100 each, Rfl: 0   lisinopril -hydrochlorothiazide  (ZESTORETIC ) 20-25 MG tablet, Take 1 tablet by mouth daily., Disp: 90 tablet, Rfl: 3   oxyCODONE -acetaminophen  (PERCOCET) 10-325 MG tablet, Take 1 tablet by mouth every 4 (four) hours as needed for pain., Disp: 20 tablet, Rfl: 0   simvastatin  (ZOCOR ) 10 MG tablet, TAKE 1 TABLET(10 MG) BY MOUTH DAILY, Disp: 90 tablet, Rfl: 1

## 2024-05-28 ENCOUNTER — Ambulatory Visit: Payer: Self-pay

## 2024-05-28 ENCOUNTER — Other Ambulatory Visit: Payer: Self-pay | Admitting: Nurse Practitioner

## 2024-05-28 DIAGNOSIS — E119 Type 2 diabetes mellitus without complications: Secondary | ICD-10-CM

## 2024-05-28 MED ORDER — INSULIN LISPRO (1 UNIT DIAL) 100 UNIT/ML (KWIKPEN): PEN_INJECTOR | SUBCUTANEOUS | 3 refills | Status: AC

## 2024-05-28 MED ORDER — INSULIN PEN NEEDLE 32G X 4 MM MISC: 3 refills | Status: AC

## 2024-05-28 NOTE — Patient Instructions (Signed)
 It was nice to see you today!  Your goal blood sugar is 80-130 mg/dL before eating and less than 180 mg/dL after eating. Monitor blood sugars at home and keep a log (glucometer or piece of paper) to bring with you to your next visit.  Medication Changes:  Increase Lantus  to 36 units daily  Take Humalog  8 units 15 minutes before each meal. If you are just having a snack, you can take Humalog  4 units before eating.  Continue Jardiance  25 mg daily  I will call the pharmacy to make sure they can refill your simvastatin .  If you experience symptoms of a low blood sugar (dizzy, shaky, sweaty) check your blood sugar. If it is less than 70 mg/dL, you should eat or drink 15 grams of fast-acting carbohydrates like 4 glucose tablets, 1/2 cup of regular soda, or 1/2 cup of juice. Recheck your blood sugar after 15 minutes. If the level is still below 70 mg/dL repeat the process. If this occurs frequently, you should notify your healthcare provider.   Lifestyle Recommendations:  Aim for 150 minutes of moderate intensity exercise every week. This is any activity that elevates your heart rate and breathing rate, but still allows you to carry on a conversation. Exercising after meals can help prevent spikes in your blood sugar.  Diet Recommendations:   Try to eat 3 real meals using the healthy plate method and 1-2 snacks per day. Never go more than 4-5 hours while awake without eating. Eat breakfast within the first hour of getting up.     Carbohydrates include starch, sugar, and fiber. Sugar and starch raise blood glucose.  Starchy foods include bread, rice, pasta, potatoes, corn, cereal, grits, crackers, bagels, muffins, all baked foods Some fruits are higher in sugar, like grapes, watermelon, oranges, and most tropical fruits. Limit your serving size to 1/2 cup at a time.  Protein foods include meat, fish, poultry, eggs, dairy, and beans (although beans also provide carbohydrates).  Non-starchy  vegetables do not impact your blood sugar very much. These include greens, broccoli, asparagus, carrots, cauliflower, cucumber, mushrooms, peppers, yellow squash, zucchini squash, tomato, to name a few!  Avoid all sugary beverages. Stay hydrated with water  throughout the day. Sparkling/flavored water , unsweet tea, black coffee, and zero-sugar or diet drinks will not impact your blood sugar, but they should not be used as your only source of hydration!  You can find more information at https://diabetes.org/food-nutrition/eating-healthy   Riley Clarke, PharmD [facility] [phone]

## 2024-05-28 NOTE — Progress Notes (Signed)
 05/28/2024 Name: Riley Clarke MRN: [id number] DOB: 05/01/1956  Chief Complaint  Patient presents with   Diabetes    Riley Clarke is a 68 y.o. year old male who was referred for medication management by their primary care provider, Riley Bascom RAMAN, NP. They presented for a face to face visit today.   They were referred to the pharmacist by their PCP for assistance in managing diabetes, hypertension, and hyperlipidemia . PMH includes HTN, T2DM, BMI > 30, opioid dependence for chronic pain.   Subjective: Patient was seen by PCP, Riley Clarke, on 10/15/23. BP was 135/66, HR 90. Patient reported taking his medications as prescribed. A1C had increased from 8.1% to 10.3%. Patient was seen by pharmacy in person on 11/14/23. He could not identify a specific cause for his recent increase in A1C. He was instructed to stop linaglipitin, stop Trulicity , and start Mounjaro . He was instructed to continue metformin  and Jardiance . He was seen for follow-up in person on 12/26/23 and reported that he started Mounjaro  but stopped taking metformin  and Jardiance . His BG continued to be very elevated. He was instructed to restart his medications and continue titrating Mounjaro . He was then hospitalized at [facility] from 7/22 to 7/31 for DKA in the setting of GI illness (slalmonella, EPEC, ETEC+). At discharge, he was instructed to stop Jardiance , metformin , and Mounjaro  and start Lantus  30 units daily and Humalog  2 units TID with meals + SS. At PCP f/u on 01/16/24, A1C had increased to 11.7%. At pharmacy appt in person on 02/26/24, pt was taking Lantus  and Humalog  and had self-resumed Jardiance . He was not willing to restart Mounjaro . CGM demonstrated TIR of 21%, TAR 79%. We increased his Lantus  to 36 units (titrate up to 42) and increased Humalog  to 2 units with meals + sliding scale correction. At pharmacy appt on 03/25/24, patient reported continuing Lantus  30 units daily. Averaging Humalog  4-6 units with each meal. He was  instructed to adjust Humalog  sliding scale to provide at least 2 units with each meal PLUS extra per scale. A1C at PCP appt on 04/30/24 was 10.1%.   Today, patient reports doing ok. He is wanting rotator cuff surgery, but this cannot be done with current A1C. He is frustrated that sugars are staying high. Feels that Riley Clarke is not accurate. States he does not eat a lot, but later states he has high-carb snacks during the day. Occasional regular soda. He does not want to retry GLP-1RA.  Care Team: Primary Care Provider: [name], NP ; Next Scheduled Visit: 07/31/24  Medication Access/Adherence  Current Pharmacy:  [facility] [id number] - [place], [place] - [address] [place] [address] Phone: [phone] Fax: [phone]  [id number] [place], [place] - [address] AT [facility] [address] [place] [address] Phone: [phone] Fax: [phone]   Patient reports affordability concerns with their medications: No  - [employer] Dual Complete, meds are free Patient reports access/transportation concerns to their pharmacy: No  Patient reports adherence concerns with their medications:  No    Diabetes:  Current medications: Jardiance  25 mg daily, Lantus  30 units daily, Humalog  2 units TID with meals + SS (<150= 0 unit; BG 150-200= 1 unit; BG 201-250= 2 unit; BG 251-300= 3 unit; BG 301-350= 4 unit; BG 351-400= 5 unit; BG >400= 6 unit ) (averaging 6 units with each meal, takes before eating)  Medications tried in  the past: metformin  IR 1000 mg BID with meals,  Mounjaro  5 mg weekly (Stopped after hospitalization for GI illness and DKA)  Current glucose readings: Has FL3+ reader with him today Using Accu Chek Guide meter as backup but not using       Patient denies hypoglycemic s/sx including dizziness, shakiness, sweating.  Patient denies hyperglycemic symptoms including  polyuria, polydipsia, polyphagia, nocturia, blurred vision. Has stable neuropathy.  Current meal patterns: 3 meals per day - sometimes just having sandwich or ensure drink for a meal.  - Breakfast: bowl of cereal, bacon-egg sandwich,  - Supper: brown rice and chicken. Large portion of rice - Snacks: potato chips, popcorn - Drinks: sugar free lemonade, drinking 5-6x 16 oz water  bottle during the day, had soda, sweet tea recently   Current physical activity: occasionally goes out and walk  Current medication access support: none- [employer] Dual Complete  Hypertension:  Current medications: lisinopril -hydrochlorothiazide  20-25 mg daily Medications previously tried: n/a  Patient has a automated, upper arm home BP cuff - not checking regularly at home Current blood pressure readings readings: N/A  Patient denies hypotensive s/sx including dizziness, lightheadedness.  Patient denies hypertensive symptoms including headache, chest pain, shortness of breath   Hyperlipidemia/ASCVD Risk Reduction  Current lipid lowering medications: simvastatin  10 mg daily (requests refill from pharmacy) Medications tried in the past: N/A  Antiplatelet regimen: N/A  ASCVD History: N/A Family History: heart disease in mother Risk Factors: T2DM, HTN, family history   Clinical ASCVD: No  The ASCVD Risk score (Arnett DK, et al., 2019) failed to calculate for the following reasons:   The valid total cholesterol range is 130 to 320 mg/dL    Objective:  BP Readings from Last 3 Encounters:  05/12/24 (!) 145/86  05/07/24 128/69  04/30/24 131/68    Lab Results  Component Value Date   HGBA1C 10.1 (A) 04/30/2024   HGBA1C 11.7 (A) 01/16/2024   HGBA1C 10.3 (A) 10/15/2023    Lab Results  Component Value Date   CREATININE 0.89 04/30/2024   BUN 12 04/30/2024   NA 136 04/30/2024   K 4.3 04/30/2024   CL 100 04/30/2024   CO2 21 04/30/2024    Lab Results  Component Value Date   CHOL 93 (L) 10/15/2023    HDL 49 10/15/2023   LDLCALC 22 10/15/2023   TRIG 129 10/15/2023   CHOLHDL 1.9 10/15/2023    Medications Reviewed Today     Reviewed by Brinda Lorain SQUIBB, RPH-CPP (Pharmacist) on 05/28/24 at 1731  Med List Status: <None>   Medication Order Taking? Sig Documenting Provider Last Dose Status Informant  Accu-Chek Softclix Lancets lancets [id number]  Use 3 (three) times daily as directed to check blood sugar Amin, Burgess BROCKS, MD  Active Self, Pharmacy Records  Blood Glucose Monitoring Suppl (BLOOD GLUCOSE MONITOR SYSTEM) w/Device KIT [id number]  Use 3 (three) times daily. Caleen Burgess BROCKS, MD  Active Self, Pharmacy Records  Continuous Glucose Receiver (FREESTYLE LIBRE 3 READER) DEVI [id number]  Use 4 (four) times daily. Caleen Burgess BROCKS, MD  Active Self, Pharmacy Records  Continuous Glucose Sensor (FREESTYLE LIBRE 3 PLUS SENSOR) [place] [id number] Yes Change sensor every 15 days. Riley Bascom RAMAN, NP  Active Self, Pharmacy Records  cyclobenzaprine  (FLEXERIL ) 10 MG tablet [id number]  Take 1 tablet (10 mg total) by mouth 3 (three) times daily as needed for muscle spasms. Riley Bascom RAMAN, NP  Active Self, Pharmacy Records  empagliflozin  (JARDIANCE ) 25 MG TABS tablet [id number] Yes Take 1 tablet (25 mg  total) by mouth daily. Riley Bascom RAMAN, NP  Active Self, Pharmacy Records  esomeprazole  (NEXIUM ) 40 MG capsule [id number]  TAKE 1 CAPSULE BY MOUTH EVERY MORNING BEFORE BREAKFAST Nichols, Riley Clarke S, NP  Active   gabapentin  (NEURONTIN ) 300 MG capsule [id number]  Take 1 capsule (300 mg total) by mouth 3 (three) times daily. Riley Bascom RAMAN, NP  Active   insulin  glargine (LANTUS ) 100 UNIT/ML Solostar Pen [id number] Yes Inject 36 Units into the skin daily. May titrate up to 42 units daily as directed by your provider.  Patient taking differently: Inject 30 Units into the skin daily. May titrate up to 42 units daily as directed by your provider.   Riley Bascom RAMAN, NP  Active Self, Pharmacy Records           Med Note RUMALDO DIAMOND FORBES Pablo May 12, 2024 11:49 AM) 15 units   insulin  lispro (HUMALOG ) 100 UNIT/ML KwikPen [id number]  Inject 8 units three times daily before meals. Riley Bascom RAMAN, NP  Active   Insulin  Pen Needle 32G X 4 MM MISC [id number]  Use as directed to inject insulin  up to 4 times per day Riley Bascom RAMAN, NP  Active   lisinopril -hydrochlorothiazide  (ZESTORETIC ) 20-25 MG tablet [id number] Yes Take 1 tablet by mouth daily. Riley Bascom RAMAN, NP  Active Self, Pharmacy Records  oxyCODONE -acetaminophen  (PERCOCET) 10-325 MG tablet [id number]  Take 1 tablet by mouth every 4 (four) hours as needed for pain. Joshua Alm Hamilton, MD  Active   simvastatin  (ZOCOR ) 10 MG tablet [id number] Yes TAKE 1 TABLET(10 MG) BY MOUTH DAILY Nichols, Riley Clarke S, NP  Active Self, Pharmacy Records              Assessment/Plan:   Diabetes: - Currently uncontrolled with last A1C of 10.1% uncontrolled above goal < 7%. TIR uncontrolled at 28% below goal > 70%. Patient feels lack of BG control is primarily due to dietary excursion and that insulin  is not working. Will continue titration of both basal and prandial insulin . Patient is motivated to improve sugars given desire for ortho surgery. Patient continues to take Jardiance  without AE. Will closely monitor for s/sx of GU infection or worsening BG that would put him at risk for DKA. Patient does not want to retrial GLP-1RA or GLP-1/GIP RA at this time.  - Reviewed long term cardiovascular and renal outcomes of uncontrolled blood sugar - Reviewed goal A1c, goal fasting, and goal 2 hour post prandial glucose - Reviewed dietary modifications including minimizing carbohydrate food and drinks, maximizing protein intake with meals. Advised patient to look for food and drink (Ensure) options with less total carbohydrates.  - Recommend to increase Lantus  to 36 units daily. Recommend to increase by 2 units every 3 days if FBG is > 150 mg/dL up to a maximum of 42 units daily. - Recommend to  take Humalog  8 units 15 minutes prior to large meals up to three times daily. Advised that he can take 4 units if he is having a snack instead of a full meal - Recommend to continue Jardiance  25 mg daily - Recommend to check glucose continuously with FL3+ CGM and reader   Hypertension: - Currently controlled with clinic BP consistently below goal < 130/80 mmHg. Last BMP WNL. - Reviewed long term cardiovascular and renal outcomes of uncontrolled blood pressure - Reviewed appropriate blood pressure monitoring technique and reviewed goal blood pressure. Recommended to check home blood pressure and heart rate at least once per week.  -  Recommend to continue lisinopril -hydrochlorothiazide  20-25 mg daily    Hyperlipidemia/ASCVD Risk Reduction: - Currently controlled with LDL-C of 22 mg/dL. Patient does have an indication for higher intensity statin with T2DM + comorbidities, but he is tolerating this medication well and LDL-C is very low. Therefore, will continue current regimen at this time - Reviewed long term complications of uncontrolled cholesterol - Recommend to continue simvastatin  10 mg daily. Requested refill from pharmacy at patient's request.   Follow Up Plan:  PCP 07/31/24,  PharmD in person 07/01/24  Lorain Baseman, PharmD [facility] [phone]

## 2024-06-08 NOTE — Progress Notes (Unsigned)
 "   [facility] [phone]   [phone]  CONSULT NOTE  REFERRING PHYSICIAN: Camie Furbish PA-C  REASON FOR CONSULTATION:  Anemia  HPI Riley Clarke is a 68 y.o. male with past medical history significant for hypertension, diabetes, sepsis, and degenerative disc disease is referred to the clinic for anemia.   The patient has been following with gastroenterology.  The patient had a colonoscopy on 03/28/2023 that showed internal hemorrhoids and two 3 to 7 mm polyps.  They recommended a repeat colonoscopy in 10 years.  Hemorrhoid banding was recommended if hemorrhoidal bleeding persisted.  He was having recurrent hemorrhoid bleeding and he is scheduled for a banding procedure on 06/20/24 by Dr. Legrand.   They noted that the patient was hospitalized in July for DKA and was also found to have Salmonella on GI pathogen panel which was treated.  During the admission, the patient was noted to have some anemia.  They repeated labs which showed mild microcytic anemia with a hemoglobin of 11.5.  Iron study showed iron deficiency with a ferritin low at 6.6, low saturation at 6.2, low iron at 32, and elevated TIBC of 515.2.  The patient was referred to the clinic for consideration of IV iron infusions.    They also recommended that he start taking ferrous sulfate 325 mg daily. He states he is not able to tolerate this due to significant constipation.   His B12 was also on the low end of normal and he was instructed to take 1000 to 2000 mcg daily. He is compliant with this.   GI was also considering EGD.   Overall, he is not very symptomatic. He denies significant fatigue, exertional dyspnea, lightheadedness, syncope, fevers, chills, lymphadenopathy, easy bruising, abdominal pain, or renal symptoms. He denies visible bleeding, including epistaxis, gingival bleeding, hemoptysis, or hematochezia, and is unaware of any bleeding from his hemorrhoids. He experiences prolapsed  hemorrhoids requiring manual reduction, which are uncomfortable but not painful or bleeding per patient report today. He notes new pagophagia, specifically craving ice in his water .  He denies prior knowledge of anemia.   The oldest records available to me are from 2014. It does appear the anemia was new and started during his July hospital admission. The Hbg upon admission was 12.4. with mild microcytosis with MCV of 78. The lowest Hbg during the admission was 8.9.    He has never required an iron infusion or blood transfusion before.   Family history consist of a mother with diabetes and possibly heart disease.  The patient is not sure about his father's medical problems but reports he had diabetes.  The patient is currently on disability due to back pain.  He is single.  He has 3 children.  He is a former smoker having smoked 6 years ago.  He is not able to quantify his pack-year history.   Past Medical History:  Diagnosis Date   Arthritis    Chronic back pain    on chronic opioids   Diabetes mellitus without complication (HCC)    GERD (gastroesophageal reflux disease)    HLD (hyperlipidemia)    Hypertension    no meds   Vitamin D  deficiency     Past Surgical History:  Procedure Laterality Date   ANTERIOR CERVICAL DECOMP/DISCECTOMY FUSION N/A 09/05/2022   Procedure: Anterior Cervical Decompression Fusion  - Cervical three-Cervical four;  Surgeon: Louis Shove, MD;  Location: [facility] OR;  Service: Neurosurgery;  Laterality: N/A;   CARPAL TUNNEL RELEASE  Left 05/12/2024   Procedure: CARPAL TUNNEL RELEASE;  Surgeon: Joshua Alm Hamilton, MD;  Location: [facility] OR;  Service: Neurosurgery;  Laterality: Left;   CERVICAL DISC SURGERY  99   LUMBAR FUSION     LUMBAR LAMINECTOMY/DECOMPRESSION MICRODISCECTOMY Bilateral 03/09/2021   Procedure: Laminectomy and Foraminotomy - bilateral - Lumbar two-Lumbar three;  Surgeon: Louis Shove, MD;  Location: [facility] OR;  Service: Neurosurgery;  Laterality: Bilateral;    ROTATOR CUFF REPAIR Bilateral    SHOULDER ARTHROSCOPY WITH ROTATOR CUFF REPAIR AND SUBACROMIAL DECOMPRESSION Right 12/30/2018   Procedure: SHOULDER ARTHROSCOPY WITH ROTATOR CUFF REPAIR AND SUBACROMIAL DECOMPRESSION, DISTAL CLAVICLE EXICISION,;  Surgeon: Cristy Bonner DASEN, MD;  Location: [facility];  Service: Orthopedics;  Laterality: Right;   SHOULDER ARTHROSCOPY WITH SUBACROMIAL DECOMPRESSION, ROTATOR CUFF REPAIR AND BICEP TENDON REPAIR Left 06/19/2019   Procedure: LEFT SHOULDER ARTHROSCOPY, EXTENTSIVE DEBRIDEMENT, DISTAL CLAVICULECTOMY, SUBACROMIAL DECOMPRESSION, PARTIAL ACROMIOPLASTY WITH CORACOACROMIAL RELEASE, ROTATOR CUFFF REPAIR AND BICEP TENODESIS;  Surgeon: Cristy Bonner DASEN, MD;  Location: [facility];  Service: Orthopedics;  Laterality: Left;   ULNAR NERVE TRANSPOSITION Left 05/12/2024   Procedure: ULNAR NERVE DECOMPRESSION/TRANSPOSITION;  Surgeon: Joshua Alm Hamilton, MD;  Location: [facility] OR;  Service: Neurosurgery;  Laterality: Left;  Carpel Tunnel Release - left and  - left ulnar nerve release at elbow    Family History  Problem Relation Age of Onset   Heart disease Mother    Diabetes Mother    Amblyopia Neg Hx    Blindness Neg Hx    Cataracts Neg Hx    Hypertension Neg Hx    Macular degeneration Neg Hx    Retinal detachment Neg Hx    Strabismus Neg Hx    Esophageal cancer Neg Hx    Colon cancer Neg Hx    Pancreatic cancer Neg Hx    Ulcerative colitis Neg Hx    Inflammatory bowel disease Neg Hx    Liver disease Neg Hx    Rectal cancer Neg Hx    Stomach cancer Neg Hx     Social History Social History[1]  Allergies[2]  Current Outpatient Medications  Medication Sig Dispense Refill   Accu-Chek Softclix Lancets lancets Use 3 (three) times daily as directed to check blood sugar 100 each 0   Blood Glucose Monitoring Suppl (BLOOD GLUCOSE MONITOR SYSTEM) w/Device KIT Use 3 (three) times daily. 1 kit 0   Continuous Glucose Receiver (FREESTYLE LIBRE 3 READER)  DEVI Use 4 (four) times daily. 1 each 1   Continuous Glucose Sensor (FREESTYLE LIBRE 3 PLUS SENSOR) MISC Change sensor every 15 days. 6 each 3   cyclobenzaprine  (FLEXERIL ) 10 MG tablet Take 1 tablet (10 mg total) by mouth 3 (three) times daily as needed for muscle spasms. 30 tablet 0   empagliflozin  (JARDIANCE ) 25 MG TABS tablet Take 1 tablet (25 mg total) by mouth daily. 90 tablet 3   esomeprazole  (NEXIUM ) 40 MG capsule TAKE 1 CAPSULE BY MOUTH EVERY MORNING BEFORE BREAKFAST 90 capsule 1   gabapentin  (NEURONTIN ) 300 MG capsule Take 1 capsule (300 mg total) by mouth 3 (three) times daily. 270 capsule 3   insulin  glargine (LANTUS ) 100 UNIT/ML Solostar Pen Inject 36 Units into the skin daily. May titrate up to 42 units daily as directed by your provider. (Patient taking differently: Inject 30 Units into the skin daily. May titrate up to 42 units daily as directed by your provider.) 15 mL 5   insulin  lispro (HUMALOG ) 100 UNIT/ML KwikPen Inject 8 units three times daily  before meals. 15 mL 3   Insulin  Pen Needle 32G X 4 MM MISC Use as directed to inject insulin  up to 4 times per day 300 each 3   lisinopril -hydrochlorothiazide  (ZESTORETIC ) 20-25 MG tablet Take 1 tablet by mouth daily. 90 tablet 3   oxyCODONE -acetaminophen  (PERCOCET) 10-325 MG tablet Take 1 tablet by mouth every 4 (four) hours as needed for pain. 20 tablet 0   simvastatin  (ZOCOR ) 10 MG tablet TAKE 1 TABLET(10 MG) BY MOUTH DAILY 90 tablet 1   No current facility-administered medications for this visit.    REVIEW OF SYSTEMS:   Review of Systems  Constitutional: Negative for appetite change, chills, fatigue, fever and unexpected weight change.  HENT: Negative for mouth sores, nosebleeds, sore throat and trouble swallowing.   Eyes: Negative for eye problems and icterus.  Respiratory: Negative for cough, hemoptysis, shortness of breath and wheezing.   Cardiovascular: Negative for chest pain and leg swelling.  Gastrointestinal: Negative  for abdominal pain, constipation, diarrhea, nausea and vomiting.  Genitourinary: Negative for bladder incontinence, difficulty urinating, dysuria, frequency and hematuria.   Musculoskeletal: Negative for back pain, gait problem, neck pain and neck stiffness.  Skin: Negative for itching and rash.  Neurological: Negative for dizziness, extremity weakness, gait problem, headaches, light-headedness and seizures.  Hematological: Negative for adenopathy. Does not bruise/bleed easily.  Psychiatric/Behavioral: Negative for confusion, depression and sleep disturbance. The patient is not nervous/anxious.     PHYSICAL EXAMINATION:  Blood pressure 123/62, pulse 96, temperature (!) 97.3 F (36.3 C), temperature source Temporal, resp. rate 17, height 6' (1.829 m), weight 222 lb (100.7 kg), SpO2 99%.  ECOG PERFORMANCE STATUS: 1  Physical Exam  Constitutional: Oriented to person, place, and time and well-developed, well-nourished, and in no distress.  HENT:  Head: Normocephalic and atraumatic.  Mouth/Throat: Oropharynx is clear and moist. No oropharyngeal exudate.  Eyes: Conjunctivae are normal. Right eye exhibits no discharge. Left eye exhibits no discharge. No scleral icterus.  Neck: Normal range of motion. Neck supple.  Cardiovascular: Normal rate, regular rhythm, normal heart sounds and intact distal pulses.   Pulmonary/Chest: Effort normal and breath sounds normal. No respiratory distress. No wheezes. No rales.  Abdominal: Soft. Bowel sounds are normal. Exhibits no distension and no mass. There is no tenderness.  Musculoskeletal: Normal range of motion. Exhibits no edema.  Lymphadenopathy:    No cervical adenopathy.  Neurological: Alert and oriented to person, place, and time. Exhibits normal muscle tone. Gait normal. Coordination normal.  Skin: Skin is warm and dry. No rash noted. Not diaphoretic. No erythema. No pallor.  Psychiatric: Mood, memory and judgment normal.  Vitals  reviewed.  LABORATORY DATA: Lab Results  Component Value Date   WBC 8.2 06/10/2024   HGB 11.7 (L) 06/10/2024   HCT 37.7 (L) 06/10/2024   MCV 68.5 (L) 06/10/2024   PLT 342 06/10/2024      Chemistry      Component Value Date/Time   NA 136 06/10/2024 1243   NA 136 04/30/2024 1430   K 3.9 06/10/2024 1243   CL 100 06/10/2024 1243   CO2 23 06/10/2024 1243   BUN 15 06/10/2024 1243   BUN 12 04/30/2024 1430   CREATININE 0.87 06/10/2024 1243   CREATININE 0.86 01/15/2017 1501      Component Value Date/Time   CALCIUM  9.2 06/10/2024 1243   ALKPHOS 138 (H) 06/10/2024 1243   AST 20 06/10/2024 1243   ALT 16 06/10/2024 1243   BILITOT 0.3 06/10/2024 1243  RADIOGRAPHIC STUDIES: No results found.  ASSESSMENT: This is a very pleasant 68 year old African American male referred to the clinic for iron deficiency anemia.   He has several lab studies performed today including a CBC, CMP, iron studies, ferritin, B12, folate, and SPEP.    His labs today show microcytic anemia with a hemoglobin of 11.7 and low MCV of 68.5.  Her iron studies show low iron at 38 and low saturation at 8%.    We will arrange for IV iron infusions of Venofer 200 mg weekly x 5.  We will arrange for this at the [facility] infusion center.    I put information on his AVS with iron rich food to increase in his diet.    We will see her back for labs and a follow-up visit in 3 months.  He will continue to follow with GI for hemorrhoids banding and possible EGD.   The patient voices understanding of current disease status and treatment options and is in agreement with the current care plan.  All questions were answered. The patient knows to call the clinic with any problems, questions or concerns. We can certainly see the patient much sooner if necessary.  Thank you so much for allowing me to participate in the care of Riley Clarke. I will continue to follow up the patient with you and assist in his  care.   Disclaimer: This note was dictated with voice recognition software. Similar sounding words can inadvertently be transcribed and may not be corrected upon review.   Riley Clarke June 10, 2024, 2:28 PM  ADDENDUM: Hematology/Oncology Attending:  I had a face-to-face encounter with the patient today.  I reviewed his record, lab and recommended his care plan.  This is a very pleasant 68 years old African-American male with history of hypertension, diabetes mellitus, degenerative disc disease and history of sepsis.  The patient has a history of anemia that has been going on for several years likely secondary to internal hemorrhoids in addition to colon polyps.  He is followed by gastroenterology and had colonoscopy on March 28, 2023.  He was supposed to have hemorrhoidal banding on June 20, 2024. The patient has intolerance to oral iron tablets with upset stomach.  His most recent blood work showed persistent microcytic anemia with hemoglobin of 11.5 and iron level of 32 with iron saturation of 6.2 and ferritin of 6.6. He was referred to us  for consideration of IV iron infusion. When seen today he continues to complain of fatigue and weakness.  Repeat CBC today showed hemoglobin of 11.7 and hematocrit 37.7 with MCV of 68.5.  He has normal platelet count as well as white blood count and platelet count.  Ferritin was low at 16.  Serum iron was 38 with iron saturation of 8%.  He has normal serum folate and elevated vitamin B12 level.  Serum protein electrophoresis is still pending.  I recommended for the patient to proceed with iron infusion with Venofer at the [facility] infusion center. Will arrange for the patient a follow-up appointment in 3 months for evaluation and repeat CBC, iron study and ferritin. He was advised to call immediately if he has any other concerning symptoms in the interval. Disclaimer: This note was dictated with voice recognition software. Similar  sounding words can inadvertently be transcribed and may be missed upon review. Sherrod MARLA Sherrod, MD       [1]  Social History Tobacco Use   Smoking status: Former  Current packs/day: 0.00    Average packs/day: 0.3 packs/day for 25.0 years (6.3 ttl pk-yrs)    Types: Cigarettes    Start date: 04/27/1995    Quit date: 04/26/2020    Years since quitting: 4.1   Smokeless tobacco: Never  Vaping Use   Vaping status: Never Used  Substance Use Topics   Alcohol use: Yes    Alcohol/week: 4.0 standard drinks of alcohol    Types: 4 Cans of beer per week    Comment: twice a week   Drug use: No  [2]  Allergies Allergen Reactions   Cymbalta [Duloxetine Hcl] Nausea And Vomiting and Other (See Comments)    Dizziness Headaches    Shrimp [Shellfish Allergy] Nausea And Vomiting   Valium  [Diazepam ] Itching   "

## 2024-06-10 ENCOUNTER — Telehealth (HOSPITAL_COMMUNITY): Payer: Self-pay | Admitting: Pharmacy Technician

## 2024-06-10 ENCOUNTER — Inpatient Hospital Stay

## 2024-06-10 ENCOUNTER — Other Ambulatory Visit: Payer: Self-pay | Admitting: Physician Assistant

## 2024-06-10 ENCOUNTER — Inpatient Hospital Stay: Attending: Physician Assistant | Admitting: Physician Assistant

## 2024-06-10 VITALS — BP 123/62 | HR 96 | Temp 97.3°F | Resp 17 | Ht 72.0 in | Wt 222.0 lb

## 2024-06-10 DIAGNOSIS — K5900 Constipation, unspecified: Secondary | ICD-10-CM | POA: Insufficient documentation

## 2024-06-10 DIAGNOSIS — I10 Essential (primary) hypertension: Secondary | ICD-10-CM | POA: Insufficient documentation

## 2024-06-10 DIAGNOSIS — D509 Iron deficiency anemia, unspecified: Secondary | ICD-10-CM

## 2024-06-10 DIAGNOSIS — E559 Vitamin D deficiency, unspecified: Secondary | ICD-10-CM | POA: Insufficient documentation

## 2024-06-10 DIAGNOSIS — Z87898 Personal history of other specified conditions: Secondary | ICD-10-CM | POA: Diagnosis not present

## 2024-06-10 DIAGNOSIS — Z8719 Personal history of other diseases of the digestive system: Secondary | ICD-10-CM | POA: Diagnosis not present

## 2024-06-10 DIAGNOSIS — R531 Weakness: Secondary | ICD-10-CM | POA: Insufficient documentation

## 2024-06-10 DIAGNOSIS — K648 Other hemorrhoids: Secondary | ICD-10-CM | POA: Diagnosis not present

## 2024-06-10 DIAGNOSIS — E119 Type 2 diabetes mellitus without complications: Secondary | ICD-10-CM | POA: Insufficient documentation

## 2024-06-10 DIAGNOSIS — R5383 Other fatigue: Secondary | ICD-10-CM | POA: Insufficient documentation

## 2024-06-10 DIAGNOSIS — E785 Hyperlipidemia, unspecified: Secondary | ICD-10-CM | POA: Insufficient documentation

## 2024-06-10 LAB — VITAMIN B12: Vitamin B-12: 1012 pg/mL — ABNORMAL HIGH (ref 180–914)

## 2024-06-10 LAB — CBC WITH DIFFERENTIAL (CANCER CENTER ONLY)
Abs Immature Granulocytes: 0.03 K/uL (ref 0.00–0.07)
Basophils Absolute: 0.1 K/uL (ref 0.0–0.1)
Basophils Relative: 1 %
Eosinophils Absolute: 0.1 K/uL (ref 0.0–0.5)
Eosinophils Relative: 1 %
HCT: 37.7 % — ABNORMAL LOW (ref 39.0–52.0)
Hemoglobin: 11.7 g/dL — ABNORMAL LOW (ref 13.0–17.0)
Immature Granulocytes: 0 %
Lymphocytes Relative: 23 %
Lymphs Abs: 1.9 K/uL (ref 0.7–4.0)
MCH: 21.3 pg — ABNORMAL LOW (ref 26.0–34.0)
MCHC: 31 g/dL (ref 30.0–36.0)
MCV: 68.5 fL — ABNORMAL LOW (ref 80.0–100.0)
Monocytes Absolute: 0.6 K/uL (ref 0.1–1.0)
Monocytes Relative: 7 %
Neutro Abs: 5.6 K/uL (ref 1.7–7.7)
Neutrophils Relative %: 68 %
Platelet Count: 342 K/uL (ref 150–400)
RBC: 5.5 MIL/uL (ref 4.22–5.81)
RDW: 19.5 % — ABNORMAL HIGH (ref 11.5–15.5)
WBC Count: 8.2 K/uL (ref 4.0–10.5)
nRBC: 0 % (ref 0.0–0.2)

## 2024-06-10 LAB — CMP (CANCER CENTER ONLY)
ALT: 16 U/L (ref 0–44)
AST: 20 U/L (ref 15–41)
Albumin: 4.5 g/dL (ref 3.5–5.0)
Alkaline Phosphatase: 138 U/L — ABNORMAL HIGH (ref 38–126)
Anion gap: 13 (ref 5–15)
BUN: 15 mg/dL (ref 8–23)
CO2: 23 mmol/L (ref 22–32)
Calcium: 9.2 mg/dL (ref 8.9–10.3)
Chloride: 100 mmol/L (ref 98–111)
Creatinine: 0.87 mg/dL (ref 0.61–1.24)
GFR, Estimated: >60 mL/min
Glucose, Bld: 232 mg/dL — ABNORMAL HIGH (ref 70–99)
Potassium: 3.9 mmol/L (ref 3.5–5.1)
Sodium: 136 mmol/L (ref 135–145)
Total Bilirubin: 0.3 mg/dL (ref 0.0–1.2)
Total Protein: 7.5 g/dL (ref 6.5–8.1)

## 2024-06-10 LAB — IRON AND IRON BINDING CAPACITY (CC-WL,HP ONLY)
Iron: 38 ug/dL — ABNORMAL LOW (ref 45–182)
Saturation Ratios: 8 % — ABNORMAL LOW (ref 17.9–39.5)
TIBC: 486 ug/dL — ABNORMAL HIGH (ref 250–450)
UIBC: 448 ug/dL

## 2024-06-10 LAB — FERRITIN: Ferritin: 16 ng/mL — ABNORMAL LOW (ref 24–336)

## 2024-06-10 LAB — FOLATE: Folate: 17.2 ng/mL

## 2024-06-10 NOTE — Telephone Encounter (Signed)
 Auth Submission: NO AUTH NEEDED Site of care: [facility] Payer: [employer] MEDICARE Medication & CPT/J Code(s) submitted: Venofer (Iron Sucrose) J1756 Diagnosis Code: D50.9 Route of submission (phone, fax, portal):  Phone # Fax # Auth type: Buy/Bill HB Units/visits requested: 200MG  X 5 DOSES Reference number: [id number] Approval from: 06/10/2024 to 08/10/24    Dagoberto Armour, CPhT [facility] Phone: [phone] 06/10/2024

## 2024-06-13 LAB — PROTEIN ELECTROPHORESIS, SERUM, WITH REFLEX
A/G Ratio: 1 (ref 0.7–1.7)
Albumin ELP: 3.6 g/dL (ref 2.9–4.4)
Alpha-1-Globulin: 0.2 g/dL (ref 0.0–0.4)
Alpha-2-Globulin: 0.9 g/dL (ref 0.4–1.0)
Beta Globulin: 1.4 g/dL — ABNORMAL HIGH (ref 0.7–1.3)
Gamma Globulin: 1 g/dL (ref 0.4–1.8)
Globulin, Total: 3.5 g/dL (ref 2.2–3.9)
Total Protein ELP: 7.1 g/dL (ref 6.0–8.5)

## 2024-06-15 ENCOUNTER — Other Ambulatory Visit: Payer: Self-pay | Admitting: Physician Assistant

## 2024-06-16 ENCOUNTER — Encounter (HOSPITAL_COMMUNITY): Payer: Self-pay | Admitting: Physician Assistant

## 2024-06-18 ENCOUNTER — Other Ambulatory Visit: Payer: Self-pay

## 2024-06-18 ENCOUNTER — Emergency Department (HOSPITAL_BASED_OUTPATIENT_CLINIC_OR_DEPARTMENT_OTHER)
Admission: EM | Admit: 2024-06-18 | Discharge: 2024-06-18 | Disposition: A | Discharge status: Home / Self Care | Attending: Emergency Medicine | Admitting: Emergency Medicine

## 2024-06-18 DIAGNOSIS — E119 Type 2 diabetes mellitus without complications: Secondary | ICD-10-CM | POA: Diagnosis not present

## 2024-06-18 DIAGNOSIS — Z79899 Other long term (current) drug therapy: Secondary | ICD-10-CM | POA: Diagnosis not present

## 2024-06-18 DIAGNOSIS — Z794 Long term (current) use of insulin: Secondary | ICD-10-CM | POA: Insufficient documentation

## 2024-06-18 DIAGNOSIS — Z7984 Long term (current) use of oral hypoglycemic drugs: Secondary | ICD-10-CM | POA: Insufficient documentation

## 2024-06-18 DIAGNOSIS — R21 Rash and other nonspecific skin eruption: Secondary | ICD-10-CM | POA: Insufficient documentation

## 2024-06-18 DIAGNOSIS — I10 Essential (primary) hypertension: Secondary | ICD-10-CM | POA: Diagnosis not present

## 2024-06-18 MED ORDER — FAMOTIDINE 20 MG PO TABS
20.0000 mg | ORAL_TABLET | Freq: Once | ORAL | Status: AC
  Administered 2024-06-18: 20 mg via ORAL
  Filled 2024-06-18: qty 1

## 2024-06-18 MED ORDER — FAMOTIDINE 20 MG PO TABS: 20.0000 mg | ORAL_TABLET | Freq: Two times a day (BID) | ORAL | 0 refills | Status: AC

## 2024-06-18 NOTE — Discharge Instructions (Signed)
 Today was overall reassuring.  As we discussed recommend taking Pepcid  for the next 5 days.  Please follow-up your PCP.  If you have worsening rash, have trouble breathing or throat closing sensation or any other concerning symptom please return to the ED for further evaluation.

## 2024-06-18 NOTE — ED Provider Notes (Signed)
 " [facility] EMERGENCY DEPARTMENT AT [facility] Provider Note   CSN: [id number] Arrival date & time: 06/18/24  1206     Patient presents with: Rash  HPI Riley Clarke is a 69 y.o. male presenting for rash.  He states he received a steroid shot last week and shortly after had a rash that he describes as welts all over my body.  He he reports a rash over his, arms and legs.  It was itchy.  Denies any trouble breathing, scratchy throat or wheezing.  He states at this time he feels that the rash may have resolved.  Did take Benadryl  prior to arrival.  Past Medical History:  Diagnosis Date   Arthritis    Chronic back pain    on chronic opioids   Diabetes mellitus without complication (HCC)    GERD (gastroesophageal reflux disease)    HLD (hyperlipidemia)    Hypertension    no meds   Vitamin D  deficiency        Rash      Prior to Admission medications  Medication Sig Start Date End Date Taking? Authorizing Provider  famotidine  (PEPCID ) 20 MG tablet Take 1 tablet (20 mg total) by mouth 2 (two) times daily. 06/18/24 06/23/24 Yes Lang Norleen POUR, PA-C  Accu-Chek Softclix Lancets lancets Use 3 (three) times daily as directed to check blood sugar 01/10/24   Amin, Ankit C, MD  Blood Glucose Monitoring Suppl (BLOOD GLUCOSE MONITOR SYSTEM) w/Device KIT Use 3 (three) times daily. 01/10/24   Amin, Ankit C, MD  Continuous Glucose Receiver (FREESTYLE LIBRE 3 READER) DEVI Use 4 (four) times daily. 01/10/24   Amin, Ankit C, MD  Continuous Glucose Sensor (FREESTYLE LIBRE 3 PLUS SENSOR) MISC Change sensor every 15 days. 02/26/24   [name], NP  cyclobenzaprine  (FLEXERIL ) 10 MG tablet Take 1 tablet (10 mg total) by mouth 3 (three) times daily as needed for muscle spasms. 04/17/24   [name], NP  empagliflozin  (JARDIANCE ) 25 MG TABS tablet Take 1 tablet (25 mg total) by mouth daily. 02/26/24   [name], NP  esomeprazole  (NEXIUM ) 40 MG capsule TAKE 1 CAPSULE BY MOUTH  EVERY MORNING BEFORE BREAKFAST 05/06/24   [name], NP  gabapentin  (NEURONTIN ) 300 MG capsule Take 1 capsule (300 mg total) by mouth 3 (three) times daily. 05/12/24   [name], NP  insulin  glargine (LANTUS ) 100 UNIT/ML Solostar Pen Inject 36 Units into the skin daily. May titrate up to 42 units daily as directed by your provider. Patient taking differently: Inject 30 Units into the skin daily. May titrate up to 42 units daily as directed by your provider. 02/26/24   [name], NP  insulin  lispro (HUMALOG ) 100 UNIT/ML KwikPen Inject 8 units three times daily before meals. 05/28/24   [name], NP  Insulin  Pen Needle 32G X 4 MM MISC Use as directed to inject insulin  up to 4 times per day 05/28/24   [name], NP  lisinopril -hydrochlorothiazide  (ZESTORETIC ) 20-25 MG tablet Take 1 tablet by mouth daily. 02/26/24 02/25/25  [name], NP  oxyCODONE -acetaminophen  (PERCOCET) 10-325 MG tablet Take 1 tablet by mouth every 4 (four) hours as needed for pain. 05/12/24   Joshua Alm Hamilton, MD  simvastatin  (ZOCOR ) 10 MG tablet TAKE 1 TABLET(10 MG) BY MOUTH DAILY 06/02/24   Nichols, [name] S, NP    Allergies: Cymbalta [duloxetine hcl], Shrimp [shellfish allergy], and Valium  [diazepam ]    Review of Systems  Skin:  Positive for rash.    Physical Exam   Vitals:   06/18/24 1216  BP: (!) 135/108  Pulse: 99  Resp: 17  Temp: 98.6 F (37 C)  SpO2: 99%    CONSTITUTIONAL:  well-appearing, NAD NEURO:  Alert and oriented x 3, CN 3-12 grossly intact EYES:  eyes equal and reactive ENT/NECK:  Supple, no stridor  CARDIO:  regular rate and rhythm, appears well-perfused  PULM:  No respiratory distress, CTAB GI/GU:  non-distended, soft MSK/SPINE:  No gross deformities, no edema, moves all extremities  SKIN:  no rash, atraumatic  *Additional and/or pertinent findings included in MDM below  (all labs ordered are listed, but only abnormal results are displayed) Labs Reviewed  - No data to display  EKG: None  Radiology: No results found.   Procedures   Medications Ordered in the ED  famotidine  (PEPCID ) tablet 20 mg (has no administration in time range)                                    Medical Decision Making  69 year old well-appearing male presenting for rash.  Exam was unremarkable and there was no identifiable rash on exam and there was no evidence of respiratory distress.  He looks well, vitals reassuring that his blood pressure was slightly elevated and in no acute distress.  It is possible he may have had an adverse reaction to the steroid.  Recommended 5 days of Pepcid  and then to take Benadryl  as needed at home and to follow-up with his PCP.  Did discuss return precautions should his symptoms worsen.  Discharged good condition.     Final diagnoses:  Rash    ED Discharge Orders          Ordered    famotidine  (PEPCID ) 20 MG tablet  2 times daily        06/18/24 1616               Shauntay Brunelli K, PA-C 06/18/24 1616    Dreama Longs, MD 06/19/24 1221  "

## 2024-06-18 NOTE — ED Triage Notes (Signed)
 Pt reports rash he believes may be in reaction to a steroid shot he received last week.  Pt states rashing is diffuse over torso arms and legs and itchy.  No fever at home, denies swelling, nausea or vomiting.  Pt has taken benadryl at home with some relief but rash and itching persist.  AAOx4, NAD in triage.

## 2024-06-20 ENCOUNTER — Ambulatory Visit: Admitting: Gastroenterology

## 2024-06-20 ENCOUNTER — Encounter: Payer: Self-pay | Admitting: Gastroenterology

## 2024-06-20 VITALS — BP 124/78 | HR 83 | Ht 72.0 in | Wt 224.1 lb

## 2024-06-20 DIAGNOSIS — K648 Other hemorrhoids: Secondary | ICD-10-CM

## 2024-06-20 DIAGNOSIS — K641 Second degree hemorrhoids: Secondary | ICD-10-CM

## 2024-06-20 NOTE — Progress Notes (Signed)
 Patient of Dr. Wilhelmenia with episodic hemorrhoidal bleeding and iron  deficiency anemia here today for his first session of hemorrhoidal banding.  Chart review performed, last colonoscopy report reviewed  Discussed hemorrhoidal banding with him including with a diagram of the anatomy.  Risks and benefits were reviewed and he was agreeable.  PROCEDURE NOTE: The patient presents with symptomatic grade 2  hemorrhoids, requesting rubber band ligation of his/her hemorrhoidal disease.  All risks, benefits and alternative forms of therapy were described and informed consent was obtained.  DRE revealed: Normal Anoscopy revealed swollen internal hemorrhoidal plexus with RP and RA columns (ranging from about the 11:00 to 1:00 positions) and smaller left lateral column  The anorectum was pre-medicated with 0.125% NTG and lubricant. The decision was made to band the RP internal hemorrhoids, and the [facility] ORegan System was used to perform band ligation without complication.  Digital anorectal examination was then performed to assure proper positioning of the band, and to adjust the banded tissue as required.  The patient was discharged home without pain or other issues.  Dietary and behavioral recommendations were given and along with follow-up instructions.     The following adjunctive treatments were recommended:  None  The patient will return in 4 weeks for  follow-up and possible additional banding as required. No complications were encountered and the patient tolerated the procedure well.  GLENWOOD Victory Brand, MD

## 2024-06-20 NOTE — Patient Instructions (Signed)

## 2024-06-23 ENCOUNTER — Encounter (HOSPITAL_COMMUNITY)
Admission: RE | Admit: 2024-06-23 | Discharge: 2024-06-23 | Disposition: A | Discharge status: Home / Self Care | Source: Ambulatory Visit | Attending: Physician Assistant | Admitting: Physician Assistant

## 2024-06-23 VITALS — BP 122/65 | HR 76 | Temp 98.4°F | Resp 15

## 2024-06-23 DIAGNOSIS — D509 Iron deficiency anemia, unspecified: Secondary | ICD-10-CM | POA: Insufficient documentation

## 2024-06-23 MED ORDER — DIPHENHYDRAMINE HCL 25 MG PO CAPS
25.0000 mg | ORAL_CAPSULE | Freq: Once | ORAL | Status: AC
  Administered 2024-06-23: 25 mg via ORAL

## 2024-06-23 MED ORDER — IRON SUCROSE 200 MG IVPB - SIMPLE MED
200.0000 mg | Freq: Once | Status: AC
  Administered 2024-06-23: 200 mg via INTRAVENOUS

## 2024-06-23 MED ORDER — ACETAMINOPHEN 325 MG PO TABS
650.0000 mg | ORAL_TABLET | Freq: Once | ORAL | Status: AC
  Administered 2024-06-23: 650 mg via ORAL

## 2024-06-23 MED ORDER — IRON SUCROSE 200 MG IVPB - SIMPLE MED
Status: AC
  Filled 2024-06-23: qty 110

## 2024-06-23 MED ORDER — ACETAMINOPHEN 325 MG PO TABS
ORAL_TABLET | ORAL | Status: AC
  Filled 2024-06-23: qty 2

## 2024-06-23 MED ORDER — DIPHENHYDRAMINE HCL 25 MG PO CAPS
ORAL_CAPSULE | ORAL | Status: AC
  Filled 2024-06-23: qty 1

## 2024-06-25 ENCOUNTER — Encounter (HOSPITAL_COMMUNITY)
Admission: RE | Admit: 2024-06-25 | Discharge: 2024-06-25 | Disposition: A | Discharge status: Home / Self Care | Source: Ambulatory Visit | Attending: Physician Assistant | Admitting: Physician Assistant

## 2024-06-25 VITALS — BP 129/64 | HR 82 | Temp 97.7°F | Resp 15

## 2024-06-25 DIAGNOSIS — D509 Iron deficiency anemia, unspecified: Secondary | ICD-10-CM | POA: Diagnosis not present

## 2024-06-25 MED ORDER — IRON SUCROSE 200 MG IVPB - SIMPLE MED
200.0000 mg | Freq: Once | Status: AC
  Administered 2024-06-25: 200 mg via INTRAVENOUS

## 2024-06-25 MED ORDER — DIPHENHYDRAMINE HCL 25 MG PO CAPS
ORAL_CAPSULE | ORAL | Status: AC
  Filled 2024-06-25: qty 1

## 2024-06-25 MED ORDER — IRON SUCROSE 200 MG IVPB - SIMPLE MED
Status: AC
  Filled 2024-06-25: qty 110

## 2024-06-25 MED ORDER — ACETAMINOPHEN 325 MG PO TABS
ORAL_TABLET | ORAL | Status: AC
  Filled 2024-06-25: qty 2

## 2024-06-25 MED ORDER — DIPHENHYDRAMINE HCL 25 MG PO CAPS
25.0000 mg | ORAL_CAPSULE | Freq: Once | ORAL | Status: AC
  Administered 2024-06-25: 25 mg via ORAL

## 2024-06-25 MED ORDER — ACETAMINOPHEN 325 MG PO TABS
650.0000 mg | ORAL_TABLET | Freq: Once | ORAL | Status: AC
  Administered 2024-06-25: 650 mg via ORAL

## 2024-06-27 ENCOUNTER — Encounter (HOSPITAL_COMMUNITY)
Admission: RE | Admit: 2024-06-27 | Discharge: 2024-06-27 | Disposition: A | Discharge status: Home / Self Care | Source: Ambulatory Visit | Attending: Physician Assistant | Admitting: Physician Assistant

## 2024-06-27 VITALS — BP 144/68 | HR 104 | Temp 98.2°F | Resp 16

## 2024-06-27 DIAGNOSIS — D509 Iron deficiency anemia, unspecified: Secondary | ICD-10-CM | POA: Diagnosis not present

## 2024-06-27 MED ORDER — IRON SUCROSE 200 MG IVPB - SIMPLE MED
Status: AC
  Filled 2024-06-27: qty 110

## 2024-06-27 MED ORDER — ACETAMINOPHEN 325 MG PO TABS
650.0000 mg | ORAL_TABLET | Freq: Once | ORAL | Status: AC
  Administered 2024-06-27: 650 mg via ORAL

## 2024-06-27 MED ORDER — ACETAMINOPHEN 325 MG PO TABS
ORAL_TABLET | ORAL | Status: AC
  Filled 2024-06-27: qty 2

## 2024-06-27 MED ORDER — DIPHENHYDRAMINE HCL 25 MG PO CAPS
25.0000 mg | ORAL_CAPSULE | Freq: Once | ORAL | Status: AC
  Administered 2024-06-27: 25 mg via ORAL

## 2024-06-27 MED ORDER — DIPHENHYDRAMINE HCL 25 MG PO CAPS
ORAL_CAPSULE | ORAL | Status: AC
  Filled 2024-06-27: qty 1

## 2024-06-27 MED ORDER — IRON SUCROSE 200 MG IVPB - SIMPLE MED
200.0000 mg | Freq: Once | Status: AC
  Administered 2024-06-27: 200 mg via INTRAVENOUS

## 2024-06-30 ENCOUNTER — Ambulatory Visit (HOSPITAL_COMMUNITY)
Admission: RE | Admit: 2024-06-30 | Discharge: 2024-06-30 | Disposition: A | Discharge status: Home / Self Care | Source: Ambulatory Visit | Attending: Physician Assistant | Admitting: Physician Assistant

## 2024-06-30 VITALS — BP 113/69 | HR 99 | Temp 98.3°F | Resp 15

## 2024-06-30 DIAGNOSIS — D509 Iron deficiency anemia, unspecified: Secondary | ICD-10-CM | POA: Diagnosis present

## 2024-06-30 MED ORDER — ACETAMINOPHEN 325 MG PO TABS
ORAL_TABLET | ORAL | Status: AC
  Filled 2024-06-30: qty 2

## 2024-06-30 MED ORDER — IRON SUCROSE 200 MG IVPB - SIMPLE MED
Status: AC
  Filled 2024-06-30: qty 110

## 2024-06-30 MED ORDER — ACETAMINOPHEN 325 MG PO TABS
650.0000 mg | ORAL_TABLET | Freq: Once | ORAL | Status: AC
  Administered 2024-06-30: 650 mg via ORAL

## 2024-06-30 MED ORDER — IRON SUCROSE 200 MG IVPB - SIMPLE MED
200.0000 mg | Freq: Once | Status: AC
  Administered 2024-06-30: 200 mg via INTRAVENOUS

## 2024-06-30 MED ORDER — DIPHENHYDRAMINE HCL 25 MG PO CAPS
ORAL_CAPSULE | ORAL | Status: AC
  Filled 2024-06-30: qty 1

## 2024-06-30 MED ORDER — DIPHENHYDRAMINE HCL 25 MG PO CAPS
25.0000 mg | ORAL_CAPSULE | Freq: Once | ORAL | Status: AC
  Administered 2024-06-30: 25 mg via ORAL

## 2024-07-01 ENCOUNTER — Ambulatory Visit: Payer: Self-pay

## 2024-07-01 DIAGNOSIS — E119 Type 2 diabetes mellitus without complications: Secondary | ICD-10-CM | POA: Diagnosis not present

## 2024-07-01 NOTE — Progress Notes (Signed)
 "  07/01/2024 Name: Riley Clarke MRN: [id number] DOB: 1956/02/18  Chief Complaint  Patient presents with   Diabetes    Riley Clarke is a 69 y.o. year old male who was referred for medication management by their primary care provider, Riley Bascom RAMAN, NP. They presented for a face to face visit today.   They were referred to the pharmacist by their PCP for assistance in managing diabetes, hypertension, and hyperlipidemia . PMH includes HTN, T2DM, BMI > 30, opioid dependence for chronic pain.   Subjective: Patient was seen by PCP, [name], on 10/15/23. BP was 135/66, HR 90. Patient reported taking his medications as prescribed. A1C had increased from 8.1% to 10.3%. Patient was seen by pharmacy in person on 11/14/23. He could not identify a specific cause for his recent increase in A1C. He was instructed to stop linaglipitin, stop Trulicity , and start Mounjaro . He was instructed to continue metformin  and Jardiance . He was seen for follow-up in person on 12/26/23 and reported that he started Mounjaro  but stopped taking metformin  and Jardiance . His BG continued to be very elevated. He was instructed to restart his medications and continue titrating Mounjaro . He was then hospitalized at [facility] from 7/22 to 7/31 for DKA in the setting of GI illness (slalmonella, EPEC, ETEC+). At discharge, he was instructed to stop Jardiance , metformin , and Mounjaro  and start Lantus  30 units daily and Humalog  2 units TID with meals + SS. At PCP f/u on 01/16/24, A1C had increased to 11.7%. At pharmacy appt in person on 02/26/24, pt was taking Lantus  and Humalog  and had self-resumed Jardiance . He was not willing to restart Mounjaro  - feels that this led to hospitalization, despite explaining results of GI panel. We increased his Lantus  to 36 units (titrate up to 42) and increased Humalog  to 2 units with meals + sliding scale correction. At pharmacy appt on 03/25/24, patient reported continuing Lantus  30 units daily. Averaging Humalog   4-6 units with each meal. He was instructed to adjust Humalog  sliding scale to provide at least 2 units with each meal PLUS extra per scale. A1C at PCP appt on 04/30/24 was 10.1%. At pharmacy appt on 05/28/24, patient had not made changes in insulin  doses previously discussed. Again, counseled on importance of blood sugar control.   Today, patient reports that he is doing ok. He continues to take Lantus  30 units daily and Humalog  2-4 units at 8AM, 3PM, and 10PM (regardless of whether he is eating or not). States that the regimens I have recommended for him do not align with his eating habits. He states he only looks at his CGM to see if he is low enough to cheat with his foods. States he is not concerned about his A1C or blood sugar control, and that he would prefer to continue seeing regular provider, rather than have appts focused on his diabetes.   Care Team: Primary Care Provider: [name], NP ; Next Scheduled Visit: 07/31/24   Medication Access/Adherence  Current Pharmacy:  [facility] [id number] [place], [place] - [address] [place] [address] Phone: [phone] Fax: [phone]  [id number] [place], [place] - [address] AT [facility] [address] [place] [address] Phone: [phone] Fax: [phone]   Patient reports affordability concerns with their medications: No  - [employer] Dual Complete Patient reports access/transportation concerns to their pharmacy: No  Patient reports adherence concerns with  their medications:  No    Diabetes:  Current medications: Jardiance  25 mg daily, Lantus  36 units daily (taking 30 units daily), Humalog  8 units TID with meals + SS (<150= 0 unit; BG 150-200= 1 unit; BG 201-250= 2 unit; BG 251-300= 3 unit; BG 301-350= 4 unit; BG 351-400= 5 unit; BG >400= 6 unit ) (patient taking 2-4 units at 8AM, 3PM, and 10PM, regardless of  meals)  Medications tried in the past: metformin  IR 1000 mg BID with meals,  Mounjaro  5 mg weekly (Stopped after hospitalization for GI illness and DKA)  Current glucose readings: Has FL3+ reader with him today Has Accu Chek Guide meter as backup       Current meal patterns: states he eats when he is hungry. Will not discuss diet today. Food recall from previous visit included below. - Breakfast: bowl of cereal, bacon-egg sandwich,  - Supper: brown rice and chicken. Large portion of rice - Snacks: potato chips, popcorn - Drinks: sugar free lemonade, drinking 5-6x 16 oz water  bottle during the day, had soda, sweet tea recently   Current physical activity: occasionally goes out and walk  Current medication access support: none- [employer] Dual Complete  Hypertension:  Current medications: lisinopril -hydrochlorothiazide  20-25 mg daily Medications previously tried: n/a  Patient has a automated, upper arm home BP cuff - not checking regularly at home   Hyperlipidemia/ASCVD Risk Reduction  Current lipid lowering medications: simvastatin  10 mg daily  Medications tried in the past: N/A  Antiplatelet regimen: N/A  ASCVD History: N/A Family History: heart disease in mother Risk Factors: T2DM, HTN, family history   Clinical ASCVD: No  The ASCVD Risk score (Arnett DK, et al., 2019) failed to calculate for the following reasons:   The valid total cholesterol range is 130 to 320 mg/dL    Objective:  BP Readings from Last 3 Encounters:  06/30/24 113/69  06/27/24 (!) 144/68  06/25/24 129/64    Lab Results  Component Value Date   HGBA1C 10.1 (A) 04/30/2024   HGBA1C 11.7 (A) 01/16/2024   HGBA1C 10.3 (A) 10/15/2023    Lab Results  Component Value Date   CREATININE 0.87 06/10/2024   BUN 15 06/10/2024   NA 136 06/10/2024   K 3.9 06/10/2024   CL 100 06/10/2024   CO2 23 06/10/2024    Lab Results  Component Value Date   CHOL 93 (L) 10/15/2023   HDL 49 10/15/2023   LDLCALC  22 10/15/2023   TRIG 129 10/15/2023   CHOLHDL 1.9 10/15/2023    Medications Reviewed Today     Reviewed by Brinda Lorain SQUIBB, RPH-CPP (Pharmacist) on 07/01/24 at 1326  Med List Status: <None>   Medication Order Taking? Sig Documenting Provider Last Dose Status Informant  Accu-Chek Softclix Lancets lancets [id number]  Use 3 (three) times daily as directed to check blood sugar Amin, Burgess BROCKS, MD  Active Self, Pharmacy Records  Blood Glucose Monitoring Suppl (BLOOD GLUCOSE MONITOR SYSTEM) w/Device KIT [id number]  Use 3 (three) times daily. Caleen Burgess BROCKS, MD  Active Self, Pharmacy Records  Continuous Glucose Receiver (FREESTYLE LIBRE 3 READER) DEVI [id number]  Use 4 (four) times daily. Caleen Burgess BROCKS, MD  Active Self, Pharmacy Records  Continuous Glucose Sensor (FREESTYLE LIBRE 3 PLUS SENSOR) [place] [id number]  Change sensor every 15 days. Riley Bascom RAMAN, NP  Active Self, Pharmacy Records  cyclobenzaprine  (FLEXERIL ) 10 MG tablet [id number]  Take 1 tablet (10 mg total) by mouth 3 (three) times daily as needed for muscle spasms. Riley,  Bascom RAMAN, NP  Active Self, Pharmacy Records  empagliflozin  (JARDIANCE ) 25 MG TABS tablet [id number]  Take 1 tablet (25 mg total) by mouth daily. Riley Bascom RAMAN, NP  Active Self, Pharmacy Records  esomeprazole  (NEXIUM ) 40 MG capsule [id number]  TAKE 1 CAPSULE BY MOUTH EVERY MORNING BEFORE BREAKFAST Riley Bascom RAMAN, NP  Active   famotidine  (PEPCID ) 20 MG tablet [id number]  Take 1 tablet (20 mg total) by mouth 2 (two) times daily. Lang Norleen POUR, PA-C  Expired 06/23/24 2359   gabapentin  (NEURONTIN ) 300 MG capsule [id number]  Take 1 capsule (300 mg total) by mouth 3 (three) times daily. Riley Bascom RAMAN, NP  Active   insulin  glargine (LANTUS ) 100 UNIT/ML Solostar Pen [id number] Yes Inject 36 Units into the skin daily. May titrate up to 42 units daily as directed by your provider.  Patient taking differently: Inject 30 Units into the skin daily. May titrate up to 42 units daily as  directed by your provider.   Riley Bascom RAMAN, NP  Active Self, Pharmacy Records           Med Note RUMALDO DIAMOND FORBES Pablo May 12, 2024 11:49 AM) 15 units   insulin  lispro (HUMALOG ) 100 UNIT/ML KwikPen [id number] Yes Inject 8 units three times daily before meals. Riley Bascom RAMAN, NP  Active   Insulin  Pen Needle 32G X 4 MM MISC [id number]  Use as directed to inject insulin  up to 4 times per day Riley Bascom RAMAN, NP  Active   lisinopril -hydrochlorothiazide  (ZESTORETIC ) 20-25 MG tablet [id number]  Take 1 tablet by mouth daily. Riley Bascom RAMAN, NP  Active Self, Pharmacy Records  oxyCODONE -acetaminophen  (PERCOCET) 10-325 MG tablet [id number]  Take 1 tablet by mouth every 4 (four) hours as needed for pain. Joshua Alm Hamilton, MD  Active   simvastatin  (ZOCOR ) 10 MG tablet [id number]  TAKE 1 TABLET(10 MG) BY MOUTH DAILY Riley Bascom RAMAN, NP  Active               Assessment/Plan:   Diabetes: - Currently uncontrolled with last A1C of 10.1% uncontrolled above goal < 7%. TIR uncontrolled at 38% below goal > 70%. For our past three meetings, patient has not made recommended changes in insulin  doses. States he is not concerned about his glycemic control, thinks he is fine. Attempted to discuss long term benefits of obtaining glycemic control and A1C < 7%, but patient was not engaged in conversation and elected to end appointment early. He will follow-up with PCP as scheduled.   Unable to discuss other disease states today due to patient leaving appointment early.   Follow Up Plan:  PCP 07/31/24,  PharmD follow-up not scheduled per patient preference  Lorain Baseman, PharmD [facility] [phone]  "

## 2024-07-02 ENCOUNTER — Inpatient Hospital Stay (HOSPITAL_COMMUNITY)
Admission: RE | Admit: 2024-07-02 | Discharge: 2024-07-02 | Disposition: A | Source: Ambulatory Visit | Attending: Physician Assistant

## 2024-07-02 VITALS — BP 129/63 | HR 83 | Temp 98.7°F | Resp 16

## 2024-07-02 DIAGNOSIS — D509 Iron deficiency anemia, unspecified: Secondary | ICD-10-CM | POA: Diagnosis not present

## 2024-07-02 MED ORDER — DIPHENHYDRAMINE HCL 25 MG PO CAPS
25.0000 mg | ORAL_CAPSULE | Freq: Once | ORAL | Status: AC
  Administered 2024-07-02: 25 mg via ORAL

## 2024-07-02 MED ORDER — IRON SUCROSE 200 MG IVPB - SIMPLE MED
200.0000 mg | Freq: Once | Status: AC
  Administered 2024-07-02: 200 mg via INTRAVENOUS

## 2024-07-02 MED ORDER — ACETAMINOPHEN 325 MG PO TABS
ORAL_TABLET | ORAL | Status: AC
  Filled 2024-07-02: qty 2

## 2024-07-02 MED ORDER — DIPHENHYDRAMINE HCL 25 MG PO CAPS
ORAL_CAPSULE | ORAL | Status: AC
  Filled 2024-07-02: qty 1

## 2024-07-02 MED ORDER — ACETAMINOPHEN 325 MG PO TABS
650.0000 mg | ORAL_TABLET | Freq: Once | ORAL | Status: AC
  Administered 2024-07-02: 650 mg via ORAL

## 2024-07-02 MED ORDER — IRON SUCROSE 200 MG IVPB - SIMPLE MED
Status: AC
  Filled 2024-07-02: qty 110

## 2024-07-04 ENCOUNTER — Other Ambulatory Visit: Payer: Self-pay | Admitting: Nurse Practitioner

## 2024-07-04 NOTE — Telephone Encounter (Signed)
 gabapentin  (NEURONTIN ) 300 MG capsule [Pharmacy Med Name: GABAPENTIN  300MG  CAPSULES]

## 2024-07-04 NOTE — Telephone Encounter (Signed)
 Please Advise.  Pt is switching pharmacies.

## 2024-07-15 ENCOUNTER — Encounter: Admitting: Gastroenterology

## 2024-07-31 ENCOUNTER — Ambulatory Visit: Payer: Self-pay | Admitting: Nurse Practitioner

## 2024-08-28 ENCOUNTER — Encounter: Admitting: Gastroenterology

## 2024-09-08 ENCOUNTER — Inpatient Hospital Stay: Admitting: Physician Assistant

## 2024-09-08 ENCOUNTER — Inpatient Hospital Stay
